# Patient Record
Sex: Female | Born: 1955 | Race: White | Hispanic: No | Smoking: Current every day smoker
Health system: Southern US, Community
[De-identification: ages and names within clinical notes are randomized; demographics above are authoritative.]

## PROBLEM LIST (undated history)

## (undated) DIAGNOSIS — F32A Depression, unspecified: Secondary | ICD-10-CM

## (undated) DIAGNOSIS — I2510 Atherosclerotic heart disease of native coronary artery without angina pectoris: Secondary | ICD-10-CM

## (undated) DIAGNOSIS — M1990 Unspecified osteoarthritis, unspecified site: Secondary | ICD-10-CM

## (undated) DIAGNOSIS — R55 Syncope and collapse: Secondary | ICD-10-CM

## (undated) DIAGNOSIS — Z85048 Personal history of other malignant neoplasm of rectum, rectosigmoid junction, and anus: Secondary | ICD-10-CM

## (undated) DIAGNOSIS — E041 Nontoxic single thyroid nodule: Secondary | ICD-10-CM

## (undated) DIAGNOSIS — I639 Cerebral infarction, unspecified: Secondary | ICD-10-CM

## (undated) DIAGNOSIS — R0789 Other chest pain: Secondary | ICD-10-CM

## (undated) DIAGNOSIS — Z95828 Presence of other vascular implants and grafts: Secondary | ICD-10-CM

## (undated) DIAGNOSIS — R519 Headache, unspecified: Secondary | ICD-10-CM

## (undated) DIAGNOSIS — Z933 Colostomy status: Secondary | ICD-10-CM

## (undated) DIAGNOSIS — I209 Angina pectoris, unspecified: Secondary | ICD-10-CM

## (undated) DIAGNOSIS — Z923 Personal history of irradiation: Secondary | ICD-10-CM

## (undated) DIAGNOSIS — I10 Essential (primary) hypertension: Secondary | ICD-10-CM

## (undated) DIAGNOSIS — Z6824 Body mass index (BMI) 24.0-24.9, adult: Secondary | ICD-10-CM

## (undated) DIAGNOSIS — Z9221 Personal history of antineoplastic chemotherapy: Secondary | ICD-10-CM

## (undated) DIAGNOSIS — K219 Gastro-esophageal reflux disease without esophagitis: Secondary | ICD-10-CM

## (undated) DIAGNOSIS — R51 Headache: Secondary | ICD-10-CM

## (undated) DIAGNOSIS — C20 Malignant neoplasm of rectum: Secondary | ICD-10-CM

## (undated) DIAGNOSIS — Z87440 Personal history of urinary (tract) infections: Secondary | ICD-10-CM

## (undated) DIAGNOSIS — F17200 Nicotine dependence, unspecified, uncomplicated: Secondary | ICD-10-CM

## (undated) HISTORY — DX: Essential (primary) hypertension: I10

## (undated) HISTORY — DX: Nontoxic single thyroid nodule: E04.1

## (undated) HISTORY — PX: PARTIAL HYSTERECTOMY: SHX80

## (undated) HISTORY — DX: Personal history of urinary (tract) infections: Z87.440

## (undated) HISTORY — DX: Nicotine dependence, unspecified, uncomplicated: F17.200

## (undated) HISTORY — PX: COLON SURGERY: SHX602

## (undated) HISTORY — PX: ABDOMINAL HYSTERECTOMY: SHX81

## (undated) HISTORY — DX: Other chest pain: R07.89

## (undated) HISTORY — DX: Personal history of other malignant neoplasm of rectum, rectosigmoid junction, and anus: Z85.048

## (undated) HISTORY — PX: CHOLECYSTECTOMY: SHX55

## (undated) HISTORY — DX: Colostomy status: Z93.3

## (undated) HISTORY — PX: ERCP: SHX60

## (undated) HISTORY — DX: Syncope and collapse: R55

## (undated) HISTORY — PX: BACK SURGERY: SHX140

---
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
## 1981-12-11 HISTORY — PX: CHOLECYSTECTOMY: SHX55

## 1997-12-11 HISTORY — PX: ABDOMINAL HYSTERECTOMY: SHX81

## 1998-12-11 HISTORY — PX: BACK SURGERY: SHX140

## 2001-05-23 ENCOUNTER — Encounter: Payer: Self-pay | Admitting: Neurosurgery

## 2001-05-23 ENCOUNTER — Ambulatory Visit (HOSPITAL_COMMUNITY): Admission: RE | Admit: 2001-05-23 | Discharge: 2001-05-23 | Payer: Self-pay | Admitting: Neurosurgery

## 2001-06-20 ENCOUNTER — Ambulatory Visit (HOSPITAL_COMMUNITY): Admission: RE | Admit: 2001-06-20 | Discharge: 2001-06-21 | Payer: Self-pay | Admitting: Neurosurgery

## 2001-06-20 ENCOUNTER — Encounter: Payer: Self-pay | Admitting: Neurosurgery

## 2005-01-16 ENCOUNTER — Emergency Department: Payer: Self-pay | Admitting: Emergency Medicine

## 2011-07-04 ENCOUNTER — Ambulatory Visit: Payer: Self-pay | Admitting: Family Medicine

## 2012-01-23 ENCOUNTER — Emergency Department: Payer: Self-pay | Admitting: Emergency Medicine

## 2012-01-23 LAB — CBC
HGB: 15.1 g/dL (ref 12.0–16.0)
MCHC: 33.7 g/dL (ref 32.0–36.0)
MCV: 89 fL (ref 80–100)
Platelet: 92 10*3/uL — ABNORMAL LOW (ref 150–440)
RBC: 5.03 10*6/uL (ref 3.80–5.20)
RDW: 14.2 % (ref 11.5–14.5)
WBC: 4.2 10*3/uL (ref 3.6–11.0)

## 2012-01-23 LAB — CSF CELL COUNT WITH DIFFERENTIAL
CSF Tube #: 3
Lymphocytes: 0 %
Other Cells: 0 %
WBC (CSF): 0 /mm3

## 2012-01-23 LAB — BASIC METABOLIC PANEL
Anion Gap: 9 (ref 7–16)
Co2: 29 mmol/L (ref 21–32)
Creatinine: 0.73 mg/dL (ref 0.60–1.30)
EGFR (African American): >60
EGFR (Non-African Amer.): >60
Glucose: 107 mg/dL — ABNORMAL HIGH (ref 65–99)
Potassium: 3.2 mmol/L — ABNORMAL LOW (ref 3.5–5.1)
Sodium: 135 mmol/L — ABNORMAL LOW (ref 136–145)

## 2012-01-23 LAB — TROPONIN I: Troponin-I: <0.02 ng/mL

## 2012-01-27 LAB — CSF CULTURE W GRAM STAIN

## 2012-01-29 LAB — CULTURE, BLOOD (SINGLE)

## 2012-12-13 DIAGNOSIS — R519 Headache, unspecified: Secondary | ICD-10-CM | POA: Insufficient documentation

## 2014-04-29 ENCOUNTER — Emergency Department: Payer: Self-pay | Admitting: Emergency Medicine

## 2014-04-29 ENCOUNTER — Telehealth: Payer: Self-pay

## 2014-04-29 LAB — BASIC METABOLIC PANEL
ANION GAP: 4 — AB (ref 7–16)
BUN: 13 mg/dL (ref 7–18)
CHLORIDE: 104 mmol/L (ref 98–107)
Calcium, Total: 9.6 mg/dL (ref 8.5–10.1)
Co2: 33 mmol/L — ABNORMAL HIGH (ref 21–32)
Creatinine: 0.64 mg/dL (ref 0.60–1.30)
EGFR (African American): >60
EGFR (Non-African Amer.): >60
Glucose: 100 mg/dL — ABNORMAL HIGH (ref 65–99)
OSMOLALITY: 281 (ref 275–301)
Potassium: 3.5 mmol/L (ref 3.5–5.1)
SODIUM: 141 mmol/L (ref 136–145)

## 2014-04-29 LAB — CBC
HCT: 44.4 % (ref 35.0–47.0)
HGB: 14.5 g/dL (ref 12.0–16.0)
MCH: 29.3 pg (ref 26.0–34.0)
MCHC: 32.7 g/dL (ref 32.0–36.0)
MCV: 90 fL (ref 80–100)
PLATELETS: 166 10*3/uL (ref 150–440)
RBC: 4.94 10*6/uL (ref 3.80–5.20)
RDW: 14.2 % (ref 11.5–14.5)
WBC: 8.5 10*3/uL (ref 3.6–11.0)

## 2014-04-29 LAB — TROPONIN I: Troponin-I: <0.02 ng/mL

## 2014-04-29 NOTE — Telephone Encounter (Signed)
Attempted to contact pt regarding visit to [facility] ED today for chest pain.  Left message for pt to call back to schedule new pt appt.

## 2014-05-06 ENCOUNTER — Encounter: Payer: Self-pay | Admitting: Cardiology

## 2014-05-06 ENCOUNTER — Ambulatory Visit (INDEPENDENT_AMBULATORY_CARE_PROVIDER_SITE_OTHER): Payer: BC Managed Care – PPO | Admitting: Cardiology

## 2014-05-06 ENCOUNTER — Encounter (INDEPENDENT_AMBULATORY_CARE_PROVIDER_SITE_OTHER): Payer: Self-pay

## 2014-05-06 VITALS — BP 154/103 | HR 70 | Ht 66.0 in | Wt 155.0 lb

## 2014-05-06 DIAGNOSIS — I10 Essential (primary) hypertension: Secondary | ICD-10-CM

## 2014-05-06 DIAGNOSIS — R000 Tachycardia, unspecified: Secondary | ICD-10-CM

## 2014-05-06 DIAGNOSIS — R0789 Other chest pain: Secondary | ICD-10-CM

## 2014-05-06 DIAGNOSIS — R079 Chest pain, unspecified: Secondary | ICD-10-CM

## 2014-05-06 DIAGNOSIS — F17200 Nicotine dependence, unspecified, uncomplicated: Secondary | ICD-10-CM

## 2014-05-06 NOTE — Patient Instructions (Signed)
Your physician has requested that you have a stress echocardiogram. For further information please visit [email or web address]. Please follow instruction sheet as given.  Your physician recommends that you schedule a follow-up appointment in:  4-6 weeks with Dr. Ellyn Hack

## 2014-05-08 ENCOUNTER — Encounter: Payer: Self-pay | Admitting: Cardiology

## 2014-05-08 DIAGNOSIS — Z87891 Personal history of nicotine dependence: Secondary | ICD-10-CM | POA: Insufficient documentation

## 2014-05-08 DIAGNOSIS — R079 Chest pain, unspecified: Secondary | ICD-10-CM | POA: Insufficient documentation

## 2014-05-08 DIAGNOSIS — R000 Tachycardia, unspecified: Secondary | ICD-10-CM | POA: Insufficient documentation

## 2014-05-08 DIAGNOSIS — I10 Essential (primary) hypertension: Secondary | ICD-10-CM | POA: Insufficient documentation

## 2014-05-08 NOTE — Assessment & Plan Note (Addendum)
She has been able to Quit in the past, but has always had a relapse. Counseling given - QuitNow information provided.

## 2014-05-08 NOTE — Assessment & Plan Note (Signed)
Probably exercise mediated. No syncope.

## 2014-05-08 NOTE — Assessment & Plan Note (Signed)
Based upon readings from home - BPs are borderline to elevated.   Will need to address, but after assessment for ischmia.

## 2014-05-08 NOTE — Assessment & Plan Note (Signed)
-   Both typical & atypical features for Angina.  Prolonged episodes & now with fatigue - ruled out for MI though.  Plan: TM Stress Echo - to allow measure baseline cardiac function & evaluate for ischemic vs. Structural abnormalities.

## 2014-05-08 NOTE — Progress Notes (Signed)
PATIENT: Rachel [name] MRN: [id number] DOB: 08/30/1956 PCP: Lynnell Jude, MD  Clinic Note: Chief Complaint  Patient presents with  . OTHER    F/u from [facility] c/o chest discomfort, rapid heart beat, sob and elevated BP. Meds reviewed verbally with pt.    HPI: Rachel [name] is a 58 y.o. female with a PMH below -- HTN & smoking who presents today for evaluation of chest pain.  Interval History: Rachel [name] is referred for cardiology evaluation following an ER visit to [facility] ER with CP on 04/29/2014. She describes having brief episodes of a ~moderate sensation of "squeezing" discomfort in her chest (Sub sternal radiating to L arm & jaw) that happens sporadically and resolves spontaneously.  The episodes are not associated with any particular activity - happening both @ rest & with exertion.  Until the 20th, the "spells" were relatively short-lived, but on that day, the discomfort did not go away after > 1 hr and she noted having a bit of SOB associated with it.  It hurt more to take a deep breath.  The duration (over 1 hr) & association with dyspnea is made her decide to go to the ER.  She was r/o for MI, but noted to be significantly hypertensive -- a new finding for her.   Since that episode, she has felt "totally drained" & fatigued.  She notes decreased exercise tolerance - but is not sure if this is a real symptom or simply being scared.  She has not had any of the chest discomfort symptoms in ~2-3 days now.  BPs have been elevated more since the episodes -- 120s-160s; has also noted HR will go up & down more.  CV ROS: No PND, orthopnea or edema. No palpitations, lightheadedness, dizziness, weakness or syncope/near syncope. No TIA/amaurosis fugax symptoms. No melena, hematochezia, hematuria, or epstaxis. No claudication.  Past Medical History  Diagnosis Date  . Syncope and collapse   . UTI (urinary tract infection)     Treated  . Hypertension   . Current every day smoker     smokeer for ~35  years - > has cut down to < 3/4 ppd    Prior Cardiac Evaluation and Past Surgical History: Past Surgical History  Procedure Laterality Date  . Cholecystectomy    . Partial hysterectomy    . Back surgery      No Known Allergies  No current outpatient prescriptions on file.   No current facility-administered medications for this visit.    History   Social History Narrative   Married.   Works for [employer].   Smokes ~3/4 ppd    family history includes Heart disease in her paternal grandmother; Hyperlipidemia in her father.  ROS: A comprehensive Review of Systems - Negative except GERD symptoms - but her current symptoms are not at all similar to her GERD.  she also notes a swollen "gland" on her R neck.    PHYSICAL EXAM BP 154/103  Pulse 70  Ht 5\' 6"  (1.676 m)  Wt 155 lb (70.308 kg)  BMI 25.03 kg/m2 General appearance: alert, cooperative, appears stated age, no distress and well nourished; healthy appearing; Normal Mood & afffect. HEENT: [place]/AT, EOMI, MMM, anicteric sclera Neck: no carotid bruit, no JVD, supple, symmetrical, trachea midline and R side mid neck - unsue if Node vs. thyroid nodule - 0.5-1 cm swollen & tender lump. Lungs: clear to auscultation bilaterally, normal percussion bilaterally and non-labored, good air movement Heart: regular rate and rhythm, S1, S2  normal, no murmur, click, rub or gallop, normal apical impulse and occasional ectopy Abdomen: soft, non-tender; bowel sounds normal; no masses,  no organomegaly Extremities: extremities normal, atraumatic, no cyanosis or edema - mild varicose veins without stasis dermatitis changes Pulses: 2+ and symmetric Neurologic: Alert and oriented X 3, normal strength and tone. Normal symmetric reflexes. Normal coordination and gait   Adult ECG Report  Rate: 70 ;  Rhythm: normal sinus rhythm; LAD (-36) - otherwise normal.  Recent Labs: Labs from [place] normal.  ASSESSMENT / PLAN: 58 y/o woman  with CRFs of HTN & smoking referred for chest "pain" that has both typical & atypical features.   Chest pain with moderate risk for cardiac etiology - Both typical & atypical features for Angina.  Prolonged episodes & now with fatigue - ruled out for MI though.  Plan: TM Stress Echo - to allow measure baseline cardiac function & evaluate for ischemic vs. Structural abnormalities.  Hypertension Based upon readings from home - BPs are borderline to elevated.   Will need to address, but after assessment for ischmia.  Current every day smoker She has been able to Quit in the past, but has always had a relapse. Counseling given - QuitNow information provided.  Rapid heart beat Probably exercise mediated. No syncope.    Orders Placed This Encounter  Procedures  . EKG 12-Lead    Order Specific Question:  Where should this test be performed    Answer:  [facility]  . Echocardiogram stress test without contrast    Standing Status: Future     Number of Occurrences:      Standing Expiration Date: 05/06/2015    Order Specific Question:  Type of Echo    Answer:  Complete    Order Specific Question:  Where should this test be performed    Answer:  [facility]    Order Specific Question:  Reason for exam-Echo    Answer:  Chest Pain  786.50   No orders of the defined types were placed in this encounter.    Followup: 1 months  Gerldine Suleiman W. Ellyn Hack, M.D., M.S. Interventional Cardiolgy [facility]

## 2014-05-21 ENCOUNTER — Other Ambulatory Visit (INDEPENDENT_AMBULATORY_CARE_PROVIDER_SITE_OTHER): Payer: BC Managed Care – PPO

## 2014-05-21 DIAGNOSIS — R079 Chest pain, unspecified: Secondary | ICD-10-CM

## 2014-05-29 NOTE — Progress Notes (Signed)
Quick Note:  Exercise Echo ST - god effort. The test was abnormal though & suggests possible anterior wall ischemia.  Probably need to see back soon & set up for cath. If amenable, can have her come see NP/PA in order to discuss result & plan for cath. (I know that my time there is fleeting) This distribution of possible ischemia is concerning for LAD disease & warrants Cath.  Thanks, Leonie Man, MD  ______

## 2014-06-02 ENCOUNTER — Emergency Department: Payer: Self-pay | Admitting: Emergency Medicine

## 2014-06-03 ENCOUNTER — Encounter: Payer: Self-pay | Admitting: Nurse Practitioner

## 2014-06-03 ENCOUNTER — Ambulatory Visit (INDEPENDENT_AMBULATORY_CARE_PROVIDER_SITE_OTHER): Payer: BC Managed Care – PPO | Admitting: Nurse Practitioner

## 2014-06-03 VITALS — BP 178/100 | HR 65 | Ht 66.0 in | Wt 159.0 lb

## 2014-06-03 DIAGNOSIS — F17200 Nicotine dependence, unspecified, uncomplicated: Secondary | ICD-10-CM

## 2014-06-03 DIAGNOSIS — I10 Essential (primary) hypertension: Secondary | ICD-10-CM

## 2014-06-03 DIAGNOSIS — R072 Precordial pain: Secondary | ICD-10-CM

## 2014-06-03 DIAGNOSIS — R079 Chest pain, unspecified: Secondary | ICD-10-CM

## 2014-06-03 DIAGNOSIS — R0789 Other chest pain: Secondary | ICD-10-CM

## 2014-06-03 DIAGNOSIS — Z720 Tobacco use: Secondary | ICD-10-CM

## 2014-06-03 MED ORDER — NITROGLYCERIN 0.4 MG SL SUBL: 0.4000 mg | SUBLINGUAL_TABLET | SUBLINGUAL | Status: AC | PRN

## 2014-06-03 MED ORDER — METOPROLOL TARTRATE 12.5 MG HALF TABLET: 12.5000 mg | ORAL_TABLET | Freq: Two times a day (BID) | ORAL | Status: DC

## 2014-06-03 MED ORDER — METOPROLOL TARTRATE 25 MG PO TABS: 12.5000 mg | ORAL_TABLET | Freq: Two times a day (BID) | ORAL | Status: DC

## 2014-06-03 MED ORDER — ISOSORBIDE MONONITRATE ER 30 MG PO TB24: 30.0000 mg | ORAL_TABLET | Freq: Every day | ORAL | Status: DC

## 2014-06-03 MED ORDER — METOPROLOL TARTRATE 12.5 MG HALF TABLET: 25.0000 mg | ORAL_TABLET | Freq: Two times a day (BID) | ORAL | Status: DC

## 2014-06-03 NOTE — Progress Notes (Signed)
Patient Name: Rachel [name] Date of Encounter: 06/03/2014  Primary Care Provider:  Lynnell Jude, MD Primary Cardiologist:  Roni Bread   Patient Profile  58 y/o female with h/o chest pain dating back to May, who recently had an abnl stress echo and presents for clinic f/u.  Problem List   Past Medical History  Diagnosis Date  . Syncope and collapse   . History of UTI   . Hypertension   . Current every day smoker     a. ~35 years - > has cut down to < 3/4 ppd  . Midsternal chest pain     a. 05/2014 Stress Echo: Ex time: 8:09, ECG w/o acute changes, EF nl with possible mid and apical anterior HK-->cath recommended.   Past Surgical History  Procedure Laterality Date  . Cholecystectomy    . Partial hysterectomy    . Back surgery      Allergies  No Known Allergies  HPI  59 y/o female with the above problem list.  She has a h/o of brief, intermittent rest and exertional midsternal chest pain and squeezing radiating to the jaw and left arm, typically resolving spontaneously.  On 5/20, she had a more prolonged episode associated with dyspnea and presented to the [facility] ED where ECG was non-acute and troponin was normal.  She was hypertensive in the ED.  She was discharged home and followed up with Dr. Ellyn Hack on 5/27.  Recommendation was made for stress echo, which was performed on 6/11, revealing good exercise tolerance and normal LV function though mid and apical anterior hypokinesis with exercise could not be ruled out.  As a result, she presents for clinic f/u today to arrange for diagnostic catheterization.  Since her visit with Dr. Ellyn Hack, she has continued to have intermittent rest and exertional chest discomfort most days of the week.  Ss typically last about 5 mins and resolve spontaneously.  She has been following her BP @ home has been elevated with systolics typically in the 150's to 170's.  Yesterday she fell and had swelling of her left hand.  She was seen in the ED and  found to have a hamate bone fracture.  She was placed on pain medication and is awaiting ortho f/u.  She denies chest pain, palpitations, dyspnea, pnd, orthopnea, n, v, dizziness, syncope, edema, weight gain, or early satiety.   Home Medications  Prior to Admission medications   Not on File   Family History  Family History  Problem Relation Age of Onset  . Hyperlipidemia Father   . Heart disease Paternal Grandmother    Social History  History   Social History  . Marital Status: Married    Spouse Name: N/A    Number of Children: N/A  . Years of Education: N/A   Occupational History  . Not on file.   Social History Main Topics  . Smoking status: Current Every Day Smoker -- 0.25 packs/day for 35 years    Types: Cigarettes  . Smokeless tobacco: Not on file  . Alcohol Use: Yes     Comment: occasional  . Drug Use: No  . Sexual Activity: Not on file   Other Topics Concern  . Not on file   Social History Narrative   Married.   Works for [employer].   Smokes ~3/4 ppd     Review of Systems General:  No chills, fever, night sweats or weight changes.  Cardiovascular:  +++ chest pain as outlined above.  No dyspnea on exertion, edema, orthopnea, palpitations, paroxysmal nocturnal dyspnea. Dermatological: No rash, lesions/masses Respiratory: No cough, dyspnea Urologic: No hematuria, dysuria Abdominal:   No nausea, vomiting, diarrhea, bright red blood per rectum, melena, or hematemesis Neurologic:  No visual changes, wkns, changes in mental status. MSK:  Left hand swelling and pain s/p fall and fx. All other systems reviewed and are otherwise negative except as noted above.  Physical Exam  Blood pressure 178/100, pulse 65, height 5\' 6"  (1.676 m), weight 159 lb (72.122 kg).  General: Pleasant, NAD Psych: Normal affect. Neuro: Alert and oriented X 3. Moves all extremities spontaneously. HEENT: Normal  Neck: Supple without bruits or JVD. Lungs:  Resp regular and  unlabored, CTA. Heart: RRR no s3, s4, or murmurs. Abdomen: Soft, non-tender, non-distended, BS + x 4.  Extremities: No clubbing, cyanosis or edema. DP/PT/Radials 2+ and equal bilaterally.  No femoral bruits noted.  Left hand/forearm in soft splint.  Accessory Clinical Findings  ECG - RSR, 65, left axis, no acute st/t changes.  Assessment & Plan  1.  Midsternal chest pain:  Pt presents with a several month h/o intermittent rest and exertional midsternal chest discomfort w/o associated Ss, lasting ~ 5 mins and resolving spontaneously.  She recently underwent stress echo, which showed mid and apical anterior hypokinesis with exercise. We discussed pursuing diagnostic catheterization today. The patient understands that risks include but are not limited to stroke (1 in 1000), death (1 in 30), kidney failure [usually temporary] (1 in 500), bleeding (1 in 200), allergic reaction [possibly serious] (1 in 200), and agrees to proceed. I will add asa 81mg  daily, lopressor 12.5mg  bid, imdur 30mg  daily, and ntg sl 0.4mg  [place] prn chest pain.  She will be scheduled for cath for next Tuesday with Dr. Rockey Situ.  2.  HTN:  BP elevated in clinic today.  She says that it has been running in the 150's to 170's @ home.  Given chest pain, I will add low-dose bb (HR in mid-60's) and imdur first.  Pending cath results, we will reconsider her regimen.  She will continue to take her blood pressures daily and track @ home.  3.  Tobacco Abuse:  Complete cessation advised.  She says that she has cut way back and has only smoked one cigarette today.  4.  L Hamate bone fx:  Pending further ortho eval.  5.  Dispo:  Will obtain cbc, bmet, coags today in preparation for cath on Tuesday.  She had a cxr in May @ [facility].  F/U ~ 2 wks post-cath.  Murray Hodgkins, NP 06/03/2014, 2:09 PM

## 2014-06-03 NOTE — Patient Instructions (Addendum)
Please start Lopressor 12.5mg  twice daily Please start Imdur 30mg  once daily Please use nitro as needed for chest pain   Your physician recommends that you schedule a follow-up appointment in: 3 weeks  [facility] Cardiac Cath Instructions   You are scheduled for a Cardiac Cath on:____Tuesday, June 30_________  Please arrive at __7:30___am on the day of your procedure  You will need to pre-register prior to the day of your procedure.  Enter through the [employer] at [facility].  Registration is the first desk on your right.  Please take the procedure order we have given you in order to be registered appropriately  Do not eat/drink anything after midnight  Someone will need to drive you home  It is recommended someone be with you for the first 24 hours after your procedure  Wear clothes that are easy to get on/off and wear slip on shoes if possible  Medications bring a current list of all medications with you  _X_ You may take all of your medications the morning of your procedure with enough water to swallow safely  Day of your procedure: Arrive at the [place] entrance.  Free valet service is available.  After entering the [place] please check-in at the registration desk (1st desk on your right) to receive your armband. After receiving your armband someone will escort you to the cardiac cath/special procedures waiting area.  The usual length of stay after your procedure is about 2 to 3 hours.  This can vary.  If you have any questions, please call our office at [phone], or you may call the cardiac cath lab at [facility] directly at [phone]

## 2014-06-04 LAB — CBC WITH DIFFERENTIAL
Basophils Absolute: 0 10*3/uL (ref 0.0–0.2)
Basos: 0 %
Eos: 1 %
Eosinophils Absolute: 0.1 10*3/uL (ref 0.0–0.4)
HCT: 39 % (ref 34.0–46.6)
Hemoglobin: 13.2 g/dL (ref 11.1–15.9)
Immature Grans (Abs): 0 10*3/uL (ref 0.0–0.1)
Immature Granulocytes: 0 %
Lymphocytes Absolute: 2.4 10*3/uL (ref 0.7–3.1)
Lymphs: 33 %
MCH: 29.9 pg (ref 26.6–33.0)
MCHC: 33.8 g/dL (ref 31.5–35.7)
MCV: 88 fL (ref 79–97)
Monocytes Absolute: 0.4 10*3/uL (ref 0.1–0.9)
Monocytes: 5 %
Neutrophils Absolute: 4.4 10*3/uL (ref 1.4–7.0)
Neutrophils Relative %: 61 %
Platelets: 179 10*3/uL (ref 150–379)
RBC: 4.42 x10E6/uL (ref 3.77–5.28)
RDW: 14.4 % (ref 12.3–15.4)
WBC: 7.2 10*3/uL (ref 3.4–10.8)

## 2014-06-04 LAB — PROTIME-INR
INR: 1 (ref 0.8–1.2)
PROTHROMBIN TIME: 10 s (ref 9.1–12.0)

## 2014-06-05 ENCOUNTER — Telehealth: Payer: Self-pay

## 2014-06-05 LAB — BASIC METABOLIC PANEL: Calcium: 9.2 mg/dL (ref 8.7–10.2)

## 2014-06-05 NOTE — Telephone Encounter (Signed)
Spoke w/ pt.  Advised her that I would need to resched her cath for a day when Dr. Rockey Situ and Dr. Fletcher Anon are both in the hospital.  She is agreeable. Cath resched for 06/19/14 @ 8:30. Pt to call w/ further questions or concerns.

## 2014-06-10 NOTE — Telephone Encounter (Signed)
This encounter was created in error - please disregard.

## 2014-06-11 ENCOUNTER — Ambulatory Visit: Payer: BC Managed Care – PPO | Admitting: Nurse Practitioner

## 2014-06-19 ENCOUNTER — Ambulatory Visit: Payer: Self-pay | Admitting: Cardiovascular Disease

## 2014-06-19 DIAGNOSIS — I2510 Atherosclerotic heart disease of native coronary artery without angina pectoris: Secondary | ICD-10-CM

## 2014-06-24 ENCOUNTER — Ambulatory Visit: Payer: BC Managed Care – PPO | Admitting: Cardiology

## 2014-06-29 ENCOUNTER — Ambulatory Visit: Payer: BC Managed Care – PPO | Admitting: Cardiovascular Disease

## 2014-07-08 ENCOUNTER — Ambulatory Visit: Payer: BC Managed Care – PPO | Admitting: Cardiovascular Disease

## 2014-07-09 ENCOUNTER — Encounter: Payer: Self-pay | Admitting: Cardiovascular Disease

## 2014-07-22 ENCOUNTER — Inpatient Hospital Stay: Payer: Self-pay | Admitting: Internal Medicine

## 2014-07-22 DIAGNOSIS — E876 Hypokalemia: Secondary | ICD-10-CM

## 2014-07-22 DIAGNOSIS — K8050 Calculus of bile duct without cholangitis or cholecystitis without obstruction: Secondary | ICD-10-CM

## 2014-07-22 DIAGNOSIS — R079 Chest pain, unspecified: Secondary | ICD-10-CM

## 2014-07-22 LAB — COMPREHENSIVE METABOLIC PANEL
ALBUMIN: 3.3 g/dL — AB (ref 3.4–5.0)
ALT: 197 U/L — AB
ANION GAP: 10 (ref 7–16)
AST: 488 U/L — AB (ref 15–37)
Alkaline Phosphatase: 217 U/L — ABNORMAL HIGH
BUN: 11 mg/dL (ref 7–18)
Bilirubin,Total: 1.6 mg/dL — ABNORMAL HIGH (ref 0.2–1.0)
Calcium, Total: 8.8 mg/dL (ref 8.5–10.1)
Chloride: 103 mmol/L (ref 98–107)
Co2: 30 mmol/L (ref 21–32)
Creatinine: 0.84 mg/dL (ref 0.60–1.30)
EGFR (African American): >60
Glucose: 162 mg/dL — ABNORMAL HIGH (ref 65–99)
Osmolality: 288 (ref 275–301)
Potassium: 2.7 mmol/L — ABNORMAL LOW (ref 3.5–5.1)
Sodium: 143 mmol/L (ref 136–145)
TOTAL PROTEIN: 7.5 g/dL (ref 6.4–8.2)

## 2014-07-22 LAB — CK TOTAL AND CKMB (NOT AT ARMC)
CK, TOTAL: 107 U/L
CK, TOTAL: 85 U/L
CK, Total: 131 U/L
CK-MB: 1.3 ng/mL (ref 0.5–3.6)
CK-MB: 0.6 ng/mL (ref 0.5–3.6)
CK-MB: 0.7 ng/mL (ref 0.5–3.6)

## 2014-07-22 LAB — CBC
HCT: 43.1 % (ref 35.0–47.0)
HGB: 13.9 g/dL (ref 12.0–16.0)
MCH: 29.2 pg (ref 26.0–34.0)
MCHC: 32.4 g/dL (ref 32.0–36.0)
MCV: 90 fL (ref 80–100)
Platelet: 232 10*3/uL (ref 150–440)
RBC: 4.77 10*6/uL (ref 3.80–5.20)
RDW: 14.2 % (ref 11.5–14.5)
WBC: 18.5 10*3/uL — AB (ref 3.6–11.0)

## 2014-07-22 LAB — PROTIME-INR
INR: 1
Prothrombin Time: 12.9 secs (ref 11.5–14.7)

## 2014-07-22 LAB — TROPONIN I
Troponin-I: <0.02 ng/mL
Troponin-I: 0.06 ng/mL — ABNORMAL HIGH
Troponin-I: 0.05 ng/mL

## 2014-07-22 LAB — LIPASE, BLOOD: Lipase: 174 U/L (ref 73–393)

## 2014-07-22 LAB — APTT: Activated PTT: 27.2 secs (ref 23.6–35.9)

## 2014-07-22 LAB — PRO B NATRIURETIC PEPTIDE: B-Type Natriuretic Peptide: 1509 pg/mL — ABNORMAL HIGH (ref 0–125)

## 2014-07-23 LAB — CBC WITH DIFFERENTIAL/PLATELET
BASOS ABS: 0 10*3/uL (ref 0.0–0.1)
Basophil %: 0.3 %
EOS ABS: 0 10*3/uL (ref 0.0–0.7)
Eosinophil %: 0.1 %
HCT: 35.9 % (ref 35.0–47.0)
HGB: 11.6 g/dL — ABNORMAL LOW (ref 12.0–16.0)
Lymphocyte #: 0.9 10*3/uL — ABNORMAL LOW (ref 1.0–3.6)
Lymphocyte %: 6.7 %
MCH: 29.4 pg (ref 26.0–34.0)
MCHC: 32.4 g/dL (ref 32.0–36.0)
MCV: 91 fL (ref 80–100)
Monocyte #: 0.8 x10 3/mm (ref 0.2–0.9)
Monocyte %: 5.5 %
NEUTROS ABS: 12.2 10*3/uL — AB (ref 1.4–6.5)
NEUTROS PCT: 87.4 %
Platelet: 175 10*3/uL (ref 150–440)
RBC: 3.97 10*6/uL (ref 3.80–5.20)
RDW: 14.3 % (ref 11.5–14.5)
WBC: 14 10*3/uL — ABNORMAL HIGH (ref 3.6–11.0)

## 2014-07-23 LAB — COMPREHENSIVE METABOLIC PANEL
ALK PHOS: 180 U/L — AB
ANION GAP: 6 — AB (ref 7–16)
Albumin: 2.5 g/dL — ABNORMAL LOW (ref 3.4–5.0)
BILIRUBIN TOTAL: 4.1 mg/dL — AB (ref 0.2–1.0)
BUN: 10 mg/dL (ref 7–18)
Calcium, Total: 7.9 mg/dL — ABNORMAL LOW (ref 8.5–10.1)
Chloride: 104 mmol/L (ref 98–107)
Co2: 32 mmol/L (ref 21–32)
Creatinine: 0.78 mg/dL (ref 0.60–1.30)
EGFR (African American): >60
Glucose: 89 mg/dL (ref 65–99)
Osmolality: 282 (ref 275–301)
Potassium: 2.7 mmol/L — ABNORMAL LOW (ref 3.5–5.1)
SGOT(AST): 231 U/L — ABNORMAL HIGH (ref 15–37)
SGPT (ALT): 204 U/L — ABNORMAL HIGH
SODIUM: 142 mmol/L (ref 136–145)
Total Protein: 6 g/dL — ABNORMAL LOW (ref 6.4–8.2)

## 2014-07-23 LAB — TSH: Thyroid Stimulating Horm: 0.22 u[IU]/mL — ABNORMAL LOW

## 2014-07-23 LAB — POTASSIUM: Potassium: 2.8 mmol/L — ABNORMAL LOW (ref 3.5–5.1)

## 2014-07-23 LAB — LIPID PANEL
CHOLESTEROL: 94 mg/dL (ref 0–200)
HDL Cholesterol: 44 mg/dL (ref 40–60)
Ldl Cholesterol, Calc: 39 mg/dL (ref 0–100)
Triglycerides: 56 mg/dL (ref 0–200)
VLDL Cholesterol, Calc: 11 mg/dL (ref 5–40)

## 2014-07-23 LAB — MAGNESIUM: Magnesium: 1.8 mg/dL

## 2014-07-24 LAB — BASIC METABOLIC PANEL
Anion Gap: 8 (ref 7–16)
BUN: 11 mg/dL (ref 7–18)
Calcium, Total: 7.9 mg/dL — ABNORMAL LOW (ref 8.5–10.1)
Chloride: 110 mmol/L — ABNORMAL HIGH (ref 98–107)
Co2: 26 mmol/L (ref 21–32)
Creatinine: 0.7 mg/dL (ref 0.60–1.30)
EGFR (African American): >60
Glucose: 79 mg/dL (ref 65–99)
OSMOLALITY: 285 (ref 275–301)
POTASSIUM: 4.2 mmol/L (ref 3.5–5.1)
SODIUM: 144 mmol/L (ref 136–145)

## 2014-07-24 LAB — HEPATIC FUNCTION PANEL A (ARMC)
ALK PHOS: 166 U/L — AB
Albumin: 2.4 g/dL — ABNORMAL LOW (ref 3.4–5.0)
BILIRUBIN DIRECT: 2 mg/dL — AB (ref 0.00–0.20)
Bilirubin,Total: 2.8 mg/dL — ABNORMAL HIGH (ref 0.2–1.0)
SGOT(AST): 102 U/L — ABNORMAL HIGH (ref 15–37)
SGPT (ALT): 125 U/L — ABNORMAL HIGH
Total Protein: 5.8 g/dL — ABNORMAL LOW (ref 6.4–8.2)

## 2014-07-24 LAB — T4, FREE: Free Thyroxine: 1.22 ng/dL (ref 0.76–1.46)

## 2014-07-25 LAB — COMPREHENSIVE METABOLIC PANEL
ALBUMIN: 2.6 g/dL — AB (ref 3.4–5.0)
AST: 96 U/L — AB (ref 15–37)
Alkaline Phosphatase: 189 U/L — ABNORMAL HIGH
Anion Gap: 7 (ref 7–16)
BILIRUBIN TOTAL: 2 mg/dL — AB (ref 0.2–1.0)
BUN: 8 mg/dL (ref 7–18)
CHLORIDE: 107 mmol/L (ref 98–107)
CO2: 30 mmol/L (ref 21–32)
CREATININE: 0.69 mg/dL (ref 0.60–1.30)
Calcium, Total: 8.4 mg/dL — ABNORMAL LOW (ref 8.5–10.1)
EGFR (African American): >60
EGFR (Non-African Amer.): >60
Glucose: 87 mg/dL (ref 65–99)
Osmolality: 285 (ref 275–301)
Potassium: 3.7 mmol/L (ref 3.5–5.1)
SGPT (ALT): 123 U/L — ABNORMAL HIGH
Sodium: 144 mmol/L (ref 136–145)
TOTAL PROTEIN: 6.5 g/dL (ref 6.4–8.2)

## 2014-07-25 LAB — CBC WITH DIFFERENTIAL/PLATELET
Basophil #: 0 10*3/uL (ref 0.0–0.1)
Basophil %: 0.5 %
EOS ABS: 0 10*3/uL (ref 0.0–0.7)
Eosinophil %: 0.5 %
HCT: 34.6 % — ABNORMAL LOW (ref 35.0–47.0)
HGB: 11.7 g/dL — AB (ref 12.0–16.0)
LYMPHS ABS: 1.2 10*3/uL (ref 1.0–3.6)
Lymphocyte %: 19 %
MCH: 30.5 pg (ref 26.0–34.0)
MCHC: 33.8 g/dL (ref 32.0–36.0)
MCV: 90 fL (ref 80–100)
MONO ABS: 0.4 x10 3/mm (ref 0.2–0.9)
MONOS PCT: 6.6 %
Neutrophil #: 4.8 10*3/uL (ref 1.4–6.5)
Neutrophil %: 73.4 %
Platelet: 132 10*3/uL — ABNORMAL LOW (ref 150–440)
RBC: 3.83 10*6/uL (ref 3.80–5.20)
RDW: 14.3 % (ref 11.5–14.5)
WBC: 6.6 10*3/uL (ref 3.6–11.0)

## 2014-08-06 DIAGNOSIS — I2510 Atherosclerotic heart disease of native coronary artery without angina pectoris: Secondary | ICD-10-CM | POA: Insufficient documentation

## 2014-08-06 DIAGNOSIS — K8020 Calculus of gallbladder without cholecystitis without obstruction: Secondary | ICD-10-CM | POA: Insufficient documentation

## 2014-09-15 ENCOUNTER — Ambulatory Visit: Payer: Self-pay | Admitting: Gastroenterology

## 2014-09-28 ENCOUNTER — Encounter: Payer: Self-pay | Admitting: Cardiovascular Disease

## 2014-10-16 ENCOUNTER — Encounter: Payer: Self-pay | Admitting: Cardiovascular Disease

## 2015-04-03 NOTE — Consult Note (Signed)
PATIENT NAME:  Rachel [name], Rachel [name] MR#:  [id number] DATE OF BIRTH:  [birth year]  DATE OF CONSULTATION:  07/22/2014  REFERRING PHYSICIAN:   CONSULTING PHYSICIAN:  Manya Silvas, MD  HISTORY OF PRESENT ILLNESS: The patient is a 59 year old white female who had the onset of abdominal pain, nausea, vomiting, chills, palpitations, came to the ER for evaluation and was found to have a common duct stone with dilated intrahepatic and extrahepatic ducts on CAT scan, was admitted to the hospital, started on IV antibiotics, IV fluids and potassium supplementation. I was asked to see her in consultation.   The patient has a history of coronary artery disease, followed by Dr. Rockey Situ. She had 50% LAD and 50% ramus intermedius with some blockage on the catheterization done a few months ago. This was felt best treated by medical therapy and she has been on Imdur, aspirin and metoprolol.   She has a 35-pack-year history of smoking, smokes about 3/4 pack a day now. Her catheterization was done on 06/19/2014, only about a month ago.   PAST MEDICAL HISTORY:  1.  History of hypertension.  2.  Coronary artery disease.  3.  GERD.  4.  History of syncope.   PAST SURGICAL HISTORY: Gallbladder surgery, back surgery, partial hysterectomy.   ALLERGIES: No known drug allergies.   MEDICATIONS: Omeprazole 20 mg a day, metoprolol 25 mg 1/2 tablet b.i.d., Imdur 30 mg a day, aspirin 81 mg a day.   HABITS: Drinks occasionally. smokes a 3/4 pack a day. No other drug use.   FAMILY HISTORY: Positive for gallbladder cancer and a grandmother with heart trouble.  REVIEW OF SYSTEMS: The patient denies any chest pain. She does have abdominal pain. No hematemesis. No hematochezia. No melena. No asthma, wheezing or emphysema.   PHYSICAL EXAMINATION:  GENERAL: White female in no acute distress.  HEENT: Sclerae nonicteric. Conjunctivae negative. Tongue negative. Head is atraumatic. CHEST: Clear.  HEART: No murmurs or gallops I  can hear.  ABDOMEN: Bowel sounds are present. She is definitely tender across the abdomen, especially in the epigastric and right upper quadrant areas.  SKIN: Warm and dry.  PSYCHIATRIC: Mood and affect are appropriate. VITAL SIGNS: Temperature 99.5, pulse 82, respirations 18, blood pressure 124/72.   LABORATORY DATA: BNP 1509, AST 488, potassium 2.7 on admission. White blood count 18.5, total bilirubin 1.6.   CAT scan shows dilated intrahepatic and extrahepatic ducts and a common duct stone.   ASSESSMENT: Common bile duct blockage with stone in a patient with previous gallbladder removal. Also has a history of coronary artery disease and has a mildly elevated BNP.   PLAN: Talk to Dr. Lucilla Lame tomorrow about ERCP and stone removal. Explained to the patient how this would be done. She is in agreement with proceeding with this.   ____________________________ Manya Silvas, MD rte:TT D: 07/22/2014 19:04:02 ET T: 07/22/2014 19:27:08 ET JOB#: [id number]  cc: Manya Silvas, MD, <Dictator> Manya Silvas MD ELECTRONICALLY SIGNED 08/09/2014 13:00

## 2015-04-03 NOTE — Consult Note (Signed)
Note from Dr. Allen Norris NP noted.  K up to 2.8, needs supplementation, Mg ok.  Hopefully can do ERCP tomorrow.   Electronic Signatures: Manya Silvas (MD)  (Signed on 13-Aug-15 16:51)  Authored  Last Updated: 13-Aug-15 16:51 by Manya Silvas (MD)

## 2015-04-03 NOTE — Consult Note (Signed)
General Aspect 59 y/o F with h/o nonobstructive CAD (cath 06/19/2014 with LAD 50% diffuse stenosis and ramus intermedius with 50% stenosis in the proximal third), syncope and collapse, HTN, and current everyday smoker (35 pack years, has cut down to 3/4 pack/day) who presents to [facility] ED today with complaint of substernal chest pain radiating into her abdomen and RUQ since this morning.  ________________________________   Present Illness 59 y/o F with the above problem list who presents to [facility] ED today with complaint of substernal chest pain radiating down into her abdomen and RUQ since this morning when she woke up. Patient recently undwerwent cardiac cath on 06/19/2014 secondary to chest pain dating back to May. Cath revealed nonobstructive coronary disease (50% diffuse stenosis LAD and ramius intermedius with 50% stenosis in the proximal third.) Case was reviewed by Drs Rockey Situ and Fletcher Anon who agreed upon medical management. She was continued on her outpatient medications, including imdur 30 mg and lopressor 12.5 mg bid. She reports having some chronic fatigue, generalized weakness, and SOB since this procedure but has tolerated this without much issue. She did develop an episode 2 weeks ago of substernal chest pain that radiated down to her abdomen but did not seek medical attention.  Today she states after waking and getting ready for work she began to develop substernal chest pain that radiated to her abdomen and RUQ/flank. Pain was associated with nausea, vomiting, palpitations, and chills. No diaphoresis. Pain lasted until she arrived in the ER and received nitro and morphine. She denies any recent fevers. She did have a UTI several weeks ago that was treated by outside clinic. She is s/p cholecystectomy.   In the ED she was found to have BNP: 1509, K+ 2.7, alk phos 217, AST 488, ALT 197, T bili 1.6, Alb 3.3 troponin <0.02, WBC count 18.5, CXR no acute cardiopulmonary process, EKG: NSR 65, TWI  inferolateral leads (possibly 2/2 hypokalemia given her recent nonobstructive cath and K+ of 2.7.) CT scan with choledocholithiasis. Stone lies within the distal CBD and there is intra and extraheptic bile duct dilation. Recommend ERCP.   Physical Exam:  GEN well developed, well nourished, no acute distress   HEENT hearing intact to voice, moist oral mucosa   NECK supple   RESP normal resp effort  clear BS   CARD Regular rate and rhythm  Normal, S1, S2  Murmur   Murmur Systolic  2/6 RUSB   ABD positive tenderness  soft  normal BS  diffuse TTP, appears to be greater along RUQ. negative CVA TTP bilaterally   LYMPH negative neck   EXTR negative edema   SKIN normal to palpation   NEURO cranial nerves intact   PSYCH alert, A+O to time, place, person, good insight   Review of Systems:  Subjective/Chief Complaint ABD pain   General: Fatigue  Weakness  positive for chills   Skin: No Complaints   ENT: No Complaints   Eyes: No Complaints   Neck: No Complaints   Respiratory: No Complaints   Cardiovascular: No Complaints   Gastrointestinal: Nausea  epigastric pain   Genitourinary: No Complaints   Vascular: No Complaints   Musculoskeletal: No Complaints   Neurologic: No Complaints   Hematologic: No Complaints   Endocrine: No Complaints   Psychiatric: No Complaints   Review of Systems: All other systems were reviewed and found to be negative   Medications/Allergies Reviewed Medications/Allergies reviewed   Family & Social History:  Family and Social History:  Family History Coronary  Artery Disease  father with HL   Social History positive  tobacco, negative ETOH, negative Illicit drugs   + Tobacco Current (within 1 year)  35 pack years, has cut down to 3/4 ppd   Place of Living Home     syncope:    UTI:    htn:    CAD:    denies:    Partial Hysterectomy:    Cholecystectomy:    back surgery:   Home Medications: Medication Instructions  Status  Imdur 30 mg oral tablet, extended release 1 tab(s) orally once a day (in the morning) Active  Aspir 81 81 mg oral tablet 1 tab(s) orally once a day Active  metoprolol 25 mg oral tablet 0.5 tab(s) orally 2 times a day Active  omeprazole 20 mg oral delayed release tablet 1 tab(s) orally once a day Active   Lab Results:  Hepatic:  12-Aug-15 10:28   Bilirubin, Total  1.6  Alkaline Phosphatase  217 (46-116 NOTE: New Reference Range 06/30/14)  SGPT (ALT)  197 (14-63 NOTE: New Reference Range 06/30/14)  SGOT (AST)  488  Total Protein, Serum 7.5  Albumin, Serum  3.3  Routine Chem:  12-Aug-15 10:28   Lipase 174 (Result(s) reported on 22 Jul 2014 at 12:49PM.)  Glucose, Serum  162  BUN 11  Creatinine (comp) 0.84  Sodium, Serum 143  Potassium, Serum  2.7  Chloride, Serum 103  CO2, Serum 30  Calcium (Total), Serum 8.8  Osmolality (calc) 288  eGFR (African American) >60  eGFR (Non-African American) >60 (eGFR values <70m/min/1.73 m2 may be an indication of chronic kidney disease (CKD). Calculated eGFR is useful in patients with stable renal function. The eGFR calculation will not be reliable in acutely ill patients when serum creatinine is changing rapidly. It is not useful in  patients on dialysis. The eGFR calculation may not be applicable to patients at the low and high extremes of body sizes, pregnant women, and vegetarians.)  Anion Gap 10  B-Type Natriuretic Peptide ([facility]  1509 (Result(s) reported on 22 Jul 2014 at 10:59AM.)  Cardiac:  12-Aug-15 10:28   Troponin I < 0.02 (0.00-0.05 0.05 ng/mL or less: NEGATIVE  Repeat testing in 3-6 hrs  if clinically indicated. >0.05 ng/mL: POTENTIAL  MYOCARDIAL INJURY. Repeat  testing in 3-6 hrs if  clinically indicated. NOTE: An increase or decrease  of 30% or more on serial  testing suggests a  clinically important change)  CK, Total 131 (26-192 NOTE: NEW REFERENCE RANGE  01/12/2014)  CPK-MB, Serum 1.3 (Result(s)  reported on 22 Jul 2014 at 10:59AM.)  Routine Coag:  12-Aug-15 10:28   Prothrombin 12.9  INR 1.0 (INR reference interval applies to patients on anticoagulant therapy. A single INR therapeutic range for coumarins is not optimal for all indications; however, the suggested range for most indications is 2.0 - 3.0. Exceptions to the INR Reference Range may include: Prosthetic heart valves, acute myocardial infarction, prevention of myocardial infarction, and combinations of aspirin and anticoagulant. The need for a higher or lower target INR must be assessed individually. Reference: The Pharmacology and Management of the Vitamin K  antagonists: the seventh ACCP Conference on Antithrombotic and Thrombolytic Therapy. CHENID.7824Sept:126 (3suppl): [id number] A HCT value >55% may artifactually increase the PT.  In one study,  the increase was an average of 25%. Reference:  "Effect on Routine and Special Coagulation Testing Values of Citrate Anticoagulant Adjustment in Patients with High HCT Values." American Journal of Clinical Pathology 2006;126:400-405.)  Activated PTT (APTT) 27.2 (  A HCT value >55% may artifactually increase the APTT. In one study, the increase was an average of 19%. Reference: "Effect on Routine and Special Coagulation Testing Values of Citrate Anticoagulant Adjustment in Patients with High HCT Values." American Journal of Clinical Pathology 2006;126:400-405.)  Routine Hem:  12-Aug-15 10:28   WBC (CBC)  18.5  RBC (CBC) 4.77  Hemoglobin (CBC) 13.9  Hematocrit (CBC) 43.1  Platelet Count (CBC) 232 (Result(s) reported on 22 Jul 2014 at 10:47AM.)  MCV 90  MCH 29.2  MCHC 32.4  RDW 14.2   EKG:  EKG Interp. by me   Interpretation EKG with NSR, 65, TWI inferolateral leads possibly 2/2 hypokalemia   Radiology Results: XRay:    12-Aug-15 10:42, Chest Portable Single View  Chest Portable Single View   REASON FOR EXAM:    Chest Pain  COMMENTS:       PROCEDURE: DXR -  DXR PORTABLE CHEST SINGLE VIEW  - Jul 22 2014 10:42AM     CLINICAL DATA:  Chest pain with nausea.    EXAM:  PORTABLE CHEST - 1 VIEW    COMPARISON:  04/29/2014 and 01/23/2012.    FINDINGS:  1036 hr. The heart size and mediastinal contours are stable. The  lungs are clear. There is stable asymmetric left apical pleural  thickening. Mild asymmetry of the chest wall appears positional. No  acute osseous findings are evident. Telemetry leads overlie the  chest.     IMPRESSION:  No acute cardiopulmonary process.      Electronically Signed    By: Bill  Veazey M.D.    On: 07/22/2014 11:11         Verified By: WILLIAM B. VEAZEY, M.D.,  CT:    12-Aug-15 14:00, CT Abdomen and Pelvis With Contrast  CT Abdomen and Pelvis With Contrast   REASON FOR EXAM:    (1) abdominal pain; (2) abdominal pain  COMMENTS:       PROCEDURE: CT  - CT ABDOMEN / PELVIS  W  - Jul 22 2014  2:00PM     CLINICAL DATA:  Abdominal pain.  Nausea and vomiting.    EXAM:  CT ABDOMEN AND PELVIS WITH CONTRAST    TECHNIQUE:  Multidetector CT imaging of the abdomen and pelvis was performed  using the standard protocol following bolus administration of  intravenous contrast.  CONTRAST:  100 mL of Isovue 370 intravenous contrast    COMPARISON:  None.    FINDINGS:  A 5 mm calcification lies in the distal common bile duct. There is  significant intra and extrahepatic bile duct dilation. Common bile  duct measures 1 cm in diameter and the pancreatic head. No other  evidence of a duct stone. Gallbladder is surgicallyabsent.    There is lung base subsegmental atelectasis and/or scarring. Heart  is normal in size. Liver and spleen are unremarkable. No pancreatic  masses or inflammation. 18 mm right adrenal mass or fullness,  nonspecific. May reflect an adenoma or hyperplasia. Normal left  adrenal gland.  Small left renal cyst. Kidneys otherwise unremarkable. Normal  ureters and bladder.    Uterus is surgically  absent.  No pelvic masses.    No adenopathy.  No abnormal fluid collections.    Unremarkable bowel.  Normal appendix.    There are degenerative changes of the visualized spine. No  osteoblastic or osteolytic lesions.     IMPRESSION:  1. Choledocholithiasis. A stone lies in the distal common bile duct  and there is intra and extrahepatic bile duct   dilation. Recommend  followup ERCP.  2. No other acute findings.      Electronically Signed    By: Lajean Manes M.D.    On: 07/22/2014 14:30         Verified By: Lasandra Beech, M.D.,    No Known Allergies:   Vital Signs/Nurse's Notes:  **Vital Signs.:   12-Aug-15 15:05  Vital Signs Type Admission  Temperature Temperature (F) 99.5  Celsius 37.5  Temperature Source oral  Pulse Pulse 82  Respirations Respirations 18  Systolic BP Systolic BP 381  Diastolic BP (mmHg) Diastolic BP (mmHg) 72  Mean BP 89  Pulse Ox % Pulse Ox % 98  Pulse Ox Activity Level  At rest    Impression 59 y/o F with h/o nonobstructive CAD (cath 06/19/2014 with LAD 50% diffuse stenosis and ramus intermedius with 50% stenosis in the proximal third), syncope and collapse, HTN, and current everyday smoker (35 pack years, has cut down to 3/4 pack/day) who presents to [facility] ED today with complaint of substernal chest pain radiating into her abdomen and RUQ since this morning found to have choledocholithiasis and hypokalemia.   1) Chest pain:  Appears to be GI in etiology.   recently underwent cardiac cath 06/19/2014 that demonstrated nonobstructive CAD and medical management was recommended at that time. -- CT scan reveals choledocholithiasis with a stone stuck in the distal CBD. There is intra and extraheptic bile duct dilation. Radiology recommends ERCP.  ---Given her reent cath demonstrating nonobstructive CAD and negative troponin so far would pursue GI. Her TWI is likely 2/2 hypokalemia. Will replete her K+ and check serial EKG.   2) Choledocholithiasis:   Stone stuck in the distal CBD with intra and extraheptic bile duct dilation. WBC count 18.5. AST 488, ALT 197, T bili 1.6, alk phos 217, K+ 2.7. Lipase nl. Radiology recommends ERCP. Patient is quite TTP along the RUQ. GI has been consulted by IM.    3) Hypokalemia:  K+ 2.7. 2/2 her increased nausea and vomiting.   TWI on EKG may be 2/2 to the hypokalemia.  Will replet K+,  IM currently has her receiving KCl 40 meq q 6 hours, stop after 3 doses. CMP pending.     4) HTN:  Controlled. Continue current medications.  5) smoker: recommended smoking cessation   Electronic Signatures: Rise Mu (PA-C)  (Signed 12-Aug-15 15:39)  Authored: General Aspect/Present Illness, History and Physical Exam, Review of System, Family & Social History, Past Medical History, Home Medications, Labs, EKG , Radiology, Allergies, Impression/Plan Ida Rogue (MD)  (Signed 12-Aug-15 18:41)  Authored: General Aspect/Present Illness, History and Physical Exam, Review of System, Family & Social History, EKG , Vital Signs/Nurse's Notes, Impression/Plan  Co-Signer: General Aspect/Present Illness, History and Physical Exam, Review of System, Family & Social History, Past Medical History, Home Medications, Labs, EKG , Radiology, Allergies, Impression/Plan   Last Updated: 12-Aug-15 18:41 by Ida Rogue (MD)

## 2015-04-03 NOTE — H&P (Signed)
PATIENT NAME:  Rachel [name], Rachel [name] MR#:  [id number] DATE OF BIRTH:  [birth year]  DATE OF ADMISSION:  07/22/2014  PRIMARY CARE PROVIDER: Valetta Close, MD.   EMERGENCY DEPARTMENT REFERRING PHYSICIAN: Desiree Lucy. Jasmine December, MD.   CHIEF COMPLAINT: Chest pain, abdominal pain.   HISTORY OF PRESENT ILLNESS: The patient is a 59 year old white female who reports that she started having chest pain and underwent a cardiac catheterization in the end of June, which showed moderate distal LAD disease as well as moderate proximal ramus disease, who is medically managed. She states that she has been having on and off symptoms of substernal chest pain going down to her abdomen, and has been having nausea and vomiting. Two weeks ago, she was very sick with these symptoms, but did not come to the ED. The patient states that she started having substernal chest pain in the lower part of her chest earlier today radiating down to her abdomen, then started having a lot of nausea and vomiting. The patient came to the ED with these symptoms. She had EKG checked which showed inferior T-wave inversions. However, she also was noted to have a WBC count that is elevated. The patient was noted to have leukocytosis as well as elevated LFTs. She does complain of generalized abdominal tenderness; going on and off. She also has had nausea and vomiting and had diarrhea during her previous attack. She otherwise also complains of chills. During her attack 2 weeks ago, she had fevers. She denies any urinary frequency, urgency, or hesitancy.   PAST MEDICAL HISTORY:  1. History of syncope.  2. Hypertension.  3. Coronary artery disease with distal moderate LAD disease as well as moderate proximal ramus disease.  4. GERD.   PAST SURGICAL HISTORY: Status post cholecystectomy, back surgery and partial hysterectomy.   ALLERGIES: None.   MEDICATIONS: Omeprazole 20 daily, metoprolol 25 one-half tab p.o. b.i.d., Imdur 30 daily, aspirin 81 mg 1 tab  p.o. daily.  SOCIAL HISTORY: History of smoking about 1 pack for 1-1/2 day. Drinks occasionally. No drug use.   FAMILY HISTORY: Grandmother with heart trouble. There is also a family history of gallbladder cancer.   REVIEW OF SYSTEMS: CONSTITUTIONAL: Complains of fever, fatigue, weakness, pain. No weight loss. No weight gain.  EYES: No blurred or double vision. No pain or redness. No inflammation.  ENT: No tinnitus, ear pain. No hearing loss. No allergies; seasonal or year-round allergies. No difficulty swallowing.  RESPIRATORY: Denies any cough, wheezing, hemoptysis. No dyspnea. No COPD. No TB.  CARDIOVASCULAR: Denies any chest pain, orthopnea, edema, or arrhythmia.  GASTROINTESTINAL: Complains of nausea and vomiting, abdominal pain, generalized. No hematemesis. No melena. Has a history of GERD. No jaundice. Denies any polyuria, nocturia, thyroid problems.  HEMATOLOGIC AND LYMPHATIC: Denies anemia, easy bruisability or bleeding.  SKIN: No acne. No rash.  MUSCULOSKELETAL: Complains of pain in the left hand and has been followed by ortho after a fall for that.  NEUROLOGIC: No numbness, CVA, TIA.  PSYCHIATRIC: No anxiety, insomnia, or ADD.   PHYSICAL EXAMINATION: VITAL SIGNS: Temperature 97, pulse 65, respirations 22, blood pressure 170/85, O2 100%.  GENERAL: The patient is a well-developed, well-nourished female in no acute distress.  HEENT: Head atraumatic, normocephalic. Pupils equally round, reactive to light and accommodation. There is no conjunctival pallor. No scleral icterus. Nasal exam shows no drainage or ulceration.  OROPHARYNX: Clear without any exudate.  NECK: Supple without any JVD.  ABDOMEN: Soft. Generalized tenderness without any guarding. No rebound.  EXTREMITIES: No  clubbing, cyanosis, or edema.  SKIN: No rash.  LYMPHATICS: No lymph nodes palpable.  VASCULAR: Good DP, PT pulses.  PSYCHIATRIC: Not anxious or depressed.  NEUROLOGIC: Awake, alert, oriented x 3. No focal  deficits.  LABORATORIES: Glucose 162. BNP is 1509. BUN 11, creatinine 0.84, sodium 143, potassium 2.7, chloride 103, CO2 is 30, calcium 8.8. LFTs: Total protein 7.5, albumin 3.3, bilirubin total 1.6, alkaline phosphatase 217, AST 488, ALT 197. CPK is 131, CK-MB 1.3. Troponin less than 0.02. WBC 18.5, hemoglobin 13.9, platelet count 232,000.   Chest x-ray shows no acute cardiopulmonary processes.   ASSESSMENT AND PLAN: The patient is a 59 year old white female with history of distal coronary artery disease who presents with chest pain, abdominal pain.  1. Abdominal pain, leukocytosis, elevated LFTs. We will have to check a CT scan of the abdomen and pelvis. Will get a gastroenterology consult to evaluate the patient's symptomology. I will check a hepatitis panel and a stat lipase level.  2. Chest pain, history of distal disease. We will have cardiology evaluation, serial cardiac enzymes. We will continue Imdur.  3. Severe hypokalemia, replace. We will follow potassium due to nausea and vomiting.  4. Severe hypokalemia due to nausea and vomiting. We will replace her potassium and follow potassium level. Monitor on telemetry.  5. Miscellaneous. The patient will be on heparin for deep vein thrombosis prophylaxis.   TIME SPENT: A total of 50 minutes on this patient.   ____________________________ Lafonda Mosses. Posey Pronto, MD shp:NT D: 07/22/2014 12:31:38 ET T: 07/22/2014 13:29:17 ET JOB#: [id number]  cc: Friend Dorfman H. Posey Pronto, MD, <Dictator> Alric Seton MD ELECTRONICALLY SIGNED 07/28/2014 14:28

## 2015-04-03 NOTE — Consult Note (Signed)
Pt with CBD stone, elevated WBC, elevated LFT's, T wave changes.  Cath few months ago showed 50% LAD blockage.  BNP elevated to1509, troponin neg on first one.  On PE very tender across abdomen, esp in RUQ.  Agree with empiric Unasyn.  Will contact Dr. Allen Norris in morning about need for ERCP, she had her gall bladder removed in 1990's.  Electronic Signatures: Manya Silvas (MD)  (Signed on 12-Aug-15 18:57)  Authored  Last Updated: 12-Aug-15 18:57 by Manya Silvas (MD)

## 2015-04-03 NOTE — Consult Note (Signed)
Pt had ERCP with some stones removed but a few remained so a stent was placed.  She feels much better and very much wants to go home as she reports a death in extended family.  She tolerated clear liq breakfast well, denies pain, nausea, chills 0r fever.  From a GI stand point she can go home today with clear liq diet today and full liquiid diet tomorrow and soft thereafter.  Follow up with Dr Allen Norris in a few weeks and repeat ERCP in 2-3 months.  Electronic Signatures: Manya Silvas (MD)  (Signed on 15-Aug-15 08:56)  Authored  Last Updated: 15-Aug-15 08:56 by Manya Silvas (MD)

## 2015-04-03 NOTE — Consult Note (Signed)
Chief Complaint:  Subjective/Chief Complaint We were contacted to evaluate patient for ERCP with stone extraction.  Dr Elliott's notes reviewed.  Pt continues to have epigastric pain 4/10 at worst,  [facility] ehad 4 loose stools but believes this was due to contrast yesterday.  Denies nausea or vomiting.  She states she is hungry and wants something to eat.   VITAL SIGNS/ANCILLARY NOTES: **Vital Signs.:   13-Aug-15 04:31  Vital Signs Type Routine  Temperature Temperature (F) 98.6  Celsius 37  Temperature Source oral  Pulse Pulse 60  Respirations Respirations 18  Systolic BP Systolic BP 536  Diastolic BP (mmHg) Diastolic BP (mmHg) 63  Mean BP 75  Pulse Ox % Pulse Ox % 95  Pulse Ox Activity Level  At rest  Oxygen Delivery 2L   Brief Assessment:  GEN well developed, well nourished, no acute distress, +family at bedside   Cardiac Regular   Respiratory normal resp effort   Gastrointestinal Normal   Gastrointestinal details normal Soft  Nontender  Nondistended  No masses palpable  Bowel sounds normal  No rebound tenderness  No gaurding  No rigidity   EXTR negative cyanosis/clubbing, negative edema   Additional Physical Exam Skin: pink, warm, dry, no jaundice HEENT: no scleral icterus or injections   Lab Results: Thyroid:  13-Aug-15 04:00   Thyroid Stimulating Hormone  0.22 (0.45-4.50 (International Unit)  ----------------------- Pregnant patients have  different reference  ranges for TSH:  - - - - - - - - - -  Pregnant, first trimetser:  0.36 - 2.50 uIU/mL)  Hepatic:  13-Aug-15 04:00   Bilirubin, Total  4.1  Alkaline Phosphatase  180 (46-116 NOTE: New Reference Range 06/30/14)  SGPT (ALT)  204 (14-63 NOTE: New Reference Range 06/30/14)  SGOT (AST)  231  Total Protein, Serum  6.0  Albumin, Serum  2.5  Routine Chem:  13-Aug-15 04:00   Glucose, Serum 89  BUN 10  Creatinine (comp) 0.78  Sodium, Serum 142  Potassium, Serum  2.7  Chloride, Serum 104  CO2, Serum 32   Calcium (Total), Serum  7.9  Osmolality (calc) 282  eGFR (African American) >60  eGFR (Non-African American) >60 (eGFR values <66m/min/1.73 m2 may be an indication of chronic kidney disease (CKD). Calculated eGFR is useful in patients with stable renal function. The eGFR calculation will not be reliable in acutely ill patients when serum creatinine is changing rapidly. It is not useful in  patients on dialysis. The eGFR calculation may not be applicable to patients at the low and high extremes of body sizes, pregnant women, and vegetarians.)  Anion Gap  6  Cholesterol, Serum 94  Triglycerides, Serum 56  HDL (INHOUSE) 44  VLDL Cholesterol Calculated 11  LDL Cholesterol Calculated 39 (Result(s) reported on 23 Jul 2014 at 06:49AM.)  Routine Hem:  13-Aug-15 04:00   WBC (CBC)  14.0  RBC (CBC) 3.97  Hemoglobin (CBC)  11.6  Hematocrit (CBC) 35.9  Platelet Count (CBC) 175  MCV 91  MCH 29.4  MCHC 32.4  RDW 14.3  Neutrophil % 87.4  Lymphocyte % 6.7  Monocyte % 5.5  Eosinophil % 0.1  Basophil % 0.3  Neutrophil #  12.2  Lymphocyte #  0.9  Monocyte # 0.8  Eosinophil # 0.0  Basophil # 0.0 (Result(s) reported on 23 Jul 2014 at 05:01AM.)   Radiology Results: XRay:    12-Aug-15 10:42, Chest Portable Single View  Chest Portable Single View   REASON FOR EXAM:    Chest Pain  COMMENTS:       PROCEDURE: DXR - DXR PORTABLE CHEST SINGLE VIEW  - Jul 22 2014 10:42AM     CLINICAL DATA:  Chest pain with nausea.    EXAM:  PORTABLE CHEST - 1 VIEW    COMPARISON:  04/29/2014 and 01/23/2012.    FINDINGS:  1036 hr. The heart size and mediastinal contours are stable. The  lungs are clear. There is stable asymmetric left apical pleural  thickening. Mild asymmetry of the chest wall appears positional. No  acute osseous findings are evident. Telemetry leads overlie the  chest.     IMPRESSION:  No acute cardiopulmonary process.      Electronically Signed    By: Camie Patience M.D.     On: 07/22/2014 11:11         Verified By: Vivia Ewing, M.D.,  CT:    12-Aug-15 14:00, CT Abdomen and Pelvis With Contrast  CT Abdomen and Pelvis With Contrast   REASON FOR EXAM:    (1) abdominal pain; (2) abdominal pain  COMMENTS:       PROCEDURE: CT  - CT ABDOMEN / PELVIS  W  - Jul 22 2014  2:00PM     CLINICAL DATA:  Abdominal pain.  Nausea and vomiting.    EXAM:  CT ABDOMEN AND PELVIS WITH CONTRAST    TECHNIQUE:  Multidetector CT imaging of the abdomen and pelvis was performed  using the standard protocol following bolus administration of  intravenous contrast.  CONTRAST:  100 mL of Isovue 370 intravenous contrast    COMPARISON:  None.    FINDINGS:  A 5 mm calcification lies in the distal common bile duct. There is  significant intra and extrahepatic bile duct dilation. Common bile  duct measures 1 cm in diameter and the pancreatic head. No other  evidence of a duct stone. Gallbladder is surgicallyabsent.    There is lung base subsegmental atelectasis and/or scarring. Heart  is normal in size. Liver and spleen are unremarkable. No pancreatic  masses or inflammation. 18 mm right adrenal mass or fullness,  nonspecific. May reflect an adenoma or hyperplasia. Normal left  adrenal gland.  Small left renal cyst. Kidneys otherwise unremarkable. Normal  ureters and bladder.    Uterus is surgically absent.  No pelvic masses.    No adenopathy.  No abnormal fluid collections.    Unremarkable bowel.  Normal appendix.    There are degenerative changes of the visualized spine. No  osteoblastic or osteolytic lesions.     IMPRESSION:  1. Choledocholithiasis. A stone lies in the distal common bile duct  and there is intra and extrahepatic bile duct dilation. Recommend  followup ERCP.  2. No other acute findings.      Electronically Signed    By: Lajean Manes M.D.    On: 07/22/2014 14:30         Verified By: Lasandra Beech, M.D.,   Assessment/Plan:   Assessment/Plan:  Assessment Choledocholithiasis:  Appreciate referral for ERCP with common bile duct stone extraction & sphincterotomy with Dr Allen Norris.  Dr Elliott's records reviewed.  Her hypokalemia will need to be corrected prior to procedure.  Discussed risks/benefits of procedure which include but are not limited to pancreatitis, bleeding, infection, perforation & drug reaction.  Patient agrees with this plan & consent will be obtained. Diarrhea: likely secondary to CT contrast, but pt advised to let staff know if persists she should have c diff PCR Hypokalemia: will need aggressive K repletion per  attending Abnormal TSH: per hospitalist   Plan 1) ERCP tomorrow with Dr Allen Norris if K normal 2) Aggressive K repletion per attending, recheck in AM 3) NPO after MN 4) continue Unasyn Please call if you have any questions or concerns   Electronic Signatures: Andria Meuse (NP)  (Signed 13-Aug-15 10:04)  Authored: Chief Complaint, VITAL SIGNS/ANCILLARY NOTES, Brief Assessment, Lab Results, Radiology Results, Assessment/Plan   Last Updated: 13-Aug-15 10:04 by Andria Meuse (NP)

## 2015-04-03 NOTE — Discharge Summary (Signed)
PATIENT NAME:  Rachel [name], Rachel [name] MR#:  [id number] DATE OF BIRTH:  [birth year]  PRIMARY CARE PHYSICIAN: Valetta Close, MD  CARDIOLOGIST: Minna Merritts, MD  GASTROENTEROLOGIST: Manya Silvas, MD  CONSULTATIONS DURING [place] COURSE: Minna Merritts, MD and Manya Silvas, MD.  PROCEDURES: Status post endoscopic retrograde cholangiopancreatography and partial removal of the biliary stones with biliary sphincterotomy and stent placement on August 14.  BRIEF HISTORY AND PHYSICAL AND HOSPITAL COURSE: The patient is a 59 year old Caucasian female who came in to the ED with a chief complaint of epigastric abdominal pain and chest pain. The patient just had cardiac catheterization done in the end of June which has revealed moderate distal LAD disease with moderate proximal ramus disease which was medically managed. Please review history and physical for details. The patient was admitted to the hospital with leukocytosis and elevated LFTs. The patient was also nauseated and vomiting at the time of admission. For epigastric abdominal pain, CAT scan of the abdomen and pelvis was ordered, which has revealed gallstones. GI was consulted. Hepatitis panel was ordered, which was negative. The patient was seen by gastroenterology and ERCP was ordered for choledocholithiasis. The patient had ERCP, partial stone removal, biliary sphincterotomy, and stent placement on August 14. Subsequently, the patient was placed on clear liquid diet. She is still complaining of epigastric pain, but that is better. Clinically she is improved. GI has recommended to continue clear liquid diet on August 15, full liquid diet on August 16, and start taking a soft diet from August 17. They have recommended the patient to call office on Monday August 17 regarding further followup appointment.   Regarding the chest pain, cardiology was consulted. Serial cardiac enzymes were ordered. Her home medications, including Imdur, were  continued. Troponins did not trend up and acute MI was ruled out. Today her leukocytosis is resolved. GI has recommended to discharge the patient home with clear liquid diet.  CONDITION: At the time of discharge, stable.   ACTIVITY: As tolerated.  DIET: Clear liquid diet on August 15, full liquid diet on August 16, and soft diet on August 17 onward.  FOLLOWUP: Followup appointment with Dr. Clemmie Krill in 1 week. Call gastroenterology to schedule a followup appointment with Dr. Allen Norris on Monday August 17.  SIGNIFICANT LABORATORIES AND IMAGING STUDIES: On August 15, total protein 6.5, albumin 2.6, bilirubin total 2.0, alkaline phosphatase 189, AST 96, ALT 123. WBC 6.6, hemoglobin 11.6, platelets are 132,000. BMP is normal.  MEDICATIONS AT THE TIME OF DISCHARGE: Imdur 30 mg extended release 1 tablet p.o. once daily in a.m., aspirin 81 mg once daily, metoprolol 25 mg 1/2 tablet p.o. 2 times a day, omeprazole 20 mg p.o. once daily, tramadol 50 mg 1 tablet every 4 hours as needed for pain.   ____________________________ Nicholes Mango, MD ag:ST D: 07/25/2014 13:59:35 ET T: 07/25/2014 21:42:50 ET JOB#: [id number]  cc: Nicholes Mango, MD, <Dictator> Nicholes Mango MD ELECTRONICALLY SIGNED 07/30/2014 16:07

## 2015-12-12 DIAGNOSIS — C20 Malignant neoplasm of rectum: Secondary | ICD-10-CM

## 2015-12-12 DIAGNOSIS — Z923 Personal history of irradiation: Secondary | ICD-10-CM

## 2015-12-12 DIAGNOSIS — Z9221 Personal history of antineoplastic chemotherapy: Secondary | ICD-10-CM

## 2015-12-12 HISTORY — DX: Malignant neoplasm of rectum: C20

## 2015-12-12 HISTORY — DX: Personal history of irradiation: Z92.3

## 2015-12-12 HISTORY — DX: Personal history of antineoplastic chemotherapy: Z92.21

## 2016-06-21 DIAGNOSIS — IMO0002 Reserved for concepts with insufficient information to code with codable children: Secondary | ICD-10-CM | POA: Insufficient documentation

## 2016-06-23 ENCOUNTER — Other Ambulatory Visit: Payer: Self-pay | Admitting: Family Medicine

## 2016-06-23 DIAGNOSIS — Z1231 Encounter for screening mammogram for malignant neoplasm of breast: Secondary | ICD-10-CM

## 2016-07-17 ENCOUNTER — Observation Stay (HOSPITAL_BASED_OUTPATIENT_CLINIC_OR_DEPARTMENT_OTHER): Payer: BLUE CROSS/BLUE SHIELD

## 2016-07-17 ENCOUNTER — Emergency Department (HOSPITAL_COMMUNITY): Payer: BLUE CROSS/BLUE SHIELD

## 2016-07-17 ENCOUNTER — Observation Stay (HOSPITAL_COMMUNITY): Payer: BLUE CROSS/BLUE SHIELD

## 2016-07-17 ENCOUNTER — Encounter (HOSPITAL_COMMUNITY): Payer: Self-pay | Admitting: Radiology

## 2016-07-17 ENCOUNTER — Observation Stay (HOSPITAL_COMMUNITY)
Admission: EM | Admit: 2016-07-17 | Discharge: 2016-07-17 | Disposition: A | Discharge status: Home / Self Care | Payer: BLUE CROSS/BLUE SHIELD | Attending: Internal Medicine | Admitting: Internal Medicine

## 2016-07-17 DIAGNOSIS — Z791 Long term (current) use of non-steroidal anti-inflammatories (NSAID): Secondary | ICD-10-CM | POA: Insufficient documentation

## 2016-07-17 DIAGNOSIS — F17210 Nicotine dependence, cigarettes, uncomplicated: Secondary | ICD-10-CM | POA: Insufficient documentation

## 2016-07-17 DIAGNOSIS — R42 Dizziness and giddiness: Principal | ICD-10-CM | POA: Insufficient documentation

## 2016-07-17 DIAGNOSIS — G459 Transient cerebral ischemic attack, unspecified: Secondary | ICD-10-CM

## 2016-07-17 DIAGNOSIS — F17200 Nicotine dependence, unspecified, uncomplicated: Secondary | ICD-10-CM

## 2016-07-17 DIAGNOSIS — I10 Essential (primary) hypertension: Secondary | ICD-10-CM | POA: Diagnosis not present

## 2016-07-17 DIAGNOSIS — R531 Weakness: Secondary | ICD-10-CM | POA: Diagnosis not present

## 2016-07-17 DIAGNOSIS — E876 Hypokalemia: Secondary | ICD-10-CM | POA: Insufficient documentation

## 2016-07-17 DIAGNOSIS — I6789 Other cerebrovascular disease: Secondary | ICD-10-CM

## 2016-07-17 DIAGNOSIS — Z79899 Other long term (current) drug therapy: Secondary | ICD-10-CM | POA: Diagnosis not present

## 2016-07-17 DIAGNOSIS — R29810 Facial weakness: Secondary | ICD-10-CM | POA: Insufficient documentation

## 2016-07-17 DIAGNOSIS — Z87891 Personal history of nicotine dependence: Secondary | ICD-10-CM | POA: Diagnosis present

## 2016-07-17 DIAGNOSIS — R4182 Altered mental status, unspecified: Secondary | ICD-10-CM | POA: Diagnosis present

## 2016-07-17 DIAGNOSIS — I6350 Cerebral infarction due to unspecified occlusion or stenosis of unspecified cerebral artery: Secondary | ICD-10-CM

## 2016-07-17 DIAGNOSIS — I639 Cerebral infarction, unspecified: Secondary | ICD-10-CM

## 2016-07-17 LAB — ECHOCARDIOGRAM COMPLETE
AVLVOTPG: 4 mmHg
AVPHT: 760 ms
CHL CUP DOP CALC LVOT VTI: 27.7 cm
E decel time: 292 msec
FS: 42 % (ref 28–44)
HEIGHTINCHES: 66 in
IVS/LV PW RATIO, ED: 1.5
LADIAMINDEX: 1.53 cm/m2
LASIZE: 27 mm
LAVOL: 45.8 mL
LAVOLA4C: 38.5 mL
LAVOLIN: 26 mL/m2
LEFT ATRIUM END SYS DIAM: 27 mm
LV PW d: 8 mm — AB (ref 0.6–1.1)
LVELAT: 9.68 cm/s
LVOT SV: 63 mL
LVOT area: 2.27 cm2
LVOT diameter: 17 mm
LVOT peak vel: 105 cm/s
Lateral S' vel: 13.3 cm/s
MV Dec: 292
MVPKEVEL: 1.1 m/s
RV TAPSE: 25.6 mm
RV sys press: 24 mmHg
Reg peak vel: 227 cm/s
TDI e' lateral: 9.68
TDI e' medial: 3.92
TRMAXVEL: 227 cm/s
WEIGHTICAEL: 2380.8 [oz_av]

## 2016-07-17 LAB — GLUCOSE, CAPILLARY
GLUCOSE-CAPILLARY: 131 mg/dL — AB (ref 65–99)
GLUCOSE-CAPILLARY: 96 mg/dL (ref 65–99)
GLUCOSE-CAPILLARY: 84 mg/dL (ref 65–99)
Glucose-Capillary: 109 mg/dL — ABNORMAL HIGH (ref 65–99)

## 2016-07-17 LAB — BASIC METABOLIC PANEL
Anion gap: 8 (ref 5–15)
BUN: 8 mg/dL (ref 6–20)
CALCIUM: 9.1 mg/dL (ref 8.9–10.3)
CHLORIDE: 105 mmol/L (ref 101–111)
CO2: 27 mmol/L (ref 22–32)
CREATININE: 0.65 mg/dL (ref 0.44–1.00)
GFR calc non Af Amer: >60 mL/min (ref >60)
Glucose, Bld: 118 mg/dL — ABNORMAL HIGH (ref 65–99)
Potassium: 3.4 mmol/L — ABNORMAL LOW (ref 3.5–5.1)
SODIUM: 140 mmol/L (ref 135–145)

## 2016-07-17 LAB — COMPREHENSIVE METABOLIC PANEL
ALK PHOS: 102 U/L (ref 38–126)
ALT: 11 U/L — AB (ref 14–54)
AST: 19 U/L (ref 15–41)
Albumin: 3.5 g/dL (ref 3.5–5.0)
Anion gap: 4 — ABNORMAL LOW (ref 5–15)
BILIRUBIN TOTAL: 0.4 mg/dL (ref 0.3–1.2)
BUN: 11 mg/dL (ref 6–20)
CALCIUM: 8.7 mg/dL — AB (ref 8.9–10.3)
CHLORIDE: 105 mmol/L (ref 101–111)
CO2: 26 mmol/L (ref 22–32)
CREATININE: 0.6 mg/dL (ref 0.44–1.00)
Glucose, Bld: 128 mg/dL — ABNORMAL HIGH (ref 65–99)
Potassium: 2.9 mmol/L — ABNORMAL LOW (ref 3.5–5.1)
Sodium: 135 mmol/L (ref 135–145)
Total Protein: 6.5 g/dL (ref 6.5–8.1)

## 2016-07-17 LAB — LIPID PANEL
CHOL/HDL RATIO: 2.7 ratio
CHOLESTEROL: 150 mg/dL (ref 0–200)
HDL: 55 mg/dL (ref >40)
LDL CALC: 85 mg/dL (ref 0–99)
Triglycerides: 48 mg/dL (ref <150)
VLDL: 10 mg/dL (ref 0–40)

## 2016-07-17 LAB — RAPID URINE DRUG SCREEN, HOSP PERFORMED
Amphetamines: NOT DETECTED
BARBITURATES: NOT DETECTED
BENZODIAZEPINES: NOT DETECTED
Cocaine: NOT DETECTED
Opiates: NOT DETECTED
Tetrahydrocannabinol: NOT DETECTED

## 2016-07-17 LAB — CBC
HEMATOCRIT: 39.8 % (ref 36.0–46.0)
HEMATOCRIT: 40.9 % (ref 36.0–46.0)
HEMOGLOBIN: 13.4 g/dL (ref 12.0–15.0)
Hemoglobin: 13 g/dL (ref 12.0–15.0)
MCH: 29.4 pg (ref 26.0–34.0)
MCH: 29.5 pg (ref 26.0–34.0)
MCHC: 32.7 g/dL (ref 30.0–36.0)
MCHC: 32.8 g/dL (ref 30.0–36.0)
MCV: 89.7 fL (ref 78.0–100.0)
MCV: 90.5 fL (ref 78.0–100.0)
Platelets: 169 10*3/uL (ref 150–400)
Platelets: 182 10*3/uL (ref 150–400)
RBC: 4.4 MIL/uL (ref 3.87–5.11)
RBC: 4.56 MIL/uL (ref 3.87–5.11)
RDW: 14.4 % (ref 11.5–15.5)
RDW: 14.4 % (ref 11.5–15.5)
WBC: 10.9 10*3/uL — AB (ref 4.0–10.5)
WBC: 8.4 10*3/uL (ref 4.0–10.5)

## 2016-07-17 LAB — DIFFERENTIAL
BASOS ABS: 0 10*3/uL (ref 0.0–0.1)
BASOS PCT: 0 %
Eosinophils Absolute: 0.1 10*3/uL (ref 0.0–0.7)
Eosinophils Relative: 1 %
LYMPHS ABS: 3.6 10*3/uL (ref 0.7–4.0)
LYMPHS PCT: 33 %
MONO ABS: 0.8 10*3/uL (ref 0.1–1.0)
MONOS PCT: 8 %
NEUTROS ABS: 6.3 10*3/uL (ref 1.7–7.7)
Neutrophils Relative %: 58 %

## 2016-07-17 LAB — URINE MICROSCOPIC-ADD ON

## 2016-07-17 LAB — URINALYSIS, ROUTINE W REFLEX MICROSCOPIC
BILIRUBIN URINE: NEGATIVE
Glucose, UA: NEGATIVE mg/dL
Ketones, ur: NEGATIVE mg/dL
Leukocytes, UA: NEGATIVE
NITRITE: NEGATIVE
PROTEIN: NEGATIVE mg/dL
Specific Gravity, Urine: 1.026 (ref 1.005–1.030)
pH: 7.5 (ref 5.0–8.0)

## 2016-07-17 LAB — PROTIME-INR
INR: 0.94
Prothrombin Time: 12.6 seconds (ref 11.4–15.2)

## 2016-07-17 LAB — I-STAT CHEM 8, ED
BUN: 13 mg/dL (ref 6–20)
CALCIUM ION: 1.04 mmol/L — AB (ref 1.12–1.23)
CHLORIDE: 102 mmol/L (ref 101–111)
CREATININE: 0.6 mg/dL (ref 0.44–1.00)
Glucose, Bld: 128 mg/dL — ABNORMAL HIGH (ref 65–99)
HCT: 41 % (ref 36.0–46.0)
Hemoglobin: 13.9 g/dL (ref 12.0–15.0)
Potassium: 3 mmol/L — ABNORMAL LOW (ref 3.5–5.1)
SODIUM: 140 mmol/L (ref 135–145)
TCO2: 26 mmol/L (ref 0–100)

## 2016-07-17 LAB — I-STAT TROPONIN, ED: TROPONIN I, POC: 0 ng/mL (ref 0.00–0.08)

## 2016-07-17 LAB — MAGNESIUM: MAGNESIUM: 1.9 mg/dL (ref 1.7–2.4)

## 2016-07-17 LAB — ETHANOL: Alcohol, Ethyl (B): <5 mg/dL (ref <5)

## 2016-07-17 LAB — APTT: APTT: 27 s (ref 24–36)

## 2016-07-17 MED ORDER — POTASSIUM CHLORIDE CRYS ER 20 MEQ PO TBCR: 40.0000 meq | EXTENDED_RELEASE_TABLET | Freq: Two times a day (BID) | ORAL | Status: DC

## 2016-07-17 MED ORDER — STROKE: EARLY STAGES OF RECOVERY BOOK
Freq: Once | Status: AC
  Administered 2016-07-17: 1
  Filled 2016-07-17: qty 1

## 2016-07-17 MED ORDER — ACETAMINOPHEN 325 MG PO TABS: 650.0000 mg | ORAL_TABLET | ORAL | Status: DC | PRN

## 2016-07-17 MED ORDER — ATORVASTATIN CALCIUM 10 MG PO TABS
10.0000 mg | ORAL_TABLET | Freq: Every day | ORAL | Status: DC
  Administered 2016-07-17: 10 mg via ORAL
  Filled 2016-07-17: qty 1

## 2016-07-17 MED ORDER — SENNOSIDES-DOCUSATE SODIUM 8.6-50 MG PO TABS: 1.0000 | ORAL_TABLET | Freq: Every evening | ORAL | Status: DC | PRN

## 2016-07-17 MED ORDER — ASPIRIN 325 MG PO TABS
325.0000 mg | ORAL_TABLET | Freq: Every day | ORAL | Status: DC
  Administered 2016-07-17: 325 mg via ORAL
  Filled 2016-07-17: qty 1

## 2016-07-17 MED ORDER — HYDROCORTISONE 1 % EX CREA
TOPICAL_CREAM | Freq: Four times a day (QID) | CUTANEOUS | Status: DC
  Filled 2016-07-17: qty 28

## 2016-07-17 MED ORDER — ONDANSETRON HCL 4 MG/2ML IJ SOLN
4.0000 mg | Freq: Once | INTRAMUSCULAR | Status: AC
  Administered 2016-07-17: 4 mg via INTRAVENOUS
  Filled 2016-07-17: qty 2

## 2016-07-17 MED ORDER — POTASSIUM CHLORIDE IN NACL 40-0.9 MEQ/L-% IV SOLN
INTRAVENOUS | Status: DC
  Administered 2016-07-17: 125 mL/h via INTRAVENOUS
  Filled 2016-07-17 (×2): qty 1000

## 2016-07-17 MED ORDER — CLOPIDOGREL BISULFATE 75 MG PO TABS: 75.0000 mg | ORAL_TABLET | Freq: Every day | ORAL | Status: DC

## 2016-07-17 MED ORDER — ENOXAPARIN SODIUM 40 MG/0.4ML ~~LOC~~ SOLN
40.0000 mg | Freq: Every day | SUBCUTANEOUS | Status: DC
  Administered 2016-07-17: 40 mg via SUBCUTANEOUS
  Filled 2016-07-17: qty 0.4

## 2016-07-17 MED ORDER — POTASSIUM CHLORIDE CRYS ER 20 MEQ PO TBCR
40.0000 meq | EXTENDED_RELEASE_TABLET | Freq: Once | ORAL | Status: AC
  Administered 2016-07-17: 40 meq via ORAL
  Filled 2016-07-17: qty 2

## 2016-07-17 MED ORDER — CLOPIDOGREL BISULFATE 75 MG PO TABS: 75.0000 mg | ORAL_TABLET | Freq: Every day | ORAL | 0 refills | Status: DC

## 2016-07-17 MED ORDER — ACETAMINOPHEN 650 MG RE SUPP: 650.0000 mg | RECTAL | Status: DC | PRN

## 2016-07-17 MED ORDER — ASPIRIN 300 MG RE SUPP: 300.0000 mg | Freq: Every day | RECTAL | Status: DC

## 2016-07-17 NOTE — Progress Notes (Signed)
STROKE TEAM PROGRESS NOTE   HISTORY OF PRESENT ILLNESS (per record) Rachel [name] is a 60 y.o. female who presents with vertigo, nausea that was present on awakening. She went to bed sometime around 8:30 to 8:45 and states that she was not vertiginous when she laid down. She was, however, feeling "bad" but she is having very much difficulty describing to me exactly what she means by that. On awakening, she was nauseated and throwing up was assisted to the toilet but due to the severity of her symptoms 911 was called. En route, EMS noticed that she appeared to have some problems with her right side and therefore a code stroke was called. When Dr. Leonel Ramsay initially asked her what time she went to bed, she stated that she did not note, when forced to guess she stated 8:30 PM, but no*could've been earlier and that she said "I don't know, yes." Her LKW is unclear, possibly 8:45pm 07/15/2016, though not definite. Patient was not administered IV t-PA secondary to unclear time of onset. She was admitted for further evaluation and treatment.   SUBJECTIVE (INTERVAL HISTORY) Her RN is at the bedside.  No family present. She is lying in the bed, awake. She recounted HPI with Dr Leonie Man. Overall she feels her condition is stable.    OBJECTIVE Temp:  [97.7 F (36.5 C)-98.7 F (37.1 C)] 98.7 F (37.1 C) (08/07 1300) Pulse Rate:  [52-100] 55 (08/07 1300) Cardiac Rhythm: Sinus bradycardia (08/07 0700) Resp:  [16-23] 18 (08/07 1100) BP: (125-189)/(77-98) 160/80 (08/07 1300) SpO2:  [95 %-100 %] 96 % (08/07 1300) Weight:  [67.5 kg (148 lb 12.8 oz)] 67.5 kg (148 lb 12.8 oz) (08/07 0300)  CBC:   Recent Labs Lab 07/17/16 0039 07/17/16 0044 07/17/16 0534  WBC 10.9*  --  8.4  NEUTROABS 6.3  --   --   HGB 13.4 13.9 13.0  HCT 40.9 41.0 39.8  MCV 89.7  --  90.5  PLT 182  --  [id number]    Basic Metabolic Panel:   Recent Labs Lab 07/17/16 0039 07/17/16 0044 07/17/16 0534  NA 135 140 140  K 2.9* 3.0* 3.4*   CL 105 102 105  CO2 26  --  27  GLUCOSE 128* 128* 118*  BUN 11 13 8   CREATININE 0.60 0.60 0.65  CALCIUM 8.7*  --  9.1  MG  --   --  1.9    Lipid Panel:     Component Value Date/Time   CHOL 150 07/17/2016 0534   CHOL 94 07/23/2014 0400   TRIG 48 07/17/2016 0534   TRIG 56 07/23/2014 0400   HDL 55 07/17/2016 0534   HDL 44 07/23/2014 0400   CHOLHDL 2.7 07/17/2016 0534   VLDL 10 07/17/2016 0534   VLDL 11 07/23/2014 0400   LDLCALC 85 07/17/2016 0534   LDLCALC 39 07/23/2014 0400   HgbA1c: No results found for: HGBA1C Urine Drug Screen:     Component Value Date/Time   LABOPIA NONE DETECTED 07/17/2016 0132   COCAINSCRNUR NONE DETECTED 07/17/2016 0132   LABBENZ NONE DETECTED 07/17/2016 0132   AMPHETMU NONE DETECTED 07/17/2016 0132   THCU NONE DETECTED 07/17/2016 0132   LABBARB NONE DETECTED 07/17/2016 0132      IMAGING  Ct Angio Head W Or Wo Contrast  Result Date: 07/17/2016 CLINICAL DATA:  Prior code stroke. Acute onset weakness, diaphoresis, headache and dizziness. History of hypertension and syncope. EXAM: CT ANGIOGRAPHY HEAD AND NECK TECHNIQUE: Multidetector CT imaging of the head  and neck was performed using the standard protocol during bolus administration of intravenous contrast. Multiplanar CT image reconstructions and MIPs were obtained to evaluate the vascular anatomy. Carotid stenosis measurements (when applicable) are obtained utilizing NASCET criteria, using the distal internal carotid diameter as the denominator. CONTRAST:  50 cc Isovue 370 COMPARISON:  CT HEAD July 17, 2016 at 0049 hours FINDINGS: CTA NECK AORTIC ARCH: Normal appearance of the thoracic arch, Common origin innominate and LEFT Common carotid artery. Mild calcific atherosclerosis of the aortic arch and LEFT subclavian artery origin. The origins of the innominate, left Common carotid artery and subclavian artery are widely patent. RIGHT CAROTID SYSTEM: Common carotid artery is widely patent, coursing in a  straight line fashion. Normal appearance of the carotid bifurcation without hemodynamically significant stenosis by NASCET criteria. Normal appearance of the included internal carotid artery. LEFT CAROTID SYSTEM: Common carotid artery is widely patent, coursing in a straight line fashion. Normal appearance of the carotid bifurcation without hemodynamically significant stenosis by NASCET criteria. Mild eccentric calcific atherosclerosis of LEFT internal carotid artery origin. Normal appearance of the included internal carotid artery. VERTEBRAL ARTERIES:Venous contamination limits assessment of proximal vertebral arteries. Left vertebral artery is dominant. Normal appearance of the vertebral arteries, which appear widely patent. Multilevel moderate degenerative disc severe RIGHT C4-5, severe LEFT C6-7 neural foraminal narrowing moderate to severe bilateral C7-T1 neural foraminal narrowing. Patient is edentulous. OTHER NECK: Soft tissues of the neck are non-acute though, not tailored for evaluation. Mild centrilobular emphysema and apical pleural scarring. 26 x 19 mm solid RIGHT thyroid nodule. CTA HEAD ANTERIOR CIRCULATION: Normal appearance of the cervical internal carotid arteries, petrous, cavernous and supra clinoid internal carotid arteries. Widely patent anterior communicating artery. Normal appearance of the anterior and middle cerebral arteries. Mild luminal regularity of the mid to distal anterior cerebral arteries and middle cerebral arteries. No large vessel occlusion, hemodynamically significant stenosis, dissection, contrast extravasation or aneurysm. POSTERIOR CIRCULATION: Normal appearance of the vertebral arteries, vertebrobasilar junction and basilar artery, as well as main branch vessels. Bilateral posterior cerebral arteries are patent. Mild stenosis LEFT P2 segment. No large vessel occlusion, hemodynamically significant stenosis, dissection, luminal irregularity, contrast extravasation or aneurysm.  VENOUS SINUSES: Major dural venous sinuses are patent though not tailored for evaluation on this angiographic examination. ANATOMIC VARIANTS: None. DELAYED PHASE: Not performed. IMPRESSION: CTA NECK: Atherosclerosis without hemodynamically significant stenosis or acute vascular process. 2.6 cm RIGHT thyroid nodule for which follow up thyroid sonogram is recommended on a nonemergent basis. CTA HEAD:  No emergent large vessel occlusion or severe stenosis. Mild luminal regularity/ stenosis of the intracranial vessels compatible with atherosclerosis. Electronically Signed   By: Elon Alas M.D.   On: 07/17/2016 02:02   Ct Angio Neck W Or Wo Contrast  Result Date: 07/17/2016 CLINICAL DATA:  Prior code stroke. Acute onset weakness, diaphoresis, headache and dizziness. History of hypertension and syncope. EXAM: CT ANGIOGRAPHY HEAD AND NECK TECHNIQUE: Multidetector CT imaging of the head and neck was performed using the standard protocol during bolus administration of intravenous contrast. Multiplanar CT image reconstructions and MIPs were obtained to evaluate the vascular anatomy. Carotid stenosis measurements (when applicable) are obtained utilizing NASCET criteria, using the distal internal carotid diameter as the denominator. CONTRAST:  50 cc Isovue 370 COMPARISON:  CT HEAD July 17, 2016 at 0049 hours FINDINGS: CTA NECK AORTIC ARCH: Normal appearance of the thoracic arch, Common origin innominate and LEFT Common carotid artery. Mild calcific atherosclerosis of the aortic arch and LEFT subclavian artery origin.  The origins of the innominate, left Common carotid artery and subclavian artery are widely patent. RIGHT CAROTID SYSTEM: Common carotid artery is widely patent, coursing in a straight line fashion. Normal appearance of the carotid bifurcation without hemodynamically significant stenosis by NASCET criteria. Normal appearance of the included internal carotid artery. LEFT CAROTID SYSTEM: Common carotid  artery is widely patent, coursing in a straight line fashion. Normal appearance of the carotid bifurcation without hemodynamically significant stenosis by NASCET criteria. Mild eccentric calcific atherosclerosis of LEFT internal carotid artery origin. Normal appearance of the included internal carotid artery. VERTEBRAL ARTERIES:Venous contamination limits assessment of proximal vertebral arteries. Left vertebral artery is dominant. Normal appearance of the vertebral arteries, which appear widely patent. Multilevel moderate degenerative disc severe RIGHT C4-5, severe LEFT C6-7 neural foraminal narrowing moderate to severe bilateral C7-T1 neural foraminal narrowing. Patient is edentulous. OTHER NECK: Soft tissues of the neck are non-acute though, not tailored for evaluation. Mild centrilobular emphysema and apical pleural scarring. 26 x 19 mm solid RIGHT thyroid nodule. CTA HEAD ANTERIOR CIRCULATION: Normal appearance of the cervical internal carotid arteries, petrous, cavernous and supra clinoid internal carotid arteries. Widely patent anterior communicating artery. Normal appearance of the anterior and middle cerebral arteries. Mild luminal regularity of the mid to distal anterior cerebral arteries and middle cerebral arteries. No large vessel occlusion, hemodynamically significant stenosis, dissection, contrast extravasation or aneurysm. POSTERIOR CIRCULATION: Normal appearance of the vertebral arteries, vertebrobasilar junction and basilar artery, as well as main branch vessels. Bilateral posterior cerebral arteries are patent. Mild stenosis LEFT P2 segment. No large vessel occlusion, hemodynamically significant stenosis, dissection, luminal irregularity, contrast extravasation or aneurysm. VENOUS SINUSES: Major dural venous sinuses are patent though not tailored for evaluation on this angiographic examination. ANATOMIC VARIANTS: None. DELAYED PHASE: Not performed. IMPRESSION: CTA NECK: Atherosclerosis without  hemodynamically significant stenosis or acute vascular process. 2.6 cm RIGHT thyroid nodule for which follow up thyroid sonogram is recommended on a nonemergent basis. CTA HEAD:  No emergent large vessel occlusion or severe stenosis. Mild luminal regularity/ stenosis of the intracranial vessels compatible with atherosclerosis. Electronically Signed   By: Elon Alas M.D.   On: 07/17/2016 02:02   Mr Brain Wo Contrast  Result Date: 07/17/2016 CLINICAL DATA:  Prior code stroke. Acute onset weakness, possible RIGHT-sided weakness, diaphoresis, headache and dizziness. History of hypertension and syncope. EXAM: MRI HEAD WITHOUT CONTRAST TECHNIQUE: Multiplanar, multiecho pulse sequences of the brain and surrounding structures were obtained without intravenous contrast. COMPARISON:  CT HEAD July 17, 2016 at 0049 hours FINDINGS: INTRACRANIAL CONTENTS: No reduced diffusion to suggest acute ischemia. No susceptibility artifact to suggest hemorrhage. The ventricles and sulci are normal for patient's age. Scattered subcentimeter supratentorial white matter FLAIR T2 hyperintensities most compatible with chronic small vessel ischemic disease, normal for patient's age. No suspicious parenchymal signal, masses or mass effect. No abnormal extra-axial fluid collections. No extra-axial masses though, contrast enhanced sequences would be more sensitive. Normal major intracranial vascular flow voids present at skull base. ORBITS: The included ocular globes and orbital contents are non-suspicious. SINUSES: Atretic RIGHT maxillary sinus compatible chronic sinusitis. Mastoid air cells are well aerated. SKULL/SOFT TISSUES: No abnormal sellar expansion. No suspicious calvarial bone marrow signal. Craniocervical junction maintained. Patient is edentulous. IMPRESSION: No acute intracranial process. Mild white matter changes most compatible with chronic small vessel ischemic disease. Electronically Signed   By: Elon Alas M.D.    On: 07/17/2016 05:02   Ct Head Code Stroke W/o Cm  Result Date: 07/17/2016 CLINICAL DATA:  Code stroke. Acute onset weakness prior to arrival, diaphoresis, headache, dizziness. History of hypertension, syncope. EXAM: CT HEAD WITHOUT CONTRAST TECHNIQUE: Contiguous axial images were obtained from the base of the skull through the vertex without intravenous contrast. COMPARISON:  CT HEAD January 23, 2012 FINDINGS: INTRACRANIAL CONTENTS: The ventricles and sulci are normal. No intraparenchymal hemorrhage, mass effect nor midline shift. No acute large vascular territory infarcts. No abnormal extra-axial fluid collections. Basal cisterns are patent. Trace calcific atherosclerosis of the carotid siphons. ORBITS: The included ocular globes and orbital contents are normal. SINUSES: The mastoid aircells and included paranasal sinuses are well-aerated. SKULL/SOFT TISSUES: No skull fracture. No significant soft tissue swelling. Moderate LEFT temporomandibular osteoarthrosis. ASPECTS [facility] Stroke Program Early CT Score, [email or web address]) - Ganglionic level infarction (caudate, lentiform nuclei, internal capsule, insula, M1-M3 cortex): 7 - Supraganglionic infarction (M4-M6 cortex): 3 Total score (0-10 with 10 being normal): 10 IMPRESSION: 1. No acute intracranial process ; negative CT HEAD for age. 2. ASPECTS score 10. Acute findings discussed with and reconfirmed by Dr.CHRISTOPHER POLLINA on 07/17/2016 at 12:50 am. Electronically Signed   By: Elon Alas M.D.   On: 07/17/2016 00:52       PHYSICAL EXAM Pleasant middle aged lady not in distress. . Afebrile. Head is nontraumatic. Neck is supple without bruit.    Cardiac exam no murmur or gallop. Lungs are clear to auscultation. Distal pulses are well felt. Neurological Exam ;  Awake  Alert oriented x 3. Normal speech and language.eye movements full without nystagmus.fundi were not visualized. Vision acuity and fields appear normal. Hearing is  normal. Palatal movements are normal. Face symmetric. Tongue midline. Normal strength, tone, reflexes and coordination. Normal sensation. Gait deferred.  ASSESSMENT/PLAN [name] is a 60 y.o. female with history of cigarette smoking and untreated hypertension presenting with vertigo, nausea and possible right-sided weakness. She did not receive IV t-PA due to unclear time of onset.   Posterior circulation  TIA likely small vessel disease  MRI  No acute stroke  CTA Head atherosclerosis without hemodynamically significant stenosis  CTA Neck atherosclerosis without hemodynamically significant stenosis. 2.6 cm right thyroid nodule 2D Echo  Left ventricle: The cavity size was normal. Wall thickness was   increased in a pattern of mild LVH. Systolic function was normal.   The estimated ejection fraction was in the range of 50% to 55%.   Wall motion was normal; there were no regional wall motion    abnormalities   LDL 85  HgbA1c pending  Lovenox 40 mg sq daily for VTE prophylaxis Diet Heart Room service appropriate? Yes; Fluid consistency: Thin  No antithrombotic prior to admission (had stopped due to GIB - bloody stools reported per her, stopped ASA and bleeding stopped - all about 1 months ago), now on aspirin 325 mg daily. Recommend change to plavix. If bleeding recurs, need GI evaluation  Patient counseled to be compliant with her antithrombotic medications  Ongoing aggressive stroke risk factor management  Therapy recommendations:  HH PT  Disposition:  Return home  Hypertension  Stable  Permissive hypertension (OK if < 220/120) but gradually normalize in 5-7 days  Long-term BP goal normotensive  Hyperlipidemia  Home meds:  No statin  LDL 85  Add low dose statin  Other Stroke Risk Factors  Cigarette smoker, advised to stop smoking  ETOH use, advised to drink no more than 1 drink(s) a day  Other Active Problems  Hypokalemia 3.0 replaced   2.6 cm  right thyroid nodule, needs outpatient  follow-up  Hospital day # 0  Radene Journey [facility] for Pager information 07/17/2016 5:50 PM  I have personally examined this patient, reviewed notes, independently viewed imaging studies, participated in medical decision making and plan of care. I have made any additions or clarifications directly to the above note. Agree with note above. She presented with posterior circulation TIA and has a negative neurovascular workup . Recommend Plavix for secondary stroke prevention and statin for elevated lipids. Greater than 50% of time during this 25 minute visit was spent on counseling and coordination of care about stroke and TIA risk and prevention. Discussed with Dr. Maxwell Marion, [place] Pager: [phone] 07/17/2016 5:57 PM    To contact Stroke Continuity provider, please refer to [email or web address] After hours, contact General Neurology

## 2016-07-17 NOTE — Progress Notes (Signed)
OT Cancellation Note  Patient Details Name: Rachel [name] MRN: [id number] DOB: 02-17-1956   Cancelled Treatment:    Reason Eval/Treat Not Completed: Patient at procedure or test/ unavailable (with MD. Will return)  [place], OTR/L  [phone] 07/17/2016 07/17/2016, 11:14 AM

## 2016-07-17 NOTE — ED Notes (Signed)
Pt failed RN swallow screen. BSE ordered.

## 2016-07-17 NOTE — Consult Note (Signed)
Neurology Consultation Reason for Consult: Vertigo, nausea Referring Physician: Pollina, C  CC: Vertigo  History is obtained from: Patient  HPI: Rachel [name] is a 60 y.o. female who presents with vertigo, nausea that was present on awakening. She went to bed sometime around 8:30 to 8:45 and states that she was not vertiginous when she laid down. She was, however, feeling "bad" but she is having very much difficulty describing to me exactly what she means by that. On awakening, she was nauseated and throwing up was assisted to the toilet but due to the severity of her symptoms 911 was called. En route, EMS noticed that she appeared to have some problems with her right side and therefore a code stroke was called  When I initially asked her what time she went to bed, she stated that she did not note, when I force her to guess she stated 8:30 PM, but no*could've been earlier and that she said "I don't know, yes."  LKW: Unclear, possibly 8:45pm not definite tpa given?: no, unclear time of onset   ROS: A 14 point ROS was performed and is negative except as noted in the HPI.   Past Medical History:  Diagnosis Date  . Current every day smoker    a. ~35 years - > has cut down to < 3/4 ppd  . History of UTI   . Hypertension   . Midsternal chest pain    a. 05/2014 Stress Echo: Ex time: 8:09, ECG w/o acute changes, EF nl with possible mid and apical anterior HK-->cath recommended.  . Syncope and collapse      Family History  Problem Relation Age of Onset  . Hyperlipidemia Father   . Heart disease Paternal Grandmother      Social History:  reports that she has been smoking Cigarettes.  She has a 8.75 pack-year smoking history. She does not have any smokeless tobacco history on file. She reports that she drinks alcohol. She reports that she does not use drugs.   Exam: Current vital signs: Blood pressure 173/94  Vital signs in last 24 hours:     Physical Exam  Constitutional:  Appears well-developed and well-nourished.  Psych: Affect appropriate to situation Eyes: No scleral injection HENT: No OP obstrucion Head: Normocephalic.  Cardiovascular: Normal rate and regular rhythm.  Respiratory: Effort normal and breath sounds normal to anterior ascultation GI: Soft.  No distension. There is no tenderness.  Skin: WDI  Neuro: Mental Status: Patient is drowsy but is able to answer questions and follow commands readily. She is oriented to person, place, month, year, and situation. Patient is able to give a clear and coherent history. No signs of aphasia or neglect Cranial Nerves: II: Visual Fields are full. Pupils are equal, round, and reactive to light.   III,IV, VI: EOMI without ptosis or diploplia.  V: Facial sensation is symmetric to temperature VII: Facial movement is symmetric VIII: hearing is intact to voice X: Uvula elevates symmetrically XI: Shoulder shrug is symmetric. XII: tongue is midline without atrophy or fasciculations.  Motor: She has mild weakness of the right upper extremity with drift, but to confrontation she does very well. She has full strength bilateral lower extremities and left arm. Sensory: Sensation is symmetric to light touch and temperature in the arms and legs. Cerebellar: She has difficulty with finger-nose-finger on the right, but this appears to be pretty consistent with weakness. She does not cooperate very well with finger-nose-finger in the lower extremities.   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 No tPA due to unclear time of onset, though likley around 8:45 pm. By the time this was known, it would not have been feasible to give tpa window in any case. It is possible that she could have a fairly large posterior circulation infarct and she will  need MRI/further workup.   Recommendations: 1. HgbA1c, fasting lipid panel 2. MRI  of the brain without contrast 3. Frequent neuro checks 4. Echocardiogram 5. Prophylactic therapy-Antiplatelet med: Aspirin - dose 325mg  PO or 300mg  PR 6. Risk factor modification 7. Telemetry monitoring 8. PT consult, OT consult, Speech consult 9. please page stroke NP  Or  PA  Or MD  from 8am -4 pm starting 8/7 as this patient will be followed by the stroke team at this point.   You can look them up on [email or web address]      Roland Rack, MD [facility] [phone]  If 7pm- 7am, please page neurology on call as listed in [place].

## 2016-07-17 NOTE — Progress Notes (Signed)
Occupational Therapy Evaluation Patient Details Name: Rachel [name] MRN: [id number] DOB: [birth year] Today's Date: 07/17/2016    History of Present Illness TAHIRI KOFOED a 60 y.o.femalewith a past medical history significant for smoking and untreated HTN. She was admitted with acute severe vertigo and possible R sided weakness. MRI negative for acute infarct.   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 Pt states she feels better this pm and feels that she is close to her baseline. Pt overall mod I to S with ADL tasks and scored a 23 on the DGI (not a risk for falls during functional tasks). Pt did not complain of dizziness with head turns this pm. Feel pt is safe to D/C home with initial S of her sister, who can provide 24/7 S if needed. Educated pt on warning signs/synptoms of stroke using FAST. Pt verbalized understanding. OT signing off.     Follow Up Recommendations    no OT follow up   Equipment Recommendations    none   Recommendations for Other Services       Precautions / Restrictions Precautions Precautions: Fall Precaution Comments: Dizziness and LOB with head turn to R side - per PT (did not observe with OT) Restrictions Weight Bearing Restrictions: No      Mobility Bed Mobility Overal bed mobility: mod I         Transfers Overall transfer level: mod I Equipment used: None            General transfer comment: no report of dizziness    Balance     Sitting balance-Leahy Scale: Good       Standing balance-Leahy Scale: good                   Standardized Balance Assessment Standardized Balance Assessment : Dynamic Gait Index   Dynamic Gait Index Level Surface: Normal Change in Gait Speed: Normal Gait with Horizontal Head Turns: Mild Impairment Gait with Vertical Head Turns: Normal Gait and Pivot Turn: Normal Step Over Obstacle: Normal Step Around Obstacles:  Normal Steps: Normal Total Score: 23      ADL Overall ADL's : Needs assistance/impaired Eating/Feeding: Independent   Grooming: Modified independent   Upper Body Bathing: Modified independent   Lower Body Bathing: Supervison/ safety;Sit to/from stand   Upper Body Dressing : Modified independent;Sitting   Lower Body Dressing: Supervision/safety   Toilet Transfer: Supervision/safety;Ambulation;Comfort height toilet   Toileting- Clothing Manipulation and Hygiene: Modified independent       Functional mobility during ADLs: Supervision/safety       Vision Vision Assessment?: Yes Eye Alignment: Within Functional Limits Ocular Range of Motion: Within Functional Limits Alignment/Gaze Preference: Within Defined Limits Tracking/Visual Pursuits: Able to track stimulus in all quads without difficulty Saccades: Within functional limits Convergence: Within functional limits Visual Fields: No apparent deficits   Perception     Praxis Praxis Praxis tested?: Within functional limits    Pertinent Vitals/Pain Pain Assessment: No/denies pain     Hand Dominance Left   Extremity/Trunk Assessment Upper Extremity Assessment Upper Extremity Assessment: Generalized weakness   Lower Extremity Assessment Lower Extremity Assessment: Defer to PT evaluation RLE Deficits / Details: 4-/5 for gross LE strength. Pt reports this as baseline due to a previous back injury.   Cervical / Trunk Assessment Cervical / Trunk Assessment: Normal   Communication Communication Communication: No difficulties   Cognition Arousal/Alertness: Awake/alert Behavior During Therapy: Flat affect Overall Cognitive Status:  Within Functional Limits for tasks assessed                     General Comments       Exercises       Shoulder Instructions      Home Living Family/patient expects to be discharged to:: Private residence Living Arrangements: Alone Available Help at Discharge: Family  (Sister lives 25 minutes away) Type of Home: House Home Access: Stairs to enter Technical brewer of Steps: 1 Entrance Stairs-Rails: None Home Layout: One level     Bathroom Shower/Tub: Teacher, early years/pre: Standard     Home Equipment: None          Prior Functioning/Environment Level of Independence: Independent        Comments: Previous history of R LE weakness due to back complications. Works as Geographical information systems officer at [employer] Diagnosis: Generalized weakness   OT Problem List: Decreased activity tolerance   OT Treatment/Interventions:      OT Goals(Current goals can be found in the care plan section) Acute Rehab OT Goals Patient Stated Goal: to go home OT Goal Formulation: All assessment and education complete, DC therapy  OT Frequency:     Barriers to D/C:            Co-evaluation              End of Session Equipment Utilized During Treatment: Gait belt Nurse Communication: Mobility status  Activity Tolerance: Patient tolerated treatment well Patient left: in bed;with call bell/phone within reach;with bed alarm set   Time: 1422-1444 OT Time Calculation (min): 22 min Charges:  OT General Charges $OT Visit: 1 Procedure OT Evaluation $OT Eval Low Complexity: 1 Procedure G-Codes: OT G-codes **NOT FOR INPATIENT CLASS** Functional Assessment Tool Used: clinical judgement Functional Limitation: Self care Self Care Current Status [id number]): At least 1 percent but less than 20 percent impaired, limited or restricted Self Care Goal Status [id number]): At least 1 percent but less than 20 percent impaired, limited or restricted Self Care Discharge Status [id number]): At least 1 percent but less than 20 percent impaired, limited or restricted  Jais Demir,HILLARY 07/17/2016, 2:59 PM   [facility], OTR/L  [phone] 07/17/2016

## 2016-07-17 NOTE — Evaluation (Signed)
Clinical/Bedside Swallow Evaluation Patient Details  Name: GEMMA LEGGIO MRN: [id number] Date of Birth: 1956/12/04  Today's Date: 07/17/2016 Time: SLP Start Time (ACUTE ONLY): [id number] SLP Stop Time (ACUTE ONLY): 0830 SLP Time Calculation (min) (ACUTE ONLY): 18 min  Past Medical History:  Past Medical History:  Diagnosis Date  . Current every day smoker    a. ~35 years - > has cut down to < 3/4 ppd  . History of UTI   . Hypertension   . Midsternal chest pain    a. 05/2014 Stress Echo: Ex time: 8:09, ECG w/o acute changes, EF nl with possible mid and apical anterior HK-->cath recommended.  . Syncope and collapse    Past Surgical History:  Past Surgical History:  Procedure Laterality Date  . BACK SURGERY    . CHOLECYSTECTOMY    . PARTIAL HYSTERECTOMY     HPI:  pt is a 60 yo female adm to [facility] with dizziness, ? right sided weakness. Pt has h/o smoking (current smoker) and has cut down to 3/4 pack per day.  MRI negative.  Pt works, lives alone and is LEFT HANDED.  Speech and swallow evaluation ordered.    Assessment / Plan / Recommendation Clinical Impression  Pt presents with functional oropharyngeal swallow - No indication of aspiration/penetration with all po observed.  Voice remained clear throughout all po.  Pt does admit to premorbid large dysphagia for which pt educated to compensation strategies.      Aspiration Risk  Mild aspiration risk    Diet Recommendation Regular;Thin liquid   Liquid Administration via: Cup;Straw Medication Administration: Whole meds with liquid Supervision: Patient able to self feed Compensations: Slow rate;Small sips/bites Postural Changes: Seated upright at 90 degrees;Remain upright for at least 30 minutes after po intake    Other  Recommendations Oral Care Recommendations: Oral care BID   Follow up Recommendations  None    Frequency and Duration            Prognosis        Swallow Study   General Date of Onset: 07/17/16 HPI: pt is a  60 yo female adm to [facility] with dizziness, ? right sided weakness. Pt has h/o smoking (current smoker) and has cut down to 3/4 pack per day.  MRI negative.  Pt works, lives alone and is LEFT HANDED.  Speech and swallow evaluation ordered.  Type of Study: Bedside Swallow Evaluation Diet Prior to this Study: NPO Temperature Spikes Noted: No Respiratory Status: Room air History of Recent Intubation: No Behavior/Cognition: Alert;Cooperative;Pleasant mood Oral Cavity Assessment: Within Functional Limits Oral Care Completed by SLP: No Oral Cavity - Dentition: Dentures, top;Dentures, bottom Vision: Functional for self-feeding Self-Feeding Abilities: Able to feed self Patient Positioning: Upright in bed Baseline Vocal Quality: Normal Volitional Cough: Strong Volitional Swallow: Able to elicit    Oral/Motor/Sensory Function Overall Oral Motor/Sensory Function: Mild impairment Facial Sensation: Reduced right (lower and middle facial tract)   Ice Chips Ice chips: Not tested   Thin Liquid Thin Liquid: Within functional limits Presentation: Straw;Self Fed;Cup    Nectar Thick Nectar Thick Liquid: Not tested   Honey Thick Honey Thick Liquid: Not tested   Puree Puree: Within functional limits Presentation: Self Fed;Spoon   Solid   GO   Solid: Within functional limits Presentation: Self Fed    Functional Assessment Tool Used: MOCA Functional Limitations: Memory Memory Current Status [id number]): At least 1 percent but less than 20 percent impaired, limited or restricted Memory Goal Status [id number]): At least  1 percent but less than 20 percent impaired, limited or restricted Memory Discharge Status [phone]): At least 1 percent but less than 20 percent impaired, limited or restricted   Claudie Fisherman, [facility] SLP [phone]

## 2016-07-17 NOTE — Progress Notes (Signed)
Pt for discharge home today. Discharge orders received. IV and telemetry dcd. Discharge instructions and prescriptions given with verbalized understanding. Family at bedside to assist with discharge. Staff brought patient to lobby via wheelchair at this time. Transported to home by family member.

## 2016-07-17 NOTE — ED Provider Notes (Signed)
[facility] DEPT Provider Note   CSN: [id number] Arrival date & time: 07/17/16  0033  By signing my name below, I, Irene Pap, attest that this documentation has been prepared under the direction and in the presence of Orpah Greek, MD. Electronically Signed: Irene Pap, ED Scribe. 07/17/16. 12:44 AM.  First Provider Contact:  None   History   Chief Complaint Chief Complaint  Patient presents with  . Code Stroke   The history is provided by the EMS personnel. No language interpreter was used.  HPI Comments (Level 5 Caveat due to altered mental status): Rachel [name] is a 60 y.o. Female with a hx of HTN, syncope and collapse brought in by EMS who presents to the Emergency Department complaining of sudden onset weakness onset PTA. Family called EMS at 11:31 PM and EMS arrived at 11:42 PM. EMS was initially called because family noted pt was diaphoretic, complaining of headache, room spinning dizziness, and having diarrhea. Upon arrival, pt's BP was 200/110 manually and 186/110 by monitor. EMS did a stroke screening on pt where she had right sided facial droop and left sided weakness. Code Stroke was called en route. They state that pt has not been very responsive to questioning. Pt smokes 1.5 ppd and had a diagnosed heart blockage. She has no hx of stroke.   Upon further questioning, pt states that she went to bed and woke up with the symptoms. She believes that she may have went to bed at 8:30 PM but last seen normal is uncertain.    Past Medical History:  Diagnosis Date  . Current every day smoker    a. ~35 years - > has cut down to < 3/4 ppd  . History of UTI   . Hypertension   . Midsternal chest pain    a. 05/2014 Stress Echo: Ex time: 8:09, ECG w/o acute changes, EF nl with possible mid and apical anterior HK-->cath recommended.  . Syncope and collapse     Patient Active Problem List   Diagnosis Date Noted  . Midsternal chest pain 06/03/2014  . Chest pain  with moderate risk for cardiac etiology 05/08/2014  . Rapid heart beat 05/08/2014  . Current every day smoker   . Hypertension     Past Surgical History:  Procedure Laterality Date  . BACK SURGERY    . CHOLECYSTECTOMY    . PARTIAL HYSTERECTOMY      OB History    No data available       Home Medications    Prior to Admission medications   Medication Sig Start Date End Date Taking? Authorizing Provider  ibuprofen (ADVIL,MOTRIN) 800 MG tablet Take 800 mg by mouth 4 (four) times daily.  06/02/14   Historical Provider, MD  isosorbide mononitrate (IMDUR) 30 MG 24 hr tablet Take 1 tablet (30 mg total) by mouth daily. 06/03/14   Rogelia Mire, NP  metoprolol tartrate (LOPRESSOR) 25 MG tablet Take 0.5 tablets (12.5 mg total) by mouth 2 (two) times daily. 06/03/14   Rogelia Mire, NP  nitroGLYCERIN (NITROSTAT) 0.4 MG SL tablet Place 1 tablet (0.4 mg total) under the tongue every 5 (five) minutes as needed for chest pain. 06/03/14   Rogelia Mire, NP  oxyCODONE-acetaminophen (PERCOCET) 7.5-325 MG per tablet Take 1 tablet by mouth as needed.  06/02/14   Historical Provider, MD    Family History Family History  Problem Relation Age of Onset  . Hyperlipidemia Father   . Heart disease Paternal Grandmother  Social History Social History  Substance Use Topics  . Smoking status: Current Every Day Smoker    Packs/day: 0.25    Years: 35.00    Types: Cigarettes  . Smokeless tobacco: Not on file  . Alcohol use Yes     Comment: occasional     Allergies   Review of patient's allergies indicates no known allergies.   Review of Systems Review of Systems  Unable to perform ROS: Mental status change   Physical Exam Updated Vital Signs There were no vitals taken for this visit.  Physical Exam  Constitutional: She appears well-developed and well-nourished. No distress.  HENT:  Head: Normocephalic and atraumatic.  Right Ear: Hearing normal.  Left Ear: Hearing  normal.  Nose: Nose normal.  Mouth/Throat: Oropharynx is clear and moist and mucous membranes are normal.  Eyes: Conjunctivae and EOM are normal. Pupils are equal, round, and reactive to light.  Neck: Normal range of motion. Neck supple.  Cardiovascular: Regular rhythm, S1 normal and S2 normal.  Exam reveals no gallop and no friction rub.   No murmur heard. Pulmonary/Chest: Effort normal and breath sounds normal. No respiratory distress. She exhibits no tenderness.  Abdominal: Soft. Normal appearance and bowel sounds are normal. There is no hepatosplenomegaly. There is no tenderness. There is no rebound, no guarding, no tenderness at McBurney's point and negative Murphy's sign. No hernia.  Musculoskeletal: Normal range of motion.  Neurological: She is alert. She has normal strength. No cranial nerve deficit or sensory deficit. Coordination normal. GCS eye subscore is 4. GCS verbal subscore is 5. GCS motor subscore is 6.  Skin: Skin is warm, dry and intact. No rash noted. No cyanosis.  Psychiatric: She has a normal mood and affect. Her speech is normal and behavior is normal. Thought content normal.  Nursing note and vitals reviewed.    ED Treatments / Results  DIAGNOSTIC STUDIES:   COORDINATION OF CARE: 12:44 AM- labs and CT scan  Labs (all labs ordered are listed, but only abnormal results are displayed) Labs Reviewed  CBC - Abnormal; Notable for the following:       Result Value   WBC 10.9 (*)    All other components within normal limits  COMPREHENSIVE METABOLIC PANEL - Abnormal; Notable for the following:    Potassium 2.9 (*)    Glucose, Bld 128 (*)    Calcium 8.7 (*)    ALT 11 (*)    Anion gap 4 (*)    All other components within normal limits  I-STAT CHEM 8, ED - Abnormal; Notable for the following:    Potassium 3.0 (*)    Glucose, Bld 128 (*)    Calcium, Ion 1.04 (*)    All other components within normal limits  ETHANOL  PROTIME-INR  APTT  DIFFERENTIAL  URINE  RAPID DRUG SCREEN, HOSP PERFORMED  URINALYSIS, ROUTINE W REFLEX MICROSCOPIC (NOT AT [facility])  I-STAT TROPOININ, ED    EKG  EKG Interpretation  Date/Time:  Monday July 17 2016 00:52:52 EDT Ventricular Rate:  58 PR Interval:    QRS Duration: 110 QT Interval:  543 QTC Calculation: 534 R Axis:   -3 Text Interpretation:  Sinus rhythm LVH with secondary repolarization abnormality Prolonged QT interval No significant change since last tracing Confirmed by Sandar Krinke  MD, Jahrel Borthwick [phone]) on 07/17/2016 1:37:06 AM       Radiology Ct Head Code Stroke W/o Cm  Result Date: 07/17/2016 CLINICAL DATA:  Code stroke. Acute onset weakness prior to arrival, diaphoresis, headache,  dizziness. History of hypertension, syncope. EXAM: CT HEAD WITHOUT CONTRAST TECHNIQUE: Contiguous axial images were obtained from the base of the skull through the vertex without intravenous contrast. COMPARISON:  CT HEAD January 23, 2012 FINDINGS: INTRACRANIAL CONTENTS: The ventricles and sulci are normal. No intraparenchymal hemorrhage, mass effect nor midline shift. No acute large vascular territory infarcts. No abnormal extra-axial fluid collections. Basal cisterns are patent. Trace calcific atherosclerosis of the carotid siphons. ORBITS: The included ocular globes and orbital contents are normal. SINUSES: The mastoid aircells and included paranasal sinuses are well-aerated. SKULL/SOFT TISSUES: No skull fracture. No significant soft tissue swelling. Moderate LEFT temporomandibular osteoarthrosis. ASPECTS [facility] Stroke Program Early CT Score, [email or web address]) - Ganglionic level infarction (caudate, lentiform nuclei, internal capsule, insula, M1-M3 cortex): 7 - Supraganglionic infarction (M4-M6 cortex): 3 Total score (0-10 with 10 being normal): 10 IMPRESSION: 1. No acute intracranial process ; negative CT HEAD for age. 2. ASPECTS score 10. Acute findings discussed with and reconfirmed by Dr.Deklin Bieler on  07/17/2016 at 12:50 am. Electronically Signed   By: Elon Alas M.D.   On: 07/17/2016 00:52    Procedures Procedures (including critical care time)  Medications Ordered in ED Medications  ondansetron (ZOFRAN) injection 4 mg (4 mg Intravenous Given 07/17/16 0121)     Initial Impression / Assessment and Plan / ED Course  I have reviewed the triage vital signs and the nursing notes.  Pertinent labs & imaging results that were available during my care of the patient were reviewed by me and considered in my medical decision making (see chart for details).  Clinical Course   Patient brought to emergency department by EMS as a code stroke. Last seen normal time is unclear. Patient awakened with symptoms, having gone to bed around 8:30 or 8:45 PM. Patient initially called EMS because of weakness and feeling like she was going to pass out after having diarrhea. She was hypertensive and a stroke screen revealed right-sided deficit. Dr. Leonel Ramsay recommended allowing BP up to 220/120.  Upon arrival to the emergency department, timing of onset of symptoms was of concern. C normal cannot be ascertained. She went to bed at 8:30 or 8:45 PM, but might have been symptomatic prior to that as well. She was therefore not a candidate for TPA.  Initial CT did not reveal any acute abnormality. Patient will be admitted for further stroke workup.  CRITICAL CARE Performed by: Orpah Greek   Total critical care time: 30 minutes  Critical care time was exclusive of separately billable procedures and treating other patients.  Critical care was necessary to treat or prevent imminent or life-threatening deterioration.  Critical care was time spent personally by me on the following activities: development of treatment plan with patient and/or surrogate as well as nursing, discussions with consultants, evaluation of patient's response to treatment, examination of patient, obtaining history from  patient or surrogate, ordering and performing treatments and interventions, ordering and review of laboratory studies, ordering and review of radiographic studies, pulse oximetry and re-evaluation of patient's condition.   Final Clinical Impressions(s) / ED Diagnoses   Final diagnoses:  Stroke (cerebrum) ([place])  Stroke (cerebrum) ([place])  I personally performed the services described in this documentation, which was scribed in my presence. The recorded information has been reviewed and is accurate.    New Prescriptions New Prescriptions   No medications on file     Orpah Greek, MD 07/17/16 [phone]

## 2016-07-17 NOTE — Evaluation (Signed)
Physical Therapy Evaluation Patient Details Name: Rachel [name] MRN: [id number] DOB: December 10, 1956 Today's Date: 07/17/2016   History of Present Illness  Rachel [name] a 60 y.o.femalewith a past medical history significant for smoking and untreated HTN. She was admitted with acute severe vertigo and possible R sided weakness. MRI negative for acute infarct.  Clinical Impression  Pt admitted with above. Pt with extremely flat affect and reports this feeling of the room spinning to have woken her up out of sleep and lasted hours however has since dissipated. Pt denied dizziness/room spining throughout session except during head to the R with head thrust test and turning head to the R and up to ceiling during ambulation. Pt with no nystagmus t/o session. See vestibular assessment in general comment section. Pt with noted vestibular hypofunction and given VORx1 exercises. Pt appears to have depressed mood and perked up when talking about her 2 dogs. Talked to charge RN regarding protocol of bring dogs into hospital and relayed info to patient. At this time recommending 24/7 supervision for safe d/c home however pending pt progression this can be re-evaluated.    Follow Up Recommendations Home health PT;Supervision/Assistance - 24 hour    Equipment Recommendations  None recommended by PT    Recommendations for Other Services       Precautions / Restrictions Precautions Precautions: Fall Precaution Comments: Dizziness and LOB with head turn to R side Restrictions Weight Bearing Restrictions: No      Mobility  Bed Mobility Overal bed mobility: Needs Assistance Bed Mobility: Rolling;Sidelying to Sit Rolling: Supervision Sidelying to sit: Supervision       General bed mobility comments: Increased time for bed mobility. No report of dizziness.  Transfers Overall transfer level: Needs assistance Equipment used: None Transfers: Sit to/from Stand Sit to Stand: Min guard          General transfer comment: no report of dizziness  Ambulation/Gait Ambulation/Gait assistance: Min guard;Min assist (min A for correction with LOB) Ambulation Distance (Feet): 100 Feet Assistive device: None Gait Pattern/deviations: Step-through pattern;Decreased stride length Gait velocity: decreased Gait velocity interpretation: Below normal speed for age/gender General Gait Details: Pt min guard for ambulation. LOB with head turn to R, and when looking up. Min A to recover from LOB.[name] pt with arms in guarded position, slow cautious gait  Stairs            Wheelchair Mobility    Modified Rankin (Stroke Patients Only) Modified Rankin (Stroke Patients Only) Pre-Morbid Rankin Score: No symptoms Modified Rankin: Slight disability     Balance Overall balance assessment: Needs assistance Sitting-balance support: No upper extremity supported;Feet supported Sitting balance-Leahy Scale: Good     Standing balance support: No upper extremity supported Standing balance-Leahy Scale: Fair                               Pertinent Vitals/Pain Pain Assessment: No/denies pain    Home Living Family/patient expects to be discharged to:: Private residence Living Arrangements: Alone Available Help at Discharge: Family (Sister lives 25 minutes away) Type of Home: House Home Access: Stairs to enter Entrance Stairs-Rails: None Technical brewer of Steps: 1 Home Layout: One level Home Equipment: None      Prior Function Level of Independence: Independent         Comments: Previous history of R LE weakness due to back complications     Hand Dominance   Dominant Hand: Left  Extremity/Trunk Assessment   Upper Extremity Assessment: Generalized weakness           Lower Extremity Assessment: RLE deficits/detail RLE Deficits / Details: 4-/5 for gross LE strength. Pt reports this as baseline due to a previous back injury.    Cervical / Trunk  Assessment: Normal  Communication   Communication: No difficulties  Cognition Arousal/Alertness: Awake/alert Behavior During Therapy: Flat affect Overall Cognitive Status: Within Functional Limits for tasks assessed                      General Comments General comments (skin integrity, edema, etc.): Vestibular assessment: Pt unable to track finger, to the R worse than the L.  negative nystagmus or dizziness with rolling however + dizziness with head turn to the R when walking or in sitting and turning head to R. Pt unable to stay focused on PT's nose during head thrust test.  Pt treated for R horizontal canal BPPV however during modified Gufoni's manuever no nystagmus or c/o of dizziness from patient. Pt given VORx1 exercises to complete 3x, 3x/day. pt with good return demonstration and verbal confirmation of understanding    Exercises        Assessment/Plan    PT Assessment Patient needs continued PT services  PT Diagnosis Difficulty walking;Abnormality of gait;Generalized weakness (Dizziness)   PT Problem List Decreased strength;Decreased activity tolerance;Decreased balance;Decreased range of motion;Decreased mobility;Decreased coordination;Decreased safety awareness (vestibular hypofunction)  PT Treatment Interventions Gait training;Stair training;Functional mobility training;Therapeutic activities;Therapeutic exercise;Balance training;Neuromuscular re-education;Patient/family education   PT Goals (Current goals can be found in the Care Plan section) Acute Rehab PT Goals Patient Stated Goal: to get back home and fix dizziness PT Goal Formulation: With patient Time For Goal Achievement: 07/24/16 Potential to Achieve Goals: Good Additional Goals Additional Goal #1: Pt to be indep with VORx1 exercises and able to tolerate  them for 1 minute.    Frequency Min 4X/week   Barriers to discharge Decreased caregiver support lives alone, sister lives 20-25 min away     Co-evaluation               End of Session Equipment Utilized During Treatment: Gait belt Activity Tolerance: Patient tolerated treatment well Patient left: in chair;with call bell/phone within reach;with chair alarm set Nurse Communication: Mobility status    Functional Assessment Tool Used: clinical judgement Functional Limitation: Mobility: Walking and moving around Mobility: Walking and Moving Around Current Status [phone]): At least 20 percent but less than 40 percent impaired, limited or restricted Mobility: Walking and Moving Around Goal Status [phone]): At least 1 percent but less than 20 percent impaired, limited or restricted    Time: 0846-0925 PT Time Calculation (min) (ACUTE ONLY): 39 min   Charges:   PT Evaluation $PT Eval High Complexity: 1 Procedure PT Treatments $Gait Training: 8-22 mins $Canalith Rep Proc: 8-22 mins   PT G Codes:   PT G-Codes **NOT FOR INPATIENT CLASS** Functional Assessment Tool Used: clinical judgement Functional Limitation: Mobility: Walking and moving around Mobility: Walking and Moving Around Current Status [id number]): At least 20 percent but less than 40 percent impaired, limited or restricted Mobility: Walking and Moving Around Goal Status [phone]): At least 1 percent but less than 20 percent impaired, limited or restricted    Kingsley Callander 07/17/2016, 10:44 AM  Kittie Plater, PT, DPT Pager #: [phone] Office #: [phone]

## 2016-07-17 NOTE — Progress Notes (Signed)
Patient admitted from E.D  Via stretcher Welcomed to room and made comfortable. Oriented to room and unit. Family at bedside.

## 2016-07-17 NOTE — ED Triage Notes (Signed)
Per EMS: Pt called out for  "feeling sick". Upon EMS arrival, pt with R sided facial droop and L sided weakness. Pt with minimal verbal response.

## 2016-07-17 NOTE — Discharge Summary (Signed)
Physician Discharge Summary  Rachel [name] [id number] DOB: 1956-07-12 DOA: 07/17/2016  PCP: Lynnell Jude, MD  Admit date: 07/17/2016 Discharge date: 07/17/2016  Admitted From: home Disposition:  home    Home Health:  None needs     Discharge Condition:  stable   CODE STATUS:  Full code   Diet recommendation:  Heart healthy diet Consultations:  neurology    Discharge Diagnoses:  Principal Problem:   Vertigo Active Problems:   Current every day smoker   Essential hypertension   Hypokalemia    Subjective: Vertigo has resolved. No new symptoms.   Brief Summary: Rachel [name] is a 60 y.o. female with a past medical history significant for smoking and untreated HTN who presents with acute severe vertigo and possible R sided weakness.  The patient was in her usual state of health until this evening when she went to bed (around 8:30P to the best of her knowledge) with vague malaise.  She then woke around 11:30PM with severe vertigo, had to hold the walls to get to the bathroom, and was found by her sister hollering and vomiting and sweating profusely, standing at the commode holding the wall asking to go to the hospital.  EMS believe she had BP 200/110 and was thought to have unilateral weakness and facial droop and so [facility] was called en route.   Hospital Course:  Vertigo - MRI negative for CVA, symptoms resolved in < 12 hrs - suspected TIA- start daily plavix as recommended by stroke team  HTN - cont home medications  Nicotine abuse - advised to stop smoking to prevent further strokes  Hypokalemia - replaced  Discharge Instructions  Discharge Instructions    Diet - low sodium heart healthy    Complete by:  As directed   Increase activity slowly    Complete by:  As directed       Medication List    TAKE these medications   clopidogrel 75 MG tablet Commonly known as:  PLAVIX Take 1 tablet (75 mg total) by mouth daily. Start taking on:   07/18/2016   isosorbide mononitrate 30 MG 24 hr tablet Commonly known as:  IMDUR Take 1 tablet (30 mg total) by mouth daily.   metoprolol tartrate 25 MG tablet Commonly known as:  LOPRESSOR Take 0.5 tablets (12.5 mg total) by mouth 2 (two) times daily.   nitroGLYCERIN 0.4 MG SL tablet Commonly known as:  NITROSTAT Place 1 tablet (0.4 mg total) under the tongue every 5 (five) minutes as needed for chest pain.   shark liver oil-cocoa butter 0.25-3-85.5 % suppository Commonly known as:  PREPARATION H Place 1 suppository rectally daily as needed for hemorrhoids.   traMADol 50 MG tablet Commonly known as:  ULTRAM Take 50 mg by mouth every 6 (six) hours as needed for moderate pain.       No Known Allergies   Procedures/Studies: 2 D ECHO 8/17 Study Conclusions  - Left ventricle: The cavity size was normal. Wall thickness was   increased in a pattern of mild LVH. Systolic function was normal.   The estimated ejection fraction was in the range of 50% to 55%.   Wall motion was normal; there were no regional wall motion   abnormalities. Doppler parameters are consistent with abnormal   left ventricular relaxation (grade 1 diastolic dysfunction).   Doppler parameters are consistent with high ventricular filling   pressure. - Aortic valve: There was trivial regurgitation.  Impressions:  - Low normal LV  systolic function; grade 1 diastolic dysfunction   with elevated LV filling pressure; trace AI and MR; mild TR.  Ct Angio Head W Or Wo Contrast  Result Date: 07/17/2016 CLINICAL DATA:  Prior code stroke. Acute onset weakness, diaphoresis, headache and dizziness. History of hypertension and syncope. EXAM: CT ANGIOGRAPHY HEAD AND NECK TECHNIQUE: Multidetector CT imaging of the head and neck was performed using the standard protocol during bolus administration of intravenous contrast. Multiplanar CT image reconstructions and MIPs were obtained to evaluate the vascular anatomy. Carotid  stenosis measurements (when applicable) are obtained utilizing NASCET criteria, using the distal internal carotid diameter as the denominator. CONTRAST:  50 cc Isovue 370 COMPARISON:  CT HEAD July 17, 2016 at 0049 hours FINDINGS: CTA NECK AORTIC ARCH: Normal appearance of the thoracic arch, Common origin innominate and LEFT Common carotid artery. Mild calcific atherosclerosis of the aortic arch and LEFT subclavian artery origin. The origins of the innominate, left Common carotid artery and subclavian artery are widely patent. RIGHT CAROTID SYSTEM: Common carotid artery is widely patent, coursing in a straight line fashion. Normal appearance of the carotid bifurcation without hemodynamically significant stenosis by NASCET criteria. Normal appearance of the included internal carotid artery. LEFT CAROTID SYSTEM: Common carotid artery is widely patent, coursing in a straight line fashion. Normal appearance of the carotid bifurcation without hemodynamically significant stenosis by NASCET criteria. Mild eccentric calcific atherosclerosis of LEFT internal carotid artery origin. Normal appearance of the included internal carotid artery. VERTEBRAL ARTERIES:Venous contamination limits assessment of proximal vertebral arteries. Left vertebral artery is dominant. Normal appearance of the vertebral arteries, which appear widely patent. Multilevel moderate degenerative disc severe RIGHT C4-5, severe LEFT C6-7 neural foraminal narrowing moderate to severe bilateral C7-T1 neural foraminal narrowing. Patient is edentulous. OTHER NECK: Soft tissues of the neck are non-acute though, not tailored for evaluation. Mild centrilobular emphysema and apical pleural scarring. 26 x 19 mm solid RIGHT thyroid nodule. CTA HEAD ANTERIOR CIRCULATION: Normal appearance of the cervical internal carotid arteries, petrous, cavernous and supra clinoid internal carotid arteries. Widely patent anterior communicating artery. Normal appearance of the  anterior and middle cerebral arteries. Mild luminal regularity of the mid to distal anterior cerebral arteries and middle cerebral arteries. No large vessel occlusion, hemodynamically significant stenosis, dissection, contrast extravasation or aneurysm. POSTERIOR CIRCULATION: Normal appearance of the vertebral arteries, vertebrobasilar junction and basilar artery, as well as main branch vessels. Bilateral posterior cerebral arteries are patent. Mild stenosis LEFT P2 segment. No large vessel occlusion, hemodynamically significant stenosis, dissection, luminal irregularity, contrast extravasation or aneurysm. VENOUS SINUSES: Major dural venous sinuses are patent though not tailored for evaluation on this angiographic examination. ANATOMIC VARIANTS: None. DELAYED PHASE: Not performed. IMPRESSION: CTA NECK: Atherosclerosis without hemodynamically significant stenosis or acute vascular process. 2.6 cm RIGHT thyroid nodule for which follow up thyroid sonogram is recommended on a nonemergent basis. CTA HEAD:  No emergent large vessel occlusion or severe stenosis. Mild luminal regularity/ stenosis of the intracranial vessels compatible with atherosclerosis. Electronically Signed   By: Elon Alas M.D.   On: 07/17/2016 02:02   Ct Angio Neck W Or Wo Contrast  Result Date: 07/17/2016 CLINICAL DATA:  Prior code stroke. Acute onset weakness, diaphoresis, headache and dizziness. History of hypertension and syncope. EXAM: CT ANGIOGRAPHY HEAD AND NECK TECHNIQUE: Multidetector CT imaging of the head and neck was performed using the standard protocol during bolus administration of intravenous contrast. Multiplanar CT image reconstructions and MIPs were obtained to evaluate the vascular anatomy. Carotid stenosis measurements (when  applicable) are obtained utilizing NASCET criteria, using the distal internal carotid diameter as the denominator. CONTRAST:  50 cc Isovue 370 COMPARISON:  CT HEAD July 17, 2016 at 0049 hours  FINDINGS: CTA NECK AORTIC ARCH: Normal appearance of the thoracic arch, Common origin innominate and LEFT Common carotid artery. Mild calcific atherosclerosis of the aortic arch and LEFT subclavian artery origin. The origins of the innominate, left Common carotid artery and subclavian artery are widely patent. RIGHT CAROTID SYSTEM: Common carotid artery is widely patent, coursing in a straight line fashion. Normal appearance of the carotid bifurcation without hemodynamically significant stenosis by NASCET criteria. Normal appearance of the included internal carotid artery. LEFT CAROTID SYSTEM: Common carotid artery is widely patent, coursing in a straight line fashion. Normal appearance of the carotid bifurcation without hemodynamically significant stenosis by NASCET criteria. Mild eccentric calcific atherosclerosis of LEFT internal carotid artery origin. Normal appearance of the included internal carotid artery. VERTEBRAL ARTERIES:Venous contamination limits assessment of proximal vertebral arteries. Left vertebral artery is dominant. Normal appearance of the vertebral arteries, which appear widely patent. Multilevel moderate degenerative disc severe RIGHT C4-5, severe LEFT C6-7 neural foraminal narrowing moderate to severe bilateral C7-T1 neural foraminal narrowing. Patient is edentulous. OTHER NECK: Soft tissues of the neck are non-acute though, not tailored for evaluation. Mild centrilobular emphysema and apical pleural scarring. 26 x 19 mm solid RIGHT thyroid nodule. CTA HEAD ANTERIOR CIRCULATION: Normal appearance of the cervical internal carotid arteries, petrous, cavernous and supra clinoid internal carotid arteries. Widely patent anterior communicating artery. Normal appearance of the anterior and middle cerebral arteries. Mild luminal regularity of the mid to distal anterior cerebral arteries and middle cerebral arteries. No large vessel occlusion, hemodynamically significant stenosis, dissection, contrast  extravasation or aneurysm. POSTERIOR CIRCULATION: Normal appearance of the vertebral arteries, vertebrobasilar junction and basilar artery, as well as main branch vessels. Bilateral posterior cerebral arteries are patent. Mild stenosis LEFT P2 segment. No large vessel occlusion, hemodynamically significant stenosis, dissection, luminal irregularity, contrast extravasation or aneurysm. VENOUS SINUSES: Major dural venous sinuses are patent though not tailored for evaluation on this angiographic examination. ANATOMIC VARIANTS: None. DELAYED PHASE: Not performed. IMPRESSION: CTA NECK: Atherosclerosis without hemodynamically significant stenosis or acute vascular process. 2.6 cm RIGHT thyroid nodule for which follow up thyroid sonogram is recommended on a nonemergent basis. CTA HEAD:  No emergent large vessel occlusion or severe stenosis. Mild luminal regularity/ stenosis of the intracranial vessels compatible with atherosclerosis. Electronically Signed   By: Elon Alas M.D.   On: 07/17/2016 02:02   Mr Brain Wo Contrast  Result Date: 07/17/2016 CLINICAL DATA:  Prior code stroke. Acute onset weakness, possible RIGHT-sided weakness, diaphoresis, headache and dizziness. History of hypertension and syncope. EXAM: MRI HEAD WITHOUT CONTRAST TECHNIQUE: Multiplanar, multiecho pulse sequences of the brain and surrounding structures were obtained without intravenous contrast. COMPARISON:  CT HEAD July 17, 2016 at 0049 hours FINDINGS: INTRACRANIAL CONTENTS: No reduced diffusion to suggest acute ischemia. No susceptibility artifact to suggest hemorrhage. The ventricles and sulci are normal for patient's age. Scattered subcentimeter supratentorial white matter FLAIR T2 hyperintensities most compatible with chronic small vessel ischemic disease, normal for patient's age. No suspicious parenchymal signal, masses or mass effect. No abnormal extra-axial fluid collections. No extra-axial masses though, contrast enhanced  sequences would be more sensitive. Normal major intracranial vascular flow voids present at skull base. ORBITS: The included ocular globes and orbital contents are non-suspicious. SINUSES: Atretic RIGHT maxillary sinus compatible chronic sinusitis. Mastoid air cells are well aerated. SKULL/SOFT TISSUES:  No abnormal sellar expansion. No suspicious calvarial bone marrow signal. Craniocervical junction maintained. Patient is edentulous. IMPRESSION: No acute intracranial process. Mild white matter changes most compatible with chronic small vessel ischemic disease. Electronically Signed   By: Elon Alas M.D.   On: 07/17/2016 05:02   Ct Head Code Stroke W/o Cm  Result Date: 07/17/2016 CLINICAL DATA:  Code stroke. Acute onset weakness prior to arrival, diaphoresis, headache, dizziness. History of hypertension, syncope. EXAM: CT HEAD WITHOUT CONTRAST TECHNIQUE: Contiguous axial images were obtained from the base of the skull through the vertex without intravenous contrast. COMPARISON:  CT HEAD January 23, 2012 FINDINGS: INTRACRANIAL CONTENTS: The ventricles and sulci are normal. No intraparenchymal hemorrhage, mass effect nor midline shift. No acute large vascular territory infarcts. No abnormal extra-axial fluid collections. Basal cisterns are patent. Trace calcific atherosclerosis of the carotid siphons. ORBITS: The included ocular globes and orbital contents are normal. SINUSES: The mastoid aircells and included paranasal sinuses are well-aerated. SKULL/SOFT TISSUES: No skull fracture. No significant soft tissue swelling. Moderate LEFT temporomandibular osteoarthrosis. ASPECTS [facility] Stroke Program Early CT Score, [email or web address]) - Ganglionic level infarction (caudate, lentiform nuclei, internal capsule, insula, M1-M3 cortex): 7 - Supraganglionic infarction (M4-M6 cortex): 3 Total score (0-10 with 10 being normal): 10 IMPRESSION: 1. No acute intracranial process ; negative CT HEAD for age. 2.  ASPECTS score 10. Acute findings discussed with and reconfirmed by Dr.CHRISTOPHER POLLINA on 07/17/2016 at 12:50 am. Electronically Signed   By: Elon Alas M.D.   On: 07/17/2016 00:52        Discharge Exam: Vitals:   07/17/16 1300 07/17/16 1500  BP: (!) 160/80 (!) 169/79  Pulse: (!) 55 (!) 50  Resp: 18 17  Temp: 98.7 F (37.1 C) 97 F (36.1 C)   Vitals:   07/17/16 0943 07/17/16 1100 07/17/16 1300 07/17/16 1500  BP: (!) 172/77 (!) 159/92 (!) 160/80 (!) 169/79  Pulse: (!) 54 (!) 52 (!) 55 (!) 50  Resp: 19 18 18 17   Temp: 98.6 F (37 C) 98.4 F (36.9 C) 98.7 F (37.1 C) 97 F (36.1 C)  TempSrc: Oral Oral Oral Oral  SpO2: 99% 96% 96% 97%  Weight:      Height:        General: Pt is alert, awake, not in acute distress Cardiovascular: RRR, S1/S2 +, no rubs, no gallops Respiratory: CTA bilaterally, no wheezing, no rhonchi Abdominal: Soft, NT, ND, bowel sounds + Extremities: no edema, no cyanosis    The results of significant diagnostics from this hospitalization (including imaging, microbiology, ancillary and laboratory) are listed below for reference.     Microbiology: No results found for this or any previous visit (from the past 240 hour(s)).   Labs: BNP (last 3 results) No results for input(s): BNP in the last 8760 hours. Basic Metabolic Panel:  Recent Labs Lab 07/17/16 0039 07/17/16 0044 07/17/16 0534  NA 135 140 140  K 2.9* 3.0* 3.4*  CL 105 102 105  CO2 26  --  27  GLUCOSE 128* 128* 118*  BUN 11 13 8   CREATININE 0.60 0.60 0.65  CALCIUM 8.7*  --  9.1  MG  --   --  1.9   Liver Function Tests:  Recent Labs Lab 07/17/16 0039  AST 19  ALT 11*  ALKPHOS 102  BILITOT 0.4  PROT 6.5  ALBUMIN 3.5   No results for input(s): LIPASE, AMYLASE in the last 168 hours. No results for input(s): AMMONIA in the last 168 hours. CBC:  Recent Labs Lab 07/17/16 0039 07/17/16 0044 07/17/16 0534  WBC 10.9*  --  8.4  NEUTROABS 6.3  --   --   HGB 13.4  13.9 13.0  HCT 40.9 41.0 39.8  MCV 89.7  --  90.5  PLT 182  --  169   Cardiac Enzymes: No results for input(s): CKTOTAL, CKMB, CKMBINDEX, TROPONINI in the last 168 hours. BNP: Invalid input(s): POCBNP CBG:  Recent Labs Lab 07/17/16 0332 07/17/16 0727 07/17/16 1130 07/17/16 1604  GLUCAP 131* 109* 96 84   D-Dimer No results for input(s): DDIMER in the last 72 hours. Hgb A1c No results for input(s): HGBA1C in the last 72 hours. Lipid Profile  Recent Labs  07/17/16 0534  CHOL 150  HDL 55  LDLCALC 85  TRIG 48  CHOLHDL 2.7   Thyroid function studies No results for input(s): TSH, T4TOTAL, T3FREE, THYROIDAB in the last 72 hours.  Invalid input(s): FREET3 Anemia work up No results for input(s): VITAMINB12, FOLATE, FERRITIN, TIBC, IRON, RETICCTPCT in the last 72 hours. Urinalysis    Component Value Date/Time   COLORURINE YELLOW 07/17/2016 0132   APPEARANCEUR TURBID (A) 07/17/2016 0132   LABSPEC 1.026 07/17/2016 0132   PHURINE 7.5 07/17/2016 0132   GLUCOSEU NEGATIVE 07/17/2016 0132   HGBUR SMALL (A) 07/17/2016 0132   BILIRUBINUR NEGATIVE 07/17/2016 0132   KETONESUR NEGATIVE 07/17/2016 0132   PROTEINUR NEGATIVE 07/17/2016 0132   NITRITE NEGATIVE 07/17/2016 0132   LEUKOCYTESUR NEGATIVE 07/17/2016 0132   Sepsis Labs Invalid input(s): PROCALCITONIN,  WBC,  LACTICIDVEN Microbiology No results found for this or any previous visit (from the past 240 hour(s)).   Time coordinating discharge: Over 30 minutes  SIGNED:   [name], MD  [facility] 07/17/2016, 5:36 PM Pager   If 7PM-7AM, please contact night-coverage [email or web address] Password [email or web address]

## 2016-07-17 NOTE — Evaluation (Signed)
Speech Language Pathology Evaluation Patient Details Name: Rachel [name] MRN: [id number] DOB: 1956/05/16 Today's Date: 07/17/2016 Time: [id number] SLP Time Calculation (min) (ACUTE ONLY): 31 min  Problem List:  Patient Active Problem List   Diagnosis Date Noted  . Vertigo 07/17/2016  . Hypokalemia 07/17/2016  . Midsternal chest pain 06/03/2014  . Chest pain with moderate risk for cardiac etiology 05/08/2014  . Rapid heart beat 05/08/2014  . Current every day smoker   . Essential hypertension    Past Medical History:  Past Medical History:  Diagnosis Date  . Current every day smoker    a. ~35 years - > has cut down to < 3/4 ppd  . History of UTI   . Hypertension   . Midsternal chest pain    a. 05/2014 Stress Echo: Ex time: 8:09, ECG w/o acute changes, EF nl with possible mid and apical anterior HK-->cath recommended.  . Syncope and collapse    Past Surgical History:  Past Surgical History:  Procedure Laterality Date  . BACK SURGERY    . CHOLECYSTECTOMY    . PARTIAL HYSTERECTOMY     HPI:  pt is a 60 yo female adm to [facility] with dizziness, ? right sided weakness. Pt has h/o smoking (current smoker) and has cut down to 3/4 pack per day.  MRI negative.  Pt works, lives alone and is LEFT HANDED.  Speech and swallow evaluation ordered.    Assessment / Plan / Recommendation Clinical Impression  Pt presents with mild cognitive deficits per [facility] scoring of 24/30 (10th grade education).  Speech is clear without dysarthria.  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 On MOCA, Trail task, verbal fluency, abstract thought and delayed recall were areas of challenges.  Pt also noted to have delayed responses to this SLP with flat affect.  Pt did recall 3/5 words independently, 2/5 with category cue.  SlP provided her with memory compensation strategies to maximize function/participation in ADLs.  No SLP follow up indicated as all education completed.       SLP Assessment  Patient does not need any further Speech Lanaguage Pathology Services    Follow Up Recommendations  None    Frequency and Duration           SLP Evaluation Prior Functioning  Cognitive/Linguistic Baseline: Baseline deficits Baseline deficit details: pt states she has memory problems but did not expand on information  Lives With: Alone Education: 10th grade Vocation: Full time employment   Cognition  Overall Cognitive Status: Within Functional Limits for tasks assessed Arousal/Alertness: Awake/alert Orientation Level: Oriented X4 Attention: Sustained Sustained Attention: Impaired Sustained Attention Impairment: Verbal complex Memory: Impaired Memory Impairment: Retrieval deficit (3 of 5 recalled independently, 2/5 with category cue) Awareness: Appears intact Problem Solving: Appears intact Safety/Judgment: Appears intact Comments: abstract thought difficulties, delays in responses    Comprehension  Auditory Comprehension Overall Auditory Comprehension: Appears within functional limits for tasks assessed Yes/No Questions: Not tested Commands: Within Functional Limits Conversation: Complex Interfering Components: Processing speed EffectiveTechniques: Extra processing time;Repetition Visual Recognition/Discrimination Discrimination: Within Function Limits Reading Comprehension Reading Status: Within funtional limits    Expression Expression Primary Mode of Expression: Verbal Verbal Expression Overall Verbal Expression: Appears within functional limits for tasks assessed Initiation: No impairment Repetition: No impairment Naming: Impairment Confrontation: Impaired (2/3 animals named correctly on MOCA) Divergent: Other (comment) (named 4 F words in 60 seconds) Pragmatics: No impairment Written Expression Dominant Hand: Left Written Expression: Within Functional Limits  Oral / Motor  Oral Motor/Sensory Function Overall Oral Motor/Sensory  Function: Mild impairment Facial Sensation: Reduced right (lower and middle facial tract) Motor Speech Overall Motor Speech: Appears within functional limits for tasks assessed Respiration: Within functional limits Phonation: Normal Resonance: Within functional limits Articulation: Within functional limitis Intelligibility: Intelligible Motor Planning: Witnin functional limits Motor Speech Errors: Not applicable   GO          Functional Assessment Tool Used: MOCA Functional Limitations: Memory Memory Current Status [id number]): At least 1 percent but less than 20 percent impaired, limited or restricted Memory Goal Status [id number]): At least 1 percent but less than 20 percent impaired, limited or restricted Memory Discharge Status [id number]): At least 1 percent but less than 20 percent impaired, limited or restricted         Luanna Salk, [facility] SLP [phone]

## 2016-07-17 NOTE — H&P (Signed)
History and Physical  Patient Name: Rachel [name]     [id number]    DOB: [birth year]    DOA: 07/17/2016 PCP: Lynnell Jude, MD   Patient coming from: Home     Chief Complaint: Vertigo  HPI: Rachel [name] is a 60 y.o. female with a past medical history significant for smoking and untreated HTN who presents with acute severe vertigo and possible R sided weakness.  The patient was in her usual state of health until this evening when she went to bed (around 8:30P to the best of her knowledge) with vague malaise.  She then woke around 11:30PM with severe vertigo, had to hold the walls to get to the bathroom, and was found by her sister hollering and vomiting and sweating profusely, standing at the commode holding the wall asking to go to the hospital.  EMS believe she had BP 200/110 and was thought to have unilateral weakness and facial droop and so [facility] was called en route.  ED course: -Heart rate and respirations normal, slightly hypertensive, saturating well on room air -Na 140, K 3.0, Cr 0.6 (baseline), WBC 10.9K, Hgb 13.4 -CT head was unremarkable and CTA of the head and neck showed only mild intracranial atherosclerosis -She was evaluated by Neurology who felt she had an equivocal right sided weakness and recommended MRI brain and observation overnight     Review of Systems:  Pt complains of nausea, vomiting, vertigo, sweating, ataxia, global weakness, constipation and rectal bleeding recently. Pt denies any confusion, syncope, focal weakness, numbness, LOC, slurred speech.  All other systems negative except as just noted or noted in the history of present illness.  Past Medical History:  Diagnosis Date  . Current every day smoker    a. ~35 years - > has cut down to < 3/4 ppd  . History of UTI   . Hypertension   . Midsternal chest pain    a. 05/2014 Stress Echo: Ex time: 8:09, ECG w/o acute changes, EF nl with possible mid and apical anterior HK-->cath recommended.    . Syncope and collapse     Past Surgical History:  Procedure Laterality Date  . BACK SURGERY    . CHOLECYSTECTOMY    . PARTIAL HYSTERECTOMY      Social History: Patient lives with her sister.  Patient walks unassisted.  She works in a [employer], product/process development.  She smokes daily.    No Known Allergies  Family history: family history includes Breast cancer in her mother; Heart disease in her paternal grandmother; Hyperlipidemia in her father; Stroke in her maternal grandfather.  Prior to Admission medications   Medication Sig Start Date End Date Taking? Authorizing Provider  ibuprofen (ADVIL,MOTRIN) 800 MG tablet Take 800 mg by mouth 4 (four) times daily.  06/02/14   Historical Provider, MD  isosorbide mononitrate (IMDUR) 30 MG 24 hr tablet Take 1 tablet (30 mg total) by mouth daily. 06/03/14   Rogelia Mire, NP  metoprolol tartrate (LOPRESSOR) 25 MG tablet Take 0.5 tablets (12.5 mg total) by mouth 2 (two) times daily. 06/03/14   Rogelia Mire, NP  nitroGLYCERIN (NITROSTAT) 0.4 MG SL tablet Place 1 tablet (0.4 mg total) under the tongue every 5 (five) minutes as needed for chest pain. 06/03/14   Rogelia Mire, NP  oxyCODONE-acetaminophen (PERCOCET) 7.5-325 MG per tablet Take 1 tablet by mouth as needed.  06/02/14   Historical Provider, MD     Physical Exam: BP 189/98   Pulse 61  Resp 16   SpO2 100%  General appearance: Well-developed, adult female, eyes closed, appears sluggish and tired but is responsive and oriented. Eyes: Anicteric, conjunctiva pink, lids and lashes normal.     ENT: No nasal deformity, discharge, or epistaxis.  OP moist without lesions.   Skin: Warm and dry.  No jaundice.  No suspicious rashes or lesions. Cardiac: RRR, nl S1-S2, no murmurs appreciated.  Capillary refill is brisk.  JVP normal.  No LE edema.  Radial and DP pulses 2+ and symmetric.  No carotid bruits. Respiratory: Normal respiratory rate and rhythm.  CTAB without rales or  wheezes. GI: Abdomen soft without rigidity.  No TTP. No ascites, distension.   MSK: No deformities or effusions. Neuro: Pupils are equal and reactive. Extraocular movements are intact, without any rapid nystagmus, her eyes seem to wander a little. Cranial nerve 5 is within normal limits. Cranial nerve 7 is symmetrical. Cranial nerve 8 is within normal limits. Cranial nerves 9 and 10 reveal equal palate elevation. Cranial nerve 11 reveals sternocleidomastoid strong. Cranial nerve 12 is midline. She is globally 4/5 strength to resistance but will not lift her arms for FTN testing or Romberg, and is too weak to sit up.  Speech is fluent. Naming is grossly intact. Recall, recent and remote, as well as general fund of knowledge seem within normal limits. Psych: Unable to assess.     Labs on Admission:  I have personally reviewed following labs and imaging studies: CBC:  Recent Labs Lab 07/17/16 0039 07/17/16 0044  WBC 10.9*  --   NEUTROABS 6.3  --   HGB 13.4 13.9  HCT 40.9 41.0  MCV 89.7  --   PLT 182  --    Basic Metabolic Panel:  Recent Labs Lab 07/17/16 0039 07/17/16 0044  NA 135 140  K 2.9* 3.0*  CL 105 102  CO2 26  --   GLUCOSE 128* 128*  BUN 11 13  CREATININE 0.60 0.60  CALCIUM 8.7*  --    GFR: CrCl cannot be calculated (Unknown ideal weight.). Liver Function Tests:  Recent Labs Lab 07/17/16 0039  AST 19  ALT 11*  ALKPHOS 102  BILITOT 0.4  PROT 6.5  ALBUMIN 3.5   Coagulation Profile:  Recent Labs Lab 07/17/16 0039  INR 0.94     Radiological Exams on Admission: The following reports were personally reviewed. Ct Angio Head W Or Wo Contrast  Result Date: 07/17/2016 CLINICAL DATA:  Prior code stroke. Acute onset weakness, diaphoresis, headache and dizziness. History of hypertension and syncope. EXAM: CT ANGIOGRAPHY HEAD AND NECK TECHNIQUE: Multidetector CT imaging of the head and neck was performed using the standard protocol during bolus  administration of intravenous contrast. Multiplanar CT image reconstructions and MIPs were obtained to evaluate the vascular anatomy. Carotid stenosis measurements (when applicable) are obtained utilizing NASCET criteria, using the distal internal carotid diameter as the denominator. CONTRAST:  50 cc Isovue 370 COMPARISON:  CT HEAD July 17, 2016 at 0049 hours FINDINGS: CTA NECK AORTIC ARCH: Normal appearance of the thoracic arch, Common origin innominate and LEFT Common carotid artery. Mild calcific atherosclerosis of the aortic arch and LEFT subclavian artery origin. The origins of the innominate, left Common carotid artery and subclavian artery are widely patent. RIGHT CAROTID SYSTEM: Common carotid artery is widely patent, coursing in a straight line fashion. Normal appearance of the carotid bifurcation without hemodynamically significant stenosis by NASCET criteria. Normal appearance of the included internal carotid artery. LEFT CAROTID SYSTEM: Common carotid artery  is widely patent, coursing in a straight line fashion. Normal appearance of the carotid bifurcation without hemodynamically significant stenosis by NASCET criteria. Mild eccentric calcific atherosclerosis of LEFT internal carotid artery origin. Normal appearance of the included internal carotid artery. VERTEBRAL ARTERIES:Venous contamination limits assessment of proximal vertebral arteries. Left vertebral artery is dominant. Normal appearance of the vertebral arteries, which appear widely patent. Multilevel moderate degenerative disc severe RIGHT C4-5, severe LEFT C6-7 neural foraminal narrowing moderate to severe bilateral C7-T1 neural foraminal narrowing. Patient is edentulous. OTHER NECK: Soft tissues of the neck are non-acute though, not tailored for evaluation. Mild centrilobular emphysema and apical pleural scarring. 26 x 19 mm solid RIGHT thyroid nodule. CTA HEAD ANTERIOR CIRCULATION: Normal appearance of the cervical internal carotid  arteries, petrous, cavernous and supra clinoid internal carotid arteries. Widely patent anterior communicating artery. Normal appearance of the anterior and middle cerebral arteries. Mild luminal regularity of the mid to distal anterior cerebral arteries and middle cerebral arteries. No large vessel occlusion, hemodynamically significant stenosis, dissection, contrast extravasation or aneurysm. POSTERIOR CIRCULATION: Normal appearance of the vertebral arteries, vertebrobasilar junction and basilar artery, as well as main branch vessels. Bilateral posterior cerebral arteries are patent. Mild stenosis LEFT P2 segment. No large vessel occlusion, hemodynamically significant stenosis, dissection, luminal irregularity, contrast extravasation or aneurysm. VENOUS SINUSES: Major dural venous sinuses are patent though not tailored for evaluation on this angiographic examination. ANATOMIC VARIANTS: None. DELAYED PHASE: Not performed. IMPRESSION: CTA NECK: Atherosclerosis without hemodynamically significant stenosis or acute vascular process. 2.6 cm RIGHT thyroid nodule for which follow up thyroid sonogram is recommended on a nonemergent basis. CTA HEAD:  No emergent large vessel occlusion or severe stenosis. Mild luminal regularity/ stenosis of the intracranial vessels compatible with atherosclerosis. Electronically Signed   By: Elon Alas M.D.   On: 07/17/2016 02:02   Ct Angio Neck W Or Wo Contrast  Result Date: 07/17/2016 CLINICAL DATA:  Prior code stroke. Acute onset weakness, diaphoresis, headache and dizziness. History of hypertension and syncope. EXAM: CT ANGIOGRAPHY HEAD AND NECK TECHNIQUE: Multidetector CT imaging of the head and neck was performed using the standard protocol during bolus administration of intravenous contrast. Multiplanar CT image reconstructions and MIPs were obtained to evaluate the vascular anatomy. Carotid stenosis measurements (when applicable) are obtained utilizing NASCET criteria,  using the distal internal carotid diameter as the denominator. CONTRAST:  50 cc Isovue 370 COMPARISON:  CT HEAD July 17, 2016 at 0049 hours FINDINGS: CTA NECK AORTIC ARCH: Normal appearance of the thoracic arch, Common origin innominate and LEFT Common carotid artery. Mild calcific atherosclerosis of the aortic arch and LEFT subclavian artery origin. The origins of the innominate, left Common carotid artery and subclavian artery are widely patent. RIGHT CAROTID SYSTEM: Common carotid artery is widely patent, coursing in a straight line fashion. Normal appearance of the carotid bifurcation without hemodynamically significant stenosis by NASCET criteria. Normal appearance of the included internal carotid artery. LEFT CAROTID SYSTEM: Common carotid artery is widely patent, coursing in a straight line fashion. Normal appearance of the carotid bifurcation without hemodynamically significant stenosis by NASCET criteria. Mild eccentric calcific atherosclerosis of LEFT internal carotid artery origin. Normal appearance of the included internal carotid artery. VERTEBRAL ARTERIES:Venous contamination limits assessment of proximal vertebral arteries. Left vertebral artery is dominant. Normal appearance of the vertebral arteries, which appear widely patent. Multilevel moderate degenerative disc severe RIGHT C4-5, severe LEFT C6-7 neural foraminal narrowing moderate to severe bilateral C7-T1 neural foraminal narrowing. Patient is edentulous. OTHER NECK: Soft tissues of  the neck are non-acute though, not tailored for evaluation. Mild centrilobular emphysema and apical pleural scarring. 26 x 19 mm solid RIGHT thyroid nodule. CTA HEAD ANTERIOR CIRCULATION: Normal appearance of the cervical internal carotid arteries, petrous, cavernous and supra clinoid internal carotid arteries. Widely patent anterior communicating artery. Normal appearance of the anterior and middle cerebral arteries. Mild luminal regularity of the mid to distal  anterior cerebral arteries and middle cerebral arteries. No large vessel occlusion, hemodynamically significant stenosis, dissection, contrast extravasation or aneurysm. POSTERIOR CIRCULATION: Normal appearance of the vertebral arteries, vertebrobasilar junction and basilar artery, as well as main branch vessels. Bilateral posterior cerebral arteries are patent. Mild stenosis LEFT P2 segment. No large vessel occlusion, hemodynamically significant stenosis, dissection, luminal irregularity, contrast extravasation or aneurysm. VENOUS SINUSES: Major dural venous sinuses are patent though not tailored for evaluation on this angiographic examination. ANATOMIC VARIANTS: None. DELAYED PHASE: Not performed. IMPRESSION: CTA NECK: Atherosclerosis without hemodynamically significant stenosis or acute vascular process. 2.6 cm RIGHT thyroid nodule for which follow up thyroid sonogram is recommended on a nonemergent basis. CTA HEAD:  No emergent large vessel occlusion or severe stenosis. Mild luminal regularity/ stenosis of the intracranial vessels compatible with atherosclerosis. Electronically Signed   By: Elon Alas M.D.   On: 07/17/2016 02:02   Ct Head Code Stroke W/o Cm  Result Date: 07/17/2016 CLINICAL DATA:  Code stroke. Acute onset weakness prior to arrival, diaphoresis, headache, dizziness. History of hypertension, syncope. EXAM: CT HEAD WITHOUT CONTRAST TECHNIQUE: Contiguous axial images were obtained from the base of the skull through the vertex without intravenous contrast. COMPARISON:  CT HEAD January 23, 2012 FINDINGS: INTRACRANIAL CONTENTS: The ventricles and sulci are normal. No intraparenchymal hemorrhage, mass effect nor midline shift. No acute large vascular territory infarcts. No abnormal extra-axial fluid collections. Basal cisterns are patent. Trace calcific atherosclerosis of the carotid siphons. ORBITS: The included ocular globes and orbital contents are normal. SINUSES: The mastoid aircells and  included paranasal sinuses are well-aerated. SKULL/SOFT TISSUES: No skull fracture. No significant soft tissue swelling. Moderate LEFT temporomandibular osteoarthrosis. ASPECTS [facility] Stroke Program Early CT Score, [email or web address]) - Ganglionic level infarction (caudate, lentiform nuclei, internal capsule, insula, M1-M3 cortex): 7 - Supraganglionic infarction (M4-M6 cortex): 3 Total score (0-10 with 10 being normal): 10 IMPRESSION: 1. No acute intracranial process ; negative CT HEAD for age. 2. ASPECTS score 10. Acute findings discussed with and reconfirmed by Dr.Eryc Bodey POLLINA on 07/17/2016 at 12:50 am. Electronically Signed   By: Elon Alas M.D.   On: 07/17/2016 00:52     EKG: Independently reviewed. Rate 58, sinus rhythm.  U waves, QTc normal.  No ST segment changes.    Assessment/Plan 1. Acute Vertigo and transient unilateral weakness:  This is new.  MRI pending.  Posterior stroke suspected by Neurology at this time vs TIA versus peripheral vertigo.  CTA with evidence of mild atherosclerotic disease in intracranial vessels, no high grade carotid stenosis. -Admit to telemetry -Neuro checks, NIHSS per protocol -Daily aspirin 325 mg -Permissive hypertension for now -Lipids, hemoglobin A1c -MRI brain ordered -Echocardiogram ordered -PT/OT/SLP consultation -Consult to Neurology, appreciate recommendations    2. Hypokalemia:  This is new.   -IVF with K -Repeat BMP -Check Mag  3. HTN:  -Permissive hypertension for now up to 220/110 mmHg, hold Imdur and metoprolol      DVT prophylaxis: Lovenox  Code Status: FULL  Family Communication: Daughters at bedside  Disposition Plan: Anticipate Stroke work up as above and consult to ancillary services.  Possible  discharge as early as tomorrow pending MRI. Consults called: Neurology, Dr. Leonel Ramsay has seen patient. Admission status: Telemetry, OBS status  Core measures: -VTE prophylaxis ordered at time of  admission -Aspirin ordered at admission -Atrial fibrillation: not previously present -tPA not given because the patient was outside the tPA window -Dysphagia screen ordered in ER -Lipids ordered -PT eval ordered -Smoking cessation recommended    Medical decision making: Patient seen at 2:29 AM on 07/17/2016.  The patient was discussed with Dr. Betsey Holiday. What exists of the patient's chart was reviewed in depth.  Clinical condition: stable.       Edwin Dada [facility] Pager [phone]

## 2016-07-17 NOTE — ED Notes (Signed)
Pt in bed w/ family at the bedside

## 2016-07-17 NOTE — Progress Notes (Signed)
  Echocardiogram 2D Echocardiogram has been performed.  Rachel [name] 07/17/2016, 2:16 PM

## 2016-07-18 LAB — HEMOGLOBIN A1C
HEMOGLOBIN A1C: 5.5 % (ref 4.8–5.6)
MEAN PLASMA GLUCOSE: 111 mg/dL

## 2016-07-18 NOTE — Progress Notes (Signed)
07/18/2016 at 1130 am Pt discharged late yesterday with orders for [facility] services. Pt does not have insurance information in the system. CM called and spoke with Ms Bry and she stated she has [employer]. CM asked about [facility] services and she stated she did not need any HH since the MD informed her she could go back to work tomorrow. No further needs per CM.

## 2016-08-01 ENCOUNTER — Other Ambulatory Visit: Payer: Self-pay | Admitting: Gastroenterology

## 2016-08-01 DIAGNOSIS — K6289 Other specified diseases of anus and rectum: Secondary | ICD-10-CM

## 2016-08-01 DIAGNOSIS — I639 Cerebral infarction, unspecified: Secondary | ICD-10-CM | POA: Insufficient documentation

## 2016-08-01 DIAGNOSIS — K625 Hemorrhage of anus and rectum: Secondary | ICD-10-CM

## 2016-08-09 ENCOUNTER — Ambulatory Visit
Admission: RE | Admit: 2016-08-09 | Discharge: 2016-08-09 | Disposition: A | Discharge status: Home / Self Care | Payer: BLUE CROSS/BLUE SHIELD | Source: Ambulatory Visit | Attending: Gastroenterology | Admitting: Gastroenterology

## 2016-08-09 DIAGNOSIS — R918 Other nonspecific abnormal finding of lung field: Secondary | ICD-10-CM | POA: Insufficient documentation

## 2016-08-09 DIAGNOSIS — K629 Disease of anus and rectum, unspecified: Secondary | ICD-10-CM | POA: Diagnosis not present

## 2016-08-09 DIAGNOSIS — R195 Other fecal abnormalities: Secondary | ICD-10-CM | POA: Diagnosis not present

## 2016-08-09 DIAGNOSIS — K6289 Other specified diseases of anus and rectum: Secondary | ICD-10-CM

## 2016-08-09 DIAGNOSIS — K625 Hemorrhage of anus and rectum: Secondary | ICD-10-CM | POA: Insufficient documentation

## 2016-08-09 DIAGNOSIS — C20 Malignant neoplasm of rectum: Secondary | ICD-10-CM | POA: Insufficient documentation

## 2016-08-09 MED ORDER — IOPAMIDOL (ISOVUE-300) INJECTION 61%
100.0000 mL | Freq: Once | INTRAVENOUS | Status: AC | PRN
  Administered 2016-08-09: 100 mL via INTRAVENOUS

## 2016-08-11 ENCOUNTER — Encounter: Payer: Self-pay | Admitting: *Deleted

## 2016-08-15 ENCOUNTER — Encounter
Admission: RE | Disposition: A | Discharge status: Home / Self Care | Payer: Self-pay | Source: Ambulatory Visit | Attending: Internal Medicine

## 2016-08-15 ENCOUNTER — Ambulatory Visit: Payer: BLUE CROSS/BLUE SHIELD | Admitting: Anesthesiology

## 2016-08-15 ENCOUNTER — Encounter: Payer: Self-pay | Admitting: *Deleted

## 2016-08-15 ENCOUNTER — Observation Stay
Admission: RE | Admit: 2016-08-15 | Discharge: 2016-08-16 | Disposition: A | Discharge status: Home / Self Care | Payer: BLUE CROSS/BLUE SHIELD | Source: Ambulatory Visit | Attending: Internal Medicine | Admitting: Internal Medicine

## 2016-08-15 DIAGNOSIS — I10 Essential (primary) hypertension: Secondary | ICD-10-CM | POA: Insufficient documentation

## 2016-08-15 DIAGNOSIS — C20 Malignant neoplasm of rectum: Secondary | ICD-10-CM | POA: Diagnosis not present

## 2016-08-15 DIAGNOSIS — F17210 Nicotine dependence, cigarettes, uncomplicated: Secondary | ICD-10-CM | POA: Diagnosis not present

## 2016-08-15 DIAGNOSIS — Z823 Family history of stroke: Secondary | ICD-10-CM | POA: Insufficient documentation

## 2016-08-15 DIAGNOSIS — I6522 Occlusion and stenosis of left carotid artery: Secondary | ICD-10-CM | POA: Insufficient documentation

## 2016-08-15 DIAGNOSIS — R198 Other specified symptoms and signs involving the digestive system and abdomen: Secondary | ICD-10-CM | POA: Diagnosis not present

## 2016-08-15 DIAGNOSIS — Z87891 Personal history of nicotine dependence: Secondary | ICD-10-CM | POA: Diagnosis present

## 2016-08-15 DIAGNOSIS — I700 Atherosclerosis of aorta: Secondary | ICD-10-CM | POA: Diagnosis not present

## 2016-08-15 DIAGNOSIS — Z79899 Other long term (current) drug therapy: Secondary | ICD-10-CM | POA: Insufficient documentation

## 2016-08-15 DIAGNOSIS — M50323 Other cervical disc degeneration at C6-C7 level: Secondary | ICD-10-CM | POA: Diagnosis not present

## 2016-08-15 DIAGNOSIS — J479 Bronchiectasis, uncomplicated: Secondary | ICD-10-CM | POA: Diagnosis not present

## 2016-08-15 DIAGNOSIS — Z803 Family history of malignant neoplasm of breast: Secondary | ICD-10-CM | POA: Diagnosis not present

## 2016-08-15 DIAGNOSIS — Z8249 Family history of ischemic heart disease and other diseases of the circulatory system: Secondary | ICD-10-CM | POA: Insufficient documentation

## 2016-08-15 DIAGNOSIS — Z888 Allergy status to other drugs, medicaments and biological substances status: Secondary | ICD-10-CM | POA: Insufficient documentation

## 2016-08-15 DIAGNOSIS — F17200 Nicotine dependence, unspecified, uncomplicated: Secondary | ICD-10-CM | POA: Diagnosis not present

## 2016-08-15 DIAGNOSIS — Z8673 Personal history of transient ischemic attack (TIA), and cerebral infarction without residual deficits: Secondary | ICD-10-CM | POA: Insufficient documentation

## 2016-08-15 DIAGNOSIS — J432 Centrilobular emphysema: Secondary | ICD-10-CM | POA: Insufficient documentation

## 2016-08-15 DIAGNOSIS — Z8349 Family history of other endocrine, nutritional and metabolic diseases: Secondary | ICD-10-CM | POA: Diagnosis not present

## 2016-08-15 DIAGNOSIS — J984 Other disorders of lung: Secondary | ICD-10-CM | POA: Insufficient documentation

## 2016-08-15 DIAGNOSIS — K5900 Constipation, unspecified: Secondary | ICD-10-CM | POA: Insufficient documentation

## 2016-08-15 DIAGNOSIS — E041 Nontoxic single thyroid nodule: Secondary | ICD-10-CM | POA: Insufficient documentation

## 2016-08-15 DIAGNOSIS — I2510 Atherosclerotic heart disease of native coronary artery without angina pectoris: Secondary | ICD-10-CM | POA: Diagnosis not present

## 2016-08-15 DIAGNOSIS — Z9049 Acquired absence of other specified parts of digestive tract: Secondary | ICD-10-CM | POA: Diagnosis not present

## 2016-08-15 DIAGNOSIS — Z90710 Acquired absence of both cervix and uterus: Secondary | ICD-10-CM | POA: Diagnosis not present

## 2016-08-15 DIAGNOSIS — R079 Chest pain, unspecified: Secondary | ICD-10-CM | POA: Diagnosis present

## 2016-08-15 DIAGNOSIS — R194 Change in bowel habit: Secondary | ICD-10-CM | POA: Diagnosis present

## 2016-08-15 DIAGNOSIS — K6289 Other specified diseases of anus and rectum: Secondary | ICD-10-CM | POA: Diagnosis present

## 2016-08-15 DIAGNOSIS — Z87440 Personal history of urinary (tract) infections: Secondary | ICD-10-CM | POA: Diagnosis not present

## 2016-08-15 DIAGNOSIS — Z7902 Long term (current) use of antithrombotics/antiplatelets: Secondary | ICD-10-CM | POA: Diagnosis not present

## 2016-08-15 DIAGNOSIS — K625 Hemorrhage of anus and rectum: Secondary | ICD-10-CM | POA: Diagnosis present

## 2016-08-15 HISTORY — DX: Cerebral infarction, unspecified: I63.9

## 2016-08-15 HISTORY — DX: Headache, unspecified: R51.9

## 2016-08-15 HISTORY — PX: COLONOSCOPY WITH PROPOFOL: SHX5780

## 2016-08-15 HISTORY — DX: Body mass index (BMI) 24.0-24.9, adult: Z68.24

## 2016-08-15 HISTORY — DX: Atherosclerotic heart disease of native coronary artery without angina pectoris: I25.10

## 2016-08-15 HISTORY — DX: Headache: R51

## 2016-08-15 SURGERY — COLONOSCOPY WITH PROPOFOL
Anesthesia: General

## 2016-08-15 MED ORDER — PANTOPRAZOLE SODIUM 40 MG PO TBEC
40.0000 mg | DELAYED_RELEASE_TABLET | Freq: Every day | ORAL | Status: DC
  Administered 2016-08-15 – 2016-08-16 (×2): 40 mg via ORAL
  Filled 2016-08-15 (×2): qty 1

## 2016-08-15 MED ORDER — PROPOFOL 500 MG/50ML IV EMUL
INTRAVENOUS | Status: DC | PRN
  Administered 2016-08-15: 100 ug/kg/min via INTRAVENOUS

## 2016-08-15 MED ORDER — METOPROLOL TARTRATE 25 MG PO TABS
12.5000 mg | ORAL_TABLET | Freq: Two times a day (BID) | ORAL | Status: DC
  Administered 2016-08-15: 12.5 mg via ORAL
  Filled 2016-08-15: qty 1

## 2016-08-15 MED ORDER — MIDAZOLAM HCL 2 MG/2ML IJ SOLN
INTRAMUSCULAR | Status: DC | PRN
  Administered 2016-08-15: 1 mg via INTRAVENOUS

## 2016-08-15 MED ORDER — FENTANYL CITRATE (PF) 100 MCG/2ML IJ SOLN
INTRAMUSCULAR | Status: DC | PRN
  Administered 2016-08-15: 50 ug via INTRAVENOUS

## 2016-08-15 MED ORDER — TRAMADOL HCL 50 MG PO TABS
50.0000 mg | ORAL_TABLET | Freq: Four times a day (QID) | ORAL | Status: DC | PRN
  Administered 2016-08-15 – 2016-08-16 (×3): 50 mg via ORAL
  Filled 2016-08-15 (×3): qty 1

## 2016-08-15 MED ORDER — AMLODIPINE BESYLATE 10 MG PO TABS
10.0000 mg | ORAL_TABLET | Freq: Every evening | ORAL | Status: DC
  Administered 2016-08-15: 23:00:00 10 mg via ORAL
  Filled 2016-08-15: qty 1

## 2016-08-15 MED ORDER — NITROGLYCERIN 0.4 MG SL SUBL: 0.4000 mg | SUBLINGUAL_TABLET | SUBLINGUAL | Status: DC | PRN

## 2016-08-15 MED ORDER — ISOSORBIDE MONONITRATE ER 30 MG PO TB24
30.0000 mg | ORAL_TABLET | Freq: Every day | ORAL | Status: DC
  Administered 2016-08-15 – 2016-08-16 (×2): 30 mg via ORAL
  Filled 2016-08-15 (×2): qty 1

## 2016-08-15 MED ORDER — LISINOPRIL 10 MG PO TABS
10.0000 mg | ORAL_TABLET | Freq: Every day | ORAL | Status: DC
  Administered 2016-08-15 – 2016-08-16 (×2): 10 mg via ORAL
  Filled 2016-08-15 (×2): qty 1

## 2016-08-15 MED ORDER — OXYCODONE HCL 5 MG PO TABS
5.0000 mg | ORAL_TABLET | ORAL | Status: DC | PRN
  Administered 2016-08-16: 13:00:00 5 mg via ORAL
  Filled 2016-08-15: qty 1

## 2016-08-15 MED ORDER — HYDRALAZINE HCL 20 MG/ML IJ SOLN: 5.0000 mg | Freq: Four times a day (QID) | INTRAMUSCULAR | Status: DC | PRN

## 2016-08-15 MED ORDER — NICOTINE 14 MG/24HR TD PT24
14.0000 mg | MEDICATED_PATCH | Freq: Every day | TRANSDERMAL | Status: DC
  Administered 2016-08-15 – 2016-08-16 (×2): 14 mg via TRANSDERMAL
  Filled 2016-08-15 (×2): qty 1

## 2016-08-15 MED ORDER — SODIUM CHLORIDE 0.9 % IV SOLN: INTRAVENOUS | Status: DC

## 2016-08-15 MED ORDER — SODIUM CHLORIDE 0.9 % IV SOLN
INTRAVENOUS | Status: DC
  Administered 2016-08-15: 10:00:00 via INTRAVENOUS

## 2016-08-15 NOTE — Anesthesia Procedure Notes (Signed)
Performed by: COOK-MARTIN, Satchel Heidinger Pre-anesthesia Checklist: Patient identified, Emergency Drugs available, Suction available, Patient being monitored and Timeout performed Patient Re-evaluated:Patient Re-evaluated prior to inductionOxygen Delivery Method: Nasal cannula Preoxygenation: Pre-oxygenation with 100% oxygen Intubation Type: IV induction Placement Confirmation: positive ETCO2 and CO2 detector       

## 2016-08-15 NOTE — Progress Notes (Signed)
Patient ID: Rachel [name], female   DOB: 26-Oct-1956, 60 y.o.   MRN: [id number] Pt seen. 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 CT suggests advanced local disease-no mets. CEA ordered. Have asked oncology to consult. She lkely will need chemo/radiation to begin with surgery after. Full note to follow

## 2016-08-15 NOTE — Transfer of Care (Signed)
Immediate Anesthesia Transfer of Care Note  Patient: Rachel [name]  Procedure(s) Performed: Procedure(s): COLONOSCOPY WITH PROPOFOL (N/A)  Patient Location: PACU  Anesthesia Type:General  Level of Consciousness: awake and sedated  Airway & Oxygen Therapy: Patient Spontanous Breathing and Patient connected to nasal cannula oxygen  Post-op Assessment: Report given to RN and Post -op Vital signs reviewed and stable  Post vital signs: Reviewed and stable  Last Vitals:  Vitals:   08/15/16 1013  BP: (!) 177/94  Pulse: 65  Resp: 18  Temp: (!) 35.9 C    Last Pain:  Vitals:   08/15/16 1013  TempSrc: Tympanic         Complications: No apparent anesthesia complications

## 2016-08-15 NOTE — H&P (Signed)
[facility] NAME: Rachel [name]    MR#:  [id number]  DATE OF BIRTH:  [birth year]  DATE OF ADMISSION:  08/15/2016  PRIMARY CARE PHYSICIAN: BLISS, Lynnell Jude, MD   REQUESTING/REFERRING PHYSICIAN: skulskie  CHIEF COMPLAINT:  Outpatient colonoscopy for constipation  HISTORY OF PRESENT ILLNESS:  Rachel [name]  is a 60 y.o. female came in to the endoscopy unit today for outpatient colonoscopy as she was experiencing constipation and rectal bleeding for the past few days. Digital rectal examination has revealed a mass and CT scan concured the same. Patient had colonoscopy and Dr. Anastasio Champion has requested hospitalist team to admit the patient for obstructing rectal mass. Patient was seen and examined in the recovery unit following colonoscopy. Resting comfortably. Denies any abdominal pain or nausea. Denies any chest pain or shortness of breath. 2 daughters at bedside.    PAST MEDICAL HISTORY:   Past Medical History:  Diagnosis Date  . Body mass index (BMI) of 24.0-24.9 in adult   . Cardiovascular disease   . Current every day smoker    a. ~35 years - > has cut down to < 3/4 ppd  . Headache   . History of UTI   . Hypertension   . Midsternal chest pain    a. 05/2014 Stress Echo: Ex time: 8:09, ECG w/o acute changes, EF nl with possible mid and apical anterior HK-->cath recommended.  . Stroke ([place])   . Syncope and collapse     PAST SURGICAL HISTOIRY:   Past Surgical History:  Procedure Laterality Date  . ABDOMINAL HYSTERECTOMY    . BACK SURGERY    . CHOLECYSTECTOMY    . ERCP    . PARTIAL HYSTERECTOMY      SOCIAL HISTORY:   Social History  Substance Use Topics  . Smoking status: Current Every Day Smoker    Packs/day: 0.25    Years: 35.00    Types: Cigarettes  . Smokeless tobacco: Never Used  . Alcohol use No     Comment: occasional    FAMILY HISTORY:   Family History  Problem Relation Age of Onset  . Breast cancer  Mother   . Hyperlipidemia Father   . Heart disease Paternal Grandmother   . Stroke Maternal Grandfather     DRUG ALLERGIES:   Allergies  Allergen Reactions  . Prednisone Other (See Comments)    BLISTERS IN MOUTH AND ON TONGUE    REVIEW OF SYSTEMS:  CONSTITUTIONAL: No fever, fatigue or weakness.  EYES: No blurred or double vision.  EARS, NOSE, AND THROAT: No tinnitus or ear pain.  RESPIRATORY: No cough, shortness of breath, wheezing or hemoptysis.  CARDIOVASCULAR: No chest pain, orthopnea, edema.  GASTROINTESTINAL: No nausea, vomiting, diarrhea or abdominal pain. Reporting constipation and some rectal bleed GENITOURINARY: No dysuria, hematuria.  ENDOCRINE: No polyuria, nocturia,  HEMATOLOGY: No anemia, easy bruising or bleeding SKIN: No rash or lesion. MUSCULOSKELETAL: No joint pain or arthritis.   NEUROLOGIC: No tingling, numbness, weakness.  PSYCHIATRY: No anxiety or depression.   MEDICATIONS AT HOME:   Prior to Admission medications   Medication Sig Start Date End Date Taking? Authorizing Provider  isosorbide mononitrate (IMDUR) 30 MG 24 hr tablet Take 1 tablet (30 mg total) by mouth daily. 06/03/14  Yes Rogelia Mire, NP  lisinopril (PRINIVIL,ZESTRIL) 10 MG tablet Take 10 mg by mouth daily.   Yes Historical Provider, MD  metoprolol tartrate (LOPRESSOR) 25 MG tablet Take 0.5 tablets (12.5 mg  total) by mouth 2 (two) times daily. 06/03/14  Yes Rogelia Mire, NP  shark liver oil-cocoa butter (PREPARATION H) 0.25-3-85.5 % suppository Place 1 suppository rectally daily as needed for hemorrhoids.   Yes Historical Provider, MD  traMADol (ULTRAM) 50 MG tablet Take 50 mg by mouth every 6 (six) hours as needed for moderate pain.   Yes Historical Provider, MD  clopidogrel (PLAVIX) 75 MG tablet Take 1 tablet (75 mg total) by mouth daily. 07/18/16   [name], MD  nitroGLYCERIN (NITROSTAT) 0.4 MG SL tablet Place 1 tablet (0.4 mg total) under the tongue every 5 (five) minutes as  needed for chest pain. 06/03/14   Rogelia Mire, NP  omeprazole (PRILOSEC) 20 MG capsule Take 20 mg by mouth daily.    Historical Provider, MD      VITAL SIGNS:  Blood pressure (!) 167/90, pulse (!) 50, temperature 97.8 F (36.6 C), temperature source Tympanic, resp. rate 18, height 5\' 6"  (1.676 m), weight 64.9 kg (143 lb), SpO2 100 %.  PHYSICAL EXAMINATION:  GENERAL:  60 y.o.-year-old patient lying in the bed with no acute distress.  EYES: Pupils equal, round, reactive to light and accommodation. No scleral icterus. Extraocular muscles intact.  HEENT: Head atraumatic, normocephalic. Oropharynx and nasopharynx clear.  NECK:  Supple, no jugular venous distention. No thyroid enlargement, no tenderness.  LUNGS: Normal breath sounds bilaterally, no wheezing, rales,rhonchi or crepitation. No use of accessory muscles of respiration.  CARDIOVASCULAR: S1, S2 normal. No murmurs, rubs, or gallops.  ABDOMEN: Soft, nontender, nondistended. Bowel sounds present. No organomegaly or mass.  EXTREMITIES: No pedal edema, cyanosis, or clubbing.  NEUROLOGIC: Cranial nerves II through XII are intact. Muscle strength 5/5 in all extremities. Sensation intact. Gait not checked.  PSYCHIATRIC: The patient is alert and oriented x 3.  SKIN: No obvious rash, lesion, or ulcer.   LABORATORY PANEL:   CBC No results for input(s): WBC, HGB, HCT, PLT in the last 168 hours. ------------------------------------------------------------------------------------------------------------------  Chemistries  No results for input(s): NA, K, CL, CO2, GLUCOSE, BUN, CREATININE, CALCIUM, MG, AST, ALT, ALKPHOS, BILITOT in the last 168 hours.  Invalid input(s): GFRCGP ------------------------------------------------------------------------------------------------------------------  Cardiac Enzymes No results for input(s): TROPONINI in the last 168  hours. ------------------------------------------------------------------------------------------------------------------  RADIOLOGY:  No results found.  EKG:   Orders placed or performed during the hospital encounter of 07/17/16  . EKG 12-Lead  . EKG 12-Lead  . EKG    IMPRESSION AND PLAN:   60 yr old female Came in to the endoscopy unit today for outpatient colonoscopy as she was experiencing constipation and rectal bleeding for the past few days. Digital rectal examination has revealed a mass and CT scan concured the same. Patient had colonoscopy and Dr. Anastasio Champion has requested hospitalist team to admit the patient for obstructing rectal mass.  #Obstructing rectal mass Status post colonoscopy today by Dr. Jaquita Rector, admitted to [place] unit for possible surgery  surgery consult is placed to dr.Shankar Holding Plavix for now for possible surgery Check a.m. labs including CBC  #History of cardiac vascular disease With unknown  degree of heart block Currently patient is asymptomatic   consult cardiology Dr. Fletcher Anon for preop clearance   patient stopped taking aspirin but has been taking Plavix which was held for colonoscopy Will continue her home medication Imdur, beta blocker, ACE inhibitor   #Essential hypertension Continue home medications Zestril, Lopressor and Imdur  #Recent history of stroke Currently Plavix is on hold as patient had colonoscopy today and anticipating surgery of the  rectal mass soon   #tobacco abuse disorder Counseled patient to  quit smoking for 3-5 minutes Will provide nicotine patch   GI prophylaxis with Protonix    All the records are reviewed and case discussed with ED provider. Management plans discussed with the patient, family and they are in agreement.  CODE STATUS: fc  TOTAL TIME TAKING CARE OF THIS PATIENT: 3minutes.   Note: This dictation was prepared with Dragon dictation along with smaller phrase technology. Any transcriptional errors  that result from this process are unintentional.  Nicholes Mango M.D on 08/15/2016 at 1:26 PM  Between 7am to 6pm - Pager - [phone]  After 6pm go to [email or web address] - password [email or web address] [facility]  Office  [phone]  CC: Primary care physician; Lynnell Jude, MD

## 2016-08-15 NOTE — Consult Note (Signed)
Cardiology Consultation Note  Patient ID: Rachel [name], MRN: [id number], DOB/AGE: Jan 26, 1956 60 y.o. Admit date: 08/15/2016   Date of Consult: 08/15/2016 Primary Physician: Lynnell Jude, MD Primary Cardiologist: Dr. Rockey Situ, MD Requesting Physician: Dr. Mar Daring, MD  Chief Complaint: Rectal bleeding Reason for Consult: Pre-operative evaluation   HPI: 60 y.o. female with h/o nonobstructive CAD by cardiac cath on 06/19/2014, syncope, collapse, HTN, and tobacco abuse who presented to [facility] for scheduled colonoscopy on 9/5 secondary to rectal bleeding. Colonoscopy showed partially obstructing 7 cm mass. Cardiology is consulted for pre-operative evaluation.   Patient previously underwent cardiac cath on 06/19/14 secondary to chest pain that showed diffuse 50% stenosis of the LAD and 50% stenosis of the proximal ramus. Medical management was advised at that time. She was last seen by cardiology as an inpatient in 07/2014 for chest and abdominal pain with no intervention being advised at that time. Medical therapy was continued. She was recently admitted to [facility] in early August 2017 for vertigo. Echo at that time showed EF 50-55%, no RWMA, GR1DD, trivial AI, trace MR, mild TR. MRI at that time showed no acute process. CTA head showed a no emergent large vessel occlusion. She was placed on Plavix at that time for vertgio/TIA.   She was seen by GI on 8/22 for rectal bleeding. CTA abdomen on 8/30 showed locally advanced rectal carcinoma. Her Plavix was held as of 6 days ago. She underwent planned colonoscopy on 9/5 that showed a 7 cm partially obstructing mass along the rectum. Surgery has been asked to evaluate the patient. Cardiology is now consulted for pre-operative evalaution.   She reports intermittent chest pain the occurs approximately 2 times weekly for the past several months. Pain is nonexertional and will typically last 5 minutes before self resolving. No associated symptoms. She last had the pain on  08/11/16. She is currently without pain.   Past Medical History:  Diagnosis Date  . Body mass index (BMI) of 24.0-24.9 in adult   . Cardiovascular disease   . Current every day smoker    a. ~35 years - > has cut down to < 3/4 ppd  . Headache   . History of UTI   . Hypertension   . Midsternal chest pain    a. 05/2014 Stress Echo: Ex time: 8:09, ECG w/o acute changes, EF nl with possible mid and apical anterior HK-->cath recommended.  . Stroke ([place])   . Syncope and collapse       Most Recent Cardiac Studies: As above   Surgical History:  Past Surgical History:  Procedure Laterality Date  . ABDOMINAL HYSTERECTOMY    . BACK SURGERY    . CHOLECYSTECTOMY    . ERCP    . PARTIAL HYSTERECTOMY       Home Meds: Prior to Admission medications   Medication Sig Start Date End Date Taking? Authorizing Provider  isosorbide mononitrate (IMDUR) 30 MG 24 hr tablet Take 1 tablet (30 mg total) by mouth daily. 06/03/14  Yes Rogelia Mire, NP  lisinopril (PRINIVIL,ZESTRIL) 10 MG tablet Take 10 mg by mouth daily.   Yes Historical Provider, MD  metoprolol tartrate (LOPRESSOR) 25 MG tablet Take 0.5 tablets (12.5 mg total) by mouth 2 (two) times daily. 06/03/14  Yes Rogelia Mire, NP  shark liver oil-cocoa butter (PREPARATION H) 0.25-3-85.5 % suppository Place 1 suppository rectally daily as needed for hemorrhoids.   Yes Historical Provider, MD  traMADol (ULTRAM) 50 MG tablet Take 50 mg by mouth  every 6 (six) hours as needed for moderate pain.   Yes Historical Provider, MD  clopidogrel (PLAVIX) 75 MG tablet Take 1 tablet (75 mg total) by mouth daily. 07/18/16   [name], MD  nitroGLYCERIN (NITROSTAT) 0.4 MG SL tablet Place 1 tablet (0.4 mg total) under the tongue every 5 (five) minutes as needed for chest pain. 06/03/14   Rogelia Mire, NP  omeprazole (PRILOSEC) 20 MG capsule Take 20 mg by mouth daily.    Historical Provider, MD    Inpatient Medications:  . isosorbide mononitrate  30  mg Oral Daily  . lisinopril  10 mg Oral Daily  . metoprolol tartrate  12.5 mg Oral BID  . nicotine  14 mg Transdermal Daily  . pantoprazole  40 mg Oral Daily      Allergies:  Allergies  Allergen Reactions  . Prednisone Other (See Comments)    BLISTERS IN MOUTH AND ON TONGUE    Social History   Social History  . Marital status: Married    Spouse name: N/A  . Number of children: N/A  . Years of education: N/A   Occupational History  . Not on file.   Social History Main Topics  . Smoking status: Current Every Day Smoker    Packs/day: 0.25    Years: 35.00    Types: Cigarettes  . Smokeless tobacco: Never Used  . Alcohol use No     Comment: occasional  . Drug use: No  . Sexual activity: Not on file   Other Topics Concern  . Not on file   Social History Narrative   Married.   Works for [employer].   Smokes ~3/4 ppd     Family History  Problem Relation Age of Onset  . Breast cancer Mother   . Hyperlipidemia Father   . Heart disease Paternal Grandmother   . Stroke Maternal Grandfather      Review of Systems: Review of Systems  Constitutional: Positive for malaise/fatigue and weight loss. Negative for chills, diaphoresis and fever.  HENT: Negative for congestion.   Eyes: Negative for discharge and redness.  Respiratory: Negative for cough, hemoptysis, sputum production, shortness of breath and wheezing.   Cardiovascular: Positive for chest pain. Negative for palpitations, orthopnea, claudication, leg swelling and PND.  Gastrointestinal: Positive for blood in stool. Negative for abdominal pain, heartburn, melena, nausea and vomiting.  Genitourinary: Negative for hematuria.  Musculoskeletal: Negative for falls and myalgias.  Skin: Negative for rash.  Neurological: Positive for weakness. Negative for dizziness, tingling, tremors, sensory change, speech change, focal weakness and loss of consciousness.  Endo/Heme/Allergies: Does not bruise/bleed easily.    Psychiatric/Behavioral: Positive for substance abuse. The patient is not nervous/anxious.   All other systems reviewed and are negative.   Labs: No results for input(s): CKTOTAL, CKMB, TROPONINI in the last 72 hours. Lab Results  Component Value Date   WBC 8.4 07/17/2016   HGB 13.0 07/17/2016   HCT 39.8 07/17/2016   MCV 90.5 07/17/2016   PLT 169 07/17/2016   No results for input(s): NA, K, CL, CO2, BUN, CREATININE, CALCIUM, PROT, BILITOT, ALKPHOS, ALT, AST, GLUCOSE in the last 168 hours.  Invalid input(s): LABALBU Lab Results  Component Value Date   CHOL 150 07/17/2016   HDL 55 07/17/2016   LDLCALC 85 07/17/2016   TRIG 48 07/17/2016   No results found for: DDIMER  Radiology/Studies:  Ct Angio Head W Or Wo Contrast  Result Date: 07/17/2016 CLINICAL DATA:  Prior code stroke. Acute  onset weakness, diaphoresis, headache and dizziness. History of hypertension and syncope. EXAM: CT ANGIOGRAPHY HEAD AND NECK TECHNIQUE: Multidetector CT imaging of the head and neck was performed using the standard protocol during bolus administration of intravenous contrast. Multiplanar CT image reconstructions and MIPs were obtained to evaluate the vascular anatomy. Carotid stenosis measurements (when applicable) are obtained utilizing NASCET criteria, using the distal internal carotid diameter as the denominator. CONTRAST:  50 cc Isovue 370 COMPARISON:  CT HEAD July 17, 2016 at 0049 hours FINDINGS: CTA NECK AORTIC ARCH: Normal appearance of the thoracic arch, Common origin innominate and LEFT Common carotid artery. Mild calcific atherosclerosis of the aortic arch and LEFT subclavian artery origin. The origins of the innominate, left Common carotid artery and subclavian artery are widely patent. RIGHT CAROTID SYSTEM: Common carotid artery is widely patent, coursing in a straight line fashion. Normal appearance of the carotid bifurcation without hemodynamically significant stenosis by NASCET criteria. Normal  appearance of the included internal carotid artery. LEFT CAROTID SYSTEM: Common carotid artery is widely patent, coursing in a straight line fashion. Normal appearance of the carotid bifurcation without hemodynamically significant stenosis by NASCET criteria. Mild eccentric calcific atherosclerosis of LEFT internal carotid artery origin. Normal appearance of the included internal carotid artery. VERTEBRAL ARTERIES:Venous contamination limits assessment of proximal vertebral arteries. Left vertebral artery is dominant. Normal appearance of the vertebral arteries, which appear widely patent. Multilevel moderate degenerative disc severe RIGHT C4-5, severe LEFT C6-7 neural foraminal narrowing moderate to severe bilateral C7-T1 neural foraminal narrowing. Patient is edentulous. OTHER NECK: Soft tissues of the neck are non-acute though, not tailored for evaluation. Mild centrilobular emphysema and apical pleural scarring. 26 x 19 mm solid RIGHT thyroid nodule. CTA HEAD ANTERIOR CIRCULATION: Normal appearance of the cervical internal carotid arteries, petrous, cavernous and supra clinoid internal carotid arteries. Widely patent anterior communicating artery. Normal appearance of the anterior and middle cerebral arteries. Mild luminal regularity of the mid to distal anterior cerebral arteries and middle cerebral arteries. No large vessel occlusion, hemodynamically significant stenosis, dissection, contrast extravasation or aneurysm. POSTERIOR CIRCULATION: Normal appearance of the vertebral arteries, vertebrobasilar junction and basilar artery, as well as main branch vessels. Bilateral posterior cerebral arteries are patent. Mild stenosis LEFT P2 segment. No large vessel occlusion, hemodynamically significant stenosis, dissection, luminal irregularity, contrast extravasation or aneurysm. VENOUS SINUSES: Major dural venous sinuses are patent though not tailored for evaluation on this angiographic examination. ANATOMIC  VARIANTS: None. DELAYED PHASE: Not performed. IMPRESSION: CTA NECK: Atherosclerosis without hemodynamically significant stenosis or acute vascular process. 2.6 cm RIGHT thyroid nodule for which follow up thyroid sonogram is recommended on a nonemergent basis. CTA HEAD:  No emergent large vessel occlusion or severe stenosis. Mild luminal regularity/ stenosis of the intracranial vessels compatible with atherosclerosis. Electronically Signed   By: Elon Alas M.D.   On: 07/17/2016 02:02   Ct Angio Neck W Or Wo Contrast  Result Date: 07/17/2016 CLINICAL DATA:  Prior code stroke. Acute onset weakness, diaphoresis, headache and dizziness. History of hypertension and syncope. EXAM: CT ANGIOGRAPHY HEAD AND NECK TECHNIQUE: Multidetector CT imaging of the head and neck was performed using the standard protocol during bolus administration of intravenous contrast. Multiplanar CT image reconstructions and MIPs were obtained to evaluate the vascular anatomy. Carotid stenosis measurements (when applicable) are obtained utilizing NASCET criteria, using the distal internal carotid diameter as the denominator. CONTRAST:  50 cc Isovue 370 COMPARISON:  CT HEAD July 17, 2016 at 0049 hours FINDINGS: CTA NECK AORTIC ARCH: Normal appearance  of the thoracic arch, Common origin innominate and LEFT Common carotid artery. Mild calcific atherosclerosis of the aortic arch and LEFT subclavian artery origin. The origins of the innominate, left Common carotid artery and subclavian artery are widely patent. RIGHT CAROTID SYSTEM: Common carotid artery is widely patent, coursing in a straight line fashion. Normal appearance of the carotid bifurcation without hemodynamically significant stenosis by NASCET criteria. Normal appearance of the included internal carotid artery. LEFT CAROTID SYSTEM: Common carotid artery is widely patent, coursing in a straight line fashion. Normal appearance of the carotid bifurcation without hemodynamically  significant stenosis by NASCET criteria. Mild eccentric calcific atherosclerosis of LEFT internal carotid artery origin. Normal appearance of the included internal carotid artery. VERTEBRAL ARTERIES:Venous contamination limits assessment of proximal vertebral arteries. Left vertebral artery is dominant. Normal appearance of the vertebral arteries, which appear widely patent. Multilevel moderate degenerative disc severe RIGHT C4-5, severe LEFT C6-7 neural foraminal narrowing moderate to severe bilateral C7-T1 neural foraminal narrowing. Patient is edentulous. OTHER NECK: Soft tissues of the neck are non-acute though, not tailored for evaluation. Mild centrilobular emphysema and apical pleural scarring. 26 x 19 mm solid RIGHT thyroid nodule. CTA HEAD ANTERIOR CIRCULATION: Normal appearance of the cervical internal carotid arteries, petrous, cavernous and supra clinoid internal carotid arteries. Widely patent anterior communicating artery. Normal appearance of the anterior and middle cerebral arteries. Mild luminal regularity of the mid to distal anterior cerebral arteries and middle cerebral arteries. No large vessel occlusion, hemodynamically significant stenosis, dissection, contrast extravasation or aneurysm. POSTERIOR CIRCULATION: Normal appearance of the vertebral arteries, vertebrobasilar junction and basilar artery, as well as main branch vessels. Bilateral posterior cerebral arteries are patent. Mild stenosis LEFT P2 segment. No large vessel occlusion, hemodynamically significant stenosis, dissection, luminal irregularity, contrast extravasation or aneurysm. VENOUS SINUSES: Major dural venous sinuses are patent though not tailored for evaluation on this angiographic examination. ANATOMIC VARIANTS: None. DELAYED PHASE: Not performed. IMPRESSION: CTA NECK: Atherosclerosis without hemodynamically significant stenosis or acute vascular process. 2.6 cm RIGHT thyroid nodule for which follow up thyroid sonogram is  recommended on a nonemergent basis. CTA HEAD:  No emergent large vessel occlusion or severe stenosis. Mild luminal regularity/ stenosis of the intracranial vessels compatible with atherosclerosis. Electronically Signed   By: Elon Alas M.D.   On: 07/17/2016 02:02   Mr Brain Wo Contrast  Result Date: 07/17/2016 CLINICAL DATA:  Prior code stroke. Acute onset weakness, possible RIGHT-sided weakness, diaphoresis, headache and dizziness. History of hypertension and syncope. EXAM: MRI HEAD WITHOUT CONTRAST TECHNIQUE: Multiplanar, multiecho pulse sequences of the brain and surrounding structures were obtained without intravenous contrast. COMPARISON:  CT HEAD July 17, 2016 at 0049 hours FINDINGS: INTRACRANIAL CONTENTS: No reduced diffusion to suggest acute ischemia. No susceptibility artifact to suggest hemorrhage. The ventricles and sulci are normal for patient's age. Scattered subcentimeter supratentorial white matter FLAIR T2 hyperintensities most compatible with chronic small vessel ischemic disease, normal for patient's age. No suspicious parenchymal signal, masses or mass effect. No abnormal extra-axial fluid collections. No extra-axial masses though, contrast enhanced sequences would be more sensitive. Normal major intracranial vascular flow voids present at skull base. ORBITS: The included ocular globes and orbital contents are non-suspicious. SINUSES: Atretic RIGHT maxillary sinus compatible chronic sinusitis. Mastoid air cells are well aerated. SKULL/SOFT TISSUES: No abnormal sellar expansion. No suspicious calvarial bone marrow signal. Craniocervical junction maintained. Patient is edentulous. IMPRESSION: No acute intracranial process. Mild white matter changes most compatible with chronic small vessel ischemic disease. Electronically Signed   By: Sandie Ano  Bloomer M.D.   On: 07/17/2016 05:02   Ct Abdomen Pelvis W Contrast  Result Date: 08/09/2016 CLINICAL DATA:  Worsening lower abdominal pain.  Bright red blood per rectum. " "Rectal mass." Prior hysterectomy and cholecystectomy. EXAM: CT ABDOMEN AND PELVIS WITH CONTRAST TECHNIQUE: Multidetector CT imaging of the abdomen and pelvis was performed using the standard protocol following bolus administration of intravenous contrast. CONTRAST:  166mL ISOVUE-300 IOPAMIDOL (ISOVUE-300) INJECTION 61% COMPARISON:  09/15/2014 and 07/22/2014 . FINDINGS: Lower chest: right middle lobe bronchiectasis and mucous plugging are mild and similar. More subtle findings also identified within the lingula. Borderline cardiomegaly, without pericardial or pleural effusion Hepatobiliary: No focal liver lesion. Pneumobilia. Cholecystectomy, without biliary ductal dilatation. Pancreas: Normal, without mass or ductal dilatation. Spleen:  Normal in size, without focal abnormality. Adrenals/Urinary Tract: Bilateral adrenal thickening, without well-defined dominant lesion. This similar. An interpolar left renal hypo attenuating lesion is favored to represent a cyst or minimally complex cyst. This is unchanged in size back to 8/12/ 2015, 11 mm. Normal right kidney, without hydronephrosis. Normal urinary bladder. Stomach/Bowel: Normal stomach, without wall thickening. Rectal mass, as evidenced by irregular asymmetric wall thickening including on images 64-67/series 2. Probable direct tumor extension into the mesorectum (versus a contiguous necrotic node). This measures 2.3 x 2.0 cm at the 8 o'clock position on image 63/series 2. No high-grade obstruction is seen. There is large colonic stool burden throughout the more proximal colon. Normal terminal ileum and appendix. Normal small bowel. Vascular/Lymphatic: Aortic and branch vessel atherosclerosis. No retroperitoneal or retrocrural adenopathy. No pelvic sidewall adenopathy. A node in the sigmoid mesocolon measures 6 mm on image 56/series 2, upper normal. Right-sided mesorectal nodes measure up to 8 mm on image 63/series 2, suspicious  Reproductive: Hysterectomy.  No adnexal mass. Other: No significant free fluid. No evidence of omental or peritoneal disease. 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 IMPRESSION: 1. Locally advanced rectal carcinoma. Probable direct extension into the mesorectum with suspicious perirectal and sigmoid meso colon nodes. 2. No evidence of distant metastasis. 3. No high-grade obstruction. There is proximal large volume colonic stool, suggesting a component of constipation. 4. Right middle lobe and lingular findings which are likely related to chronic atypical infection and are similar to 2015. Electronically Signed   By: Abigail Miyamoto M.D.   On: 08/09/2016 15:54   Ct Head Code Stroke W/o Cm  Result Date: 07/17/2016 CLINICAL DATA:  Code stroke. Acute onset weakness prior to arrival, diaphoresis, headache, dizziness. History of hypertension, syncope. EXAM: CT HEAD WITHOUT CONTRAST TECHNIQUE: Contiguous axial images were obtained from the base of the skull through the vertex without intravenous contrast. COMPARISON:  CT HEAD January 23, 2012 FINDINGS: INTRACRANIAL CONTENTS: The ventricles and sulci are normal. No intraparenchymal hemorrhage, mass effect nor midline shift. No acute large vascular territory infarcts. No abnormal extra-axial fluid collections. Basal cisterns are patent. Trace calcific atherosclerosis of the carotid siphons. ORBITS: The included ocular globes and orbital contents are normal. SINUSES: The mastoid aircells and included paranasal sinuses are well-aerated. SKULL/SOFT TISSUES: No skull fracture. No significant soft tissue swelling. Moderate LEFT temporomandibular osteoarthrosis. ASPECTS [facility] Stroke Program Early CT Score, [email or web address]) - Ganglionic level infarction (caudate, lentiform nuclei, internal capsule, insula, M1-M3 cortex): 7 - Supraganglionic infarction (M4-M6 cortex): 3 Total score (0-10 with 10 being normal): 10 IMPRESSION: 1. No acute  intracranial process ; negative CT HEAD for age. 2. ASPECTS score 10. Acute findings discussed with and reconfirmed by Dr.CHRISTOPHER POLLINA on 07/17/2016 at 12:50 am. Electronically Signed  By: Elon Alas M.D.   On: 07/17/2016 00:52    EKG: no EKG ordered at time of admission or upon cardiology seeing patient  Telemetry: not on telemetry   Weights: Filed Weights   08/15/16 1013 08/15/16 1348  Weight: 143 lb (64.9 kg) 142 lb 12.8 oz (64.8 kg)     Physical Exam: Blood pressure (!) 158/72, pulse (!) 49, temperature 97.9 F (36.6 C), temperature source Oral, resp. rate 20, height 5\' 6"  (1.676 m), weight 142 lb 12.8 oz (64.8 kg), SpO2 99 %. Body mass index is 23.05 kg/m. General: Well developed, well nourished, in no acute distress. Head: Normocephalic, atraumatic, sclera non-icteric, no xanthomas, nares are without discharge.  Neck: Negative for carotid bruits. JVD not elevated. Lungs: Clear bilaterally to auscultation without wheezes, rales, or rhonchi. Breathing is unlabored. Heart: RRR with S1 S2. No murmurs, rubs, or gallops appreciated. Abdomen: Soft, non-tender, non-distended with normoactive bowel sounds. No hepatomegaly. No rebound/guarding. No obvious abdominal masses. Msk:  Strength and tone appear normal for age. Extremities: No clubbing or cyanosis. No edema. Distal pedal pulses are 2+ and equal bilaterally. Neuro: Alert and oriented X 3. No facial asymmetry. No focal deficit. Moves all extremities spontaneously. Psych:  Responds to questions appropriately with a normal affect.    Assessment and Plan:  Principal Problem:   Rectal mass Active Problems:   Current every day smoker   Chest pain with moderate risk for cardiac etiology   Coronary artery disease, non-occlusive    1. Pre-operative evaluation:  -Given her intermittent chest pain would like to schedule Lexiscan Myoview on the morning of 9/6 as she has continued to smoke tobacco and her last ischemic  evaluation was 06/2014 if this does not interfere with her surgical treatment -She has been off Plaivx x 6 days (on this for vertigo/TIA), defer management of Plavix to IM/Neuro  -Recent echo 07/2016 showing normal EF  -Will order EKG  2. Nonobstructive CAD:  -As above -Continue medical therapy   3. Rectal mass:  -Per GI/surgery  4. Tobacco abuse: -Cessation advised     Signed, Christell Faith, PA-C [facility] Pager: [phone] 08/15/2016, 2:26 PM

## 2016-08-15 NOTE — Anesthesia Postprocedure Evaluation (Signed)
Anesthesia Post Note  Patient: Rachel [name]  Procedure(s) Performed: Procedure(s) (LRB): COLONOSCOPY WITH PROPOFOL (N/A)  Patient location during evaluation: Endoscopy Anesthesia Type: General Level of consciousness: awake and alert and oriented Pain management: pain level controlled Vital Signs Assessment: post-procedure vital signs reviewed and stable Respiratory status: spontaneous breathing, nonlabored ventilation and respiratory function stable Cardiovascular status: blood pressure returned to baseline and stable Postop Assessment: no signs of nausea or vomiting Anesthetic complications: no    Last Vitals:  Vitals:   08/15/16 1222 08/15/16 1242  BP: (!) 171/100 (!) 167/90  Pulse: (!) 57 (!) 50  Resp: 18 18  Temp:      Last Pain:  Vitals:   08/15/16 1205  TempSrc: Tympanic                 Amana Bouska

## 2016-08-15 NOTE — H&P (Signed)
Outpatient short stay form Pre-procedure 08/15/2016 11:32 AM Rachel Sails MD  Primary Physician: Dr. Clemmie Krill  Reason for visit:  Rectal bleeding, abnormal CT scan of the rectum, rectal mass on digital examination.  History of present illness:  Patient is a 60 year old female presenting today as above. She was sent from her primary doctor in regards constipation or rectal bleeding. There is a mass noted on digital rectal examination. CT scan concurs with that. She is presenting today for colonoscopy. She had some nausea and emesis with her prep however states she was going clear this morning. She does take Plavix however held that 6 days ago. She takes no aspirin products    Current Facility-Administered Medications:  .  0.9 %  sodium chloride infusion, , Intravenous, Continuous, Rachel Sails, MD, Last Rate: 20 mL/hr at 08/15/16 1029 .  0.9 %  sodium chloride infusion, , Intravenous, Continuous, Rachel Sails, MD  Facility-Administered Medications Ordered in Other Encounters:  .  fentaNYL (SUBLIMAZE) injection, , , Anesthesia Intra-op, Cindy Cook-Emogene [place]alla, 50 mcg at 08/15/16 1132 .  midazolam (VERSED) injection, , , Anesthesia Intra-op, Cindy Cook-Rayvon Brandvold, 1 mg at 08/15/16 1132  Prescriptions Prior to Admission  Medication Sig Dispense Refill Last Dose  . isosorbide mononitrate (IMDUR) 30 MG 24 hr tablet Take 1 tablet (30 mg total) by mouth daily. 90 tablet 3 08/14/2016 at Unknown time  . lisinopril (PRINIVIL,ZESTRIL) 10 MG tablet Take 10 mg by mouth daily.   08/14/2016 at Unknown time  . metoprolol tartrate (LOPRESSOR) 25 MG tablet Take 0.5 tablets (12.5 mg total) by mouth 2 (two) times daily. 90 tablet 3 08/14/2016 at Unknown time  . shark liver oil-cocoa butter (PREPARATION H) 0.25-3-85.5 % suppository Place 1 suppository rectally daily as needed for hemorrhoids.   Past Week at Unknown time  . traMADol (ULTRAM) 50 MG tablet Take 50 mg by mouth every 6 (six) hours as needed for moderate  pain.   08/14/2016 at Unknown time  . clopidogrel (PLAVIX) 75 MG tablet Take 1 tablet (75 mg total) by mouth daily. 30 tablet 0 08/09/2016  . nitroGLYCERIN (NITROSTAT) 0.4 MG SL tablet Place 1 tablet (0.4 mg total) under the tongue every 5 (five) minutes as needed for chest pain. 25 tablet 6 2014  . omeprazole (PRILOSEC) 20 MG capsule Take 20 mg by mouth daily.   Not Taking at Unknown time     Allergies  Allergen Reactions  . Prednisone Other (See Comments)    BLISTERS IN MOUTH AND ON TONGUE     Past Medical History:  Diagnosis Date  . Body mass index (BMI) of 24.0-24.9 in adult   . Cardiovascular disease   . Current every day smoker    a. ~35 years - > has cut down to < 3/4 ppd  . Headache   . History of UTI   . Hypertension   . Midsternal chest pain    a. 05/2014 Stress Echo: Ex time: 8:09, ECG w/o acute changes, EF nl with possible mid and apical anterior HK-->cath recommended.  . Stroke ([place])   . Syncope and collapse     Review of systems:      Physical Exam    Heart and lungs: Irregular rate and rhythm without rub or gallop, lungs are bilaterally clear.    HEENT: Normocephalic atraumatic eyes are anicteric    Other:     Pertinant exam for procedure: Soft nontender nondistended bowel sounds positive normoactive.    Planned proceedures: Colonoscopy and indicated procedures.  I have discussed the risks benefits and complications of procedures to include not limited to bleeding, infection, perforation and the risk of sedation and the patient wishes to proceed.    Rachel Sails, MD Gastroenterology 08/15/2016  11:32 AM

## 2016-08-15 NOTE — Consult Note (Signed)
Reason for Consult rectal mass Referring Physician: [name] is an 59 y.o. female.  HPI: This is an 60 year old female who underwent colonoscopy this morning she was found to have a large rectal mass consistent with a carcinoma. Scope could not be passed past this lesion. Multiple biopsies were obtained. This patient has had several months to few months history of foot change in bowel habits and some rectal bleeding off and on which has become more prominent of recent. She also complained of some vague lower abdominal pain and low back pain. She has noticed that she is a little more tired in the last few weeks and may have lost about 203 pounds and that period of time. She denies any nausea or vomiting or abdominal distention. Otherwise she is continuing to work and feels okay. She has not had a previous colonoscopy. Approximately a month or so ago the patient had an episode of significant dizziness and vertigo-like symptoms. She was evaluated by a stroke team and concluded that this was an episode of TIA and the patient has been placed on Plavix. Patient had a CT scan of the abdomen and pelvis done last week which showed a large rectal mass with the evidence of focal extension in the perirectal area. No metastatic disease within the abdomen was noted.  Past Medical History:  Diagnosis Date  . Body mass index (BMI) of 24.0-24.9 in adult   . Cardiovascular disease   . Current every day smoker    a. ~35 years - > has cut down to < 3/4 ppd  . Headache   . History of UTI   . Hypertension   . Midsternal chest pain    a. 05/2014 Stress Echo: Ex time: 8:09, ECG w/o acute changes, EF nl with possible mid and apical anterior HK-->cath recommended.  . Stroke ([place])   . Syncope and collapse     Past Surgical History:  Procedure Laterality Date  . ABDOMINAL HYSTERECTOMY    . BACK SURGERY    . CHOLECYSTECTOMY    . ERCP    . PARTIAL HYSTERECTOMY      Family History  Problem Relation  Age of Onset  . Breast cancer Mother   . Hyperlipidemia Father   . Heart disease Paternal Grandmother   . Stroke Maternal Grandfather     Social History:  reports that she has been smoking Cigarettes.  She has a 8.75 pack-year smoking history. She has never used smokeless tobacco. She reports that she does not drink alcohol or use drugs.  Allergies:  Allergies  Allergen Reactions  . Prednisone Other (See Comments)    BLISTERS IN MOUTH AND ON TONGUE    Medications: I have reviewed the patient's current medications.  No results found for this or any previous visit (from the past 48 hour(s)).  No results found.  Review of Systems  Constitutional: Positive for malaise/fatigue and weight loss.  Respiratory: Negative.   Cardiovascular: Negative.   Gastrointestinal: Positive for abdominal pain and blood in stool. Negative for heartburn, nausea and vomiting.  Genitourinary: Negative.   Neurological: Positive for dizziness.   Blood pressure (!) 158/72, pulse (!) 49, temperature 97.9 F (36.6 C), temperature source Oral, resp. rate 20, height 5\' 6"  (1.676 m), weight 142 lb 12.8 oz (64.8 kg), SpO2 99 %. Physical Exam  Constitutional: She is oriented to person, place, and time. She appears well-developed and well-nourished.  HENT:  Head: Normocephalic.  Eyes: Conjunctivae are normal. No scleral icterus.  Neck: Neck supple. No thyromegaly present.  Cardiovascular: Normal rate, regular rhythm and normal heart sounds.   Respiratory: Effort normal and breath sounds normal.  GI: Soft. Bowel sounds are normal. She exhibits no distension. There is no hepatomegaly. There is no tenderness.  Lymphadenopathy:    She has no cervical adenopathy.    She has no axillary adenopathy.       Right: No inguinal adenopathy present.       Left: No inguinal adenopathy present.  Neurological: She is alert and oriented to person, place, and time.  Skin: Skin is warm and dry.  Digital rectal exam shows the  patient has a hard circumferential mass approximately 5-6 cm from the anal verge  Assessment/Plan: The patient had normal liver function was approximately a month ago. She has not had a CEA done. CT scan was reviewed. 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 Given the local extension of the large size of the tumor she will be best served by chemoradiation followed by more than likely abdominoperineal resection. At present she does not have any antegrade obstruction. CT scan and abdominal exam does not reveal any significant obstruction that would warrant a colostomy at this time. Situation was discussed in full with the patient and with Dr. Margaretmary Eddy and also requested consult from Dr. Rogue Bussing from oncology service. Hope to have a confirmation of diagnosis from pathology tomorrow. CEA has been ordered. Will follow. Thank you for allowing me to evaluate and help in the care of this patient.  Rachel [name] 08/15/2016, 4:31 PM

## 2016-08-15 NOTE — Progress Notes (Signed)
Patient arrived from Endo. Madlyn Frankel, RN

## 2016-08-15 NOTE — Anesthesia Preprocedure Evaluation (Signed)
Anesthesia Evaluation  Patient identified by MRN, date of birth, ID band Patient awake    Reviewed: Allergy & Precautions, NPO status , Patient's Chart, lab work & pertinent test results, reviewed documented beta blocker date and time   History of Anesthesia Complications Negative for: history of anesthetic complications  Airway Mallampati: II  TM Distance: >3 FB Neck ROM: Full    Dental  (+) Upper Dentures, Lower Dentures   Pulmonary neg sleep apnea, neg COPD, Current Smoker,    breath sounds clear to auscultation- rhonchi (-) wheezing      Cardiovascular hypertension, Pt. on medications and Pt. on home beta blockers (-) CAD and (-) Past MI  Rhythm:Regular Rate:Normal - Systolic murmurs and - Diastolic murmurs    Neuro/Psych  Headaches, CVA, No Residual Symptoms negative psych ROS   GI/Hepatic negative GI ROS, Neg liver ROS,   Endo/Other  negative endocrine ROSneg diabetes  Renal/GU negative Renal ROS     Musculoskeletal   Abdominal (+) - obese,   Peds  Hematology negative hematology ROS (+)   Anesthesia Other Findings Past Medical History: No date: Body mass index (BMI) of 24.0-24.9 in adult No date: Cardiovascular disease No date: Current every day smoker     Comment: a. ~35 years - > has cut down to < 3/4 ppd No date: Headache No date: History of UTI No date: Hypertension No date: Midsternal chest pain     Comment: a. 05/2014 Stress Echo: Ex time: 8:09, ECG w/o               acute changes, EF nl with possible mid and               apical anterior HK-->cath recommended. No date: Stroke [facility]) No date: Syncope and collapse   Reproductive/Obstetrics                             Anesthesia Physical Anesthesia Plan  ASA: III  Anesthesia Plan: General   Post-op Pain Management:    Induction: Intravenous  Airway Management Planned: Natural Airway  Additional Equipment:    Intra-op Plan:   Post-operative Plan:   Informed Consent: I have reviewed the patients History and Physical, chart, labs and discussed the procedure including the risks, benefits and alternatives for the proposed anesthesia with the patient or authorized representative who has indicated his/her understanding and acceptance.   Dental advisory given  Plan Discussed with: CRNA and Anesthesiologist  Anesthesia Plan Comments:         Anesthesia Quick Evaluation

## 2016-08-15 NOTE — Op Note (Signed)
[facility] Gastroenterology Patient Name: Rachel [name] Procedure Date: 08/15/2016 11:30 AM MRN: [id number] Account #: [id number] Date of Birth: 04/20/1956 Admit Type: Outpatient Age: 60 Room: [facility] ENDO ROOM 3 Gender: Female Note Status: Finalized Procedure:            Colonoscopy Indications:          Rectal bleeding, Abnormal CT of the GI tract, Abnormal                        rectal exam Providers:            Lollie Sails, MD Referring MD:         Lynnell Jude (Referring MD) Medicines:            Monitored Anesthesia Care Complications:        No immediate complications. Procedure:            Pre-Anesthesia Assessment:                       - ASA Grade Assessment: II - A patient with mild                        systemic disease.                       After obtaining informed consent, the colonoscope was                        passed under direct vision. Throughout the procedure,                        the patient's blood pressure, pulse, and oxygen                        saturations were monitored continuously. The                        Colonoscope was introduced through the anus with the                        intention of advancing to the cecum. The scope was                        advanced to the rectum before the procedure was                        aborted. Medications were given. The colonoscopy was                        unusually difficult due to near total obstructionin the                        upper rectum. The patient tolerated the procedure well.                        The quality of the bowel preparation was good. Findings:      the scope was advanced about 6-7 cm from the anal verge where an obvoule       mass is seen. It was not possible to advance beyond another 2 cm due to  the luminal narrrowing caused by the mass.      A fungating and infiltrative partially obstructing large mass was found       at 7 cm proximal to the anus.  The mass was circumferential. The mass       measured Unable to ascertain length of lesion due to near total       obstruction. cm in length. Oozing was present. Biopsies were taken with       a cold forceps for histology. Impression:           - Likely malignant partially obstructing tumor at 7 cm                        proximal to the anus. Biopsied. Recommendation:       - Full liquid diet. Consider admission for further                        evaluation and management of this near obstructing                        lesion. Procedure Code(s):    --- Professional ---                       [phone], 55, Colonoscopy, flexible; with biopsy, single                        or multiple Diagnosis Code(s):    --- Professional ---                       D49.0, Neoplasm of unspecified behavior of digestive                        system                       K62.5, Hemorrhage of anus and rectum                       K62.89, Other specified diseases of anus and rectum                       R93.3, Abnormal findings on diagnostic imaging of other                        parts of digestive tract CPT copyright 2016 American Medical Association. All rights reserved. The codes documented in this report are preliminary and upon coder review may  be revised to meet current compliance requirements. Lollie Sails, MD 08/15/2016 12:09:19 PM This report has been signed electronically. Number of Addenda: 0 Note Initiated On: 08/15/2016 11:30 AM Total Procedure Duration: 0 hours 13 minutes 13 seconds       [facility]

## 2016-08-16 ENCOUNTER — Encounter: Payer: Self-pay | Admitting: Gastroenterology

## 2016-08-16 ENCOUNTER — Telehealth: Payer: Self-pay

## 2016-08-16 ENCOUNTER — Other Ambulatory Visit: Payer: Self-pay | Admitting: General Surgery

## 2016-08-16 ENCOUNTER — Telehealth: Payer: Self-pay | Admitting: Internal Medicine

## 2016-08-16 ENCOUNTER — Observation Stay (HOSPITAL_BASED_OUTPATIENT_CLINIC_OR_DEPARTMENT_OTHER): Payer: BLUE CROSS/BLUE SHIELD

## 2016-08-16 ENCOUNTER — Other Ambulatory Visit: Payer: Self-pay | Admitting: Internal Medicine

## 2016-08-16 DIAGNOSIS — R079 Chest pain, unspecified: Secondary | ICD-10-CM

## 2016-08-16 DIAGNOSIS — C20 Malignant neoplasm of rectum: Secondary | ICD-10-CM

## 2016-08-16 DIAGNOSIS — I10 Essential (primary) hypertension: Secondary | ICD-10-CM

## 2016-08-16 DIAGNOSIS — C775 Secondary and unspecified malignant neoplasm of intrapelvic lymph nodes: Principal | ICD-10-CM

## 2016-08-16 DIAGNOSIS — F17210 Nicotine dependence, cigarettes, uncomplicated: Secondary | ICD-10-CM

## 2016-08-16 DIAGNOSIS — Z8673 Personal history of transient ischemic attack (TIA), and cerebral infarction without residual deficits: Secondary | ICD-10-CM | POA: Diagnosis not present

## 2016-08-16 LAB — CBC
HCT: 38.9 % (ref 35.0–47.0)
HEMOGLOBIN: 13.6 g/dL (ref 12.0–16.0)
MCH: 30.6 pg (ref 26.0–34.0)
MCHC: 35 g/dL (ref 32.0–36.0)
MCV: 87.3 fL (ref 80.0–100.0)
PLATELETS: 132 10*3/uL — AB (ref 150–440)
RBC: 4.45 MIL/uL (ref 3.80–5.20)
RDW: 14.5 % (ref 11.5–14.5)
WBC: 6.3 10*3/uL (ref 3.6–11.0)

## 2016-08-16 LAB — NM MYOCAR MULTI W/SPECT W/WALL MOTION / EF
CHL CUP RESTING HR STRESS: 57 {beats}/min
CSEPHR: 68 %
CSEPPHR: 109 {beats}/min
LVDIAVOL: 65 mL (ref 46–106)
LVSYSVOL: 23 mL
SDS: 20
SRS: 0
SSS: 38
TID: 0.83

## 2016-08-16 LAB — BASIC METABOLIC PANEL
ANION GAP: 5 (ref 5–15)
BUN: 11 mg/dL (ref 6–20)
CHLORIDE: 108 mmol/L (ref 101–111)
CO2: 29 mmol/L (ref 22–32)
CREATININE: 0.52 mg/dL (ref 0.44–1.00)
Calcium: 8.7 mg/dL — ABNORMAL LOW (ref 8.9–10.3)
GFR calc non Af Amer: >60 mL/min (ref >60)
Glucose, Bld: 78 mg/dL (ref 65–99)
Potassium: 2.9 mmol/L — ABNORMAL LOW (ref 3.5–5.1)
SODIUM: 142 mmol/L (ref 135–145)

## 2016-08-16 LAB — POTASSIUM: POTASSIUM: 4 mmol/L (ref 3.5–5.1)

## 2016-08-16 LAB — MAGNESIUM: Magnesium: 2 mg/dL (ref 1.7–2.4)

## 2016-08-16 LAB — SURGICAL PATHOLOGY

## 2016-08-16 MED ORDER — AMLODIPINE BESYLATE 10 MG PO TABS: 10.0000 mg | ORAL_TABLET | Freq: Every evening | ORAL | 0 refills | Status: DC

## 2016-08-16 MED ORDER — TECHNETIUM TC 99M TETROFOSMIN IV KIT
13.0000 | PACK | Freq: Once | INTRAVENOUS | Status: AC | PRN
  Administered 2016-08-16: 12.98 via INTRAVENOUS

## 2016-08-16 MED ORDER — POTASSIUM CHLORIDE 20 MEQ PO PACK
60.0000 meq | PACK | Freq: Once | ORAL | Status: AC
  Administered 2016-08-16: 60 meq via ORAL
  Filled 2016-08-16: qty 3

## 2016-08-16 MED ORDER — TECHNETIUM TC 99M TETROFOSMIN IV KIT
30.0000 | PACK | Freq: Once | INTRAVENOUS | Status: AC | PRN
  Administered 2016-08-16: 11:00:00 29.594 via INTRAVENOUS

## 2016-08-16 MED ORDER — OXYCODONE HCL 5 MG PO TABS: 5.0000 mg | ORAL_TABLET | ORAL | 0 refills | Status: DC | PRN

## 2016-08-16 MED ORDER — REGADENOSON 0.4 MG/5ML IV SOLN
0.4000 mg | Freq: Once | INTRAVENOUS | Status: AC
  Administered 2016-08-16: 0.4 mg via INTRAVENOUS

## 2016-08-16 NOTE — Discharge Instructions (Signed)
YOUR PLAVIX IS on hold due to up coming port placement on Friday. D/w Dr Jamal Collin when is it appropriate to start

## 2016-08-16 NOTE — Progress Notes (Signed)
Discharge paperwork reviewed with patient who verbalized understanding. Oxycodone prescription attached to paperwork. Patient family to transport home.

## 2016-08-16 NOTE — Progress Notes (Signed)
CONSULT NOTE - INITIAL  Pharmacy Consult for electrolytes    Allergies  Allergen Reactions  . Prednisone Other (See Comments)    BLISTERS IN MOUTH AND ON TONGUE     Recent Labs  08/16/16 0444 08/16/16 1322  NA 142  --   K 2.9* 4.0  CL 108  --   CO2 29  --   GLUCOSE 78  --   BUN 11  --   CREATININE 0.52  --   CALCIUM 8.7*  --   MG  --  2.0   Estimated Creatinine Clearance: 69.1 mL/min (by C-G formula based on SCr of 0.8 mg/dL).   No results for input(s): GLUCAP in the last 72 hours.   Assessment: 60 yo patient with constipation, rectal bleeding, and hypokalemia.   9/6 0444: K+ 2.9 - Replaced with KCL 34mEq  9/6 1322: K+ 4.0      Mg: 2.0   Plan:  Electrolytes now WNL. No replacement needed at this time.   Nancy Fetter, PharmD Clinical Pharmacist 08/16/2016 1:56 PM

## 2016-08-16 NOTE — Telephone Encounter (Signed)
Spoke with patient about surgery for a port placement. She is amendable to this as she has spoken with Dr Jamal Collin about this. The patient is scheduled for surgery at [facility] on 08/18/16. She will pre admit by phone. She is aware to call on 08/17/16 to get her arrival time. The patient is aware of date and instructions.

## 2016-08-16 NOTE — Progress Notes (Signed)
Stress test result No ischemia, normal wall motion Overall a normal study EF 70% No further cardiac workup needed, Acceptable risk for chemio, XRT and surgery  Signed, Esmond Plants, MD, Ph.D [facility]

## 2016-08-16 NOTE — Discharge Summary (Signed)
[facility] [place] NAME: Rachel [name]    MR#:  [id number]  DATE OF BIRTH:  [birth year]  DATE OF ADMISSION:  08/15/2016 ADMITTING PHYSICIAN: Lollie Sails, MD  DATE OF DISCHARGE: 08/16/16  PRIMARY CARE PHYSICIAN: BLISS, Lynnell Jude, MD    ADMISSION DIAGNOSIS:  RECTAL MASS  DISCHARGE DIAGNOSIS:  Invasive Adenocarcinoma (new)  SECONDARY DIAGNOSIS:   Past Medical History:  Diagnosis Date  . Body mass index (BMI) of 24.0-24.9 in adult   . Cardiovascular disease   . Current every day smoker    a. ~35 years - > has cut down to < 3/4 ppd  . Headache   . History of UTI   . Hypertension   . Midsternal chest pain    a. 05/2014 Stress Echo: Ex time: 8:09, ECG w/o acute changes, EF nl with possible mid and apical anterior HK-->cath recommended.  . Stroke ([place])   . Syncope and collapse     HOSPITAL COURSE:   60 yr old female Came in to the endoscopy unit today for outpatient colonoscopy as she was experiencing constipation and rectal bleeding for the past few days. Digital rectal examination has revealed a mass and CT scan concured the same. Patient had colonoscopy and Dr. Gustavo Lah has requested hospitalist team to admit the patient for obstructing rectal mass.  #Obstructing rectal mass -Status post colonoscopy today by Dr. Gustavo Lah -surgery consult is placed to dr.Shankar for port placement which will be done as outpt on Friday -no colon surgery at present -Holding Plavix for now for port placement  #History of cardiac vascular disease ck -Currently patient is asymptomatic   s/p myoview. Results pending - patient stopped taking aspirin but has been taking Plavix which was held for colonoscopy and now cont to hold for port placement -Will continue her home medication Imdur, beta blocker, ACE inhibitor  #Essential hypertension Continue home medications Zestril, amlodipine (new)and Imdur d/ced BB due to bradycardia by  cardiology  #Recent history of stroke Currently Plavix is on hold as patient had colonoscopy  And now port placement on Friday   #tobacco abuse disorder Counseled patient to  quit smoking for 3-5 minutes   D/w pt and dters in the room D/w Dr B and Dr Jamal Collin Pt will d/c home  CONSULTS OBTAINED:  Treatment Team:  Nicholes Mango, MD [place], MD Wellington Hampshire, MD  DRUG ALLERGIES:   Allergies  Allergen Reactions  . Prednisone Other (See Comments)    BLISTERS IN MOUTH AND ON TONGUE    DISCHARGE MEDICATIONS:   Current Discharge Medication List    START taking these medications   Details  amLODipine (NORVASC) 10 MG tablet Take 1 tablet (10 mg total) by mouth every evening. Qty: 30 tablet, Refills: 0    oxyCODONE (OXY IR/ROXICODONE) 5 MG immediate release tablet Take 1 tablet (5 mg total) by mouth every 4 (four) hours as needed for moderate pain. Qty: 30 tablet, Refills: 0      CONTINUE these medications which have NOT CHANGED   Details  isosorbide mononitrate (IMDUR) 30 MG 24 hr tablet Take 1 tablet (30 mg total) by mouth daily. Qty: 90 tablet, Refills: 3    lisinopril (PRINIVIL,ZESTRIL) 10 MG tablet Take 10 mg by mouth daily.    shark liver oil-cocoa butter (PREPARATION H) 0.25-3-85.5 % suppository Place 1 suppository rectally daily as needed for hemorrhoids.    traMADol (ULTRAM) 50 MG tablet Take 50 mg by mouth every  6 (six) hours as needed for moderate pain.    nitroGLYCERIN (NITROSTAT) 0.4 MG SL tablet Place 1 tablet (0.4 mg total) under the tongue every 5 (five) minutes as needed for chest pain. Qty: 25 tablet, Refills: 6    omeprazole (PRILOSEC) 20 MG capsule Take 20 mg by mouth daily.      STOP taking these medications     metoprolol tartrate (LOPRESSOR) 25 MG tablet      clopidogrel (PLAVIX) 75 MG tablet         If you experience worsening of your admission symptoms, develop shortness of breath, life threatening emergency, suicidal or  homicidal thoughts you must seek medical attention immediately by calling 911 or calling your MD immediately  if symptoms less severe.  You Must read complete instructions/literature along with all the possible adverse reactions/side effects for all the Medicines you take and that have been prescribed to you. Take any new Medicines after you have completely understood and accept all the possible adverse reactions/side effects.   Please note  You were cared for by a hospitalist during your hospital stay. If you have any questions about your discharge medications or the care you received while you were in the hospital after you are discharged, you can call the unit and asked to speak with the hospitalist on call if the hospitalist that took care of you is not available. Once you are discharged, your primary care physician will handle any further medical issues. Please note that NO REFILLS for any discharge medications will be authorized once you are discharged, as it is imperative that you return to your primary care physician (or establish a relationship with a primary care physician if you do not have one) for your aftercare needs so that they can reassess your need for medications and monitor your lab values. Today   SUBJECTIVE   Doing ok. Wants to go home  VITAL SIGNS:  Blood pressure (!) 146/91, pulse 94, temperature 98 F (36.7 C), temperature source Oral, resp. rate 18, height 5\' 6"  (1.676 m), weight 64.8 kg (142 lb 12.8 oz), SpO2 97 %.  I/O:   Intake/Output Summary (Last 24 hours) at 08/16/16 1419 Last data filed at 08/15/16 1814  Gross per 24 hour  Intake                0 ml  Output                2 ml  Net               -2 ml    PHYSICAL EXAMINATION:  GENERAL:  60 y.o.-year-old patient lying in the bed with no acute distress.  EYES: Pupils equal, round, reactive to light and accommodation. No scleral icterus. Extraocular muscles intact.  HEENT: Head atraumatic, normocephalic.  Oropharynx and nasopharynx clear.  NECK:  Supple, no jugular venous distention. No thyroid enlargement, no tenderness.  LUNGS: Normal breath sounds bilaterally, no wheezing, rales,rhonchi or crepitation. No use of accessory muscles of respiration.  CARDIOVASCULAR: S1, S2 normal. No murmurs, rubs, or gallops.  ABDOMEN: Soft, non-tender, non-distended. Bowel sounds present. No organomegaly or mass.  EXTREMITIES: No pedal edema, cyanosis, or clubbing.  NEUROLOGIC: Cranial nerves II through XII are intact. Muscle strength 5/5 in all extremities. Sensation intact. Gait not checked.  PSYCHIATRIC: The patient is alert and oriented x 3.  SKIN: No obvious rash, lesion, or ulcer.   DATA REVIEW:   CBC   Recent Labs Lab 08/16/16 0444  WBC 6.3  HGB 13.6  HCT 38.9  PLT 132*    Chemistries   Recent Labs Lab 08/16/16 0444 08/16/16 1322  NA 142  --   K 2.9* 4.0  CL 108  --   CO2 29  --   GLUCOSE 78  --   BUN 11  --   CREATININE 0.52  --   CALCIUM 8.7*  --   MG  --  2.0    Microbiology Results   No results found for this or any previous visit (from the past 240 hour(s)).  RADIOLOGY:  No results found.   Management plans discussed with the patient, family and they are in agreement.  CODE STATUS:     Code Status Orders        Start     Ordered   08/15/16 1346  Full code  Continuous     08/15/16 1345    Code Status History    Date Active Date Inactive Code Status Order ID Comments User Context   07/17/2016  3:28 AM 07/17/2016  9:25 PM Full Code [id number]  Edwin Dada, MD Inpatient      TOTAL TIME TAKING CARE OF THIS PATIENT: 40 minutes.    Bennie Chirico M.D on 08/16/2016 at 2:19 PM  Between 7am to 6pm - Pager - [phone] After 6pm go to [email or web address] - password [email or web address] [facility]  Office  [phone]  CC: Primary care physician; Lynnell Jude, MD

## 2016-08-16 NOTE — Progress Notes (Signed)
[facility] CONSULT NOTE  Patient Care Team: Lynnell Jude, MD as PCP - General (Family Medicine)  CHIEF COMPLAINTS/PURPOSE OF CONSULTATION:  Rectal cancer  HISTORY OF PRESENTING ILLNESS:  Rachel [name] 60 y.o.  female long-standing history of smoking and recent diagnosis of TIA- noted to have rectal fullness constipation alternating with diarrhea for the last 2 months. She lost about 10 pounds the past few months. More recently she noted to have intermittent blood in stools.  She denies any abdominal pain denies any bone pain. Denies any unusual shortness of breath or cough. No chest pain. Denies any new onset of tingling and numbness.  Patient recently was diagnosed with TIA at [facility] started on Plavix.  ROS: A complete 10 point review of system is done which is negative except mentioned above in history of present illness  MEDICAL HISTORY:  Past Medical History:  Diagnosis Date  . Body mass index (BMI) of 24.0-24.9 in adult   . Cardiovascular disease   . Current every day smoker    a. ~35 years - > has cut down to < 3/4 ppd  . Headache   . History of UTI   . Hypertension   . Midsternal chest pain    a. 05/2014 Stress Echo: Ex time: 8:09, ECG w/o acute changes, EF nl with possible mid and apical anterior HK-->cath recommended.  . Stroke ([place])   . Syncope and collapse     SURGICAL HISTORY: Past Surgical History:  Procedure Laterality Date  . ABDOMINAL HYSTERECTOMY    . BACK SURGERY    . CHOLECYSTECTOMY    . ERCP    . PARTIAL HYSTERECTOMY      SOCIAL HISTORY:Positive for smoking. No alcohol abuse. Patient lives in [place]. She works in the [employer]  . Marital status: Married    Spouse name: N/A  . Number of children: N/A  . Years of education: N/A   Occupational History  . Not on file.   Social History Main Topics  . Smoking status: Current Every Day Smoker    Packs/day: 0.25    Years: 35.00    Types:  Cigarettes  . Smokeless tobacco: Never Used  . Alcohol use No     Comment: occasional  . Drug use: No  . Sexual activity: Not on file   Other Topics Concern  . Not on file   Social History Narrative   Married.   Works for [employer].   Smokes ~3/4 ppd    FAMILY HISTORY: Family History  Problem Relation Age of Onset  . Breast cancer Mother   . Hyperlipidemia Father   . Heart disease Paternal Grandmother   . Stroke Maternal Grandfather     ALLERGIES:  is allergic to prednisone.  MEDICATIONS:  Current Facility-Administered Medications  Medication Dose Route Frequency Provider Last Rate Last Dose  . amLODipine (NORVASC) tablet 10 mg  10 mg Oral QPM Minna Merritts, MD   10 mg at 08/15/16 2230  . hydrALAZINE (APRESOLINE) injection 5 mg  5 mg Intravenous Q6H PRN Nicholes Mango, MD      . isosorbide mononitrate (IMDUR) 24 hr tablet 30 mg  30 mg Oral Daily Nicholes Mango, MD   30 mg at 08/15/16 1540  . lisinopril (PRINIVIL,ZESTRIL) tablet 10 mg  10 mg Oral Daily Nicholes Mango, MD   10 mg at 08/15/16 1540  . nicotine (NICODERM CQ - dosed in mg/24 hours) patch 14 mg  14 mg Transdermal Daily Nicholes Mango, MD   14 mg at 08/15/16 1541  . nitroGLYCERIN (NITROSTAT) SL tablet 0.4 mg  0.4 mg Sublingual Q5 min PRN Nicholes Mango, MD      . oxyCODONE (Oxy IR/ROXICODONE) immediate release tablet 5 mg  5 mg Oral Q4H PRN Nicholes Mango, MD      . pantoprazole (PROTONIX) EC tablet 40 mg  40 mg Oral Daily Nicholes Mango, MD   40 mg at 08/15/16 1540  . traMADol (ULTRAM) tablet 50 mg  50 mg Oral Q6H PRN Nicholes Mango, MD   50 mg at 08/15/16 2230      .  PHYSICAL EXAMINATION:  Vitals:   08/15/16 2042 08/16/16 0415  BP: 137/75 128/71  Pulse: (!) 48 (!) 55  Resp: (!) 22 (!) 21  Temp: 98.2 F (36.8 C) 98.1 F (36.7 C)   Filed Weights   08/15/16 1013 08/15/16 1348  Weight: 143 lb (64.9 kg) 142 lb 12.8 oz (64.8 kg)    GENERAL: Well-nourished well-developed; Alert, no distress and comfortable. With  family EYES: no pallor or icterus OROPHARYNX: no thrush or ulceration. NECK: supple, no masses felt LYMPH:  no palpable lymphadenopathy in the cervical, axillary or inguinal regions LUNGS: decreased breath sounds to auscultation at bases and  No wheeze or crackles HEART/CVS: regular rate & rhythm and no murmurs; No lower extremity edema ABDOMEN: abdomen soft, non-tender and normal bowel sounds; rectal exam- deffered.  Musculoskeletal:no cyanosis of digits and no clubbing  PSYCH: alert & oriented x 3 with fluent speech NEURO: no focal motor/sensory deficits SKIN:  no rashes or significant lesions  LABORATORY DATA:  I have reviewed the data as listed Lab Results  Component Value Date   WBC 6.3 08/16/2016   HGB 13.6 08/16/2016   HCT 38.9 08/16/2016   MCV 87.3 08/16/2016   PLT 132 (L) 08/16/2016    Recent Labs  07/17/16 0039 07/17/16 0044 07/17/16 0534 08/16/16 0444  NA 135 140 140 142  K 2.9* 3.0* 3.4* 2.9*  CL 105 102 105 108  CO2 26  --  27 29  GLUCOSE 128* 128* 118* 78  BUN 11 13 8 11   CREATININE 0.60 0.60 0.65 0.52  CALCIUM 8.7*  --  9.1 8.7*  GFRNONAA >60  --  >60 >60  GFRAA >60  --  >60 >60  PROT 6.5  --   --   --   ALBUMIN 3.5  --   --   --   AST 19  --   --   --   ALT 11*  --   --   --   ALKPHOS 102  --   --   --   BILITOT 0.4  --   --   --     RADIOGRAPHIC STUDIES: I have personally reviewed the radiological images as listed and agreed with the findings in the report. Ct Abdomen Pelvis W Contrast  Result Date: 08/09/2016 CLINICAL DATA:  Worsening lower abdominal pain. Bright red blood per rectum. " "Rectal mass." Prior hysterectomy and cholecystectomy. EXAM: CT ABDOMEN AND PELVIS WITH CONTRAST TECHNIQUE: Multidetector CT imaging of the abdomen and pelvis was performed using the standard protocol following bolus administration of intravenous contrast. CONTRAST:  132mL ISOVUE-300 IOPAMIDOL (ISOVUE-300) INJECTION 61% COMPARISON:  09/15/2014 and 07/22/2014 .  FINDINGS: Lower chest: right middle lobe bronchiectasis and mucous plugging are mild and similar. More subtle findings also identified within the lingula. Borderline cardiomegaly, without pericardial or pleural effusion Hepatobiliary: No focal  liver lesion. Pneumobilia. Cholecystectomy, without biliary ductal dilatation. Pancreas: Normal, without mass or ductal dilatation. Spleen:  Normal in size, without focal abnormality. Adrenals/Urinary Tract: Bilateral adrenal thickening, without well-defined dominant lesion. This similar. An interpolar left renal hypo attenuating lesion is favored to represent a cyst or minimally complex cyst. This is unchanged in size back to 8/12/ 2015, 11 mm. Normal right kidney, without hydronephrosis. Normal urinary bladder. Stomach/Bowel: Normal stomach, without wall thickening. Rectal mass, as evidenced by irregular asymmetric wall thickening including on images 64-67/series 2. Probable direct tumor extension into the mesorectum (versus a contiguous necrotic node). This measures 2.3 x 2.0 cm at the 8 o'clock position on image 63/series 2. No high-grade obstruction is seen. There is large colonic stool burden throughout the more proximal colon. Normal terminal ileum and appendix. Normal small bowel. Vascular/Lymphatic: Aortic and branch vessel atherosclerosis. No retroperitoneal or retrocrural adenopathy. No pelvic sidewall adenopathy. A node in the sigmoid mesocolon measures 6 mm on image 56/series 2, upper normal. Right-sided mesorectal nodes measure up to 8 mm on image 63/series 2, suspicious Reproductive: Hysterectomy.  No adnexal mass. Other: No significant free fluid. No evidence of omental or peritoneal disease. 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 IMPRESSION: 1. Locally advanced rectal carcinoma. Probable direct extension into the mesorectum with suspicious perirectal and sigmoid meso colon nodes. 2. No evidence of distant metastasis. 3. No  high-grade obstruction. There is proximal large volume colonic stool, suggesting a component of constipation. 4. Right middle lobe and lingular findings which are likely related to chronic atypical infection and are similar to 2015. Electronically Signed   By: Abigail Miyamoto M.D.   On: 08/09/2016 15:54    ASSESSMENT & PLAN:   60 yo with newly diagnosed rectal cancer  # Rectal cancer- likely stage III based upon lymphadenopathy based on the CT scan. Recommend a PET scan for further evaluation. In general I would recommend- neoadjuvant chemoradiation therapy 5-FU pump along with radiation. Patient has radiation oncology consultation on 9/11.   # I discussed the potential side effects of chemoradiation in detail. Recommend chemotherapy education.  # Patient is awaiting a port on September 8. Discussed with Dr. Jamal Collin.  # The above plan of care was discussed with the patient and family in detail. All questions were answered.  # Patient will follow-up with me on September 13 today with above workup; will plan starting chemo the week of 18th.   All questions were answered. The patient knows to call the clinic with any problems, questions or concerns.  Thank you Dr. Jamal Collin for allowing me to participate in the care of your pleasant patient. Please do not hesitate to contact me with questions or concerns in the interim.      Cammie Sickle, MD 08/16/2016 7:39 AM

## 2016-08-16 NOTE — Progress Notes (Addendum)
Dr. Anselm Jungling paged to be notified potassium 2.9 this morning. Patient is also NPO for tests today but only has PO pain medication for c/o rectal and abdominal pain.   Awaiting response.   07:50 am Re-paged   08:15 am - Dr. Fritzi Mandes is now attending, aware of potassium 2.9. See new orders.

## 2016-08-16 NOTE — Telephone Encounter (Deleted)
FYI- Spoke to ID Dr. Ola Spurr- ciprofloxacin 500 mg twice a day for 5 days. Spoke to the patient diarrhea better no fevers at this time recommend finishing off the antibiotic course.sent to  Pharmacy.

## 2016-08-16 NOTE — Consult Note (Addendum)
Patient not in room on rounds. Preliminary result of biopsies on colonoscopy  from yesterday indicates rectal mass, invasive adenocarcinoma, moderately differentiated. Further recommendations per oncology and surgery. No new recommendations from GI. We'll sign off. I will not be available until Monday.

## 2016-08-17 LAB — CEA: CEA: 14.4 ng/mL — ABNORMAL HIGH (ref 0.0–4.7)

## 2016-08-17 NOTE — Telephone Encounter (Signed)
Telephone note entered in error per Dr. Jacinto Reap.

## 2016-08-17 NOTE — Progress Notes (Signed)
msg sent to cancer center scheduling to arrange for this pet scan appointment

## 2016-08-18 ENCOUNTER — Other Ambulatory Visit: Payer: Self-pay

## 2016-08-18 ENCOUNTER — Ambulatory Visit: Payer: BLUE CROSS/BLUE SHIELD | Admitting: Anesthesiology

## 2016-08-18 ENCOUNTER — Ambulatory Visit: Payer: BLUE CROSS/BLUE SHIELD

## 2016-08-18 ENCOUNTER — Encounter
Admission: RE | Disposition: A | Discharge status: Home / Self Care | Payer: Self-pay | Source: Ambulatory Visit | Attending: General Surgery

## 2016-08-18 ENCOUNTER — Encounter: Payer: Self-pay | Admitting: *Deleted

## 2016-08-18 ENCOUNTER — Ambulatory Visit
Admission: RE | Admit: 2016-08-18 | Discharge: 2016-08-18 | Disposition: A | Discharge status: Home / Self Care | Payer: BLUE CROSS/BLUE SHIELD | Source: Ambulatory Visit | Attending: General Surgery | Admitting: General Surgery

## 2016-08-18 DIAGNOSIS — Z8249 Family history of ischemic heart disease and other diseases of the circulatory system: Secondary | ICD-10-CM | POA: Insufficient documentation

## 2016-08-18 DIAGNOSIS — I2510 Atherosclerotic heart disease of native coronary artery without angina pectoris: Secondary | ICD-10-CM | POA: Insufficient documentation

## 2016-08-18 DIAGNOSIS — Z803 Family history of malignant neoplasm of breast: Secondary | ICD-10-CM | POA: Diagnosis not present

## 2016-08-18 DIAGNOSIS — F17210 Nicotine dependence, cigarettes, uncomplicated: Secondary | ICD-10-CM | POA: Diagnosis not present

## 2016-08-18 DIAGNOSIS — Z823 Family history of stroke: Secondary | ICD-10-CM | POA: Insufficient documentation

## 2016-08-18 DIAGNOSIS — Z888 Allergy status to other drugs, medicaments and biological substances status: Secondary | ICD-10-CM | POA: Diagnosis not present

## 2016-08-18 DIAGNOSIS — C20 Malignant neoplasm of rectum: Secondary | ICD-10-CM

## 2016-08-18 DIAGNOSIS — Z95828 Presence of other vascular implants and grafts: Secondary | ICD-10-CM

## 2016-08-18 DIAGNOSIS — K6289 Other specified diseases of anus and rectum: Secondary | ICD-10-CM

## 2016-08-18 DIAGNOSIS — I10 Essential (primary) hypertension: Secondary | ICD-10-CM | POA: Diagnosis not present

## 2016-08-18 DIAGNOSIS — Z8673 Personal history of transient ischemic attack (TIA), and cerebral infarction without residual deficits: Secondary | ICD-10-CM | POA: Diagnosis not present

## 2016-08-18 DIAGNOSIS — Z90710 Acquired absence of both cervix and uterus: Secondary | ICD-10-CM | POA: Insufficient documentation

## 2016-08-18 HISTORY — PX: PORTACATH PLACEMENT: SHX2246

## 2016-08-18 SURGERY — INSERTION, TUNNELED CENTRAL VENOUS DEVICE, WITH PORT
Anesthesia: General

## 2016-08-18 MED ORDER — OXYCODONE HCL 5 MG/5ML PO SOLN: 5.0000 mg | Freq: Once | ORAL | Status: AC | PRN

## 2016-08-18 MED ORDER — FENTANYL CITRATE (PF) 100 MCG/2ML IJ SOLN
INTRAMUSCULAR | Status: DC | PRN
  Administered 2016-08-18 (×2): 50 ug via INTRAVENOUS

## 2016-08-18 MED ORDER — LACTATED RINGERS IV SOLN
INTRAVENOUS | Status: DC
  Administered 2016-08-18: 11:00:00 via INTRAVENOUS

## 2016-08-18 MED ORDER — DEXAMETHASONE SODIUM PHOSPHATE 10 MG/ML IJ SOLN
INTRAMUSCULAR | Status: DC | PRN
  Administered 2016-08-18: 4 mg via INTRAVENOUS

## 2016-08-18 MED ORDER — ONDANSETRON HCL 4 MG/2ML IJ SOLN
INTRAMUSCULAR | Status: DC | PRN
  Administered 2016-08-18: 4 mg via INTRAVENOUS

## 2016-08-18 MED ORDER — PROPOFOL 10 MG/ML IV BOLUS
INTRAVENOUS | Status: DC | PRN
  Administered 2016-08-18: 40 mg via INTRAVENOUS

## 2016-08-18 MED ORDER — LIDOCAINE HCL 1 % IJ SOLN
INTRAMUSCULAR | Status: DC | PRN
  Administered 2016-08-18: 17 mL

## 2016-08-18 MED ORDER — FENTANYL CITRATE (PF) 100 MCG/2ML IJ SOLN: 25.0000 ug | INTRAMUSCULAR | Status: DC | PRN

## 2016-08-18 MED ORDER — BUPIVACAINE HCL (PF) 0.5 % IJ SOLN
INTRAMUSCULAR | Status: AC
  Filled 2016-08-18: qty 30

## 2016-08-18 MED ORDER — ACETAMINOPHEN 10 MG/ML IV SOLN
INTRAVENOUS | Status: DC | PRN
  Administered 2016-08-18: 1000 mg via INTRAVENOUS

## 2016-08-18 MED ORDER — OXYCODONE HCL 5 MG PO TABS
ORAL_TABLET | ORAL | Status: AC
  Filled 2016-08-18: qty 1

## 2016-08-18 MED ORDER — CEFAZOLIN SODIUM-DEXTROSE 2-4 GM/100ML-% IV SOLN
INTRAVENOUS | Status: AC
  Administered 2016-08-18: 2 g via INTRAVENOUS
  Filled 2016-08-18: qty 100

## 2016-08-18 MED ORDER — PROPOFOL 500 MG/50ML IV EMUL
INTRAVENOUS | Status: DC | PRN
  Administered 2016-08-18: 120 ug/kg/min via INTRAVENOUS

## 2016-08-18 MED ORDER — HEPARIN SODIUM (PORCINE) 5000 UNIT/ML IJ SOLN
INTRAMUSCULAR | Status: AC
  Filled 2016-08-18: qty 1

## 2016-08-18 MED ORDER — SODIUM CHLORIDE 0.9 % IV SOLN
INTRAVENOUS | Status: DC | PRN
  Administered 2016-08-18: 11 mL via INTRAMUSCULAR

## 2016-08-18 MED ORDER — LIDOCAINE HCL (PF) 1 % IJ SOLN
INTRAMUSCULAR | Status: AC
  Filled 2016-08-18: qty 30

## 2016-08-18 MED ORDER — CHLORHEXIDINE GLUCONATE CLOTH 2 % EX PADS
6.0000 | MEDICATED_PAD | Freq: Once | CUTANEOUS | Status: DC
Start: 2016-08-18 — End: 2016-08-18

## 2016-08-18 MED ORDER — OXYCODONE HCL 5 MG PO TABS
5.0000 mg | ORAL_TABLET | Freq: Once | ORAL | Status: AC | PRN
  Administered 2016-08-18: 5 mg via ORAL

## 2016-08-18 MED ORDER — ACETAMINOPHEN 10 MG/ML IV SOLN
INTRAVENOUS | Status: AC
  Filled 2016-08-18: qty 100

## 2016-08-18 MED ORDER — CEFAZOLIN SODIUM-DEXTROSE 2-4 GM/100ML-% IV SOLN
2.0000 g | INTRAVENOUS | Status: AC
  Administered 2016-08-18: 2 g via INTRAVENOUS

## 2016-08-18 MED ORDER — LIDOCAINE HCL (CARDIAC) 20 MG/ML IV SOLN
INTRAVENOUS | Status: DC | PRN
  Administered 2016-08-18: 16 mg via INTRAVENOUS

## 2016-08-18 SURGICAL SUPPLY — 31 items
BAG DECANTER FOR FLEXI CONT (MISCELLANEOUS) ×3 IMPLANT
BLADE SURG 15 STRL SS SAFETY (BLADE) ×3 IMPLANT
CANISTER SUCT 1200ML W/VALVE (MISCELLANEOUS) ×3 IMPLANT
CHLORAPREP W/TINT 26ML (MISCELLANEOUS) ×3 IMPLANT
COVER LIGHT HANDLE STERIS (MISCELLANEOUS) ×6 IMPLANT
COVER PROBE FLX POLY STRL (MISCELLANEOUS) ×2 IMPLANT
DECANTER SPIKE VIAL GLASS SM (MISCELLANEOUS) ×6 IMPLANT
DRAPE C-ARM XRAY 36X54 (DRAPES) ×3 IMPLANT
ELECT REM PT RETURN 9FT ADLT (ELECTROSURGICAL) ×3
ELECTRODE REM PT RTRN 9FT ADLT (ELECTROSURGICAL) ×1 IMPLANT
GLOVE BIO SURGEON STRL SZ7 (GLOVE) ×7 IMPLANT
GOWN STRL REUS W/ TWL LRG LVL3 (GOWN DISPOSABLE) ×2 IMPLANT
GOWN STRL REUS W/TWL LRG LVL3 (GOWN DISPOSABLE) ×6
IV NS 500ML (IV SOLUTION) ×3
IV NS 500ML BAXH (IV SOLUTION) ×1 IMPLANT
KIT PORT POWER 8FR ISP CVUE (Catheter) ×3 IMPLANT
KIT RM TURNOVER STRD PROC AR (KITS) ×3 IMPLANT
LABEL OR SOLS (LABEL) ×3 IMPLANT
LIQUID BAND (GAUZE/BANDAGES/DRESSINGS) ×3 IMPLANT
NDL FILTER BLUNT 18X1 1/2 (NEEDLE) ×1 IMPLANT
NDL HYPO 25GX1X1/2 BEV (NEEDLE) ×1 IMPLANT
NEEDLE FILTER BLUNT 18X 1/2SAF (NEEDLE) ×2
NEEDLE FILTER BLUNT 18X1 1/2 (NEEDLE) ×1 IMPLANT
NEEDLE HYPO 25GX1X1/2 BEV (NEEDLE) ×3 IMPLANT
NS IRRIG 500ML POUR BTL (IV SOLUTION) ×3 IMPLANT
PACK PORT-A-CATH (MISCELLANEOUS) ×3 IMPLANT
SUT PROLENE 2 0 SH DA (SUTURE) ×3 IMPLANT
SUT VIC AB 3-0 SH 27 (SUTURE) ×3
SUT VIC AB 3-0 SH 27X BRD (SUTURE) ×1 IMPLANT
SUT VIC AB 4-0 FS2 27 (SUTURE) ×3 IMPLANT
SYR 3ML LL SCALE MARK (SYRINGE) ×3 IMPLANT

## 2016-08-18 NOTE — Interval H&P Note (Signed)
History and Physical Interval Note:  08/18/2016 11:00 AM  Rachel [name]  has presented today for surgery, with the diagnosis of CANCER RECTUM  The various methods of treatment have been discussed with the patient and family. After consideration of risks, benefits and other options for treatment, the patient has consented to  Procedure(s): INSERTION PORT-A-CATH (N/A) as a surgical intervention .  The patient's history has been reviewed, patient examined, no change in status, stable for surgery.  I have reviewed the patient's chart and labs.  Questions were answered to the patient's satisfaction.     Edem Tiegs G

## 2016-08-18 NOTE — Progress Notes (Signed)
Pt had crackers and drink in PACU

## 2016-08-18 NOTE — Anesthesia Preprocedure Evaluation (Addendum)
Anesthesia Evaluation  Patient identified by MRN, date of birth, ID band Patient awake    Reviewed: Allergy & Precautions, H&P , NPO status , Patient's Chart, lab work & pertinent test results, reviewed documented beta blocker date and time   History of Anesthesia Complications Negative for: history of anesthetic complications  Airway Mallampati: III  TM Distance: <3 FB Neck ROM: limited    Dental  (+) Upper Dentures, Lower Dentures, Poor Dentition, Missing   Pulmonary neg sleep apnea, neg COPD, Current Smoker,    breath sounds clear to auscultation- rhonchi (-) wheezing      Cardiovascular Exercise Tolerance: Good hypertension, Pt. on medications and Pt. on home beta blockers (-) angina+ CAD, + Past MI and + Peripheral Vascular Disease  (-) DOE  Rhythm:Regular Rate:Normal - Systolic murmurs and - Diastolic murmurs    Neuro/Psych  Headaches, TIAnegative psych ROS   GI/Hepatic negative GI ROS, Neg liver ROS,   Endo/Other  negative endocrine ROSneg diabetes  Renal/GU negative Renal ROS     Musculoskeletal   Abdominal (+) - obese,   Peds  Hematology negative hematology ROS (+)   Anesthesia Other Findings Past Medical History: No date: Body mass index (BMI) of 24.0-24.9 in adult No date: Cardiovascular disease No date: Current every day smoker     Comment: a. ~35 years - > has cut down to < 3/4 ppd No date: Headache No date: History of UTI No date: Hypertension No date: Midsternal chest pain     Comment: a. 05/2014 Stress Echo: Ex time: 8:09, ECG w/o               acute changes, EF nl with possible mid and               apical anterior HK-->cath recommended. No date: Stroke [facility]) No date: Syncope and collapse   Reproductive/Obstetrics                            Anesthesia Physical  Anesthesia Plan  ASA: III  Anesthesia Plan: General   Post-op Pain Management:    Induction:  Intravenous  Airway Management Planned: Natural Airway  Additional Equipment:   Intra-op Plan:   Post-operative Plan:   Informed Consent: I have reviewed the patients History and Physical, chart, labs and discussed the procedure including the risks, benefits and alternatives for the proposed anesthesia with the patient or authorized representative who has indicated his/her understanding and acceptance.   Dental advisory given  Plan Discussed with: CRNA and Anesthesiologist  Anesthesia Plan Comments: (Patient informed that they are higher risk for complications from anesthesia during this procedure due to their medical history 2/2 to her stroke history.  Patient has medical clearance for this procedure to be off anticoags.  Patient voiced understanding. )       Anesthesia Quick Evaluation

## 2016-08-18 NOTE — Op Note (Signed)
Preop diagnosis: Carcinoma of the rectum  Post op diagnosis: Same  Operation: Insertion of venous access port with the ultrasound and fluoroscopic guidance  Surgeon: Mckinley Jewel  Assistant:     Anesthesia: Monitored care with local anesthetic containing 0.5% Marcaine and 1% Xylocaine total 17 mL  Complications: None  EBL: Less than 5 mL  Drains: None  Description: This patient was placed supine and then in Trendelenburg position with adequate sedation and monitoring. The right upper chest and neck area were prepped and draped sterile field and timeout performed. Ultrasound probe was brought up to the field in the subclavian vein was identified beneath the lateral end of the clavicle. After local anesthetic was instilled a small 1 cm incision was made and a needle was successfully positioned in the subclavian vein with free withdrawal of blood. Following this the Seldinger technique was used to place a catheter going towards the distal SVC. Skin mark was approximately 20 cm. Local anesthetic was instilled over the second costal cartilage and interspace area and incision was made at the site. Subcutaneous days pocket was created with cautery and the catheter was tunneled through this site and cut to approximate length.  The catheter was fixed to the prefilled port.The port was then placed in the pocket and anchored to the underlying fascia with 3 stitches of 2-0 Prolene port was then flushed through with heparinized saline. Fluoroscopy was repeated insure that the catheter course was without any kinks and the end was noted distal SVC. Subcutaneous tissue closed with 3-0 Vicryl and the skin with subcuticular 4-0 Vicryl covered with Dermabond. Patient subsequently returned recovery room stable condition.

## 2016-08-18 NOTE — Anesthesia Postprocedure Evaluation (Signed)
Anesthesia Post Note  Patient: Rachel [name]  Procedure(s) Performed: Procedure(s) (LRB): INSERTION PORT-A-CATH (N/A)  Anesthesia Post Evaluation  Last Vitals:  Vitals:   08/18/16 1009 08/18/16 1235  BP: (!) 142/90 126/73  Pulse: 67 (!) 55  Resp: 18 12  Temp: (!) 36 C 36.4 C    Last Pain:  Vitals:   08/18/16 1235  TempSrc:   PainSc: (P) 0-No pain                 Darlyne Russian

## 2016-08-18 NOTE — Discharge Instructions (Signed)
AMBULATORY SURGERY  °DISCHARGE INSTRUCTIONS ° ° °1) The drugs that you were given will stay in your system until tomorrow so for the next 24 hours you should not: ° °A) Drive an automobile °B) Make any legal decisions °C) Drink any alcoholic beverage ° ° °2) You may resume regular meals tomorrow.  Today it is better to start with liquids and gradually work up to solid foods. ° °You may eat anything you prefer, but it is better to start with liquids, then soup and crackers, and gradually work up to solid foods. ° ° °3) Please notify your doctor immediately if you have any unusual bleeding, trouble breathing, redness and pain at the surgery site, drainage, fever, or pain not relieved by medication. ° ° ° °4) Additional Instructions: ° ° ° ° ° ° ° °Please contact your physician with any problems or Same Day Surgery at [phone], Monday through Friday 6 am to 4 pm, or [place] at [place] Main number at [phone].AMBULATORY SURGERY  °DISCHARGE INSTRUCTIONS ° ° °5) The drugs that you were given will stay in your system until tomorrow so for the next 24 hours you should not: ° °D) Drive an automobile °E) Make any legal decisions °F) Drink any alcoholic beverage ° ° °6) You may resume regular meals tomorrow.  Today it is better to start with liquids and gradually work up to solid foods. ° °You may eat anything you prefer, but it is better to start with liquids, then soup and crackers, and gradually work up to solid foods. ° ° °7) Please notify your doctor immediately if you have any unusual bleeding, trouble breathing, redness and pain at the surgery site, drainage, fever, or pain not relieved by medication. ° ° ° °8) Additional Instructions: ° ° ° ° ° ° ° °Please contact your physician with any problems or Same Day Surgery at [phone], Monday through Friday 6 am to 4 pm, or [place] at [place] Main number at [phone]. °

## 2016-08-18 NOTE — H&P (View-Only) (Signed)
Reason for Consult rectal mass Referring Physician: [name] is an 60 y.o. female.  HPI: This is an 60 year old female who underwent colonoscopy this morning she was found to have a large rectal mass consistent with a carcinoma. Scope could not be passed past this lesion. Multiple biopsies were obtained. This patient has had several months to few months history of foot change in bowel habits and some rectal bleeding off and on which has become more prominent of recent. She also complained of some vague lower abdominal pain and low back pain. She has noticed that she is a little more tired in the last few weeks and may have lost about 203 pounds and that period of time. She denies any nausea or vomiting or abdominal distention. Otherwise she is continuing to work and feels okay. She has not had a previous colonoscopy. Approximately a month or so ago the patient had an episode of significant dizziness and vertigo-like symptoms. She was evaluated by a stroke team and concluded that this was an episode of TIA and the patient has been placed on Plavix. Patient had a CT scan of the abdomen and pelvis done last week which showed a large rectal mass with the evidence of focal extension in the perirectal area. No metastatic disease within the abdomen was noted.  Past Medical History:  Diagnosis Date  . Body mass index (BMI) of 24.0-24.9 in adult   . Cardiovascular disease   . Current every day smoker    a. ~35 years - > has cut down to < 3/4 ppd  . Headache   . History of UTI   . Hypertension   . Midsternal chest pain    a. 05/2014 Stress Echo: Ex time: 8:09, ECG w/o acute changes, EF nl with possible mid and apical anterior HK-->cath recommended.  . Stroke ([place])   . Syncope and collapse     Past Surgical History:  Procedure Laterality Date  . ABDOMINAL HYSTERECTOMY    . BACK SURGERY    . CHOLECYSTECTOMY    . ERCP    . PARTIAL HYSTERECTOMY      Family History  Problem Relation  Age of Onset  . Breast cancer Mother   . Hyperlipidemia Father   . Heart disease Paternal Grandmother   . Stroke Maternal Grandfather     Social History:  reports that she has been smoking Cigarettes.  She has a 8.75 pack-year smoking history. She has never used smokeless tobacco. She reports that she does not drink alcohol or use drugs.  Allergies:  Allergies  Allergen Reactions  . Prednisone Other (See Comments)    BLISTERS IN MOUTH AND ON TONGUE    Medications: I have reviewed the patient's current medications.  No results found for this or any previous visit (from the past 48 hour(s)).  No results found.  Review of Systems  Constitutional: Positive for malaise/fatigue and weight loss.  Respiratory: Negative.   Cardiovascular: Negative.   Gastrointestinal: Positive for abdominal pain and blood in stool. Negative for heartburn, nausea and vomiting.  Genitourinary: Negative.   Neurological: Positive for dizziness.   Blood pressure (!) 158/72, pulse (!) 49, temperature 97.9 F (36.6 C), temperature source Oral, resp. rate 20, height 5\' 6"  (1.676 m), weight 142 lb 12.8 oz (64.8 kg), SpO2 99 %. Physical Exam  Constitutional: She is oriented to person, place, and time. She appears well-developed and well-nourished.  HENT:  Head: Normocephalic.  Eyes: Conjunctivae are normal. No scleral icterus.  Neck: Neck supple. No thyromegaly present.  Cardiovascular: Normal rate, regular rhythm and normal heart sounds.   Respiratory: Effort normal and breath sounds normal.  GI: Soft. Bowel sounds are normal. She exhibits no distension. There is no hepatomegaly. There is no tenderness.  Lymphadenopathy:    She has no cervical adenopathy.    She has no axillary adenopathy.       Right: No inguinal adenopathy present.       Left: No inguinal adenopathy present.  Neurological: She is alert and oriented to person, place, and time.  Skin: Skin is warm and dry.  Digital rectal exam shows the  patient has a hard circumferential mass approximately 5-6 cm from the anal verge  Assessment/Plan: The patient had normal liver function was approximately a month ago. She has not had a CEA done. CT scan was reviewed. 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 Given the local extension of the large size of the tumor she will be best served by chemoradiation followed by more than likely abdominoperineal resection. At present she does not have any antegrade obstruction. CT scan and abdominal exam does not reveal any significant obstruction that would warrant a colostomy at this time. Situation was discussed in full with the patient and with Dr. Margaretmary Eddy and also requested consult from Dr. Rogue Bussing from oncology service. Hope to have a confirmation of diagnosis from pathology tomorrow. CEA has been ordered. Will follow. Thank you for allowing me to evaluate and help in the care of this patient.  Larrisa Cravey G 08/15/2016, 4:31 PM

## 2016-08-18 NOTE — Transfer of Care (Signed)
Immediate Anesthesia Transfer of Care Note  Patient: Rachel [name]  Procedure(s) Performed: Procedure(s): INSERTION PORT-A-CATH (N/A)  Patient Location: PACU  Anesthesia Type:General  Level of Consciousness: awake, alert  and oriented  Airway & Oxygen Therapy: Patient Spontanous Breathing and Patient connected to nasal cannula oxygen  Post-op Assessment: Report given to RN and Post -op Vital signs reviewed and stable  Post vital signs: Reviewed and stable  Last Vitals:  Vitals:   08/18/16 1009  BP: (!) 142/90  Pulse: 67  Resp: 18  Temp: (!) 36 C    Last Pain:  Vitals:   08/18/16 1009  TempSrc: Tympanic  PainSc: 6       Patients Stated Pain Goal: 2 ([id number])  Complications: No apparent anesthesia complications

## 2016-08-21 ENCOUNTER — Ambulatory Visit
Admission: RE | Admit: 2016-08-21 | Discharge: 2016-08-21 | Disposition: A | Discharge status: Home / Self Care | Payer: BLUE CROSS/BLUE SHIELD | Source: Ambulatory Visit | Attending: Radiation Oncology | Admitting: Radiation Oncology

## 2016-08-21 ENCOUNTER — Encounter: Payer: Self-pay | Admitting: General Surgery

## 2016-08-21 VITALS — BP 149/98 | HR 80 | Temp 97.3°F | Resp 20 | Wt 142.0 lb

## 2016-08-21 DIAGNOSIS — Z510 Encounter for antineoplastic radiation therapy: Secondary | ICD-10-CM | POA: Insufficient documentation

## 2016-08-21 DIAGNOSIS — C20 Malignant neoplasm of rectum: Secondary | ICD-10-CM | POA: Insufficient documentation

## 2016-08-21 NOTE — Patient Instructions (Signed)
Fluorouracil, 5-FU injection What is this medicine? FLUOROURACIL, 5-FU (flure oh YOOR a sil) is a chemotherapy drug. It slows the growth of cancer cells. This medicine is used to treat many types of cancer like breast cancer, colon or rectal cancer, pancreatic cancer, and stomach cancer. This medicine may be used for other purposes; ask your health care provider or pharmacist if you have questions. What should I tell my health care provider before I take this medicine? They need to know if you have any of these conditions: -blood disorders -dihydropyrimidine dehydrogenase (DPD) deficiency -infection (especially a virus infection such as chickenpox, cold sores, or herpes) -kidney disease -liver disease -malnourished, poor nutrition -recent or ongoing radiation therapy -an unusual or allergic reaction to fluorouracil, other chemotherapy, other medicines, foods, dyes, or preservatives -pregnant or trying to get pregnant -breast-feeding How should I use this medicine? This drug is given as an infusion or injection into a vein. It is administered in a hospital or clinic by a specially trained health care professional. Talk to your pediatrician regarding the use of this medicine in children. Special care may be needed. Overdosage: If you think you have taken too much of this medicine contact a poison control center or emergency room at once. NOTE: This medicine is only for you. Do not share this medicine with others. What if I miss a dose? It is important not to miss your dose. Call your doctor or health care professional if you are unable to keep an appointment. What may interact with this medicine? -allopurinol -cimetidine -dapsone -digoxin -hydroxyurea -leucovorin -levamisole -medicines for seizures like ethotoin, fosphenytoin, phenytoin -medicines to increase blood counts like filgrastim, pegfilgrastim, sargramostim -medicines that treat or prevent blood clots like warfarin,  enoxaparin, and dalteparin -methotrexate -metronidazole -pyrimethamine -some other chemotherapy drugs like busulfan, cisplatin, estramustine, vinblastine -trimethoprim -trimetrexate -vaccines Talk to your doctor or health care professional before taking any of these medicines: -acetaminophen -aspirin -ibuprofen -ketoprofen -naproxen This list may not describe all possible interactions. Give your health care provider a list of all the medicines, herbs, non-prescription drugs, or dietary supplements you use. Also tell them if you smoke, drink alcohol, or use illegal drugs. Some items may interact with your medicine. What should I watch for while using this medicine? Visit your doctor for checks on your progress. This drug may make you feel generally unwell. This is not uncommon, as chemotherapy can affect healthy cells as well as cancer cells. Report any side effects. Continue your course of treatment even though you feel ill unless your doctor tells you to stop. In some cases, you may be given additional medicines to help with side effects. Follow all directions for their use. Call your doctor or health care professional for advice if you get a fever, chills or sore throat, or other symptoms of a cold or flu. Do not treat yourself. This drug decreases your body's ability to fight infections. Try to avoid being around people who are sick. This medicine may increase your risk to bruise or bleed. Call your doctor or health care professional if you notice any unusual bleeding. Be careful brushing and flossing your teeth or using a toothpick because you may get an infection or bleed more easily. If you have any dental work done, tell your dentist you are receiving this medicine. Avoid taking products that contain aspirin, acetaminophen, ibuprofen, naproxen, or ketoprofen unless instructed by your doctor. These medicines may hide a fever. Do not become pregnant while taking this medicine. Women should    inform their doctor if they wish to become pregnant or think they might be pregnant. There is a potential for serious side effects to an unborn child. Talk to your health care professional or pharmacist for more information. Do not breast-feed an infant while taking this medicine. Men should inform their doctor if they wish to father a child. This medicine may lower sperm counts. Do not treat diarrhea with over the counter products. Contact your doctor if you have diarrhea that lasts more than 2 days or if it is severe and watery. This medicine can make you more sensitive to the sun. Keep out of the sun. If you cannot avoid being in the sun, wear protective clothing and use sunscreen. Do not use sun lamps or tanning beds/booths. What side effects may I notice from receiving this medicine? Side effects that you should report to your doctor or health care professional as soon as possible: -allergic reactions like skin rash, itching or hives, swelling of the face, lips, or tongue -low blood counts - this medicine may decrease the number of white blood cells, red blood cells and platelets. You may be at increased risk for infections and bleeding. -signs of infection - fever or chills, cough, sore throat, pain or difficulty passing urine -signs of decreased platelets or bleeding - bruising, pinpoint red spots on the skin, black, tarry stools, blood in the urine -signs of decreased red blood cells - unusually weak or tired, fainting spells, lightheadedness -breathing problems -changes in vision -chest pain -mouth sores -nausea and vomiting -pain, swelling, redness at site where injected -pain, tingling, numbness in the hands or feet -redness, swelling, or sores on hands or feet -stomach pain -unusual bleeding Side effects that usually do not require medical attention (report to your doctor or health care professional if they continue or are bothersome): -changes in finger or toe  nails -diarrhea -dry or itchy skin -hair loss -headache -loss of appetite -sensitivity of eyes to the light -stomach upset -unusually teary eyes This list may not describe all possible side effects. Call your doctor for medical advice about side effects. You may report side effects to FDA at 1-800-FDA-1088. Where should I keep my medicine? This drug is given in a hospital or clinic and will not be stored at home. NOTE: This sheet is a summary. It may not cover all possible information. If you have questions about this medicine, talk to your doctor, pharmacist, or health care provider.    2016, [employer]vier/[employer]. (2008-04-01 13:53:16)  

## 2016-08-21 NOTE — Consult Note (Signed)
Except an outstanding is perfect of Radiation Oncology NEW PATIENT EVALUATION  Name: Rachel [name]  MRN: [id number]  Date:   08/21/2016     DOB: [birth year]   This 60 y.o. female patient presents to the clinic for initial evaluation of locally advanced rectal carcinoma for preoperative chemoradiation.  REFERRING PHYSICIAN: Lynnell Jude, MD  CHIEF COMPLAINT:  Chief Complaint  Patient presents with  . Cancer    Pt is here for initial consultation of rectal cancer.      DIAGNOSIS: The encounter diagnosis was Rectal cancer ([place]).   PREVIOUS INVESTIGATIONS:  CT scans reviewed Pathology report reviewed Clinical notes reviewed  HPI: Patient is a 60 year old female with approximate 10 pound weight loss over the past several months and was noticed to have a significant history for chronic diarrhea and pain in the rectal area. This is progressed now to pain while she is sitting. She underwent lower endoscopy showing a partially obstructing fungating mass at 7 cm from the anus with significant luminal narrowing with biopsy positive for invasive moderately well-differentiated adenocarcinoma. CT scan confirmed locally advanced rectal cancer with probable direct extension into the mesorectum with suspicious perirectal and sigmoid mesocolon lymph nodes. No evidence of distant disease was noted. She is scheduled for PET CT scan tomorrow. She's been seen by medical oncology and plans have been made to go ahead with concurrent chemoradiation in a preoperative mode. She is oriented had a port placed. She is seen today for radiation oncology opinion. She still complains of chronic rectal pain mostly on sitting. Her appetite is poor.  PLANNED TREATMENT REGIMEN: Preoperative chemoradiation  PAST MEDICAL HISTORY:  has a past medical history of Body mass index (BMI) of 24.0-24.9 in adult; Cardiovascular disease; Current every day smoker; Headache; History of UTI; Hypertension; Midsternal chest pain; Stroke  [facility] [place]); and Syncope and collapse.    PAST SURGICAL HISTORY:  Past Surgical History:  Procedure Laterality Date  . ABDOMINAL HYSTERECTOMY    . BACK SURGERY    . CHOLECYSTECTOMY    . COLONOSCOPY WITH PROPOFOL N/A 08/15/2016   Procedure: COLONOSCOPY WITH PROPOFOL;  Surgeon: Lollie Sails, MD;  Location: [facility] ENDOSCOPY;  Service: Endoscopy;  Laterality: N/A;  . ERCP    . PARTIAL HYSTERECTOMY    . PORTACATH PLACEMENT N/A 08/18/2016   Procedure: INSERTION PORT-A-CATH;  Surgeon: Christene Lye, MD;  Location: [facility] ORS;  Service: General;  Laterality: N/A;    FAMILY HISTORY: family history includes Breast cancer in her mother; Heart disease in her paternal grandmother; Hyperlipidemia in her father; Stroke in her maternal grandfather.  SOCIAL HISTORY:  reports that she has been smoking Cigarettes.  She has a 8.75 pack-year smoking history. She has never used smokeless tobacco. She reports that she does not drink alcohol or use drugs.  ALLERGIES: Prednisone  MEDICATIONS:  Current Outpatient Prescriptions  Medication Sig Dispense Refill  . amLODipine (NORVASC) 10 MG tablet Take 1 tablet (10 mg total) by mouth every evening. 30 tablet 0  . clopidogrel (PLAVIX) 75 MG tablet Take 75 mg by mouth daily.    . isosorbide mononitrate (IMDUR) 30 MG 24 hr tablet Take 1 tablet (30 mg total) by mouth daily. 90 tablet 3  . lisinopril (PRINIVIL,ZESTRIL) 10 MG tablet Take 10 mg by mouth daily.    . metoprolol tartrate (LOPRESSOR) 25 MG tablet Take by mouth.    . nitroGLYCERIN (NITROSTAT) 0.4 MG SL tablet Place 1 tablet (0.4 mg total) under the tongue every 5 (five) minutes as  needed for chest pain. 25 tablet 6  . omeprazole (PRILOSEC) 20 MG capsule Take 20 mg by mouth daily.    [name] oxyCODONE (OXY IR/ROXICODONE) 5 MG immediate release tablet Take 1 tablet (5 mg total) by mouth every 4 (four) hours as needed for moderate pain. 30 tablet 0  . shark liver oil-cocoa butter (PREPARATION H) 0.25-3-85.5 %  suppository Place 1 suppository rectally daily as needed for hemorrhoids.    . traMADol (ULTRAM) 50 MG tablet Take 50 mg by mouth every 6 (six) hours as needed for moderate pain.     No current facility-administered medications for this encounter.     ECOG PERFORMANCE STATUS:  1 - Symptomatic but completely ambulatory  REVIEW OF SYSTEMS: Except for the rectal pain weight loss and diarrhea Patient denies any weight loss, fatigue, weakness, fever, chills or night sweats. Patient denies any loss of vision, blurred vision. Patient denies any ringing  of the ears or hearing loss. No irregular heartbeat. Patient denies heart murmur or history of fainting. Patient denies any chest pain or pain radiating to her upper extremities. Patient denies any shortness of breath, difficulty breathing at night, cough or hemoptysis. Patient denies any swelling in the lower legs. Patient denies any nausea vomiting, vomiting of blood, or coffee ground material in the vomitus. Patient denies any stomach pain. Patient states has had normal bowel movements no significant constipation or diarrhea. Patient denies any dysuria, hematuria or significant nocturia. Patient denies any problems walking, swelling in the joints or loss of balance. Patient denies any skin changes, loss of hair or loss of weight. Patient denies any excessive worrying or anxiety or significant depression. Patient denies any problems with insomnia. Patient denies excessive thirst, polyuria, polydipsia. Patient denies any swollen glands, patient denies easy bruising or easy bleeding. Patient denies any recent infections, allergies or URI. Patient "s visual fields have not changed significantly in recent time.    PHYSICAL EXAM: BP (!) 149/98   Pulse 80   Temp 97.3 F (36.3 C)   Resp 20   Wt 141 lb 15.6 oz (64.4 kg)   BMI 22.92 kg/m  Thin quite nervous female in NAD. Well-developed well-nourished patient in NAD. HEENT reveals PERLA, EOMI, discs not  visualized.  Oral cavity is clear. No oral mucosal lesions are identified. Neck is clear without evidence of cervical or supraclavicular adenopathy. Lungs are clear to A&P. Cardiac examination is essentially unremarkable with regular rate and rhythm without murmur rub or thrill. Abdomen is benign with no organomegaly or masses noted. Motor sensory and DTR levels are equal and symmetric in the upper and lower extremities. Cranial nerves II through XII are grossly intact. Proprioception is intact. No peripheral adenopathy or edema is identified. No motor or sensory levels are noted. Crude visual fields are within normal range.  LABORATORY DATA: Pathology reports reviewed    RADIOLOGY RESULTS: CT scans reviewed PET CT scan to be reviewed upon completion   IMPRESSION: Locally advanced rectal cancer with positive nodes by CT criteria in 60 year old female  PLAN: I personally and will review her PET CT scan when it becomes available. Otherwise I agree were dealing with a case of locally advanced rectal cancer with lymph node involvement and at least a T3 disease. I would recommend preoperative chemoradiation. Would treat to 4500 cGy to her rectal lesion as well as her pelvic lymph nodes boosting her rectum another 540 cGy in a preoperative mode. Risks and benefits of treatment including diarrhea possible increasing lower urinary tract  symptoms, alteration of blood counts, skin reaction, fatigue, all were discussed in detail with the patient and her family. I have personally set up and ordered CT simulation.There will be extra effort by both professional staff as well as technical staff to coordinate and manage concurrent chemoradiation and ensuing side effects during her treatments. I have personally discussed the case with medical oncology and will coordinate the scheduling of her chemotherapy and coordination with radiation treatments.  I would like to take this opportunity to thank you for allowing me to  participate in the care of your patient.Armstead Peaks., MD

## 2016-08-21 NOTE — Progress Notes (Signed)
  Oncology Nurse Navigator Documentation  Met with Rachel [name] along with her family during radiation consult with Dr Baruch Gouty. Introduced nurse navigator services and provided contact information for future questions or concerns. Pt requesting information regarding financial assistance with medical and regualr bills. Provided [place] "Summary of financial assistance and discount programs. Also made referral to PSN. Questions regarding FMLA answered. Provided education/prep for PET scan scheduled for 9/12.  Navigator Location: [facility] (08/21/16 1400) Navigator Encounter Type: Initial RadOnc (08/21/16 1400)     Confirmed Diagnosis Date: 08/16/16 (08/21/16 1400)     Patient Visit Type: RadOnc (08/21/16 1400) Treatment Phase: Pre-Tx/Tx Discussion (08/21/16 1400) Barriers/Navigation Needs: Financial (08/21/16 1400)   Interventions: Referrals;Coordination of Care;Education Method (08/21/16 1400)     Education Method: Verbal (08/21/16 1400)      Acuity: Level 2 (08/21/16 1400)   Acuity Level 2: Initial guidance, education and coordination as needed;Educational needs;Ongoing guidance and education throughout treatment as needed (08/21/16 1400)     Time Spent with Patient: 60 (08/21/16 1400)

## 2016-08-22 ENCOUNTER — Encounter
Admission: RE | Admit: 2016-08-22 | Discharge: 2016-08-22 | Disposition: A | Discharge status: Home / Self Care | Payer: BLUE CROSS/BLUE SHIELD | Source: Ambulatory Visit | Attending: Internal Medicine | Admitting: Internal Medicine

## 2016-08-22 ENCOUNTER — Other Ambulatory Visit: Payer: Self-pay | Admitting: Internal Medicine

## 2016-08-22 ENCOUNTER — Inpatient Hospital Stay: Payer: BLUE CROSS/BLUE SHIELD | Attending: Internal Medicine

## 2016-08-22 ENCOUNTER — Telehealth: Payer: Self-pay

## 2016-08-22 DIAGNOSIS — C20 Malignant neoplasm of rectum: Secondary | ICD-10-CM | POA: Insufficient documentation

## 2016-08-22 DIAGNOSIS — C775 Secondary and unspecified malignant neoplasm of intrapelvic lymph nodes: Secondary | ICD-10-CM | POA: Diagnosis present

## 2016-08-22 DIAGNOSIS — F17210 Nicotine dependence, cigarettes, uncomplicated: Secondary | ICD-10-CM | POA: Insufficient documentation

## 2016-08-22 DIAGNOSIS — E041 Nontoxic single thyroid nodule: Secondary | ICD-10-CM | POA: Insufficient documentation

## 2016-08-22 DIAGNOSIS — J069 Acute upper respiratory infection, unspecified: Secondary | ICD-10-CM | POA: Insufficient documentation

## 2016-08-22 DIAGNOSIS — Z5111 Encounter for antineoplastic chemotherapy: Secondary | ICD-10-CM | POA: Insufficient documentation

## 2016-08-22 DIAGNOSIS — G893 Neoplasm related pain (acute) (chronic): Secondary | ICD-10-CM | POA: Insufficient documentation

## 2016-08-22 DIAGNOSIS — Z79899 Other long term (current) drug therapy: Secondary | ICD-10-CM | POA: Insufficient documentation

## 2016-08-22 DIAGNOSIS — I10 Essential (primary) hypertension: Secondary | ICD-10-CM | POA: Insufficient documentation

## 2016-08-22 LAB — GLUCOSE, CAPILLARY: GLUCOSE-CAPILLARY: 78 mg/dL (ref 65–99)

## 2016-08-22 MED ORDER — FLUDEOXYGLUCOSE F - 18 (FDG) INJECTION
12.0000 | Freq: Once | INTRAVENOUS | Status: AC | PRN
  Administered 2016-08-22: 12.928 via INTRAVENOUS

## 2016-08-22 NOTE — Telephone Encounter (Signed)
  Oncology Nurse Navigator Documentation  Navigator Location: [facility] (08/22/16 1535) Navigator Encounter Type: Telephone (08/22/16 1535) Telephone: Incoming Call;Appt Confirmation/Clarification (08/22/16 1535)                                        Time Spent with Patient: 15 (08/22/16 1535)   Received call from Rachel [name] related to appts not being changed. Per Dr Baruch Gouty her chemo will not start 9-13 but will be reschedule to the following week. Instructed her to only come to appt with Dr Baruch Gouty 9-13 at 0900.

## 2016-08-23 ENCOUNTER — Inpatient Hospital Stay: Payer: BLUE CROSS/BLUE SHIELD

## 2016-08-23 ENCOUNTER — Ambulatory Visit
Admission: RE | Admit: 2016-08-23 | Discharge: 2016-08-23 | Disposition: A | Discharge status: Home / Self Care | Payer: BLUE CROSS/BLUE SHIELD | Source: Ambulatory Visit | Attending: Radiation Oncology | Admitting: Radiation Oncology

## 2016-08-23 ENCOUNTER — Other Ambulatory Visit: Payer: Self-pay | Admitting: *Deleted

## 2016-08-23 ENCOUNTER — Telehealth: Payer: Self-pay | Admitting: Internal Medicine

## 2016-08-23 ENCOUNTER — Other Ambulatory Visit: Payer: Self-pay | Admitting: Internal Medicine

## 2016-08-23 ENCOUNTER — Inpatient Hospital Stay: Payer: BLUE CROSS/BLUE SHIELD | Admitting: Internal Medicine

## 2016-08-23 ENCOUNTER — Ambulatory Visit: Payer: BLUE CROSS/BLUE SHIELD | Admitting: Hematology and Oncology

## 2016-08-23 DIAGNOSIS — Z95828 Presence of other vascular implants and grafts: Secondary | ICD-10-CM

## 2016-08-23 DIAGNOSIS — C20 Malignant neoplasm of rectum: Secondary | ICD-10-CM | POA: Diagnosis not present

## 2016-08-23 DIAGNOSIS — Z510 Encounter for antineoplastic radiation therapy: Secondary | ICD-10-CM | POA: Diagnosis present

## 2016-08-23 DIAGNOSIS — J029 Acute pharyngitis, unspecified: Secondary | ICD-10-CM

## 2016-08-23 DIAGNOSIS — C775 Secondary and unspecified malignant neoplasm of intrapelvic lymph nodes: Secondary | ICD-10-CM

## 2016-08-23 MED ORDER — AZITHROMYCIN 250 MG PO TABS: ORAL_TABLET | ORAL | 0 refills | Status: DC

## 2016-08-23 MED ORDER — PROCHLORPERAZINE MALEATE 10 MG PO TABS: 10.0000 mg | ORAL_TABLET | Freq: Four times a day (QID) | ORAL | 1 refills | Status: DC | PRN

## 2016-08-23 MED ORDER — LIDOCAINE-PRILOCAINE 2.5-2.5 % EX CREA: 1.0000 "application " | TOPICAL_CREAM | CUTANEOUS | 4 refills | Status: DC | PRN

## 2016-08-23 MED ORDER — ONDANSETRON HCL 8 MG PO TABS: 8.0000 mg | ORAL_TABLET | Freq: Two times a day (BID) | ORAL | 1 refills | Status: DC | PRN

## 2016-08-23 MED ORDER — AZITHROMYCIN 250 MG PO TABS: 500.0000 mg | ORAL_TABLET | Freq: Every day | ORAL | Status: AC

## 2016-08-23 MED ORDER — AZITHROMYCIN 250 MG PO TABS: 250.0000 mg | ORAL_TABLET | Freq: Every day | ORAL | Status: DC

## 2016-08-23 NOTE — Telephone Encounter (Signed)
x

## 2016-08-23 NOTE — Progress Notes (Signed)
Spoke with patient regarding results of the PET scan starting chemotherapy next week; concurrent with radiation. Again called a prescription for Z-Pak for sore throat. FYI-

## 2016-08-24 DIAGNOSIS — Z510 Encounter for antineoplastic radiation therapy: Secondary | ICD-10-CM | POA: Diagnosis not present

## 2016-08-30 ENCOUNTER — Ambulatory Visit
Admission: RE | Admit: 2016-08-30 | Discharge: 2016-08-30 | Disposition: A | Discharge status: Home / Self Care | Payer: BLUE CROSS/BLUE SHIELD | Source: Ambulatory Visit | Attending: Radiation Oncology | Admitting: Radiation Oncology

## 2016-08-30 DIAGNOSIS — Z510 Encounter for antineoplastic radiation therapy: Secondary | ICD-10-CM | POA: Diagnosis not present

## 2016-08-31 ENCOUNTER — Ambulatory Visit
Admission: RE | Admit: 2016-08-31 | Discharge: 2016-08-31 | Disposition: A | Discharge status: Home / Self Care | Payer: BLUE CROSS/BLUE SHIELD | Source: Ambulatory Visit | Attending: Radiation Oncology | Admitting: Radiation Oncology

## 2016-08-31 ENCOUNTER — Inpatient Hospital Stay: Payer: BLUE CROSS/BLUE SHIELD

## 2016-08-31 ENCOUNTER — Encounter (INDEPENDENT_AMBULATORY_CARE_PROVIDER_SITE_OTHER): Payer: Self-pay

## 2016-08-31 ENCOUNTER — Inpatient Hospital Stay (HOSPITAL_BASED_OUTPATIENT_CLINIC_OR_DEPARTMENT_OTHER): Payer: BLUE CROSS/BLUE SHIELD | Admitting: Internal Medicine

## 2016-08-31 VITALS — BP 116/80 | HR 94 | Temp 97.8°F | Resp 20 | Ht 66.0 in | Wt 139.6 lb

## 2016-08-31 DIAGNOSIS — Z5111 Encounter for antineoplastic chemotherapy: Secondary | ICD-10-CM | POA: Diagnosis not present

## 2016-08-31 DIAGNOSIS — C20 Malignant neoplasm of rectum: Secondary | ICD-10-CM | POA: Diagnosis present

## 2016-08-31 DIAGNOSIS — F17210 Nicotine dependence, cigarettes, uncomplicated: Secondary | ICD-10-CM | POA: Diagnosis not present

## 2016-08-31 DIAGNOSIS — G893 Neoplasm related pain (acute) (chronic): Secondary | ICD-10-CM | POA: Diagnosis not present

## 2016-08-31 DIAGNOSIS — C775 Secondary and unspecified malignant neoplasm of intrapelvic lymph nodes: Principal | ICD-10-CM

## 2016-08-31 DIAGNOSIS — Z79899 Other long term (current) drug therapy: Secondary | ICD-10-CM

## 2016-08-31 DIAGNOSIS — E041 Nontoxic single thyroid nodule: Secondary | ICD-10-CM

## 2016-08-31 DIAGNOSIS — K6289 Other specified diseases of anus and rectum: Secondary | ICD-10-CM

## 2016-08-31 DIAGNOSIS — Z510 Encounter for antineoplastic radiation therapy: Secondary | ICD-10-CM | POA: Diagnosis not present

## 2016-08-31 DIAGNOSIS — I10 Essential (primary) hypertension: Secondary | ICD-10-CM | POA: Diagnosis not present

## 2016-08-31 DIAGNOSIS — J069 Acute upper respiratory infection, unspecified: Secondary | ICD-10-CM | POA: Diagnosis not present

## 2016-08-31 LAB — CBC WITH DIFFERENTIAL/PLATELET
BASOS ABS: 0.1 10*3/uL (ref 0–0.1)
BASOS PCT: 1 %
EOS PCT: 1 %
Eosinophils Absolute: 0 10*3/uL (ref 0–0.7)
HCT: 40 % (ref 35.0–47.0)
Hemoglobin: 13.8 g/dL (ref 12.0–16.0)
Lymphocytes Relative: 20 %
Lymphs Abs: 1.6 10*3/uL (ref 1.0–3.6)
MCH: 29.7 pg (ref 26.0–34.0)
MCHC: 34.5 g/dL (ref 32.0–36.0)
MCV: 86.1 fL (ref 80.0–100.0)
MONO ABS: 0.4 10*3/uL (ref 0.2–0.9)
MONOS PCT: 5 %
Neutro Abs: 5.9 10*3/uL (ref 1.4–6.5)
Neutrophils Relative %: 73 %
PLATELETS: 172 10*3/uL (ref 150–440)
RBC: 4.65 MIL/uL (ref 3.80–5.20)
RDW: 14.5 % (ref 11.5–14.5)
WBC: 8.1 10*3/uL (ref 3.6–11.0)

## 2016-08-31 LAB — COMPREHENSIVE METABOLIC PANEL
ALBUMIN: 3.9 g/dL (ref 3.5–5.0)
ALT: 14 U/L (ref 14–54)
ANION GAP: 8 (ref 5–15)
AST: 22 U/L (ref 15–41)
Alkaline Phosphatase: 104 U/L (ref 38–126)
BUN: 10 mg/dL (ref 6–20)
CHLORIDE: 101 mmol/L (ref 101–111)
CO2: 27 mmol/L (ref 22–32)
Calcium: 8.8 mg/dL — ABNORMAL LOW (ref 8.9–10.3)
Creatinine, Ser: 0.76 mg/dL (ref 0.44–1.00)
GFR calc Af Amer: >60 mL/min (ref >60)
Glucose, Bld: 148 mg/dL — ABNORMAL HIGH (ref 65–99)
POTASSIUM: 3.3 mmol/L — AB (ref 3.5–5.1)
Sodium: 136 mmol/L (ref 135–145)
TOTAL PROTEIN: 7.3 g/dL (ref 6.5–8.1)
Total Bilirubin: 0.4 mg/dL (ref 0.3–1.2)

## 2016-08-31 MED ORDER — SODIUM CHLORIDE 0.9 % IV SOLN
Freq: Once | INTRAVENOUS | Status: DC
  Filled 2016-08-31: qty 1000

## 2016-08-31 MED ORDER — OXYCODONE-ACETAMINOPHEN 7.5-325 MG PO TABS: 1.0000 | ORAL_TABLET | ORAL | 0 refills | Status: DC | PRN

## 2016-08-31 MED ORDER — SODIUM CHLORIDE 0.9 % IV SOLN
225.0000 mg/m2/d | INTRAVENOUS | Status: DC
  Administered 2016-08-31: 2750 mg via INTRAVENOUS
  Filled 2016-08-31: qty 55

## 2016-08-31 NOTE — Progress Notes (Signed)
[place] OFFICE PROGRESS NOTE  Patient Care Team: Lynnell Jude, MD as PCP - General (Family Medicine)  No matching staging information was found for the patient.   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  female pleasant patient above history of Newly diagnosed rectal cancer is here to proceed with chemoradiation.  Patient continues to complain of rectal discomfort. Denies any constipation. Intermittent blood in stools. No nausea no vomiting. Appetite is fair.  REVIEW OF SYSTEMS:  A complete 10 point review of system is done which is negative except mentioned above/history of present illness.   PAST MEDICAL HISTORY :  Past Medical History:  Diagnosis Date  . Body mass index (BMI) of 24.0-24.9 in adult   . Cardiovascular disease   . Current every day smoker    a. ~35 years - > has cut down to < 3/4 ppd  . Headache   . History of UTI   . Hypertension   . Midsternal chest pain    a. 05/2014 Stress Echo: Ex time: 8:09, ECG w/o acute changes, EF nl with possible mid and apical anterior HK-->cath recommended.  . [place]    07/[place]  . Syncope and collapse     PAST SURGICAL HISTORY :   Past Surgical History:  Procedure Laterality Date  . ABDOMINAL HYSTERECTOMY    . BACK SURGERY    . CHOLECYSTECTOMY    . COLONOSCOPY WITH PROPOFOL N/A 08/15/2016   Procedure: COLONOSCOPY WITH PROPOFOL;  Surgeon: MLollie Sails MD;  [facility];  Service: Endoscopy;  Laterality: N/A;  . ERCP    . PARTIAL HYSTERECTOMY    . PORTACATH  PLACEMENT N/A 08/18/2016   Procedure: INSERTION PORT-A-CATH;  Surgeon: SChristene Lye MD;  Location: [facility] ORS;  Service: General;  Laterality: N/A;    FAMILY HISTORY :  Mom- gall bladder ca; breast cancer- 774s mat grand ma- stomach cancer- 612s sblings- 2 no cancers; 2 daughters- one daughter? Cervical  Family History  Problem Relation Age of Onset  . Breast cancer Mother   . Hyperlipidemia Father   . Heart disease Paternal Grandmother   . Stroke Maternal Grandfather     SOCIAL HISTORY:   Social History  Substance Use Topics  . Smoking status: Current Every Day Smoker    Packs/day: 0.25    Years: 35.00    Types: Cigarettes  . Smokeless tobacco: Never Used  . Alcohol use No     Comment: occasional    ALLERGIES:  is allergic to prednisone.  MEDICATIONS:  Current Outpatient Prescriptions  Medication Sig Dispense Refill  . amLODipine (NORVASC) 10 MG tablet Take 1 tablet (10 mg total) by mouth every evening. 30 tablet 0  . clopidogrel (PLAVIX) 75 MG tablet Take 75 mg by mouth daily.    . isosorbide mononitrate (IMDUR) 30 MG 24 hr tablet Take 1 tablet (30 mg total) by mouth daily. 90 tablet 3  . lidocaine-prilocaine (EMLA) cream Apply 1 application topically as needed. 30 g 4  .  lisinopril (PRINIVIL,ZESTRIL) 10 MG tablet Take 10 mg by mouth daily.    . metoprolol tartrate (LOPRESSOR) 25 MG tablet Take by mouth.    . ondansetron (ZOFRAN) 8 MG tablet Take 1 tablet (8 mg total) by mouth 2 (two) times daily as needed (Nausea or vomiting). 30 tablet 1  . prochlorperazine (COMPAZINE) 10 MG tablet Take 1 tablet (10 mg total) by mouth every 6 (six) hours as needed (Nausea or vomiting). 30 tablet 1  . nitroGLYCERIN (NITROSTAT) 0.4 MG SL tablet Place 1 tablet (0.4 mg total) under the tongue every 5 (five) minutes as needed for chest pain. (Patient not taking: Reported on 08/31/2016) 25 tablet 6  . oxyCODONE-acetaminophen (PERCOCET) 7.5-325 MG tablet Take 1 tablet by mouth every 4 (four)  hours as needed for severe pain. 90 tablet 0  . traMADol (ULTRAM) 50 MG tablet Take 50 mg by mouth every 6 (six) hours as needed for moderate pain.     No current facility-administered medications for this visit.     PHYSICAL EXAMINATION: ECOG PERFORMANCE STATUS: 1 - Symptomatic but completely ambulatory  BP 116/80 (BP Location: Right Arm, Patient Position: Sitting)   Pulse 94   Temp 97.8 F (36.6 C) (Tympanic)   Resp 20   Ht '5\' 6"'$  (1.676 m)   Wt 139 lb 9.6 oz (63.3 kg)   BMI 22.53 kg/m   Filed Weights   08/31/16 1000  Weight: 139 lb 9.6 oz (63.3 kg)    GENERAL: Well-nourished well-developed; Alert, no distress and comfortable.   With family.  EYES: no pallor or icterus OROPHARYNX: no thrush or ulceration; good dentition  NECK: supple, no masses felt LYMPH:  no palpable lymphadenopathy in the cervical, axillary or inguinal regions LUNGS: clear to auscultation and  No wheeze or crackles HEART/CVS: regular rate & rhythm and no murmurs; No lower extremity edema ABDOMEN:abdomen soft, non-tender and normal bowel sounds Musculoskeletal:no cyanosis of digits and no clubbing  PSYCH: alert & oriented x 3 with fluent speech NEURO: no focal motor/sensory deficits SKIN:  no rashes or significant lesions  LABORATORY DATA:  I have reviewed the data as listed    Component Value Date/Time   NA 136 08/31/2016 0942   NA 144 07/25/2014 0444   K 3.3 (L) 08/31/2016 0942   K 3.7 07/25/2014 0444   CL 101 08/31/2016 0942   CL 107 07/25/2014 0444   CO2 27 08/31/2016 0942   CO2 30 07/25/2014 0444   GLUCOSE 148 (H) 08/31/2016 0942   GLUCOSE 87 07/25/2014 0444   BUN 10 08/31/2016 0942   BUN 8 07/25/2014 0444   CREATININE 0.76 08/31/2016 0942   CREATININE 0.69 07/25/2014 0444   CALCIUM 8.8 (L) 08/31/2016 0942   CALCIUM 8.4 (L) 07/25/2014 0444   PROT 7.3 08/31/2016 0942   PROT 6.5 07/25/2014 0444   ALBUMIN 3.9 08/31/2016 0942   ALBUMIN 2.6 (L) 07/25/2014 0444   AST 22 08/31/2016 0942    AST 96 (H) 07/25/2014 0444   ALT 14 08/31/2016 0942   ALT 123 (H) 07/25/2014 0444   ALKPHOS 104 08/31/2016 0942   ALKPHOS 189 (H) 07/25/2014 0444   BILITOT 0.4 08/31/2016 0942   BILITOT 2.0 (H) 07/25/2014 0444   GFRNONAA >60 08/31/2016 0942   GFRNONAA >60 07/25/2014 0444   GFRAA >60 08/31/2016 0942   GFRAA >60 07/25/2014 0444    No results found for: SPEP, UPEP  Lab Results  Component Value Date   WBC 8.1 08/31/2016   NEUTROABS 5.9 08/31/2016  HGB 13.8 08/31/2016   HCT 40.0 08/31/2016   MCV 86.1 08/31/2016   PLT 172 08/31/2016      Chemistry      Component Value Date/Time   NA 136 08/31/2016 0942   NA 144 07/25/2014 0444   K 3.3 (L) 08/31/2016 0942   K 3.7 07/25/2014 0444   CL 101 08/31/2016 0942   CL 107 07/25/2014 0444   CO2 27 08/31/2016 0942   CO2 30 07/25/2014 0444   BUN 10 08/31/2016 0942   BUN 8 07/25/2014 0444   CREATININE 0.76 08/31/2016 0942   CREATININE 0.69 07/25/2014 0444      Component Value Date/Time   CALCIUM 8.8 (L) 08/31/2016 0942   CALCIUM 8.4 (L) 07/25/2014 0444   ALKPHOS 104 08/31/2016 0942   ALKPHOS 189 (H) 07/25/2014 0444   AST 22 08/31/2016 0942   AST 96 (H) 07/25/2014 0444   ALT 14 08/31/2016 0942   ALT 123 (H) 07/25/2014 0444   BILITOT 0.4 08/31/2016 0942   BILITOT 2.0 (H) 07/25/2014 0444       RADIOGRAPHIC STUDIES: I have personally reviewed the radiological images as listed and agreed with the findings in the report. No results found.   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  # thyroid nodule- positive on PET scan;  will need workup in future  # Rectal pain- secondary to underlying malignancy; new pain prescription for oxycodone given.  # smoking- recommended quitting smoking.  # Multiple family members-malignancy-  await MMR on surgical resection specimen.   # Lab/pump exchange in 1 week/follow-up with me in 2 weeks/labs 5-FU pump.    Orders Placed This Encounter  Procedures  . CBC with Differential    Standing Status:   Future    Standing Expiration Date:   08/31/2017  . Basic metabolic panel    Standing Status:   Future    Standing Expiration Date:   08/31/2017  . CBC with Differential    Standing Status:   Future    Standing Expiration Date:   08/31/2017  . Comprehensive metabolic panel    Standing Status:   Future    Standing Expiration Date:   08/31/2017   All questions were answered. The patient knows to call the clinic with any problems, questions or concerns.      Cammie Sickle, MD 09/01/2016 7:14 AM

## 2016-08-31 NOTE — Assessment & Plan Note (Addendum)
#  Rectal  Cancer- stage III- proceed neoadjuvant chemoradiation with 5-FU continuous infusion with radiation  # URI- recommend nasal decongestant antihistamine.  # thyroid nodule- positive on PET scan;  will need workup in future  # Rectal pain- secondary to underlying malignancy; new pain prescription for oxycodone given.  # smoking- recommended quitting smoking.  # Multiple family members-malignancy- await MMR on surgical resection specimen.   # Lab/pump exchange in 1 week/follow-up with me in 2 weeks/labs 5-FU pump.

## 2016-09-01 ENCOUNTER — Ambulatory Visit
Admission: RE | Admit: 2016-09-01 | Discharge: 2016-09-01 | Disposition: A | Discharge status: Home / Self Care | Payer: BLUE CROSS/BLUE SHIELD | Source: Ambulatory Visit | Attending: Radiation Oncology | Admitting: Radiation Oncology

## 2016-09-01 DIAGNOSIS — Z510 Encounter for antineoplastic radiation therapy: Secondary | ICD-10-CM | POA: Diagnosis not present

## 2016-09-04 ENCOUNTER — Encounter: Payer: Self-pay | Admitting: General Surgery

## 2016-09-04 ENCOUNTER — Ambulatory Visit (INDEPENDENT_AMBULATORY_CARE_PROVIDER_SITE_OTHER): Payer: BLUE CROSS/BLUE SHIELD | Admitting: General Surgery

## 2016-09-04 ENCOUNTER — Ambulatory Visit
Admission: RE | Admit: 2016-09-04 | Discharge: 2016-09-04 | Disposition: A | Discharge status: Home / Self Care | Payer: BLUE CROSS/BLUE SHIELD | Source: Ambulatory Visit | Attending: Radiation Oncology | Admitting: Radiation Oncology

## 2016-09-04 VITALS — BP 112/68 | Resp 12 | Ht 66.0 in | Wt 148.0 lb

## 2016-09-04 DIAGNOSIS — Z510 Encounter for antineoplastic radiation therapy: Secondary | ICD-10-CM | POA: Diagnosis not present

## 2016-09-04 DIAGNOSIS — C20 Malignant neoplasm of rectum: Secondary | ICD-10-CM

## 2016-09-04 NOTE — Patient Instructions (Signed)
Follow up in 8 weeks with anoscope exam in office.

## 2016-09-04 NOTE — Progress Notes (Signed)
Patient ID: IDALY LYSON, female   DOB: [birth year], 60 y.o.   MRN: [id number]  Chief Complaint  Patient presents with  . Other    port placement     HPI SHARENE PINKHAM is a 60 y.o. female here today for a follow up port-a-cath placement done on 08/18/16. She had her first treatment done on 08/31/16. She is doing well. Moving bowels. Denies any nausea, vomiting, distention.    I have reviewed the history of present illness with the patient.  HPI  Past Medical History:  Diagnosis Date  . Body mass index (BMI) of 24.0-24.9 in adult   . Cardiovascular disease   . Current every day smoker    a. ~35 years - > has cut down to < 3/4 ppd  . Headache   . History of UTI   . Hypertension   . Midsternal chest pain    a. 05/2014 Stress Echo: Ex time: 8:09, ECG w/o acute changes, EF nl with possible mid and apical anterior HK-->cath recommended.  . Stroke ([place])    07/2016  . Syncope and collapse     Past Surgical History:  Procedure Laterality Date  . ABDOMINAL HYSTERECTOMY    . BACK SURGERY    . CHOLECYSTECTOMY    . COLONOSCOPY WITH PROPOFOL N/A 08/15/2016   Procedure: COLONOSCOPY WITH PROPOFOL;  Surgeon: Lollie Sails, MD;  [facility];  Service: Endoscopy;  Laterality: N/A;  . ERCP    . PARTIAL HYSTERECTOMY    . PORTACATH PLACEMENT N/A 08/18/2016   Procedure: INSERTION PORT-A-CATH;  Surgeon: Christene Lye, MD;  Location: [facility] ORS;  Service: General;  Laterality: N/A;    Family History  Problem Relation Age of Onset  . Breast cancer Mother   . Hyperlipidemia Father   . Heart disease Paternal Grandmother   . Stroke Maternal Grandfather     Social History Social History  Substance Use Topics  . Smoking status: Former Smoker    Packs/day: 0.25    Years: 35.00    Types: Cigarettes    Quit date: 08/11/2016  . Smokeless tobacco: Never Used  . Alcohol use Not on file     Comment: occasional    Allergies  Allergen Reactions  . Prednisone Other (See  Comments)    BLISTERS IN MOUTH AND ON TONGUE    Current Outpatient Prescriptions  Medication Sig Dispense Refill  . amLODipine (NORVASC) 10 MG tablet Take 1 tablet (10 mg total) by mouth every evening. 30 tablet 0  . clopidogrel (PLAVIX) 75 MG tablet Take 75 mg by mouth daily.    . isosorbide mononitrate (IMDUR) 30 MG 24 hr tablet Take 1 tablet (30 mg total) by mouth daily. 90 tablet 3  . lidocaine-prilocaine (EMLA) cream Apply 1 application topically as needed. 30 g 4  . lisinopril (PRINIVIL,ZESTRIL) 10 MG tablet Take 10 mg by mouth daily.    . metoprolol tartrate (LOPRESSOR) 25 MG tablet Take by mouth.    . nitroGLYCERIN (NITROSTAT) 0.4 MG SL tablet Place 1 tablet (0.4 mg total) under the tongue every 5 (five) minutes as needed for chest pain. 25 tablet 6  . ondansetron (ZOFRAN) 8 MG tablet Take 1 tablet (8 mg total) by mouth 2 (two) times daily as needed (Nausea or vomiting). 30 tablet 1  . oxyCODONE-acetaminophen (PERCOCET) 7.5-325 MG tablet Take 1 tablet by mouth every 4 (four) hours as needed for severe pain. 90 tablet 0  . prochlorperazine (COMPAZINE) 10 MG tablet Take 1 tablet (  10 mg total) by mouth every 6 (six) hours as needed (Nausea or vomiting). 30 tablet 1  . traMADol (ULTRAM) 50 MG tablet Take 50 mg by mouth every 6 (six) hours as needed for moderate pain.     No current facility-administered medications for this visit.     Review of Systems Review of Systems  Constitutional: Negative.   Respiratory: Negative.   Cardiovascular: Negative.   Gastrointestinal: Negative.     Blood pressure 112/68, resp. rate 12, height 5\' 6"  (1.676 m), weight 148 lb (67.1 kg).  Physical Exam Physical Exam  Constitutional: She is oriented to person, place, and time. She appears well-developed and well-nourished.  Pulmonary/Chest:    Neurological: She is alert and oriented to person, place, and time.  Skin: Skin is warm and dry.    Data Reviewed Prior notes  Assessment      Carcinoma of rectum with retrograde obstruction. Patient still able to move bowels and is without nausea, vomiting, or distention.    Plan    Follow up in 8 weeks with anoscope exam in office. Advised to come back sooner if she becomes symptomatic, has difficulty moving bowels or becomes distended.      Olegario Emberson G 09/04/2016, 3:17 PM

## 2016-09-05 ENCOUNTER — Ambulatory Visit
Admission: RE | Admit: 2016-09-05 | Discharge: 2016-09-05 | Disposition: A | Discharge status: Home / Self Care | Payer: BLUE CROSS/BLUE SHIELD | Source: Ambulatory Visit | Attending: Radiation Oncology | Admitting: Radiation Oncology

## 2016-09-05 DIAGNOSIS — Z510 Encounter for antineoplastic radiation therapy: Secondary | ICD-10-CM | POA: Diagnosis not present

## 2016-09-05 NOTE — Progress Notes (Unsigned)
Patient had an appointment at 2:15pm today to meet with PSN.  Patient did not show up for her appointment.

## 2016-09-06 ENCOUNTER — Ambulatory Visit
Admission: RE | Admit: 2016-09-06 | Discharge: 2016-09-06 | Disposition: A | Discharge status: Home / Self Care | Payer: BLUE CROSS/BLUE SHIELD | Source: Ambulatory Visit | Attending: Radiation Oncology | Admitting: Radiation Oncology

## 2016-09-06 DIAGNOSIS — Z510 Encounter for antineoplastic radiation therapy: Secondary | ICD-10-CM | POA: Diagnosis not present

## 2016-09-07 ENCOUNTER — Inpatient Hospital Stay: Payer: BLUE CROSS/BLUE SHIELD

## 2016-09-07 ENCOUNTER — Other Ambulatory Visit: Payer: BLUE CROSS/BLUE SHIELD

## 2016-09-07 ENCOUNTER — Ambulatory Visit
Admission: RE | Admit: 2016-09-07 | Discharge: 2016-09-07 | Disposition: A | Discharge status: Home / Self Care | Payer: BLUE CROSS/BLUE SHIELD | Source: Ambulatory Visit | Attending: Radiation Oncology | Admitting: Radiation Oncology

## 2016-09-07 VITALS — BP 96/61 | HR 62 | Temp 97.0°F | Resp 18

## 2016-09-07 DIAGNOSIS — Z5111 Encounter for antineoplastic chemotherapy: Secondary | ICD-10-CM | POA: Diagnosis not present

## 2016-09-07 DIAGNOSIS — C775 Secondary and unspecified malignant neoplasm of intrapelvic lymph nodes: Principal | ICD-10-CM

## 2016-09-07 DIAGNOSIS — Z510 Encounter for antineoplastic radiation therapy: Secondary | ICD-10-CM | POA: Diagnosis not present

## 2016-09-07 DIAGNOSIS — C20 Malignant neoplasm of rectum: Secondary | ICD-10-CM

## 2016-09-07 LAB — CBC WITH DIFFERENTIAL/PLATELET
BASOS ABS: 0 10*3/uL (ref 0–0.1)
BASOS PCT: 1 %
EOS ABS: 0 10*3/uL (ref 0–0.7)
EOS PCT: 1 %
HCT: 38.2 % (ref 35.0–47.0)
Hemoglobin: 13 g/dL (ref 12.0–16.0)
Lymphocytes Relative: 18 %
Lymphs Abs: 1.2 10*3/uL (ref 1.0–3.6)
MCH: 29.5 pg (ref 26.0–34.0)
MCHC: 34 g/dL (ref 32.0–36.0)
MCV: 86.9 fL (ref 80.0–100.0)
MONO ABS: 0.5 10*3/uL (ref 0.2–0.9)
MONOS PCT: 8 %
Neutro Abs: 4.9 10*3/uL (ref 1.4–6.5)
Neutrophils Relative %: 72 %
PLATELETS: 192 10*3/uL (ref 150–440)
RBC: 4.4 MIL/uL (ref 3.80–5.20)
RDW: 14.6 % — AB (ref 11.5–14.5)
WBC: 6.6 10*3/uL (ref 3.6–11.0)

## 2016-09-07 LAB — COMPREHENSIVE METABOLIC PANEL
ALBUMIN: 3.7 g/dL (ref 3.5–5.0)
ALT: 18 U/L (ref 14–54)
ANION GAP: 7 (ref 5–15)
AST: 29 U/L (ref 15–41)
Alkaline Phosphatase: 91 U/L (ref 38–126)
BILIRUBIN TOTAL: 0.3 mg/dL (ref 0.3–1.2)
BUN: 17 mg/dL (ref 6–20)
CHLORIDE: 105 mmol/L (ref 101–111)
CO2: 27 mmol/L (ref 22–32)
Calcium: 9 mg/dL (ref 8.9–10.3)
Creatinine, Ser: 0.6 mg/dL (ref 0.44–1.00)
GFR calc Af Amer: >60 mL/min (ref >60)
GFR calc non Af Amer: >60 mL/min (ref >60)
GLUCOSE: 92 mg/dL (ref 65–99)
POTASSIUM: 3.6 mmol/L (ref 3.5–5.1)
SODIUM: 139 mmol/L (ref 135–145)
TOTAL PROTEIN: 7.2 g/dL (ref 6.5–8.1)

## 2016-09-07 MED ORDER — HEPARIN SOD (PORK) LOCK FLUSH 100 UNIT/ML IV SOLN
INTRAVENOUS | Status: AC
  Filled 2016-09-07: qty 5

## 2016-09-07 MED ORDER — SODIUM CHLORIDE 0.9% FLUSH
10.0000 mL | INTRAVENOUS | Status: DC | PRN
  Administered 2016-09-07: 10 mL
  Filled 2016-09-07: qty 10

## 2016-09-07 MED ORDER — SODIUM CHLORIDE 0.9 % IV SOLN
225.0000 mg/m2/d | INTRAVENOUS | Status: DC
  Administered 2016-09-07: 2750 mg via INTRAVENOUS
  Filled 2016-09-07: qty 50

## 2016-09-07 MED ORDER — HEPARIN SOD (PORK) LOCK FLUSH 100 UNIT/ML IV SOLN
500.0000 [IU] | Freq: Once | INTRAVENOUS | Status: AC | PRN
  Administered 2016-09-07: 500 [IU]

## 2016-09-08 ENCOUNTER — Ambulatory Visit
Admission: RE | Admit: 2016-09-08 | Discharge: 2016-09-08 | Disposition: A | Discharge status: Home / Self Care | Payer: BLUE CROSS/BLUE SHIELD | Source: Ambulatory Visit | Attending: Radiation Oncology | Admitting: Radiation Oncology

## 2016-09-08 DIAGNOSIS — Z510 Encounter for antineoplastic radiation therapy: Secondary | ICD-10-CM | POA: Diagnosis not present

## 2016-09-11 ENCOUNTER — Ambulatory Visit
Admission: RE | Admit: 2016-09-11 | Discharge: 2016-09-11 | Disposition: A | Discharge status: Home / Self Care | Payer: BLUE CROSS/BLUE SHIELD | Source: Ambulatory Visit | Attending: Radiation Oncology | Admitting: Radiation Oncology

## 2016-09-11 DIAGNOSIS — Z510 Encounter for antineoplastic radiation therapy: Secondary | ICD-10-CM | POA: Diagnosis not present

## 2016-09-12 ENCOUNTER — Ambulatory Visit
Admission: RE | Admit: 2016-09-12 | Discharge: 2016-09-12 | Disposition: A | Discharge status: Home / Self Care | Payer: BLUE CROSS/BLUE SHIELD | Source: Ambulatory Visit | Attending: Radiation Oncology | Admitting: Radiation Oncology

## 2016-09-12 DIAGNOSIS — Z510 Encounter for antineoplastic radiation therapy: Secondary | ICD-10-CM | POA: Diagnosis not present

## 2016-09-13 ENCOUNTER — Ambulatory Visit
Admission: RE | Admit: 2016-09-13 | Discharge: 2016-09-13 | Disposition: A | Discharge status: Home / Self Care | Payer: BLUE CROSS/BLUE SHIELD | Source: Ambulatory Visit | Attending: Radiation Oncology | Admitting: Radiation Oncology

## 2016-09-13 DIAGNOSIS — Z510 Encounter for antineoplastic radiation therapy: Secondary | ICD-10-CM | POA: Diagnosis not present

## 2016-09-13 NOTE — Progress Notes (Unsigned)
PSN met with patient to inform her about the financial hardship program with [facility] and to discuss assistance with the charitable funds.

## 2016-09-14 ENCOUNTER — Inpatient Hospital Stay: Payer: BLUE CROSS/BLUE SHIELD | Attending: Internal Medicine | Admitting: Internal Medicine

## 2016-09-14 ENCOUNTER — Ambulatory Visit
Admission: RE | Admit: 2016-09-14 | Discharge: 2016-09-14 | Disposition: A | Discharge status: Home / Self Care | Payer: BLUE CROSS/BLUE SHIELD | Source: Ambulatory Visit | Attending: Radiation Oncology | Admitting: Radiation Oncology

## 2016-09-14 ENCOUNTER — Inpatient Hospital Stay: Payer: BLUE CROSS/BLUE SHIELD

## 2016-09-14 VITALS — BP 115/73 | HR 74 | Temp 97.6°F | Resp 20 | Ht 66.0 in | Wt 148.0 lb

## 2016-09-14 DIAGNOSIS — R197 Diarrhea, unspecified: Secondary | ICD-10-CM | POA: Insufficient documentation

## 2016-09-14 DIAGNOSIS — C775 Secondary and unspecified malignant neoplasm of intrapelvic lymph nodes: Principal | ICD-10-CM

## 2016-09-14 DIAGNOSIS — I2510 Atherosclerotic heart disease of native coronary artery without angina pectoris: Secondary | ICD-10-CM | POA: Diagnosis not present

## 2016-09-14 DIAGNOSIS — G893 Neoplasm related pain (acute) (chronic): Secondary | ICD-10-CM | POA: Diagnosis not present

## 2016-09-14 DIAGNOSIS — Z5111 Encounter for antineoplastic chemotherapy: Secondary | ICD-10-CM | POA: Insufficient documentation

## 2016-09-14 DIAGNOSIS — E041 Nontoxic single thyroid nodule: Secondary | ICD-10-CM | POA: Diagnosis not present

## 2016-09-14 DIAGNOSIS — I10 Essential (primary) hypertension: Secondary | ICD-10-CM | POA: Insufficient documentation

## 2016-09-14 DIAGNOSIS — F17210 Nicotine dependence, cigarettes, uncomplicated: Secondary | ICD-10-CM | POA: Diagnosis not present

## 2016-09-14 DIAGNOSIS — Z79899 Other long term (current) drug therapy: Secondary | ICD-10-CM | POA: Diagnosis not present

## 2016-09-14 DIAGNOSIS — C20 Malignant neoplasm of rectum: Secondary | ICD-10-CM | POA: Diagnosis not present

## 2016-09-14 DIAGNOSIS — Z8673 Personal history of transient ischemic attack (TIA), and cerebral infarction without residual deficits: Secondary | ICD-10-CM | POA: Diagnosis not present

## 2016-09-14 DIAGNOSIS — E876 Hypokalemia: Secondary | ICD-10-CM | POA: Insufficient documentation

## 2016-09-14 DIAGNOSIS — Z510 Encounter for antineoplastic radiation therapy: Secondary | ICD-10-CM | POA: Diagnosis not present

## 2016-09-14 LAB — CBC WITH DIFFERENTIAL/PLATELET
BASOS PCT: 0 %
Basophils Absolute: 0 10*3/uL (ref 0–0.1)
EOS ABS: 0.3 10*3/uL (ref 0–0.7)
EOS PCT: 5 %
HCT: 36.9 % (ref 35.0–47.0)
Hemoglobin: 13 g/dL (ref 12.0–16.0)
LYMPHS ABS: 0.7 10*3/uL — AB (ref 1.0–3.6)
Lymphocytes Relative: 11 %
MCH: 30.8 pg (ref 26.0–34.0)
MCHC: 35.2 g/dL (ref 32.0–36.0)
MCV: 87.5 fL (ref 80.0–100.0)
Monocytes Absolute: 0.5 10*3/uL (ref 0.2–0.9)
Monocytes Relative: 8 %
Neutro Abs: 4.9 10*3/uL (ref 1.4–6.5)
Neutrophils Relative %: 76 %
PLATELETS: 137 10*3/uL — AB (ref 150–440)
RBC: 4.22 MIL/uL (ref 3.80–5.20)
RDW: 14.3 % (ref 11.5–14.5)
WBC: 6.5 10*3/uL (ref 3.6–11.0)

## 2016-09-14 LAB — COMPREHENSIVE METABOLIC PANEL
ALBUMIN: 3.4 g/dL — AB (ref 3.5–5.0)
ALT: 17 U/L (ref 14–54)
ANION GAP: 6 (ref 5–15)
AST: 24 U/L (ref 15–41)
Alkaline Phosphatase: 83 U/L (ref 38–126)
BUN: 12 mg/dL (ref 6–20)
CHLORIDE: 104 mmol/L (ref 101–111)
CO2: 30 mmol/L (ref 22–32)
Calcium: 8.8 mg/dL — ABNORMAL LOW (ref 8.9–10.3)
Creatinine, Ser: 0.8 mg/dL (ref 0.44–1.00)
GFR calc non Af Amer: >60 mL/min (ref >60)
Glucose, Bld: 95 mg/dL (ref 65–99)
Potassium: 3.9 mmol/L (ref 3.5–5.1)
SODIUM: 140 mmol/L (ref 135–145)
Total Bilirubin: <0.1 mg/dL — ABNORMAL LOW (ref 0.3–1.2)
Total Protein: 7.1 g/dL (ref 6.5–8.1)

## 2016-09-14 MED ORDER — HEPARIN SOD (PORK) LOCK FLUSH 100 UNIT/ML IV SOLN
500.0000 [IU] | Freq: Once | INTRAVENOUS | Status: AC
  Administered 2016-09-14: 500 [IU] via INTRAVENOUS

## 2016-09-14 MED ORDER — HEPARIN SOD (PORK) LOCK FLUSH 100 UNIT/ML IV SOLN
INTRAVENOUS | Status: AC
  Filled 2016-09-14: qty 5

## 2016-09-14 MED ORDER — SODIUM CHLORIDE 0.9% FLUSH
10.0000 mL | Freq: Once | INTRAVENOUS | Status: AC
  Administered 2016-09-14: 10 mL via INTRAVENOUS
  Filled 2016-09-14: qty 10

## 2016-09-14 MED ORDER — SODIUM CHLORIDE 0.9% FLUSH
10.0000 mL | INTRAVENOUS | Status: DC | PRN
  Administered 2016-09-14: 10 mL
  Filled 2016-09-14: qty 10

## 2016-09-14 MED ORDER — AMLODIPINE BESYLATE 10 MG PO TABS: 10.0000 mg | ORAL_TABLET | Freq: Every evening | ORAL | 0 refills | Status: DC

## 2016-09-14 MED ORDER — SODIUM CHLORIDE 0.9 % IV SOLN
225.0000 mg/m2/d | INTRAVENOUS | Status: DC
  Administered 2016-09-14: 2750 mg via INTRAVENOUS
  Filled 2016-09-14: qty 55

## 2016-09-14 NOTE — Assessment & Plan Note (Addendum)
#   Rectal  Cancer- stage III- proceed neoadjuvant chemoradiation with 5-FU continuous infusion with radiation; s/p 2 cycles- so far. Tolerating well.   # proceed with # 3 cycle today with RT  # thyroid nodule- positive on PET scan;  will need workup in future  # Rectal pain- secondary to underlying malignancy; on  Oxycodone.  # Lab/pump exchange in 1 week/follow-up with me in 2 weeks/labs 5-FU pump.

## 2016-09-14 NOTE — Progress Notes (Signed)
[place] OFFICE PROGRESS NOTE  Patient Care Team: Lynnell Jude, MD as PCP - General (Family Medicine)  No matching staging information was found for the patient.   Oncology History   # SEP 2017- RECTAL CA mod diff adeno; STAGE III [no EUS; positive ~60m LN; Dr.sankar] CEA-7; Sep 21st START 5FU-RT  # Right thyroid mass/PET positive-   # smoker/ TIA [aug 2017- Plavix]   # Will order MMR/Mol testing on surgical resection  # Multiple family members-malignancy- await MMR on surgical resection specimen.      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  female pleasant patient with rectal cancer is Currently on chemoradiation.  Patient continues to complain of rectal discomfort. Denies any constipation. Intermittent blood in stools. No nausea no vomiting. Appetite is fair. Denies any sores in the mouth. Otherwise no chest pain or shortness of breath or cough.   REVIEW OF SYSTEMS:  A complete 10 point review of system is done which is negative except mentioned above/history of present illness.   PAST MEDICAL HISTORY :  Past Medical History:  Diagnosis Date  . Body mass index (BMI) of 24.0-24.9 in adult   . Cardiovascular disease   . Current every day smoker    a. ~35 years - > has cut down to < 3/4 ppd  . Headache   . History of UTI   . Hypertension   . Midsternal chest pain    a. 05/2014 Stress Echo: Ex time: 8:09, ECG w/o acute changes, EF nl with possible mid and apical anterior HK-->cath recommended.  . [place]    07[place]  . Syncope and collapse     PAST SURGICAL HISTORY :   Past Surgical History:  Procedure Laterality Date  . ABDOMINAL HYSTERECTOMY    . BACK SURGERY    . CHOLECYSTECTOMY    . COLONOSCOPY WITH PROPOFOL N/A 08/15/2016   Procedure: COLONOSCOPY WITH PROPOFOL;  Surgeon: MLollie Sails MD;  [facility];  Service: Endoscopy;  Laterality: N/A;  . ERCP    . PARTIAL HYSTERECTOMY    . PORTACATH PLACEMENT N/A 08/18/2016   Procedure: INSERTION PORT-A-CATH;  Surgeon: SChristene Lye MD;  Location: [facility] ORS;  Service: General;  Laterality: N/A;    FAMILY HISTORY :  Mom- gall bladder ca; breast cancer- 723s mat grand ma- stomach cancer- 638s sblings- 2 no cancers; 2 daughters- one daughter? Cervical  Family History  Problem Relation Age of Onset  . Breast cancer Mother   . Hyperlipidemia Father   . Heart disease Paternal Grandmother   . Stroke Maternal Grandfather     SOCIAL HISTORY:   Social History  Substance Use Topics  . Smoking status: Former Smoker    Packs/day: 0.25    Years: 35.00    Types: Cigarettes    Quit date: 08/11/2016  . Smokeless tobacco: Never Used  . Alcohol use Not on file     Comment: occasional    ALLERGIES:  is allergic to prednisone.  MEDICATIONS:  Current Outpatient Prescriptions  Medication Sig Dispense Refill  . amLODipine (NORVASC) 10 MG tablet Take 1 tablet (10 mg total) by mouth every evening. 30 tablet 0  . clopidogrel (PLAVIX) 75 MG tablet Take 75 mg by mouth daily.    . isosorbide mononitrate (IMDUR) 30 MG  24 hr tablet Take 1 tablet (30 mg total) by mouth daily. 90 tablet 3  . lidocaine-prilocaine (EMLA) cream Apply 1 application topically as needed. 30 g 4  . lisinopril (PRINIVIL,ZESTRIL) 10 MG tablet Take 10 mg by mouth daily.    . metoprolol tartrate (LOPRESSOR) 25 MG tablet Take by mouth.    . oxyCODONE-acetaminophen (PERCOCET) 7.5-325 MG tablet Take 1 tablet by mouth every 4 (four) hours as needed for severe pain. 90 tablet 0  . traMADol (ULTRAM) 50 MG tablet Take 50 mg by mouth every 6 (six) hours as needed for moderate pain.    . nitroGLYCERIN (NITROSTAT) 0.4 MG SL tablet Place 1 tablet (0.4 mg total) under the tongue every 5 (five) minutes as needed for chest pain. (Patient not taking: Reported on  09/14/2016) 25 tablet 6  . ondansetron (ZOFRAN) 8 MG tablet Take 1 tablet (8 mg total) by mouth 2 (two) times daily as needed (Nausea or vomiting). (Patient not taking: Reported on 09/14/2016) 30 tablet 1  . prochlorperazine (COMPAZINE) 10 MG tablet Take 1 tablet (10 mg total) by mouth every 6 (six) hours as needed (Nausea or vomiting). (Patient not taking: Reported on 09/14/2016) 30 tablet 1   No current facility-administered medications for this visit.     PHYSICAL EXAMINATION: ECOG PERFORMANCE STATUS: 1 - Symptomatic but completely ambulatory  BP 115/73 (BP Location: Right Arm, Patient Position: Sitting)   Pulse 74   Temp 97.6 F (36.4 C) (Tympanic)   Resp 20   Ht '5\' 6"'$  (1.676 m)   Wt 148 lb (67.1 kg)   BMI 23.89 kg/m   Filed Weights   09/14/16 1125  Weight: 148 lb (67.1 kg)    GENERAL: Well-nourished well-developed; Alert, no distress and comfortable.   With family.  EYES: no pallor or icterus OROPHARYNX: no thrush or ulceration; good dentition  NECK: supple, no masses felt LYMPH:  no palpable lymphadenopathy in the cervical, axillary or inguinal regions LUNGS: clear to auscultation and  No wheeze or crackles HEART/CVS: regular rate & rhythm and no murmurs; No lower extremity edema ABDOMEN:abdomen soft, non-tender and normal bowel sounds Musculoskeletal:no cyanosis of digits and no clubbing  PSYCH: alert & oriented x 3 with fluent speech NEURO: no focal motor/sensory deficits SKIN:  no rashes or significant lesions  LABORATORY DATA:  I have reviewed the data as listed    Component Value Date/Time   NA 140 09/14/2016 1106   NA 144 07/25/2014 0444   K 3.9 09/14/2016 1106   K 3.7 07/25/2014 0444   CL 104 09/14/2016 1106   CL 107 07/25/2014 0444   CO2 30 09/14/2016 1106   CO2 30 07/25/2014 0444   GLUCOSE 95 09/14/2016 1106   GLUCOSE 87 07/25/2014 0444   BUN 12 09/14/2016 1106   BUN 8 07/25/2014 0444   CREATININE 0.80 09/14/2016 1106   CREATININE 0.69 07/25/2014  0444   CALCIUM 8.8 (L) 09/14/2016 1106   CALCIUM 8.4 (L) 07/25/2014 0444   PROT 7.1 09/14/2016 1106   PROT 6.5 07/25/2014 0444   ALBUMIN 3.4 (L) 09/14/2016 1106   ALBUMIN 2.6 (L) 07/25/2014 0444   AST 24 09/14/2016 1106   AST 96 (H) 07/25/2014 0444   ALT 17 09/14/2016 1106   ALT 123 (H) 07/25/2014 0444   ALKPHOS 83 09/14/2016 1106   ALKPHOS 189 (H) 07/25/2014 0444   BILITOT <0.1 (L) 09/14/2016 1106   BILITOT 2.0 (H) 07/25/2014 0444   GFRNONAA >60 09/14/2016 1106   GFRNONAA >60 07/25/2014 0444  GFRAA >60 09/14/2016 1106   GFRAA >60 07/25/2014 0444    No results found for: SPEP, UPEP  Lab Results  Component Value Date   WBC 6.5 09/14/2016   NEUTROABS 4.9 09/14/2016   HGB 13.0 09/14/2016   HCT 36.9 09/14/2016   MCV 87.5 09/14/2016   PLT 137 (L) 09/14/2016      Chemistry      Component Value Date/Time   NA 140 09/14/2016 1106   NA 144 07/25/2014 0444   K 3.9 09/14/2016 1106   K 3.7 07/25/2014 0444   CL 104 09/14/2016 1106   CL 107 07/25/2014 0444   CO2 30 09/14/2016 1106   CO2 30 07/25/2014 0444   BUN 12 09/14/2016 1106   BUN 8 07/25/2014 0444   CREATININE 0.80 09/14/2016 1106   CREATININE 0.69 07/25/2014 0444      Component Value Date/Time   CALCIUM 8.8 (L) 09/14/2016 1106   CALCIUM 8.4 (L) 07/25/2014 0444   ALKPHOS 83 09/14/2016 1106   ALKPHOS 189 (H) 07/25/2014 0444   AST 24 09/14/2016 1106   AST 96 (H) 07/25/2014 0444   ALT 17 09/14/2016 1106   ALT 123 (H) 07/25/2014 0444   BILITOT <0.1 (L) 09/14/2016 1106   BILITOT 2.0 (H) 07/25/2014 0444       RADIOGRAPHIC STUDIES: I have personally reviewed the radiological images as listed and agreed with the findings in the report. No results found.   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 Tolerating well.   # proceed with # 3 cycle  today with RT  # thyroid nodule- positive on PET scan;  will need workup in future  # Rectal pain- secondary to underlying malignancy; on  Oxycodone.  # Lab/pump exchange in 1 week/follow-up with me in 2 weeks/labs 5-FU pump.   No orders of the defined types were placed in this encounter.  All questions were answered. The patient knows to call the clinic with any problems, questions or concerns.      Cammie Sickle, MD 09/15/2016 7:38 AM

## 2016-09-15 ENCOUNTER — Other Ambulatory Visit: Payer: Self-pay | Admitting: *Deleted

## 2016-09-15 ENCOUNTER — Ambulatory Visit
Admission: RE | Admit: 2016-09-15 | Discharge: 2016-09-15 | Disposition: A | Discharge status: Home / Self Care | Payer: BLUE CROSS/BLUE SHIELD | Source: Ambulatory Visit | Attending: Radiation Oncology | Admitting: Radiation Oncology

## 2016-09-15 DIAGNOSIS — Z510 Encounter for antineoplastic radiation therapy: Secondary | ICD-10-CM | POA: Diagnosis not present

## 2016-09-18 ENCOUNTER — Ambulatory Visit
Admission: RE | Admit: 2016-09-18 | Discharge: 2016-09-18 | Disposition: A | Discharge status: Home / Self Care | Payer: BLUE CROSS/BLUE SHIELD | Source: Ambulatory Visit | Attending: Radiation Oncology | Admitting: Radiation Oncology

## 2016-09-18 DIAGNOSIS — Z510 Encounter for antineoplastic radiation therapy: Secondary | ICD-10-CM | POA: Diagnosis not present

## 2016-09-19 ENCOUNTER — Ambulatory Visit
Admission: RE | Admit: 2016-09-19 | Discharge: 2016-09-19 | Disposition: A | Discharge status: Home / Self Care | Payer: BLUE CROSS/BLUE SHIELD | Source: Ambulatory Visit | Attending: Radiation Oncology | Admitting: Radiation Oncology

## 2016-09-19 DIAGNOSIS — Z510 Encounter for antineoplastic radiation therapy: Secondary | ICD-10-CM | POA: Diagnosis not present

## 2016-09-20 ENCOUNTER — Other Ambulatory Visit: Payer: Self-pay | Admitting: *Deleted

## 2016-09-20 ENCOUNTER — Ambulatory Visit
Admission: RE | Admit: 2016-09-20 | Discharge: 2016-09-20 | Disposition: A | Discharge status: Home / Self Care | Payer: BLUE CROSS/BLUE SHIELD | Source: Ambulatory Visit | Attending: Radiation Oncology | Admitting: Radiation Oncology

## 2016-09-20 DIAGNOSIS — C775 Secondary and unspecified malignant neoplasm of intrapelvic lymph nodes: Principal | ICD-10-CM

## 2016-09-20 DIAGNOSIS — C20 Malignant neoplasm of rectum: Secondary | ICD-10-CM

## 2016-09-20 DIAGNOSIS — Z510 Encounter for antineoplastic radiation therapy: Secondary | ICD-10-CM | POA: Diagnosis not present

## 2016-09-20 MED ORDER — OXYCODONE HCL 5 MG PO TABS: 5.0000 mg | ORAL_TABLET | ORAL | 0 refills | Status: DC | PRN

## 2016-09-20 NOTE — Progress Notes (Signed)
Patient came to radiation therapy today. Requesting RF on oxycodone. Patient preference- She does not want to take percocet for pain anymore. Rn spoke with Dr. Rogue Bussing. New rx written for oxycodone.

## 2016-09-21 ENCOUNTER — Inpatient Hospital Stay: Payer: BLUE CROSS/BLUE SHIELD | Admitting: *Deleted

## 2016-09-21 ENCOUNTER — Ambulatory Visit: Payer: BLUE CROSS/BLUE SHIELD

## 2016-09-21 ENCOUNTER — Inpatient Hospital Stay: Payer: BLUE CROSS/BLUE SHIELD

## 2016-09-21 ENCOUNTER — Ambulatory Visit
Admission: RE | Admit: 2016-09-21 | Discharge: 2016-09-21 | Disposition: A | Discharge status: Home / Self Care | Payer: BLUE CROSS/BLUE SHIELD | Source: Ambulatory Visit | Attending: Radiation Oncology | Admitting: Radiation Oncology

## 2016-09-21 ENCOUNTER — Encounter (INDEPENDENT_AMBULATORY_CARE_PROVIDER_SITE_OTHER): Payer: Self-pay

## 2016-09-21 DIAGNOSIS — C775 Secondary and unspecified malignant neoplasm of intrapelvic lymph nodes: Principal | ICD-10-CM

## 2016-09-21 DIAGNOSIS — C20 Malignant neoplasm of rectum: Secondary | ICD-10-CM

## 2016-09-21 DIAGNOSIS — Z510 Encounter for antineoplastic radiation therapy: Secondary | ICD-10-CM | POA: Diagnosis not present

## 2016-09-21 LAB — CBC WITH DIFFERENTIAL/PLATELET
Basophils Absolute: 0 10*3/uL (ref 0–0.1)
Basophils Relative: 1 %
Eosinophils Absolute: 0.3 10*3/uL (ref 0–0.7)
Eosinophils Relative: 5 %
HEMATOCRIT: 37.6 % (ref 35.0–47.0)
HEMOGLOBIN: 12.9 g/dL (ref 12.0–16.0)
LYMPHS ABS: 0.6 10*3/uL — AB (ref 1.0–3.6)
LYMPHS PCT: 9 %
MCH: 30.2 pg (ref 26.0–34.0)
MCHC: 34.4 g/dL (ref 32.0–36.0)
MCV: 87.7 fL (ref 80.0–100.0)
MONOS PCT: 9 %
Monocytes Absolute: 0.5 10*3/uL (ref 0.2–0.9)
NEUTROS ABS: 4.9 10*3/uL (ref 1.4–6.5)
NEUTROS PCT: 76 %
Platelets: 147 10*3/uL — ABNORMAL LOW (ref 150–440)
RBC: 4.29 MIL/uL (ref 3.80–5.20)
RDW: 14.9 % — ABNORMAL HIGH (ref 11.5–14.5)
WBC: 6.4 10*3/uL (ref 3.6–11.0)

## 2016-09-21 LAB — COMPREHENSIVE METABOLIC PANEL
ALBUMIN: 3.7 g/dL (ref 3.5–5.0)
ALK PHOS: 93 U/L (ref 38–126)
ALT: 12 U/L — AB (ref 14–54)
AST: 18 U/L (ref 15–41)
Anion gap: 9 (ref 5–15)
BILIRUBIN TOTAL: 0.4 mg/dL (ref 0.3–1.2)
BUN: 15 mg/dL (ref 6–20)
CALCIUM: 9 mg/dL (ref 8.9–10.3)
CO2: 28 mmol/L (ref 22–32)
CREATININE: 0.59 mg/dL (ref 0.44–1.00)
Chloride: 101 mmol/L (ref 101–111)
GFR calc non Af Amer: >60 mL/min (ref >60)
GLUCOSE: 102 mg/dL — AB (ref 65–99)
Potassium: 3.2 mmol/L — ABNORMAL LOW (ref 3.5–5.1)
Sodium: 138 mmol/L (ref 135–145)
TOTAL PROTEIN: 7.2 g/dL (ref 6.5–8.1)

## 2016-09-21 MED ORDER — SODIUM CHLORIDE 0.9% FLUSH
10.0000 mL | INTRAVENOUS | Status: DC | PRN
  Administered 2016-09-21: 10 mL
  Filled 2016-09-21: qty 10

## 2016-09-21 MED ORDER — SODIUM CHLORIDE 0.9 % IV SOLN
225.0000 mg/m2/d | INTRAVENOUS | Status: DC
  Administered 2016-09-21: 2750 mg via INTRAVENOUS
  Filled 2016-09-21: qty 50

## 2016-09-22 ENCOUNTER — Ambulatory Visit
Admission: RE | Admit: 2016-09-22 | Discharge: 2016-09-22 | Disposition: A | Discharge status: Home / Self Care | Payer: BLUE CROSS/BLUE SHIELD | Source: Ambulatory Visit | Attending: Radiation Oncology | Admitting: Radiation Oncology

## 2016-09-22 DIAGNOSIS — Z510 Encounter for antineoplastic radiation therapy: Secondary | ICD-10-CM | POA: Diagnosis not present

## 2016-09-25 ENCOUNTER — Ambulatory Visit
Admission: RE | Admit: 2016-09-25 | Discharge: 2016-09-25 | Disposition: A | Discharge status: Home / Self Care | Payer: BLUE CROSS/BLUE SHIELD | Source: Ambulatory Visit | Attending: Radiation Oncology | Admitting: Radiation Oncology

## 2016-09-25 DIAGNOSIS — Z510 Encounter for antineoplastic radiation therapy: Secondary | ICD-10-CM | POA: Diagnosis not present

## 2016-09-26 ENCOUNTER — Ambulatory Visit
Admission: RE | Admit: 2016-09-26 | Discharge: 2016-09-26 | Disposition: A | Discharge status: Home / Self Care | Payer: BLUE CROSS/BLUE SHIELD | Source: Ambulatory Visit | Attending: Radiation Oncology | Admitting: Radiation Oncology

## 2016-09-26 DIAGNOSIS — Z510 Encounter for antineoplastic radiation therapy: Secondary | ICD-10-CM | POA: Diagnosis not present

## 2016-09-27 ENCOUNTER — Ambulatory Visit
Admission: RE | Admit: 2016-09-27 | Discharge: 2016-09-27 | Disposition: A | Discharge status: Home / Self Care | Payer: BLUE CROSS/BLUE SHIELD | Source: Ambulatory Visit | Attending: Radiation Oncology | Admitting: Radiation Oncology

## 2016-09-27 DIAGNOSIS — Z510 Encounter for antineoplastic radiation therapy: Secondary | ICD-10-CM | POA: Diagnosis not present

## 2016-09-28 ENCOUNTER — Inpatient Hospital Stay (HOSPITAL_BASED_OUTPATIENT_CLINIC_OR_DEPARTMENT_OTHER): Payer: BLUE CROSS/BLUE SHIELD | Admitting: Internal Medicine

## 2016-09-28 ENCOUNTER — Inpatient Hospital Stay: Payer: BLUE CROSS/BLUE SHIELD

## 2016-09-28 ENCOUNTER — Ambulatory Visit
Admission: RE | Admit: 2016-09-28 | Discharge: 2016-09-28 | Disposition: A | Discharge status: Home / Self Care | Payer: BLUE CROSS/BLUE SHIELD | Source: Ambulatory Visit | Attending: Radiation Oncology | Admitting: Radiation Oncology

## 2016-09-28 VITALS — BP 105/67 | HR 63 | Temp 96.9°F | Resp 18 | Ht 66.0 in | Wt 142.6 lb

## 2016-09-28 DIAGNOSIS — C20 Malignant neoplasm of rectum: Secondary | ICD-10-CM

## 2016-09-28 DIAGNOSIS — E041 Nontoxic single thyroid nodule: Secondary | ICD-10-CM

## 2016-09-28 DIAGNOSIS — R197 Diarrhea, unspecified: Secondary | ICD-10-CM

## 2016-09-28 DIAGNOSIS — C775 Secondary and unspecified malignant neoplasm of intrapelvic lymph nodes: Secondary | ICD-10-CM | POA: Diagnosis not present

## 2016-09-28 DIAGNOSIS — Z510 Encounter for antineoplastic radiation therapy: Secondary | ICD-10-CM | POA: Diagnosis not present

## 2016-09-28 DIAGNOSIS — F17210 Nicotine dependence, cigarettes, uncomplicated: Secondary | ICD-10-CM

## 2016-09-28 DIAGNOSIS — Z79899 Other long term (current) drug therapy: Secondary | ICD-10-CM

## 2016-09-28 DIAGNOSIS — E876 Hypokalemia: Secondary | ICD-10-CM

## 2016-09-28 DIAGNOSIS — G893 Neoplasm related pain (acute) (chronic): Secondary | ICD-10-CM

## 2016-09-28 LAB — COMPREHENSIVE METABOLIC PANEL
ALK PHOS: 92 U/L (ref 38–126)
ALT: 15 U/L (ref 14–54)
AST: 18 U/L (ref 15–41)
Albumin: 3.6 g/dL (ref 3.5–5.0)
Anion gap: 10 (ref 5–15)
BUN: 13 mg/dL (ref 6–20)
CALCIUM: 9 mg/dL (ref 8.9–10.3)
CO2: 30 mmol/L (ref 22–32)
CREATININE: 0.61 mg/dL (ref 0.44–1.00)
Chloride: 97 mmol/L — ABNORMAL LOW (ref 101–111)
Glucose, Bld: 94 mg/dL (ref 65–99)
Potassium: 3 mmol/L — ABNORMAL LOW (ref 3.5–5.1)
SODIUM: 137 mmol/L (ref 135–145)
Total Bilirubin: 0.5 mg/dL (ref 0.3–1.2)
Total Protein: 7 g/dL (ref 6.5–8.1)

## 2016-09-28 LAB — CBC WITH DIFFERENTIAL/PLATELET
Basophils Absolute: 0 10*3/uL (ref 0–0.1)
Basophils Relative: 1 %
Eosinophils Absolute: 0.5 10*3/uL (ref 0–0.7)
Eosinophils Relative: 7 %
HEMATOCRIT: 35.9 % (ref 35.0–47.0)
HEMOGLOBIN: 12.3 g/dL (ref 12.0–16.0)
LYMPHS ABS: 0.6 10*3/uL — AB (ref 1.0–3.6)
LYMPHS PCT: 9 %
MCH: 30.1 pg (ref 26.0–34.0)
MCHC: 34.4 g/dL (ref 32.0–36.0)
MCV: 87.8 fL (ref 80.0–100.0)
Monocytes Absolute: 0.7 10*3/uL (ref 0.2–0.9)
Monocytes Relative: 11 %
NEUTROS PCT: 72 %
Neutro Abs: 4.6 10*3/uL (ref 1.4–6.5)
Platelets: 164 10*3/uL (ref 150–440)
RBC: 4.09 MIL/uL (ref 3.80–5.20)
RDW: 15.4 % — ABNORMAL HIGH (ref 11.5–14.5)
WBC: 6.5 10*3/uL (ref 3.6–11.0)

## 2016-09-28 MED ORDER — HEPARIN SOD (PORK) LOCK FLUSH 100 UNIT/ML IV SOLN: 500.0000 [IU] | Freq: Once | INTRAVENOUS | Status: DC

## 2016-09-28 MED ORDER — POTASSIUM CHLORIDE CRYS ER 20 MEQ PO TBCR: EXTENDED_RELEASE_TABLET | ORAL | 3 refills | Status: DC

## 2016-09-28 MED ORDER — FLUOROURACIL CHEMO INJECTION 5 GM/100ML
225.0000 mg/m2/d | INTRAVENOUS | Status: DC
  Administered 2016-09-28: 2750 mg via INTRAVENOUS
  Filled 2016-09-28: qty 10

## 2016-09-28 MED ORDER — SODIUM CHLORIDE 0.9 % IJ SOLN
10.0000 mL | Freq: Once | INTRAMUSCULAR | Status: AC
  Administered 2016-09-28: 10 mL via INTRAVENOUS
  Filled 2016-09-28: qty 10

## 2016-09-28 MED ORDER — DIPHENOXYLATE-ATROPINE 2.5-0.025 MG PO TABS: 1.0000 | ORAL_TABLET | Freq: Four times a day (QID) | ORAL | 0 refills | Status: DC | PRN

## 2016-09-28 NOTE — Progress Notes (Signed)
Pt reports lower abdominal pain  that comes and goes, nausea and decrease of appetite and sensitive to smell.

## 2016-09-28 NOTE — Assessment & Plan Note (Addendum)
#   Rectal  Cancer- stage III- proceed neoadjuvant chemoradiation with 5-FU continuous infusion with radiation; s/p 4 cycles- so far. Tolerating well.   # proceed with # 5 cycle today with RT [last 30th Oct]. Awaiting evaluation with Dr. Jamal Collin in 2-3 weeks from now  # diarrhea-G-2; recommend imodium; lomotil prn.   # hypokalemia- recommend K-Dur 20.  # thyroid nodule- positive on PET scan;  will need workup in future  # Rectal pain- secondary to underlying malignancy; on  Oxycodone.  # Lab/pump exchange in 1 week/follow-up with me in 4 weeks/labs-

## 2016-09-28 NOTE — Progress Notes (Signed)
[place] OFFICE PROGRESS NOTE  Patient Care Team: Lynnell Jude, MD as PCP - General (Family Medicine)  No matching staging information was found for the patient.   Oncology History   # SEP 2017- RECTAL CA mod diff adeno; STAGE III [no EUS; positive ~54m LN; Dr.sankar] CEA-7; Sep 21st START 5FU-RT  # Right thyroid mass/PET positive-   # smoker/ TIA [aug 2017- Plavix]   # Will order MMR/Mol testing on surgical resection  # Multiple family members-malignancy- await MMR on surgical resection specimen.      [place]   08/16/2016 Initial Diagnosis    Rectal cancer metastasized [facility]rapelvic [facility] node ([facility]       INTERVAL HISTORY:  Rachel KATAYAMA89y.o.  female pleasant patient with rectal cancer is Currently on chemoradiation. Today is week #4.  Continues to have intermittent rectal discomfort. Currently Roxicodone. No gross blood in stools.. Diarrhea 3-4 loose stools a day. Intermittent nausea-vomiting.  No nausea no vomiting. Appetite is fair. Denies any sores in the mouth. Otherwise no chest pain or shortness of breath or cough.   REVIEW OF SYSTEMS:  A complete 10 point review of system is done which is negative except mentioned above/history of present illness.   PAST MEDICAL HISTORY :  Past Medical History:  Diagnosis Date  . Body mass index (BMI) of 24.0-24.9 in adult   . Cardiovascular disease   . Current every day smoker    a. ~35 years - > has cut down to < 3/4 ppd  . Headache   . History of UTI   . Hypertension   . Midsternal chest pain    a. 05/2014 Stress Echo: Ex time: 8:09, ECG w/o acute changes, EF nl with possible mid and apical anterior HK-->cath recommended.  . [place]    07/[place]  . Syncope and collapse     PAST SURGICAL HISTORY :   Past Surgical History:  Procedure Laterality Date  . ABDOMINAL HYSTERECTOMY    . BACK SURGERY    . CHOLECYSTECTOMY    . COLONOSCOPY WITH  PROPOFOL N/A 08/15/2016   Procedure: COLONOSCOPY WITH PROPOFOL;  Surgeon: MLollie Sails MD;  [facility];  Service: Endoscopy;  Laterality: N/A;  . ERCP    . PARTIAL HYSTERECTOMY    . PORTACATH PLACEMENT N/A 08/18/2016   Procedure: INSERTION PORT-A-CATH;  Surgeon: SChristene Lye MD;  Location: [facility] ORS;  Service: General;  Laterality: N/A;    FAMILY HISTORY :  Mom- gall bladder ca; breast cancer- 751s mat grand ma- stomach cancer- 661s sblings- 2 no cancers; 2 daughters- one daughter? Cervical  Family History  Problem Relation Age of Onset  . Breast cancer Mother   . Hyperlipidemia Father   . Heart disease Paternal Grandmother   . Stroke Maternal Grandfather     SOCIAL HISTORY:   Social History  Substance Use Topics  . Smoking status: Former Smoker    Packs/day: 0.25    Years: 35.00    Types: Cigarettes    Quit date: 08/11/2016  . Smokeless tobacco: Never Used  . Alcohol use Not on file     Comment: occasional    ALLERGIES:  is allergic to prednisone.  MEDICATIONS:  Current Outpatient Prescriptions  Medication Sig Dispense Refill  . amLODipine (NORVASC) 10 MG tablet Take 1 tablet (10 mg total) by mouth every evening. 30 tablet 0  . clopidogrel (PLAVIX) 75 MG tablet Take 75 mg  by mouth daily.    . isosorbide mononitrate (IMDUR) 30 MG 24 hr tablet Take 1 tablet (30 mg total) by mouth daily. 90 tablet 3  . lidocaine-prilocaine (EMLA) cream Apply 1 application topically as needed. 30 g 4  . lisinopril (PRINIVIL,ZESTRIL) 10 MG tablet Take 10 mg by mouth daily.    . metoprolol tartrate (LOPRESSOR) 25 MG tablet Take by mouth.    . nitroGLYCERIN (NITROSTAT) 0.4 MG SL tablet Place 1 tablet (0.4 mg total) under the tongue every 5 (five) minutes as needed for chest pain. 25 tablet 6  . ondansetron (ZOFRAN) 8 MG tablet Take 1 tablet (8 mg total) by mouth 2 (two) times daily as needed (Nausea or vomiting). 30 tablet 1  . oxyCODONE (OXY IR/ROXICODONE) 5 MG immediate  release tablet Take 1 tablet (5 mg total) by mouth every 4 (four) hours as needed for moderate pain. 30 tablet 0  . prochlorperazine (COMPAZINE) 10 MG tablet Take 1 tablet (10 mg total) by mouth every 6 (six) hours as needed (Nausea or vomiting). 30 tablet 1  . traMADol (ULTRAM) 50 MG tablet Take 50 mg by mouth every 6 (six) hours as needed for moderate pain.    . diphenoxylate-atropine (LOMOTIL) 2.5-0.025 MG tablet Take 1 tablet by mouth 4 (four) times daily as needed for diarrhea or loose stools. Take it along with immodium 30 tablet 0  . potassium chloride SA (K-DUR,KLOR-CON) 20 MEQ tablet 1 pill twice a day 30 tablet 3   No current facility-administered medications for this visit.    Facility-Administered Medications Ordered in Other Visits  Medication Dose Route Frequency Provider Last Rate Last Dose  . fluorouracil (ADRUCIL) 2,750 mg in sodium chloride 0.9 % 95 mL chemo infusion  225 mg/m2/day (Treatment Plan Recorded) Intravenous 7 days Cammie Sickle, MD   2,750 mg at 09/28/16 1201  . heparin lock flush 100 unit/mL  500 Units Intravenous Once Cammie Sickle, MD        PHYSICAL EXAMINATION: ECOG PERFORMANCE STATUS: 1 - Symptomatic but completely ambulatory  BP 105/67 (BP Location: Left Arm, Patient Position: Sitting)   Pulse 63   Temp (!) 96.9 F (36.1 C) (Tympanic)   Resp 18   Ht '5\' 6"'$  (1.676 m)   Wt 142 lb 9.6 oz (64.7 kg)   BMI 23.02 kg/m   Filed Weights   09/28/16 1107  Weight: 142 lb 9.6 oz (64.7 kg)    GENERAL: Well-nourished well-developed; Alert, no distress and comfortable.   With family.  EYES: no pallor or icterus OROPHARYNX: no thrush or ulceration; good dentition  NECK: supple, no masses felt LYMPH:  no palpable lymphadenopathy in the cervical, axillary or inguinal regions LUNGS: clear to auscultation and  No wheeze or crackles HEART/CVS: regular rate & rhythm and no murmurs; No lower extremity edema ABDOMEN:abdomen soft, non-tender and normal  bowel sounds Musculoskeletal:no cyanosis of digits and no clubbing  PSYCH: alert & oriented x 3 with fluent speech NEURO: no focal motor/sensory deficits SKIN:  no rashes or significant lesions  LABORATORY DATA:  I have reviewed the data as listed    Component Value Date/Time   NA 137 09/28/2016 1041   NA 144 07/25/2014 0444   K 3.0 (L) 09/28/2016 1041   K 3.7 07/25/2014 0444   CL 97 (L) 09/28/2016 1041   CL 107 07/25/2014 0444   CO2 30 09/28/2016 1041   CO2 30 07/25/2014 0444   GLUCOSE 94 09/28/2016 1041   GLUCOSE 87 07/25/2014 0444  BUN 13 09/28/2016 1041   BUN 8 07/25/2014 0444   CREATININE 0.61 09/28/2016 1041   CREATININE 0.69 07/25/2014 0444   CALCIUM 9.0 09/28/2016 1041   CALCIUM 8.4 (L) 07/25/2014 0444   PROT 7.0 09/28/2016 1041   PROT 6.5 07/25/2014 0444   ALBUMIN 3.6 09/28/2016 1041   ALBUMIN 2.6 (L) 07/25/2014 0444   AST 18 09/28/2016 1041   AST 96 (H) 07/25/2014 0444   ALT 15 09/28/2016 1041   ALT 123 (H) 07/25/2014 0444   ALKPHOS 92 09/28/2016 1041   ALKPHOS 189 (H) 07/25/2014 0444   BILITOT 0.5 09/28/2016 1041   BILITOT 2.0 (H) 07/25/2014 0444   GFRNONAA >60 09/28/2016 1041   GFRNONAA >60 07/25/2014 0444   GFRAA >60 09/28/2016 1041   GFRAA >60 07/25/2014 0444    No results found for: SPEP, UPEP  Lab Results  Component Value Date   WBC 6.5 09/28/2016   NEUTROABS 4.6 09/28/2016   HGB 12.3 09/28/2016   HCT 35.9 09/28/2016   MCV 87.8 09/28/2016   PLT 164 09/28/2016      Chemistry      Component Value Date/Time   NA 137 09/28/2016 1041   NA 144 07/25/2014 0444   K 3.0 (L) 09/28/2016 1041   K 3.7 07/25/2014 0444   CL 97 (L) 09/28/2016 1041   CL 107 07/25/2014 0444   CO2 30 09/28/2016 1041   CO2 30 07/25/2014 0444   BUN 13 09/28/2016 1041   BUN 8 07/25/2014 0444   CREATININE 0.61 09/28/2016 1041   CREATININE 0.69 07/25/2014 0444      Component Value Date/Time   CALCIUM 9.0 09/28/2016 1041   CALCIUM 8.4 (L) 07/25/2014 0444   ALKPHOS 92  09/28/2016 1041   ALKPHOS 189 (H) 07/25/2014 0444   AST 18 09/28/2016 1041   AST 96 (H) 07/25/2014 0444   ALT 15 09/28/2016 1041   ALT 123 (H) 07/25/2014 0444   BILITOT 0.5 09/28/2016 1041   BILITOT 2.0 (H) 07/25/2014 0444       RADIOGRAPHIC STUDIES: I have personally reviewed the radiological images as listed and agreed with the findings in the report. No results found.   ASSESSMENT & PLAN:  Rectal cancer metastasized to intrapelvic lymph node ([place]) # Rectal  Cancer- stage III- proceed neoadjuvant chemoradiation with 5-FU continuous infusion with radiation; s/p 4 cycles- so far. Tolerating well.   # proceed with # 5 cycle today with RT [last 30th Oct]. Awaiting evaluation with Dr. Jamal Collin in 2 weeks from now  # diarrhea-G-2; recommend imodium; lomotil prn.   # hypokalemia- recommend K-Dur 20.  # thyroid nodule- positive on PET scan;  will need workup in future  # Rectal pain- secondary to underlying malignancy; on  Oxycodone.  # Lab/pump exchange in 1 week/follow-up with me in 4 weeks/labs-    Orders Placed This Encounter  Procedures  . CBC with Differential    Standing Status:   Future    Standing Expiration Date:   09/28/2017  . Basic metabolic panel    Standing Status:   Future    Standing Expiration Date:   09/28/2017   All questions were answered. The patient knows to call the clinic with any problems, questions or concerns.      Cammie Sickle, MD 09/28/2016 1:39 PM

## 2016-09-29 ENCOUNTER — Ambulatory Visit
Admission: RE | Admit: 2016-09-29 | Discharge: 2016-09-29 | Disposition: A | Discharge status: Home / Self Care | Payer: BLUE CROSS/BLUE SHIELD | Source: Ambulatory Visit | Attending: Radiation Oncology | Admitting: Radiation Oncology

## 2016-09-29 DIAGNOSIS — Z510 Encounter for antineoplastic radiation therapy: Secondary | ICD-10-CM | POA: Diagnosis not present

## 2016-10-02 ENCOUNTER — Ambulatory Visit
Admission: RE | Admit: 2016-10-02 | Discharge: 2016-10-02 | Disposition: A | Discharge status: Home / Self Care | Payer: BLUE CROSS/BLUE SHIELD | Source: Ambulatory Visit | Attending: Radiation Oncology | Admitting: Radiation Oncology

## 2016-10-02 DIAGNOSIS — Z510 Encounter for antineoplastic radiation therapy: Secondary | ICD-10-CM | POA: Diagnosis not present

## 2016-10-03 ENCOUNTER — Ambulatory Visit
Admission: RE | Admit: 2016-10-03 | Discharge: 2016-10-03 | Disposition: A | Discharge status: Home / Self Care | Payer: BLUE CROSS/BLUE SHIELD | Source: Ambulatory Visit | Attending: Radiation Oncology | Admitting: Radiation Oncology

## 2016-10-03 DIAGNOSIS — Z510 Encounter for antineoplastic radiation therapy: Secondary | ICD-10-CM | POA: Diagnosis not present

## 2016-10-04 ENCOUNTER — Other Ambulatory Visit: Payer: Self-pay

## 2016-10-04 ENCOUNTER — Ambulatory Visit
Admission: RE | Admit: 2016-10-04 | Discharge: 2016-10-04 | Disposition: A | Discharge status: Home / Self Care | Payer: BLUE CROSS/BLUE SHIELD | Source: Ambulatory Visit | Attending: Radiation Oncology | Admitting: Radiation Oncology

## 2016-10-04 DIAGNOSIS — C20 Malignant neoplasm of rectum: Secondary | ICD-10-CM

## 2016-10-04 DIAGNOSIS — C775 Secondary and unspecified malignant neoplasm of intrapelvic lymph nodes: Principal | ICD-10-CM

## 2016-10-04 DIAGNOSIS — Z510 Encounter for antineoplastic radiation therapy: Secondary | ICD-10-CM | POA: Diagnosis not present

## 2016-10-04 MED ORDER — OXYCODONE HCL 5 MG PO TABS: 5.0000 mg | ORAL_TABLET | ORAL | 0 refills | Status: DC | PRN

## 2016-10-05 ENCOUNTER — Ambulatory Visit
Admission: RE | Admit: 2016-10-05 | Discharge: 2016-10-05 | Disposition: A | Discharge status: Home / Self Care | Payer: BLUE CROSS/BLUE SHIELD | Source: Ambulatory Visit | Attending: Radiation Oncology | Admitting: Radiation Oncology

## 2016-10-05 ENCOUNTER — Inpatient Hospital Stay: Payer: BLUE CROSS/BLUE SHIELD

## 2016-10-05 DIAGNOSIS — C20 Malignant neoplasm of rectum: Secondary | ICD-10-CM

## 2016-10-05 DIAGNOSIS — C775 Secondary and unspecified malignant neoplasm of intrapelvic lymph nodes: Principal | ICD-10-CM

## 2016-10-05 DIAGNOSIS — Z510 Encounter for antineoplastic radiation therapy: Secondary | ICD-10-CM | POA: Diagnosis not present

## 2016-10-05 LAB — CBC WITH DIFFERENTIAL/PLATELET
BASOS ABS: 0 10*3/uL (ref 0–0.1)
BASOS PCT: 0 %
EOS ABS: 0.2 10*3/uL (ref 0–0.7)
Eosinophils Relative: 4 %
HCT: 36.4 % (ref 35.0–47.0)
HEMOGLOBIN: 12.6 g/dL (ref 12.0–16.0)
Lymphocytes Relative: 7 %
Lymphs Abs: 0.4 10*3/uL — ABNORMAL LOW (ref 1.0–3.6)
MCH: 30.8 pg (ref 26.0–34.0)
MCHC: 34.7 g/dL (ref 32.0–36.0)
MCV: 88.8 fL (ref 80.0–100.0)
MONOS PCT: 11 %
Monocytes Absolute: 0.6 10*3/uL (ref 0.2–0.9)
NEUTROS ABS: 4.7 10*3/uL (ref 1.4–6.5)
NEUTROS PCT: 78 %
Platelets: 181 10*3/uL (ref 150–440)
RBC: 4.1 MIL/uL (ref 3.80–5.20)
RDW: 16.8 % — ABNORMAL HIGH (ref 11.5–14.5)
WBC: 6 10*3/uL (ref 3.6–11.0)

## 2016-10-05 LAB — COMPREHENSIVE METABOLIC PANEL
ALBUMIN: 3.5 g/dL (ref 3.5–5.0)
ALK PHOS: 96 U/L (ref 38–126)
ALT: 23 U/L (ref 14–54)
ANION GAP: 9 (ref 5–15)
AST: 25 U/L (ref 15–41)
BUN: 13 mg/dL (ref 6–20)
CALCIUM: 8.8 mg/dL — AB (ref 8.9–10.3)
CO2: 27 mmol/L (ref 22–32)
Chloride: 98 mmol/L — ABNORMAL LOW (ref 101–111)
Creatinine, Ser: 0.66 mg/dL (ref 0.44–1.00)
GFR calc Af Amer: >60 mL/min (ref >60)
GFR calc non Af Amer: >60 mL/min (ref >60)
GLUCOSE: 98 mg/dL (ref 65–99)
Potassium: 3.3 mmol/L — ABNORMAL LOW (ref 3.5–5.1)
SODIUM: 134 mmol/L — AB (ref 135–145)
Total Bilirubin: 0.2 mg/dL — ABNORMAL LOW (ref 0.3–1.2)
Total Protein: 6.9 g/dL (ref 6.5–8.1)

## 2016-10-05 MED ORDER — FLUOROURACIL CHEMO INJECTION 5 GM/100ML
225.0000 mg/m2/d | INTRAVENOUS | Status: DC
  Administered 2016-10-05: 2750 mg via INTRAVENOUS
  Filled 2016-10-05: qty 50

## 2016-10-06 ENCOUNTER — Ambulatory Visit
Admission: RE | Admit: 2016-10-06 | Discharge: 2016-10-06 | Disposition: A | Discharge status: Home / Self Care | Payer: BLUE CROSS/BLUE SHIELD | Source: Ambulatory Visit | Attending: Radiation Oncology | Admitting: Radiation Oncology

## 2016-10-06 DIAGNOSIS — Z510 Encounter for antineoplastic radiation therapy: Secondary | ICD-10-CM | POA: Diagnosis not present

## 2016-10-09 ENCOUNTER — Ambulatory Visit
Admission: RE | Admit: 2016-10-09 | Discharge: 2016-10-09 | Disposition: A | Discharge status: Home / Self Care | Payer: BLUE CROSS/BLUE SHIELD | Source: Ambulatory Visit | Attending: Radiation Oncology | Admitting: Radiation Oncology

## 2016-10-09 DIAGNOSIS — Z510 Encounter for antineoplastic radiation therapy: Secondary | ICD-10-CM | POA: Diagnosis not present

## 2016-10-12 ENCOUNTER — Inpatient Hospital Stay: Payer: BLUE CROSS/BLUE SHIELD | Attending: Internal Medicine

## 2016-10-12 VITALS — BP 94/67 | HR 84 | Resp 20

## 2016-10-12 DIAGNOSIS — C775 Secondary and unspecified malignant neoplasm of intrapelvic lymph nodes: Secondary | ICD-10-CM | POA: Insufficient documentation

## 2016-10-12 DIAGNOSIS — Z8673 Personal history of transient ischemic attack (TIA), and cerebral infarction without residual deficits: Secondary | ICD-10-CM | POA: Insufficient documentation

## 2016-10-12 DIAGNOSIS — C20 Malignant neoplasm of rectum: Secondary | ICD-10-CM | POA: Diagnosis not present

## 2016-10-12 DIAGNOSIS — I10 Essential (primary) hypertension: Secondary | ICD-10-CM | POA: Diagnosis not present

## 2016-10-12 DIAGNOSIS — Z79899 Other long term (current) drug therapy: Secondary | ICD-10-CM | POA: Diagnosis not present

## 2016-10-12 DIAGNOSIS — Z923 Personal history of irradiation: Secondary | ICD-10-CM | POA: Insufficient documentation

## 2016-10-12 DIAGNOSIS — Z9221 Personal history of antineoplastic chemotherapy: Secondary | ICD-10-CM | POA: Insufficient documentation

## 2016-10-12 DIAGNOSIS — E041 Nontoxic single thyroid nodule: Secondary | ICD-10-CM | POA: Diagnosis not present

## 2016-10-12 DIAGNOSIS — I2510 Atherosclerotic heart disease of native coronary artery without angina pectoris: Secondary | ICD-10-CM | POA: Diagnosis not present

## 2016-10-12 DIAGNOSIS — Z87891 Personal history of nicotine dependence: Secondary | ICD-10-CM | POA: Insufficient documentation

## 2016-10-12 MED ORDER — HEPARIN SOD (PORK) LOCK FLUSH 100 UNIT/ML IV SOLN
500.0000 [IU] | Freq: Once | INTRAVENOUS | Status: AC | PRN
  Administered 2016-10-12: 500 [IU]
  Filled 2016-10-12: qty 5

## 2016-10-24 ENCOUNTER — Ambulatory Visit (INDEPENDENT_AMBULATORY_CARE_PROVIDER_SITE_OTHER): Payer: BLUE CROSS/BLUE SHIELD | Admitting: General Surgery

## 2016-10-24 VITALS — BP 124/72 | HR 76 | Resp 14 | Ht 66.0 in | Wt 138.0 lb

## 2016-10-24 DIAGNOSIS — C20 Malignant neoplasm of rectum: Secondary | ICD-10-CM | POA: Diagnosis not present

## 2016-10-24 DIAGNOSIS — E041 Nontoxic single thyroid nodule: Secondary | ICD-10-CM | POA: Diagnosis not present

## 2016-10-24 MED ORDER — POLYETHYLENE GLYCOL 3350 17 GM/SCOOP PO POWD: 1.0000 | Freq: Once | ORAL | 0 refills | Status: AC

## 2016-10-24 NOTE — Patient Instructions (Signed)
The patient is scheduled for a Colonoscopy at [facility] on 11/08/16. They are aware to call the day before to get their arrival time. She will stop her Plavix 1 week prior. seh will only take her blood pressure medication at 6 am with a sip of water the morning of. Miralax prescription has been sent into the patient's pharmacy. The patient is aware of date and instructions.

## 2016-10-24 NOTE — Progress Notes (Signed)
Patient ID: Rachel [name], female   DOB: February 22, 1956, 60 y.o.   MRN: [id number]  Chief Complaint  Patient presents with  . Follow-up    HPI Rachel [name] is a 60 y.o. female here today for follow up rectal cancer. She completed chemotherapy and radiation 2 weeks ago. Tolerated it well. However, patient states the rectal area is very sore and complains of diffuse pain throughout body. Complains of pain with bowel movements and diarrhea every few days. Denies rectal bleeding.  I have reviewed the history of present illness with the patient.  HPI  Past Medical History:  Diagnosis Date  . Body mass index (BMI) of 24.0-24.9 in adult   . Cardiovascular disease   . Current every day smoker    a. ~35 years - > has cut down to < 3/4 ppd  . Headache   . History of UTI   . Hypertension   . Midsternal chest pain    a. 05/2014 Stress Echo: Ex time: 8:09, ECG w/o acute changes, EF nl with possible mid and apical anterior HK-->cath recommended.  . Stroke ([place])    07/2016  . Syncope and collapse     Past Surgical History:  Procedure Laterality Date  . ABDOMINAL HYSTERECTOMY    . BACK SURGERY    . CHOLECYSTECTOMY    . COLONOSCOPY WITH PROPOFOL N/A 08/15/2016   Procedure: COLONOSCOPY WITH PROPOFOL;  Surgeon: Lollie Sails, MD;  Location: [facility]COPY;  Service: Endoscopy;  Laterality: N/A;  . ERCP    . PARTIAL HYSTERECTOMY    . PORTACATH PLACEMENT N/A 08/18/2016   Procedure: INSERTION PORT-A-CATH;  Surgeon: Christene Lye, MD;  Location: [facility] ORS;  Service: General;  Laterality: N/A;    Family History  Problem Relation Age of Onset  . Breast cancer Mother   . Hyperlipidemia Father   . Heart disease Paternal Grandmother   . Stroke Maternal Grandfather     Social History Social History  Substance Use Topics  . Smoking status: Former Smoker    Packs/day: 0.25    Years: 35.00    Types: Cigarettes    Quit date: 08/11/2016  . Smokeless tobacco: Never Used  . Alcohol use Not  on file     Comment: occasional    Allergies  Allergen Reactions  . Prednisone Other (See Comments)    BLISTERS IN MOUTH AND ON TONGUE    Current Outpatient Prescriptions  Medication Sig Dispense Refill  . amLODipine (NORVASC) 10 MG tablet Take 1 tablet (10 mg total) by mouth every evening. 30 tablet 0  . clopidogrel (PLAVIX) 75 MG tablet Take 75 mg by mouth daily.    . diphenoxylate-atropine (LOMOTIL) 2.5-0.025 MG tablet Take 1 tablet by mouth 4 (four) times daily as needed for diarrhea or loose stools. Take it along with immodium 30 tablet 0  . isosorbide mononitrate (IMDUR) 30 MG 24 hr tablet Take 1 tablet (30 mg total) by mouth daily. 90 tablet 3  . lidocaine-prilocaine (EMLA) cream Apply 1 application topically as needed. 30 g 4  . lisinopril (PRINIVIL,ZESTRIL) 10 MG tablet Take 10 mg by mouth daily.    . metoprolol tartrate (LOPRESSOR) 25 MG tablet Take by mouth.    . nitroGLYCERIN (NITROSTAT) 0.4 MG SL tablet Place 1 tablet (0.4 mg total) under the tongue every 5 (five) minutes as needed for chest pain. 25 tablet 6  . ondansetron (ZOFRAN) 8 MG tablet Take 1 tablet (8 mg total) by mouth 2 (two) times daily as  needed (Nausea or vomiting). 30 tablet 1  . oxyCODONE (OXY IR/ROXICODONE) 5 MG immediate release tablet Take 1 tablet (5 mg total) by mouth every 4 (four) hours as needed for moderate pain. 30 tablet 0  . potassium chloride SA (K-DUR,KLOR-CON) 20 MEQ tablet 1 pill twice a day 30 tablet 3  . prochlorperazine (COMPAZINE) 10 MG tablet Take 1 tablet (10 mg total) by mouth every 6 (six) hours as needed (Nausea or vomiting). 30 tablet 1  . traMADol (ULTRAM) 50 MG tablet Take 50 mg by mouth every 6 (six) hours as needed for moderate pain.     No current facility-administered medications for this visit.     Review of Systems Review of Systems  Constitutional: Negative.   Respiratory: Negative.   Cardiovascular: Negative.     Blood pressure 124/72, pulse 76, resp. rate 14,  height 5\' 6"  (1.676 m), weight 138 lb (62.6 kg).  Physical Exam Physical Exam  Constitutional: She is oriented to person, place, and time. She appears well-developed and well-nourished.  Eyes: Conjunctivae are normal. No scleral icterus.  Neck: Neck supple.  Cardiovascular: Normal rate, regular rhythm and normal heart sounds.   Pulmonary/Chest: Effort normal and breath sounds normal.  Port a cath placement. Stable  Abdominal: Soft. Bowel sounds are normal. There is no hepatomegaly. There is tenderness in the right lower quadrant, suprapubic area and left lower quadrant.  Genitourinary: Rectal exam shows tenderness. Rectal exam shows no mass.  Genitourinary Comments: Digital exam performed with Xylocaine jelly. Can not palpate rectal lesion  Lymphadenopathy:    She has no cervical adenopathy.  Neurological: She is alert and oriented to person, place, and time.  Skin: Skin is warm and dry.    Data Reviewed Previous notes and PET scan and recent labs Assessment    Rectal cancer, completed chemo and radiation 2 weeks ago Active right thyroid nodule on PET scan    Plan    Repeat colonoscopy- unable to visualize colon past rectum due to obstructing mass  Right thyroid nodule biopsy to evaluate for malignancy    This information has been scribed by [name] CMA.   Cristoval Teall G 10/24/2016, 9:38 AM

## 2016-10-26 ENCOUNTER — Inpatient Hospital Stay (HOSPITAL_BASED_OUTPATIENT_CLINIC_OR_DEPARTMENT_OTHER): Payer: BLUE CROSS/BLUE SHIELD | Admitting: Internal Medicine

## 2016-10-26 ENCOUNTER — Inpatient Hospital Stay: Payer: BLUE CROSS/BLUE SHIELD

## 2016-10-26 VITALS — BP 124/84 | HR 80 | Temp 97.8°F | Resp 20 | Ht 66.0 in | Wt 137.9 lb

## 2016-10-26 DIAGNOSIS — E041 Nontoxic single thyroid nodule: Secondary | ICD-10-CM | POA: Diagnosis not present

## 2016-10-26 DIAGNOSIS — C775 Secondary and unspecified malignant neoplasm of intrapelvic lymph nodes: Secondary | ICD-10-CM

## 2016-10-26 DIAGNOSIS — C20 Malignant neoplasm of rectum: Secondary | ICD-10-CM

## 2016-10-26 DIAGNOSIS — Z79899 Other long term (current) drug therapy: Secondary | ICD-10-CM

## 2016-10-26 DIAGNOSIS — Z9221 Personal history of antineoplastic chemotherapy: Secondary | ICD-10-CM | POA: Diagnosis not present

## 2016-10-26 DIAGNOSIS — Z923 Personal history of irradiation: Secondary | ICD-10-CM

## 2016-10-26 LAB — COMPREHENSIVE METABOLIC PANEL
ALK PHOS: 93 U/L (ref 38–126)
ALT: 11 U/L — AB (ref 14–54)
AST: 15 U/L (ref 15–41)
Albumin: 3.5 g/dL (ref 3.5–5.0)
Anion gap: 7 (ref 5–15)
BILIRUBIN TOTAL: 0.4 mg/dL (ref 0.3–1.2)
BUN: 10 mg/dL (ref 6–20)
CO2: 30 mmol/L (ref 22–32)
CREATININE: 0.7 mg/dL (ref 0.44–1.00)
Calcium: 8.9 mg/dL (ref 8.9–10.3)
Chloride: 100 mmol/L — ABNORMAL LOW (ref 101–111)
GFR calc Af Amer: >60 mL/min (ref >60)
GLUCOSE: 102 mg/dL — AB (ref 65–99)
Potassium: 3.5 mmol/L (ref 3.5–5.1)
Sodium: 137 mmol/L (ref 135–145)
TOTAL PROTEIN: 7.4 g/dL (ref 6.5–8.1)

## 2016-10-26 LAB — CBC WITH DIFFERENTIAL/PLATELET
BASOS PCT: 1 %
Basophils Absolute: 0.1 10*3/uL (ref 0–0.1)
EOS ABS: 0.1 10*3/uL (ref 0–0.7)
Eosinophils Relative: 1 %
HCT: 37.1 % (ref 35.0–47.0)
Hemoglobin: 12.8 g/dL (ref 12.0–16.0)
LYMPHS ABS: 0.6 10*3/uL — AB (ref 1.0–3.6)
Lymphocytes Relative: 11 %
MCH: 30.9 pg (ref 26.0–34.0)
MCHC: 34.4 g/dL (ref 32.0–36.0)
MCV: 89.7 fL (ref 80.0–100.0)
MONOS PCT: 10 %
Monocytes Absolute: 0.6 10*3/uL (ref 0.2–0.9)
Neutro Abs: 4.3 10*3/uL (ref 1.4–6.5)
Neutrophils Relative %: 77 %
Platelets: 199 10*3/uL (ref 150–440)
RBC: 4.14 MIL/uL (ref 3.80–5.20)
RDW: 18.1 % — ABNORMAL HIGH (ref 11.5–14.5)
WBC: 5.7 10*3/uL (ref 3.6–11.0)

## 2016-10-26 NOTE — Progress Notes (Signed)
Patient has a colonoscopy for 11/29 by Dr. Jamal Collin.  November 16, 2016-scheduled for right lateral neck bx by Dr. Jamal Collin per pt

## 2016-10-26 NOTE — Progress Notes (Signed)
[place] OFFICE PROGRESS NOTE  Patient Care Team: Lynnell Jude, MD as PCP - General (Family Medicine) Seeplaputhur Robinette Haines, MD (General Surgery)  No matching staging information was found for the patient.   Oncology History   # SEP 2017- RECTAL CA mod diff adeno; STAGE III [no EUS; positive ~81m LN; Dr.sankar] CEA-7; Sep 21st START 5FU-RT  # Right thyroid mass/PET positive-   # smoker/ TIA [aug 2017- Plavix]   # Will order MMR/Mol testing on surgical resection  # Multiple family members-malignancy- await MMR on surgical resection specimen.      [place]   08/16/2016 Initial Diagnosis    [place]       INTERVAL HISTORY:  Rachel ORTEGO89y.o.  female patient with rectal cancer is Currently Just post chemoradiation approximately 2-3 weeks ago.  In the interim patient had evaluation with surgery; planned colonoscopy early December. Also plans to have a thyroid biopsy  Denies any diarrhea. Denies any blood in stools. No nausea or vomiting. No sores in the mouth. Appetite is fair. No significant rectal discomfort.   Otherwise no chest pain or shortness of breath or cough.   REVIEW OF SYSTEMS:  A complete 10 point review of system is done which is negative except mentioned above/history of present illness.   PAST MEDICAL HISTORY :  Past Medical History:  Diagnosis Date  . Body mass index (BMI) of 24.0-24.9 in adult   . Cardiovascular disease   . Current every day smoker    a. ~35 years - > has cut down to < 3/4 ppd  . Headache   . History of UTI   . Hypertension   . Midsternal chest pain    a. 05/2014 Stress Echo: Ex time: 8:09, ECG w/o acute changes, EF nl with possible mid and apical anterior HK-->cath recommended.  . [place]    07[place]  . Syncope and collapse     PAST SURGICAL HISTORY :   Past Surgical History:  Procedure Laterality Date   . ABDOMINAL HYSTERECTOMY    . BACK SURGERY    . CHOLECYSTECTOMY    . COLONOSCOPY WITH PROPOFOL N/A 08/15/2016   Procedure: COLONOSCOPY WITH PROPOFOL;  Surgeon: MLollie Sails MD;  [facility];  Service: Endoscopy;  Laterality: N/A;  . ERCP    . PARTIAL HYSTERECTOMY    . PORTACATH PLACEMENT N/A 08/18/2016   Procedure: INSERTION PORT-A-CATH;  Surgeon: SChristene Lye MD;  Location: [facility] ORS;  Service: General;  Laterality: N/A;    FAMILY HISTORY :  Mom- gall bladder ca; breast cancer- 733s mat grand ma- stomach cancer- 687s sblings- 2 no cancers; 2 daughters- one daughter? Cervical  Family History  Problem Relation Age of Onset  . Breast cancer Mother   . Hyperlipidemia Father   . Heart disease Paternal Grandmother   . Stroke Maternal Grandfather     SOCIAL HISTORY:   Social History  Substance Use Topics  . Smoking status: Former Smoker    Packs/day: 0.25    Years: 35.00    Types: Cigarettes    Quit date: 08/11/2016  . Smokeless tobacco: Never Used  . Alcohol use Not on file     Comment: occasional    ALLERGIES:  is allergic to prednisone.  MEDICATIONS:  Current Outpatient Prescriptions  Medication Sig Dispense Refill  . amLODipine (NORVASC) 10 MG tablet Take 1 tablet (10 mg total)  by mouth every evening. 30 tablet 0  . clopidogrel (PLAVIX) 75 MG tablet Take 75 mg by mouth daily.    . isosorbide mononitrate (IMDUR) 30 MG 24 hr tablet Take 1 tablet (30 mg total) by mouth daily. 90 tablet 3  . lidocaine-prilocaine (EMLA) cream Apply 1 application topically as needed. 30 g 4  . lisinopril (PRINIVIL,ZESTRIL) 10 MG tablet Take 10 mg by mouth daily.    . potassium chloride SA (K-DUR,KLOR-CON) 20 MEQ tablet 1 pill twice a day 30 tablet 3  . traMADol (ULTRAM) 50 MG tablet Take 50 mg by mouth every 6 (six) hours as needed for moderate pain.    . diphenoxylate-atropine (LOMOTIL) 2.5-0.025 MG tablet Take 1 tablet by mouth 4 (four) times daily as needed for diarrhea  or loose stools. Take it along with immodium (Patient not taking: Reported on 10/26/2016) 30 tablet 0  . nitroGLYCERIN (NITROSTAT) 0.4 MG SL tablet Place 1 tablet (0.4 mg total) under the tongue every 5 (five) minutes as needed for chest pain. (Patient not taking: Reported on 10/26/2016) 25 tablet 6  . ondansetron (ZOFRAN) 8 MG tablet Take 1 tablet (8 mg total) by mouth 2 (two) times daily as needed (Nausea or vomiting). (Patient not taking: Reported on 10/26/2016) 30 tablet 1  . prochlorperazine (COMPAZINE) 10 MG tablet Take 1 tablet (10 mg total) by mouth every 6 (six) hours as needed (Nausea or vomiting). (Patient not taking: Reported on 10/26/2016) 30 tablet 1   No current facility-administered medications for this visit.     PHYSICAL EXAMINATION: ECOG PERFORMANCE STATUS: 1 - Symptomatic but completely ambulatory  BP 124/84 (BP Location: Left Arm, Patient Position: Sitting)   Pulse 80   Temp 97.8 F (36.6 C) (Tympanic)   Resp 20   Ht _0  (1.676 m)   Wt 137 lb 14.4 oz (62.6 kg)   BMI 22.26 kg/m   Filed Weights   10/26/16 1435  Weight: 137 lb 14.4 oz (62.6 kg)    GENERAL: Well-nourished well-developed; Alert, no distress and comfortable.   With family.  EYES: no pallor or icterus OROPHARYNX: no thrush or ulceration; good dentition  NECK: supple, no masses felt LYMPH:  no palpable lymphadenopathy in the cervical, axillary or inguinal regions LUNGS: clear to auscultation and  No wheeze or crackles HEART/CVS: regular rate & rhythm and no murmurs; No lower extremity edema ABDOMEN:abdomen soft, non-tender and normal bowel sounds Musculoskeletal:no cyanosis of digits and no clubbing  PSYCH: alert & oriented x 3 with fluent speech NEURO: no focal motor/sensory deficits SKIN:  no rashes or significant lesions  LABORATORY DATA:  I have reviewed the data as listed    Component Value Date/Time   NA 137 10/26/2016 1400   NA 144 07/25/2014 0444   K 3.5 10/26/2016 1400   K 3.7  07/25/2014 0444   CL 100 (L) 10/26/2016 1400   CL 107 07/25/2014 0444   CO2 30 10/26/2016 1400   CO2 30 07/25/2014 0444   GLUCOSE 102 (H) 10/26/2016 1400   GLUCOSE 87 07/25/2014 0444   BUN 10 10/26/2016 1400   BUN 8 07/25/2014 0444   CREATININE 0.70 10/26/2016 1400   CREATININE 0.69 07/25/2014 0444   CALCIUM 8.9 10/26/2016 1400   CALCIUM 8.4 (L) 07/25/2014 0444   PROT 7.4 10/26/2016 1400   PROT 6.5 07/25/2014 0444   ALBUMIN 3.5 10/26/2016 1400   ALBUMIN 2.6 (L) 07/25/2014 0444   AST 15 10/26/2016 1400   AST 96 (H) 07/25/2014 0444  ALT 11 (L) 10/26/2016 1400   ALT 123 (H) 07/25/2014 0444   ALKPHOS 93 10/26/2016 1400   ALKPHOS 189 (H) 07/25/2014 0444   BILITOT 0.4 10/26/2016 1400   BILITOT 2.0 (H) 07/25/2014 0444   GFRNONAA >60 10/26/2016 1400   GFRNONAA >60 07/25/2014 0444   GFRAA >60 10/26/2016 1400   GFRAA >60 07/25/2014 0444    No results found for: SPEP, UPEP  Lab Results  Component Value Date   WBC 5.7 10/26/2016   NEUTROABS 4.3 10/26/2016   HGB 12.8 10/26/2016   HCT 37.1 10/26/2016   MCV 89.7 10/26/2016   PLT 199 10/26/2016      Chemistry      Component Value Date/Time   NA 137 10/26/2016 1400   NA 144 07/25/2014 0444   K 3.5 10/26/2016 1400   K 3.7 07/25/2014 0444   CL 100 (L) 10/26/2016 1400   CL 107 07/25/2014 0444   CO2 30 10/26/2016 1400   CO2 30 07/25/2014 0444   BUN 10 10/26/2016 1400   BUN 8 07/25/2014 0444   CREATININE 0.70 10/26/2016 1400   CREATININE 0.69 07/25/2014 0444      Component Value Date/Time   CALCIUM 8.9 10/26/2016 1400   CALCIUM 8.4 (L) 07/25/2014 0444   ALKPHOS 93 10/26/2016 1400   ALKPHOS 189 (H) 07/25/2014 0444   AST 15 10/26/2016 1400   AST 96 (H) 07/25/2014 0444   ALT 11 (L) 10/26/2016 1400   ALT 123 (H) 07/25/2014 0444   BILITOT 0.4 10/26/2016 1400   BILITOT 2.0 (H) 07/25/2014 0444       RADIOGRAPHIC STUDIES: I have personally reviewed the radiological images as listed and agreed with the findings in the  report. No results found.   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 Awaiting colo Nov 29th.   # thyroid nodule- positive on PET scan;  Bx on dec 9th. Discussed with Dr. Jamal Collin.  # follow up with me in mid jan 2018/labs/port flush.    Orders Placed This Encounter  Procedures  . CBC with Differential    Standing Status:   Future    Standing Expiration Date:   10/26/2017  . Comprehensive metabolic panel    Standing Status:   Future    Standing Expiration Date:   10/26/2017  . CEA    Standing Status:   Future    Standing Expiration Date:   10/26/2017   All questions were answered. The patient knows to call the clinic with any problems, questions or concerns.      Cammie Sickle, MD 10/26/2016 5:08 PM

## 2016-10-26 NOTE — Assessment & Plan Note (Addendum)
#   Rectal  Cancer- stage III- s/p neoadjuvant chemoradiation with 5-FU continuous infusion with radiation; recent exam- Improved response. Awaiting colo Nov 29th.   # thyroid nodule- positive on PET scan;  Bx on dec 9th. Discussed with Dr. Jamal Collin.  # follow up with me in mid jan 2018/labs/port flush.

## 2016-11-07 ENCOUNTER — Encounter: Payer: Self-pay | Admitting: *Deleted

## 2016-11-08 ENCOUNTER — Ambulatory Visit: Payer: BLUE CROSS/BLUE SHIELD | Admitting: Anesthesiology

## 2016-11-08 ENCOUNTER — Encounter
Admission: RE | Disposition: A | Discharge status: Home / Self Care | Payer: Self-pay | Source: Ambulatory Visit | Attending: General Surgery

## 2016-11-08 ENCOUNTER — Encounter: Payer: Self-pay | Admitting: *Deleted

## 2016-11-08 ENCOUNTER — Other Ambulatory Visit: Payer: Self-pay | Admitting: *Deleted

## 2016-11-08 ENCOUNTER — Ambulatory Visit
Admission: RE | Admit: 2016-11-08 | Discharge: 2016-11-08 | Disposition: A | Discharge status: Home / Self Care | Payer: BLUE CROSS/BLUE SHIELD | Source: Ambulatory Visit | Attending: General Surgery | Admitting: General Surgery

## 2016-11-08 DIAGNOSIS — D123 Benign neoplasm of transverse colon: Secondary | ICD-10-CM | POA: Insufficient documentation

## 2016-11-08 DIAGNOSIS — C20 Malignant neoplasm of rectum: Secondary | ICD-10-CM

## 2016-11-08 DIAGNOSIS — R52 Pain, unspecified: Secondary | ICD-10-CM | POA: Insufficient documentation

## 2016-11-08 DIAGNOSIS — I10 Essential (primary) hypertension: Secondary | ICD-10-CM | POA: Diagnosis not present

## 2016-11-08 DIAGNOSIS — Z87891 Personal history of nicotine dependence: Secondary | ICD-10-CM | POA: Insufficient documentation

## 2016-11-08 DIAGNOSIS — Z8673 Personal history of transient ischemic attack (TIA), and cerebral infarction without residual deficits: Secondary | ICD-10-CM | POA: Diagnosis not present

## 2016-11-08 DIAGNOSIS — R197 Diarrhea, unspecified: Secondary | ICD-10-CM | POA: Diagnosis not present

## 2016-11-08 DIAGNOSIS — E041 Nontoxic single thyroid nodule: Secondary | ICD-10-CM | POA: Diagnosis not present

## 2016-11-08 DIAGNOSIS — Z9221 Personal history of antineoplastic chemotherapy: Secondary | ICD-10-CM | POA: Insufficient documentation

## 2016-11-08 DIAGNOSIS — Z08 Encounter for follow-up examination after completed treatment for malignant neoplasm: Secondary | ICD-10-CM | POA: Diagnosis present

## 2016-11-08 DIAGNOSIS — Z85038 Personal history of other malignant neoplasm of large intestine: Secondary | ICD-10-CM | POA: Insufficient documentation

## 2016-11-08 DIAGNOSIS — I739 Peripheral vascular disease, unspecified: Secondary | ICD-10-CM | POA: Insufficient documentation

## 2016-11-08 DIAGNOSIS — Z923 Personal history of irradiation: Secondary | ICD-10-CM | POA: Diagnosis not present

## 2016-11-08 DIAGNOSIS — K626 Ulcer of anus and rectum: Secondary | ICD-10-CM | POA: Insufficient documentation

## 2016-11-08 DIAGNOSIS — D124 Benign neoplasm of descending colon: Secondary | ICD-10-CM | POA: Insufficient documentation

## 2016-11-08 DIAGNOSIS — C775 Secondary and unspecified malignant neoplasm of intrapelvic lymph nodes: Principal | ICD-10-CM

## 2016-11-08 DIAGNOSIS — K6289 Other specified diseases of anus and rectum: Secondary | ICD-10-CM | POA: Insufficient documentation

## 2016-11-08 DIAGNOSIS — I2510 Atherosclerotic heart disease of native coronary artery without angina pectoris: Secondary | ICD-10-CM | POA: Diagnosis not present

## 2016-11-08 DIAGNOSIS — Z7902 Long term (current) use of antithrombotics/antiplatelets: Secondary | ICD-10-CM | POA: Insufficient documentation

## 2016-11-08 DIAGNOSIS — Z79899 Other long term (current) drug therapy: Secondary | ICD-10-CM | POA: Insufficient documentation

## 2016-11-08 DIAGNOSIS — G893 Neoplasm related pain (acute) (chronic): Secondary | ICD-10-CM

## 2016-11-08 HISTORY — PX: COLONOSCOPY WITH PROPOFOL: SHX5780

## 2016-11-08 SURGERY — COLONOSCOPY WITH PROPOFOL
Anesthesia: General

## 2016-11-08 MED ORDER — LIDOCAINE 2% (20 MG/ML) 5 ML SYRINGE
INTRAMUSCULAR | Status: DC | PRN
  Administered 2016-11-08: 50 mg via INTRAVENOUS

## 2016-11-08 MED ORDER — FENTANYL 25 MCG/HR TD PT72: 25.0000 ug | MEDICATED_PATCH | TRANSDERMAL | 0 refills | Status: DC

## 2016-11-08 MED ORDER — SODIUM CHLORIDE 0.9 % IV SOLN
INTRAVENOUS | Status: DC
  Administered 2016-11-08: 10:00:00 via INTRAVENOUS

## 2016-11-08 MED ORDER — GLYCOPYRROLATE 0.2 MG/ML IJ SOLN
INTRAMUSCULAR | Status: DC | PRN
  Administered 2016-11-08: 0.2 mg via INTRAVENOUS

## 2016-11-08 MED ORDER — MORPHINE SULFATE (PF) 4 MG/ML IV SOLN
2.0000 mg | INTRAVENOUS | Status: AC
  Administered 2016-11-08: 2 mg via INTRAVENOUS
  Filled 2016-11-08: qty 0.5

## 2016-11-08 MED ORDER — KETOROLAC TROMETHAMINE 30 MG/ML IJ SOLN
15.0000 mg | INTRAMUSCULAR | Status: AC
  Administered 2016-11-08: 15 mg via INTRAVENOUS
  Filled 2016-11-08: qty 1

## 2016-11-08 MED ORDER — PROPOFOL 500 MG/50ML IV EMUL
INTRAVENOUS | Status: DC | PRN
  Administered 2016-11-08: 125 ug/kg/min via INTRAVENOUS

## 2016-11-08 MED ORDER — PROPOFOL 10 MG/ML IV BOLUS
INTRAVENOUS | Status: DC | PRN
  Administered 2016-11-08: 60 mg via INTRAVENOUS
  Administered 2016-11-08: 50 mg via INTRAVENOUS

## 2016-11-08 NOTE — OR Nursing (Signed)
DR Jamal Collin  NOTIIFED OF PATIENT IN EXCRUCIATING RECTAL PAIN AT 1211PM. MD ORDERED TORADOL 15MG  IV AND MORPHINE 2MG  IV STAT

## 2016-11-08 NOTE — Op Note (Signed)
[facility] Gastroenterology Patient Name: Rachel [name] Procedure Date: 11/08/2016 10:40 AM MRN: [id number] Account #: [id number] Date of Birth: 29-Sep-1956 Admit Type: Outpatient Age: 60 Room: [facility] ENDO ROOM 1 Gender: Female Note Status: Finalized Procedure:            Colonoscopy Indications:          Post-operative assessment Providers:            [name]. Jamal Collin, MD Referring MD:         Forest Gleason Md, MD (Referring MD) Medicines:            General Anesthesia Complications:        No immediate complications. Procedure:            Pre-Anesthesia Assessment:                       - General anesthesia under the supervision of an                        anesthesiologist was determined to be medically                        necessary for this procedure based on review of the                        patient's medical history, medications, and prior                        anesthesia history.                       After obtaining informed consent, the colonoscope was                        passed under direct vision. Throughout the procedure,                        the patient's blood pressure, pulse, and oxygen                        saturations were monitored continuously. The                        Colonoscope was introduced through the anus and                        advanced to the the cecum, identified by the ileocecal                        valve. The colonoscopy was performed without                        difficulty. The patient tolerated the procedure fairly                        well. The quality of the bowel preparation was good. Findings:      The perianal exam [findings].      The digital rectal exam revealed a 5 cm (diameter) hard circumferential       rectal mass non mobile. The mass was circumferential. This appeared to       have had  minimal response to chemo/radiation.However the scope was       negotiated past this mass.      mass was cold  biopsied      Two sessile polyps were found in the distal transverse colon. The polyps       were 3 and 9 mm in size. These polyps were removed with a hot snare.       Resection and retrieval were complete.      The exam was otherwise without abnormality. Impression:           - Abnormal perianal exam.                       - Rectal mass.                       - Two 9 mm polyps in the distal transverse colon,                        removed with a hot snare. Resected and retrieved.                       - The examination was otherwise normal. Recommendation:       - Discharge patient to home.                       - Resume previous diet.                       - Continue present medications.                       - Return to my office in 1 week. Procedure Code(s):    --- Professional ---                       [phone], Colonoscopy, flexible; with removal of tumor(s),                        polyp(s), or other lesion(s) by snare technique Diagnosis Code(s):    --- Professional ---                       K62.89, Other specified diseases of anus and rectum                       D12.3, Benign neoplasm of transverse colon (hepatic                        flexure or splenic flexure)                       Z09, Encounter for follow-up examination after                        completed treatment for conditions other than malignant                        neoplasm CPT copyright 2016 American Medical Association. All rights reserved. The codes documented in this report are preliminary and upon coder review may  be revised to meet current compliance requirements. Christene Lye, MD 11/08/2016 11:39:09 AM This report has been signed electronically. Number of  Addenda: 0 Note Initiated On: 11/08/2016 10:40 AM Scope Withdrawal Time: 0 hours 23 minutes 9 seconds  Total Procedure Duration: 0 hours 45 minutes 23 seconds       [facility]

## 2016-11-08 NOTE — H&P (View-Only) (Signed)
Patient ID: ARIN CLOWER, female   DOB: 06/18/1956, 60 y.o.   MRN: [id number]  Chief Complaint  Patient presents with  . Follow-up    HPI Rachel [name] is a 60 y.o. female here today for follow up rectal cancer. She completed chemotherapy and radiation 2 weeks ago. Tolerated it well. However, patient states the rectal area is very sore and complains of diffuse pain throughout body. Complains of pain with bowel movements and diarrhea every few days. Denies rectal bleeding.  I have reviewed the history of present illness with the patient.  HPI  Past Medical History:  Diagnosis Date  . Body mass index (BMI) of 24.0-24.9 in adult   . Cardiovascular disease   . Current every day smoker    a. ~35 years - > has cut down to < 3/4 ppd  . Headache   . History of UTI   . Hypertension   . Midsternal chest pain    a. 05/2014 Stress Echo: Ex time: 8:09, ECG w/o acute changes, EF nl with possible mid and apical anterior HK-->cath recommended.  . Stroke ([place])    07/2016  . Syncope and collapse     Past Surgical History:  Procedure Laterality Date  . ABDOMINAL HYSTERECTOMY    . BACK SURGERY    . CHOLECYSTECTOMY    . COLONOSCOPY WITH PROPOFOL N/A 08/15/2016   Procedure: COLONOSCOPY WITH PROPOFOL;  Surgeon: Lollie Sails, MD;  Location[facility] ENDOSCOPY;  Service: Endoscopy;  Laterality: N/A;  . ERCP    . PARTIAL HYSTERECTOMY    . PORTACATH PLACEMENT N/A 08/18/2016   Procedure: INSERTION PORT-A-CATH;  Surgeon: Christene Lye, MD;  Location: [facility] ORS;  Service: General;  Laterality: N/A;    Family History  Problem Relation Age of Onset  . Breast cancer Mother   . Hyperlipidemia Father   . Heart disease Paternal Grandmother   . Stroke Maternal Grandfather     Social History Social History  Substance Use Topics  . Smoking status: Former Smoker    Packs/day: 0.25    Years: 35.00    Types: Cigarettes    Quit date: 08/11/2016  . Smokeless tobacco: Never Used  . Alcohol use Not  on file     Comment: occasional    Allergies  Allergen Reactions  . Prednisone Other (See Comments)    BLISTERS IN MOUTH AND ON TONGUE    Current Outpatient Prescriptions  Medication Sig Dispense Refill  . amLODipine (NORVASC) 10 MG tablet Take 1 tablet (10 mg total) by mouth every evening. 30 tablet 0  . clopidogrel (PLAVIX) 75 MG tablet Take 75 mg by mouth daily.    . diphenoxylate-atropine (LOMOTIL) 2.5-0.025 MG tablet Take 1 tablet by mouth 4 (four) times daily as needed for diarrhea or loose stools. Take it along with immodium 30 tablet 0  . isosorbide mononitrate (IMDUR) 30 MG 24 hr tablet Take 1 tablet (30 mg total) by mouth daily. 90 tablet 3  . lidocaine-prilocaine (EMLA) cream Apply 1 application topically as needed. 30 g 4  . lisinopril (PRINIVIL,ZESTRIL) 10 MG tablet Take 10 mg by mouth daily.    . metoprolol tartrate (LOPRESSOR) 25 MG tablet Take by mouth.    . nitroGLYCERIN (NITROSTAT) 0.4 MG SL tablet Place 1 tablet (0.4 mg total) under the tongue every 5 (five) minutes as needed for chest pain. 25 tablet 6  . ondansetron (ZOFRAN) 8 MG tablet Take 1 tablet (8 mg total) by mouth 2 (two) times daily as  needed (Nausea or vomiting). 30 tablet 1  . oxyCODONE (OXY IR/ROXICODONE) 5 MG immediate release tablet Take 1 tablet (5 mg total) by mouth every 4 (four) hours as needed for moderate pain. 30 tablet 0  . potassium chloride SA (K-DUR,KLOR-CON) 20 MEQ tablet 1 pill twice a day 30 tablet 3  . prochlorperazine (COMPAZINE) 10 MG tablet Take 1 tablet (10 mg total) by mouth every 6 (six) hours as needed (Nausea or vomiting). 30 tablet 1  . traMADol (ULTRAM) 50 MG tablet Take 50 mg by mouth every 6 (six) hours as needed for moderate pain.     No current facility-administered medications for this visit.     Review of Systems Review of Systems  Constitutional: Negative.   Respiratory: Negative.   Cardiovascular: Negative.     Blood pressure 124/72, pulse 76, resp. rate 14,  height 5\' 6"  (1.676 m), weight 138 lb (62.6 kg).  Physical Exam Physical Exam  Constitutional: She is oriented to person, place, and time. She appears well-developed and well-nourished.  Eyes: Conjunctivae are normal. No scleral icterus.  Neck: Neck supple.  Cardiovascular: Normal rate, regular rhythm and normal heart sounds.   Pulmonary/Chest: Effort normal and breath sounds normal.  Port a cath placement. Stable  Abdominal: Soft. Bowel sounds are normal. There is no hepatomegaly. There is tenderness in the right lower quadrant, suprapubic area and left lower quadrant.  Genitourinary: Rectal exam shows tenderness. Rectal exam shows no mass.  Genitourinary Comments: Digital exam performed with Xylocaine jelly. Can not palpate rectal lesion  Lymphadenopathy:    She has no cervical adenopathy.  Neurological: She is alert and oriented to person, place, and time.  Skin: Skin is warm and dry.    Data Reviewed Previous notes and PET scan and recent labs Assessment    Rectal cancer, completed chemo and radiation 2 weeks ago Active right thyroid nodule on PET scan    Plan    Repeat colonoscopy- unable to visualize colon past rectum due to obstructing mass  Right thyroid nodule biopsy to evaluate for malignancy    This information has been scribed by [name] CMA.   Shizue Kaseman G 10/24/2016, 9:38 AM

## 2016-11-08 NOTE — Interval H&P Note (Signed)
History and Physical Interval Note:  11/08/2016 10:33 AM  Rachel [name]  has presented today for surgery, with the diagnosis of CA RECTUM  The various methods of treatment have been discussed with the patient and family. After consideration of risks, benefits and other options for treatment, the patient has consented to  Procedure(s): COLONOSCOPY WITH PROPOFOL (N/A) as a surgical intervention .  The patient's history has been reviewed, patient examined, no change in status, stable for surgery.  I have reviewed the patient's chart and labs.  Questions were answered to the patient's satisfaction.     Jeramie Scogin G

## 2016-11-08 NOTE — Anesthesia Preprocedure Evaluation (Signed)
Anesthesia Evaluation  Patient identified by MRN, date of birth, ID band Patient awake    Reviewed: Allergy & Precautions, H&P , NPO status , Patient's Chart, lab work & pertinent test results, reviewed documented beta blocker date and time   History of Anesthesia Complications Negative for: history of anesthetic complications  Airway Mallampati: II  TM Distance: >3 FB Neck ROM: full    Dental  (+) Edentulous Upper, Edentulous Lower, Upper Dentures, Lower Dentures   Pulmonary neg shortness of breath, neg sleep apnea, neg COPD, neg recent URI, Current Smoker,           Cardiovascular Exercise Tolerance: Good hypertension, (-) angina+ CAD and + Peripheral Vascular Disease  (-) Past MI, (-) Cardiac Stents and (-) CABG (-) dysrhythmias (-) Valvular Problems/Murmurs     Neuro/Psych neg Seizures CVA, No Residual Symptoms negative psych ROS   GI/Hepatic negative GI ROS, Neg liver ROS,   Endo/Other  negative endocrine ROS  Renal/GU negative Renal ROS  negative genitourinary   Musculoskeletal   Abdominal   Peds  Hematology negative hematology ROS (+)   Anesthesia Other Findings Past Medical History: No date: Body mass index (BMI) of 24.0-24.9 in adult No date: Cardiovascular disease No date: Current every day smoker     Comment: a. ~35 years - > has cut down to < 3/4 ppd No date: Headache No date: History of UTI No date: Hypertension No date: Midsternal chest pain     Comment: a. 05/2014 Stress Echo: Ex time: 8:09, ECG w/o               acute changes, EF nl with possible mid and               apical anterior HK-->cath recommended. No date: Stroke [facility])     Comment: 07/2016 No date: Syncope and collapse   Reproductive/Obstetrics negative OB ROS                             Anesthesia Physical Anesthesia Plan  ASA: II  Anesthesia Plan: General   Post-op Pain Management:    Induction:    Airway Management Planned:   Additional Equipment:   Intra-op Plan:   Post-operative Plan:   Informed Consent: I have reviewed the patients History and Physical, chart, labs and discussed the procedure including the risks, benefits and alternatives for the proposed anesthesia with the patient or authorized representative who has indicated his/her understanding and acceptance.   Dental Advisory Given  Plan Discussed with: Anesthesiologist, CRNA and Surgeon  Anesthesia Plan Comments:         Anesthesia Quick Evaluation

## 2016-11-08 NOTE — Transfer of Care (Signed)
Immediate Anesthesia Transfer of Care Note  Patient: Rachel [name]  Procedure(s) Performed: Procedure(s): COLONOSCOPY WITH PROPOFOL (N/A)  Patient Location: Endoscopy Unit  Anesthesia Type:General  Level of Consciousness: awake  Airway & Oxygen Therapy: Patient connected to nasal cannula oxygen  Post-op Assessment: Post -op Vital signs reviewed and stable  Post vital signs: stable  Last Vitals:  Vitals:   11/08/16 0942 11/08/16 1140  BP: 115/84 (!) 135/94  Pulse: 85 90  Resp: 16 15  Temp: 36.8 C (!) 36 C    Last Pain:  Vitals:   11/08/16 1140  TempSrc: Tympanic  PainSc:       Patients Stated Pain Goal: 4 ([id number])  Complications: No apparent anesthesia complications

## 2016-11-09 ENCOUNTER — Encounter: Payer: Self-pay | Admitting: General Surgery

## 2016-11-09 ENCOUNTER — Other Ambulatory Visit: Payer: Self-pay

## 2016-11-09 DIAGNOSIS — C20 Malignant neoplasm of rectum: Secondary | ICD-10-CM

## 2016-11-09 LAB — SURGICAL PATHOLOGY

## 2016-11-09 NOTE — Progress Notes (Signed)
The patient is scheduled for a CT abdomen pelvis with contrast at [facility] on 11/15/16 at 3:00 pm. She will arrive at 2:45 pm and have nothing to eat or drink for 4 hours prior. She will pick up a prep kit for this procedure. The patient is aware of date, time, and instructions.

## 2016-11-10 NOTE — Anesthesia Postprocedure Evaluation (Signed)
Anesthesia Post Note  Patient: MARKAYLA DIVIN  Procedure(s) Performed: Procedure(s) (LRB): COLONOSCOPY WITH PROPOFOL (N/A)  Patient location during evaluation: Endoscopy Anesthesia Type: General Level of consciousness: awake and alert Pain management: pain level controlled Vital Signs Assessment: post-procedure vital signs reviewed and stable Respiratory status: spontaneous breathing, nonlabored ventilation, respiratory function stable and patient connected to nasal cannula oxygen Cardiovascular status: blood pressure returned to baseline and stable Postop Assessment: no signs of nausea or vomiting Anesthetic complications: no    Last Vitals:  Vitals:   11/08/16 1230 11/08/16 1240  BP: (!) 108/92 136/85  Pulse: 76 70  Resp: 18 13  Temp:      Last Pain:  Vitals:   11/09/16 1101  TempSrc:   PainSc: 0-No pain                 Martha Clan

## 2016-11-15 ENCOUNTER — Ambulatory Visit
Admission: RE | Admit: 2016-11-15 | Discharge: 2016-11-15 | Disposition: A | Discharge status: Home / Self Care | Payer: BLUE CROSS/BLUE SHIELD | Source: Ambulatory Visit | Attending: General Surgery | Admitting: General Surgery

## 2016-11-15 DIAGNOSIS — R590 Localized enlarged lymph nodes: Secondary | ICD-10-CM | POA: Diagnosis not present

## 2016-11-15 DIAGNOSIS — C20 Malignant neoplasm of rectum: Secondary | ICD-10-CM | POA: Diagnosis present

## 2016-11-15 HISTORY — DX: Malignant neoplasm of rectum: C20

## 2016-11-15 MED ORDER — IOPAMIDOL (ISOVUE-300) INJECTION 61%
85.0000 mL | Freq: Once | INTRAVENOUS | Status: AC | PRN
  Administered 2016-11-15: 85 mL via INTRAVENOUS

## 2016-11-16 ENCOUNTER — Encounter: Payer: Self-pay | Admitting: General Surgery

## 2016-11-16 ENCOUNTER — Ambulatory Visit (INDEPENDENT_AMBULATORY_CARE_PROVIDER_SITE_OTHER): Payer: BLUE CROSS/BLUE SHIELD | Admitting: General Surgery

## 2016-11-16 ENCOUNTER — Inpatient Hospital Stay: Payer: Self-pay

## 2016-11-16 VITALS — BP 118/68 | HR 66 | Resp 12 | Ht 66.0 in | Wt 139.0 lb

## 2016-11-16 DIAGNOSIS — C20 Malignant neoplasm of rectum: Secondary | ICD-10-CM

## 2016-11-16 DIAGNOSIS — E041 Nontoxic single thyroid nodule: Secondary | ICD-10-CM

## 2016-11-16 HISTORY — PX: BIOPSY THYROID: PRO38

## 2016-11-16 MED ORDER — POLYETHYLENE GLYCOL 3350 17 GM/SCOOP PO POWD: ORAL | 0 refills | Status: DC

## 2016-11-16 MED ORDER — METRONIDAZOLE 500 MG PO TABS: ORAL_TABLET | ORAL | 0 refills | Status: AC

## 2016-11-16 MED ORDER — NEOMYCIN SULFATE 500 MG PO TABS: ORAL_TABLET | ORAL | 0 refills | Status: AC

## 2016-11-16 NOTE — Patient Instructions (Addendum)
The patient is aware to call back for any questions or concerns.  Colostomy, Adult, Care After Refer to this sheet in the next few weeks. These instructions provide you with information about caring for yourself after your procedure. Your health care provider may also give you more specific instructions. Your treatment has been planned according to current medical practices, but problems sometimes occur. Call your health care provider if you have any problems or questions after your procedure. What can I expect after the procedure? After the procedure, it is common to have:  Swelling at the opening that was created during the procedure (stoma).  Slight bleeding around the stoma.  Redness around the stoma. Follow these instructions at home: Activity  Rest as needed while the stoma area heals.  Return to your normal activities as told by your health care provider. Ask your health care provider what activities are safe for you.  Avoid strenuous activity and abdominal exercises for 3 weeks or for as long as told by your health care provider.  Do not lift anything that is heavier than 10 lb (4.5 kg). Incision care  Follow instructions from your health care provider about how to take care of your incision. Make sure you:  Wash your hands with soap and water before you change your bandage (dressing). If soap and water are not available, use hand sanitizer.  Change your dressing as told by your health care provider.  Leave stitches (sutures), skin glue, or adhesive strips in place. These skin closures may need to stay in place for 2 weeks or longer. If adhesive strip edges start to loosen and curl up, you may trim the loose edges. Do not remove adhesive strips completely unless your health care provider tells you to do that. Stoma Care  Keep the stoma area clean.  Clean and dry the skin around the stoma each time you change the colostomy bag. To clean the stoma area:  Use warm water and  only use cleansers that are recommended by your health care provider.  Rinse the stoma area with plain water.  Dry the area well.  Use stoma powder or ointment on your skin only as told by your health care provider. Do not use any other powders, gels, wipes, or creams on your skin.  Check the stoma area every day for signs of infection. Check for:  More redness, swelling, or pain.  More fluid or blood.  Pus or warmth.  Measure the stoma opening regularly and record the size. Watch for changes. Share this information with your health care provider. Bathing  Do not take baths, swim, or use a hot tub until your health care provider approves. Ask your health care provider if you can take showers. You may be able to shower with or without the colostomy bag in place. If you bathe with the bag on, dry the bag afterward.  Avoid using harsh or oily soaps when you bathe. Colostomy Bag Care  Follow instructions from your health care provider about how to empty or change the colostomy bag.  Keep colostomy supplies with you at all times.  Store all supplies in a cool, dry place.  Empty the colostomy bag:  Whenever it is one-third to one-half full.  At bedtime.  Replace the bag every 2-4 days or as told by your health care provider. Driving  Do not drive for 24 hours if you received a sedative.  Do not drive or operate heavy machinery while taking prescription pain medicine. General  instructions  Follow instructions from your health care provider about eating or drinking restrictions.  Take over-the-counter and prescription medicines only as told by your health care provider.  Avoid wearing clothes that are tight directly over your stoma.  Do not use any tobacco products, such as cigarettes, chewing tobacco, and e-cigarettes. If you need help quitting, ask your health care provider.  (Women) Ask your health care provider about becoming pregnant and about using birth control.  Medicines may not be absorbed normally after the procedure.  Keep all follow-up visits as told by your health care provider. This is important. Contact a health care provider if:  You are having trouble caring for your stoma or changing the colostomy bag.  You feel nauseous or you vomit.  You have a fever.  You havemore redness, swelling, or pain at the site of your stoma or around your anus.  You have more fluid or blood coming from your stoma or your anus.  Your stoma area feels warm to the touch.  You have pus coming from your stoma.  You notice a change in the size or appearance of the stoma.  You have abdominal pain, bloating, pressure, or cramping.  Your have stool more often or less often than your health care provider tells you to expect.  You are not making much urine. This may be a sign of dehydration. Get help right away if:  Your abdominal pain does not go away or it becomes severe.  You keep vomiting.  Your stool is not draining through the stoma.  You have chest pain or an irregular heartbeat. This information is not intended to replace advice given to you by your health care provider. Make sure you discuss any questions you have with your health care provider. Document Released: 04/19/2011 Document Revised: 04/06/2016 Document Reviewed: 08/10/2015 Elsevier Interactive Patient Education  2017 [employer].

## 2016-11-16 NOTE — Progress Notes (Addendum)
Patient ID: Rachel [name], female   DOB: 1956/01/30, 60 y.o.   MRN: [id number]  Chief Complaint  Patient presents with  . Follow-up    thyroid    HPI Rachel [name] is a 60 y.o. female.  Here today for follow up thyroid nodule and possible needle aspiration. She is also following up from recent CT scan and colonoscopy. She is having a lot of pain in rectum especially with bowel movement Currently on Fentanyl patch with Tramadol prn. I have reviewed the history of present illness with the patient. HPI  Past Medical History:  Diagnosis Date  . Body mass index (BMI) of 24.0-24.9 in adult   . Cardiovascular disease   . Current every day smoker    a. ~35 years - > has cut down to < 3/4 ppd  . Headache   . History of UTI   . Hypertension   . Midsternal chest pain    a. 05/2014 Stress Echo: Ex time: 8:09, ECG w/o acute changes, EF nl with possible mid and apical anterior HK-->cath recommended.  . Rectal cancer ([place]) 2017  . Stroke ([place])    07/2016  . Syncope and collapse     Past Surgical History:  Procedure Laterality Date  . ABDOMINAL HYSTERECTOMY    . BACK SURGERY    . CHOLECYSTECTOMY    . COLONOSCOPY WITH PROPOFOL N/A 08/15/2016   Procedure: COLONOSCOPY WITH PROPOFOL;  Surgeon: Lollie Sails, MD;  Location: [facility] ENDOSCOPY;  Service: Endoscopy;  Laterality: N/A;  . COLONOSCOPY WITH PROPOFOL N/A 11/08/2016   Procedure: COLONOSCOPY WITH PROPOFOL;  Surgeon: Christene Lye, MD;  Location: [facility] ENDOSCOPY;  Service: Endoscopy;  Laterality: N/A;  . ERCP    . PARTIAL HYSTERECTOMY    . PORTACATH PLACEMENT N/A 08/18/2016   Procedure: INSERTION PORT-A-CATH;  Surgeon: Christene Lye, MD;  Location: [facility] ORS;  Service: General;  Laterality: N/A;    Family History  Problem Relation Age of Onset  . Breast cancer Mother   . Hyperlipidemia Father   . Heart disease Paternal Grandmother   . Stroke Maternal Grandfather     Social History Social History  Substance Use  Topics  . Smoking status: Former Smoker    Packs/day: 0.25    Years: 35.00    Types: Cigarettes    Quit date: 08/11/2016  . Smokeless tobacco: Never Used  . Alcohol use No     Comment: occasional    Allergies  Allergen Reactions  . Prednisone Other (See Comments)    BLISTERS IN MOUTH AND ON TONGUE    Current Outpatient Prescriptions  Medication Sig Dispense Refill  . amLODipine (NORVASC) 10 MG tablet Take 1 tablet (10 mg total) by mouth every evening. 30 tablet 0  . clopidogrel (PLAVIX) 75 MG tablet Take 75 mg by mouth daily.    . diphenoxylate-atropine (LOMOTIL) 2.5-0.025 MG tablet Take 1 tablet by mouth 4 (four) times daily as needed for diarrhea or loose stools. Take it along with immodium 30 tablet 0  . fentaNYL (DURAGESIC - DOSED MCG/HR) 25 MCG/HR patch Place 1 patch (25 mcg total) onto the skin every 3 (three) days. 5 patch 0  . isosorbide mononitrate (IMDUR) 30 MG 24 hr tablet Take 1 tablet (30 mg total) by mouth daily. 90 tablet 3  . lidocaine-prilocaine (EMLA) cream Apply 1 application topically as needed. 30 g 4  . lisinopril (PRINIVIL,ZESTRIL) 10 MG tablet Take 10 mg by mouth daily.    . nitroGLYCERIN (NITROSTAT) 0.4 MG  SL tablet Place 1 tablet (0.4 mg total) under the tongue every 5 (five) minutes as needed for chest pain. 25 tablet 6  . ondansetron (ZOFRAN) 8 MG tablet Take 1 tablet (8 mg total) by mouth 2 (two) times daily as needed (Nausea or vomiting). 30 tablet 1  . potassium chloride SA (K-DUR,KLOR-CON) 20 MEQ tablet 1 pill twice a day 30 tablet 3  . prochlorperazine (COMPAZINE) 10 MG tablet Take 1 tablet (10 mg total) by mouth every 6 (six) hours as needed (Nausea or vomiting). 30 tablet 1  . traMADol (ULTRAM) 50 MG tablet Take 50 mg by mouth every 6 (six) hours as needed for moderate pain.    Derrill Memo ON 11/22/2016] metroNIDAZOLE (FLAGYL) 500 MG tablet Take one (1) tablet at 6 pm and one (1) tablet at 11 pm the evening prior to surgery. 2 tablet 0  . [START ON  11/22/2016] neomycin (MYCIFRADIN) 500 MG tablet Take two (2) tablets at 6 pm and two (2) tablets at 11 pm the evening prior to surgery. 4 tablet 0  . polyethylene glycol powder (GLYCOLAX/MIRALAX) powder 255 grams one bottle for bowel prep 255 g 0   No current facility-administered medications for this visit.     Review of Systems Review of Systems  Constitutional: Negative.   Respiratory: Negative.   Cardiovascular: Negative.     Blood pressure 118/68, pulse 66, resp. rate 12, height 5\' 6"  (1.676 m), weight 139 lb (63 kg).  Physical Exam Physical Exam  Constitutional: She is oriented to person, place, and time. She appears well-developed and well-nourished.  Eyes: Conjunctivae are normal. No scleral icterus.  Neck: Neck supple. Thyroid mass present.    Cardiovascular: Normal rate, regular rhythm and normal heart sounds.   Pulmonary/Chest: Effort normal and breath sounds normal.  Abdominal: Soft. Bowel sounds are normal. She exhibits no distension and no mass. There is no tenderness.  Lymphadenopathy:    She has no cervical adenopathy.  Neurological: She is alert and oriented to person, place, and time.  Skin: Skin is warm and dry.  Psychiatric: Her behavior is normal.    Data Reviewed  progress notes, colonoscopy findings.CT abd/pelvis US of thyroid shows > 3cm heterogeneous mass in right thyroid lobe. Small cyst in isthmus on right and one in left lobe. 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 The rectal mass appears very hard and fixed. Very tender. It only allows one fingerbreadth opening. CT scan reveals moderate edema in perirectal area. No fixity to sacrum noted.  Discussed fully with pt. She more than likely will need APR once the edema clears. In the interim she will benefit with colostomy - this will lessen pain.   Plan    Above discussed with pt and she is agreeable to colostomy. Aware that at a lter time resection of rectum will be done Patient's surgery has been scheduled for 11-23-16 at [facility]. This patient has been asked to discontinue Plavix 5 days prior to procedure and begin an 81 mg aspirin once Plavix is stopped. Patient has also been asked to complete a bowel prep with antibiotics.      This information has been scribed by Karie Fetch RN, BSN,BC.  [name],[name] 11/16/2016, 10:42 AM

## 2016-11-20 ENCOUNTER — Other Ambulatory Visit: Payer: BLUE CROSS/BLUE SHIELD

## 2016-11-20 ENCOUNTER — Encounter
Admission: RE | Admit: 2016-11-20 | Discharge: 2016-11-20 | Disposition: A | Discharge status: Home / Self Care | Payer: BLUE CROSS/BLUE SHIELD | Source: Ambulatory Visit | Attending: General Surgery | Admitting: General Surgery

## 2016-11-20 HISTORY — DX: Unspecified osteoarthritis, unspecified site: M19.90

## 2016-11-20 HISTORY — DX: Presence of other vascular implants and grafts: Z95.828

## 2016-11-20 LAB — SURGICAL PCR SCREEN
MRSA, PCR: NEGATIVE
Staphylococcus aureus: NEGATIVE

## 2016-11-20 NOTE — Consult Note (Signed)
[place] Nurse requested for preoperative stoma site marking  Discussed surgical procedure and stoma creation with patient and family.  Explained role of the [place] nurse team.  Provided the patient with educational booklet/DVD and provided samples of pouching options.  Answered patient and family questions.   Examined patient lying, sitting, and standing in order to place the marking in the patient's visual field, away from any creases or abdominal contour issues and within the rectus muscle.  Attempted to mark below the patient's belt line.   Marked for colostomy in the LLQ  3 cm to the left of the umbilicus and  4 cm above/below the umbilicus, creasing noted at umbilicus, marked slightly lower to avoid this area.    Patient's abdomen cleansed with CHG wipes at site markings, allowed to air dry prior to marking.Covered mark with thin film transparent dressing to preserve mark until date of surgery. Skin is very moist and thin film did not stay in place  around the edges.  Given marker to darken the mark if it begins to fade.  Surgery is in three days.   [place] Nurse team will follow up with patient after surgery for continue ostomy care and teaching.   Domenic Moras RN BSN [place] Pager [phone]

## 2016-11-20 NOTE — Patient Instructions (Signed)
  Your procedure is scheduled on:11/23/16 Report to Day Surgery.[facility] SECOND FLOOR To find out your arrival time please call [phone] between 1PM - 3PM on12/13/17  Remember: Instructions that are not followed completely may result in serious medical risk, up to and including death, or upon the discretion of your surgeon and anesthesiologist your surgery may need to be rescheduled.    _X___ 1. Do not eat food or drink liquids after midnight. No gum chewing or hard candies.     ____ 2. No Alcohol for 24 hours before or after surgery.   ____ 3. Do Not Smoke For 24 Hours Prior to Your Surgery.   ____ 4. Bring all medications with you on the day of surgery if instructed.    _X___ 5. Notify your doctor if there is any change in your medical condition     (cold, fever, infections).       Do not wear jewelry, make-up, hairpins, clips or nail polish.  Do not wear lotions, powders, or perfumes. You may wear deodorant.  Do not shave 48 hours prior to surgery. Men may shave face and neck.  Do not bring valuables to the hospital.    [facility] is not responsible for any belongings or valuables.               Contacts, dentures or bridgework may not be worn into surgery.  Leave your suitcase in the car. After surgery it may be brought to your room.  For patients admitted to the hospital, discharge time is determined by your                treatment team.   Patients discharged the day of surgery will not be allowed to drive home.   Please read over the following fact sheets that you were given:   MRSA Information   ____ Take these medicines the morning of surgery with A SIP OF WATER:    1. NONE  2.   3.   4.  5.  6.  ____ Fleet Enema (as directed)   _X___ Use CHG Soap as directed  ____ Use inhalers on the day of surgery  ____ Stop metformin 2 days prior to surgery    ____ Take 1/2 of usual insulin dose the night before surgery and none on the morning of surgery.    ____ Stop Coumadin/Plavix/aspirin on       PLAVIX ALREADY STOPPED. NEEDS TO START ASPIRIN 70 MG ____ Stop Anti-inflammatories on   ____ Stop supplements until after surgery.    ____ Bring C-Pap to the hospital.

## 2016-11-22 ENCOUNTER — Encounter: Payer: Self-pay | Admitting: Radiation Oncology

## 2016-11-22 ENCOUNTER — Ambulatory Visit
Admission: RE | Admit: 2016-11-22 | Discharge: 2016-11-22 | Disposition: A | Discharge status: Home / Self Care | Payer: BLUE CROSS/BLUE SHIELD | Source: Ambulatory Visit | Attending: Radiation Oncology | Admitting: Radiation Oncology

## 2016-11-22 VITALS — BP 114/79 | HR 82 | Temp 96.5°F | Resp 20 | Wt 138.1 lb

## 2016-11-22 DIAGNOSIS — K6289 Other specified diseases of anus and rectum: Secondary | ICD-10-CM | POA: Diagnosis not present

## 2016-11-22 DIAGNOSIS — Z9221 Personal history of antineoplastic chemotherapy: Secondary | ICD-10-CM | POA: Insufficient documentation

## 2016-11-22 DIAGNOSIS — Z923 Personal history of irradiation: Secondary | ICD-10-CM | POA: Diagnosis not present

## 2016-11-22 DIAGNOSIS — C20 Malignant neoplasm of rectum: Secondary | ICD-10-CM | POA: Diagnosis not present

## 2016-11-22 NOTE — Progress Notes (Signed)
Radiation Oncology Follow up Note  Name: Rachel [name]   Date:   11/22/2016 MRN:  [id number] DOB: [birth year]    This 60 y.o. female presents to the clinic today for one-month follow-up status post concurrent chemoradiation for locally advanced rectal cancer.  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 CT scan demonstrated probable direct extension into the mesial rectum with suspicious perirectal and sigmoid mesocolon lymph nodes. She is seen today in routine follow-up and is doing fair she continues to have pain is narcotic dependent.. Surgeon is planning a diverting colostomy to be followed by APR. She scheduled for surgery tomorrow.  COMPLICATIONS OF TREATMENT: none  FOLLOW UP COMPLIANCE: keeps appointments   PHYSICAL EXAM:  BP 114/79   Pulse 82   Temp (!) 96.5 F (35.8 C)   Resp 20   Wt 138 lb 1.9 oz (62.6 kg)   BMI 22.29 kg/m  Well-developed well-nourished patient in NAD. HEENT reveals PERLA, EOMI, discs not visualized.  Oral cavity is clear. No oral mucosal lesions are identified. Neck is clear without evidence of cervical or supraclavicular adenopathy. Lungs are clear to A&P. Cardiac examination is essentially unremarkable with regular rate and rhythm without murmur rub or thrill. Abdomen is benign with no organomegaly or masses noted. Motor sensory and DTR levels are equal and symmetric in the upper and lower extremities. Cranial nerves II through XII are grossly intact. Proprioception is intact. No peripheral adenopathy or edema is identified. No motor or sensory levels are noted. Crude visual fields are within normal range.  RADIOLOGY RESULTS: Recent CT scan is reviewed compatible with the above-stated findings CT scan shows moderate edema no direct evidence of invasion into the sacral  plexus.  PLAN: At this time patient will proceed with diverting colostomy to be followed by APR. My worry is she may have sacral sacral plexus involvement causing significant narcotic dependent pain. I will see her after her surgery about 3 months time. Patient family know to call sooner with any concerns.  I would like to take this opportunity to thank you for allowing me to participate in the care of your patient.Armstead Peaks., MD

## 2016-11-23 ENCOUNTER — Inpatient Hospital Stay: Payer: BLUE CROSS/BLUE SHIELD | Admitting: Certified Registered"

## 2016-11-23 ENCOUNTER — Telehealth: Payer: Self-pay | Admitting: *Deleted

## 2016-11-23 ENCOUNTER — Encounter: Payer: Self-pay | Admitting: *Deleted

## 2016-11-23 ENCOUNTER — Encounter
Admission: RE | Disposition: A | Discharge status: Home / Self Care | Payer: Self-pay | Source: Ambulatory Visit | Attending: General Surgery

## 2016-11-23 ENCOUNTER — Inpatient Hospital Stay
Admission: RE | Admit: 2016-11-23 | Discharge: 2016-11-26 | DRG: 331 | Disposition: A | Discharge status: Home / Self Care | Payer: BLUE CROSS/BLUE SHIELD | Source: Ambulatory Visit | Attending: General Surgery | Admitting: General Surgery

## 2016-11-23 DIAGNOSIS — E041 Nontoxic single thyroid nodule: Secondary | ICD-10-CM | POA: Diagnosis present

## 2016-11-23 DIAGNOSIS — Z87891 Personal history of nicotine dependence: Secondary | ICD-10-CM

## 2016-11-23 DIAGNOSIS — Z888 Allergy status to other drugs, medicaments and biological substances status: Secondary | ICD-10-CM | POA: Diagnosis not present

## 2016-11-23 DIAGNOSIS — I10 Essential (primary) hypertension: Secondary | ICD-10-CM | POA: Diagnosis present

## 2016-11-23 DIAGNOSIS — Z7902 Long term (current) use of antithrombotics/antiplatelets: Secondary | ICD-10-CM | POA: Diagnosis not present

## 2016-11-23 DIAGNOSIS — Z79899 Other long term (current) drug therapy: Secondary | ICD-10-CM | POA: Diagnosis not present

## 2016-11-23 DIAGNOSIS — C20 Malignant neoplasm of rectum: Principal | ICD-10-CM | POA: Diagnosis present

## 2016-11-23 HISTORY — PX: LAPAROSCOPY: SHX197

## 2016-11-23 HISTORY — PX: COLOSTOMY: SHX63

## 2016-11-23 LAB — CBC
HEMATOCRIT: 41.2 % (ref 35.0–47.0)
HEMOGLOBIN: 13.8 g/dL (ref 12.0–16.0)
MCH: 30.4 pg (ref 26.0–34.0)
MCHC: 33.4 g/dL (ref 32.0–36.0)
MCV: 91.2 fL (ref 80.0–100.0)
Platelets: 237 10*3/uL (ref 150–440)
RBC: 4.52 MIL/uL (ref 3.80–5.20)
RDW: 17.2 % — ABNORMAL HIGH (ref 11.5–14.5)
WBC: 10.9 10*3/uL (ref 3.6–11.0)

## 2016-11-23 LAB — CREATININE, SERUM
CREATININE: 0.72 mg/dL (ref 0.44–1.00)
GFR calc Af Amer: >60 mL/min (ref >60)
GFR calc non Af Amer: >60 mL/min (ref >60)

## 2016-11-23 SURGERY — LAPAROSCOPY, DIAGNOSTIC
Anesthesia: General | Wound class: Clean

## 2016-11-23 MED ORDER — ACETAMINOPHEN 325 MG PO TABS: 650.0000 mg | ORAL_TABLET | Freq: Four times a day (QID) | ORAL | Status: DC | PRN

## 2016-11-23 MED ORDER — ACETAMINOPHEN 10 MG/ML IV SOLN
INTRAVENOUS | Status: DC | PRN
  Administered 2016-11-23: 1000 mg via INTRAVENOUS

## 2016-11-23 MED ORDER — CHLORHEXIDINE GLUCONATE CLOTH 2 % EX PADS
6.0000 | MEDICATED_PAD | Freq: Once | CUTANEOUS | Status: DC
  Administered 2016-11-23: 6 via TOPICAL

## 2016-11-23 MED ORDER — NEOSTIGMINE METHYLSULFATE 10 MG/10ML IV SOLN
INTRAVENOUS | Status: DC | PRN
  Administered 2016-11-23: 3 mg via INTRAVENOUS

## 2016-11-23 MED ORDER — ISOSORBIDE MONONITRATE ER 30 MG PO TB24
30.0000 mg | ORAL_TABLET | Freq: Every day | ORAL | Status: DC
  Administered 2016-11-23 – 2016-11-25 (×3): 30 mg via ORAL
  Filled 2016-11-23 (×3): qty 1

## 2016-11-23 MED ORDER — LABETALOL HCL 5 MG/ML IV SOLN
INTRAVENOUS | Status: DC | PRN
  Administered 2016-11-23 (×2): 5 mg via INTRAVENOUS

## 2016-11-23 MED ORDER — ALVIMOPAN 12 MG PO CAPS
12.0000 mg | ORAL_CAPSULE | Freq: Once | ORAL | Status: AC
  Administered 2016-11-23: 12 mg via ORAL

## 2016-11-23 MED ORDER — ALVIMOPAN 12 MG PO CAPS
ORAL_CAPSULE | ORAL | Status: AC
  Administered 2016-11-23: 12 mg via ORAL
  Filled 2016-11-23: qty 1

## 2016-11-23 MED ORDER — FENTANYL 12 MCG/HR TD PT72
25.0000 ug | MEDICATED_PATCH | TRANSDERMAL | Status: DC
  Administered 2016-11-23: 25 ug via TRANSDERMAL
  Filled 2016-11-23: qty 2

## 2016-11-23 MED ORDER — NITROGLYCERIN 0.4 MG SL SUBL: 0.4000 mg | SUBLINGUAL_TABLET | SUBLINGUAL | Status: DC | PRN

## 2016-11-23 MED ORDER — FAMOTIDINE 20 MG PO TABS
ORAL_TABLET | ORAL | Status: AC
  Administered 2016-11-23: 20 mg via ORAL
  Filled 2016-11-23: qty 1

## 2016-11-23 MED ORDER — SODIUM CHLORIDE 0.9 % IV SOLN
1.0000 g | INTRAVENOUS | Status: AC
  Administered 2016-11-23: 1 g via INTRAVENOUS
  Filled 2016-11-23: qty 1

## 2016-11-23 MED ORDER — PROPOFOL 10 MG/ML IV BOLUS
INTRAVENOUS | Status: DC | PRN
  Administered 2016-11-23: 120 mg via INTRAVENOUS

## 2016-11-23 MED ORDER — LISINOPRIL 10 MG PO TABS
10.0000 mg | ORAL_TABLET | Freq: Every day | ORAL | Status: DC
  Administered 2016-11-23 – 2016-11-25 (×3): 10 mg via ORAL
  Filled 2016-11-23 (×3): qty 1

## 2016-11-23 MED ORDER — PANTOPRAZOLE SODIUM 40 MG IV SOLR
40.0000 mg | Freq: Every day | INTRAVENOUS | Status: DC
  Administered 2016-11-23 – 2016-11-25 (×3): 40 mg via INTRAVENOUS
  Filled 2016-11-23 (×3): qty 40

## 2016-11-23 MED ORDER — SUCCINYLCHOLINE CHLORIDE 20 MG/ML IJ SOLN
INTRAMUSCULAR | Status: DC | PRN
  Administered 2016-11-23: 70 mg via INTRAVENOUS

## 2016-11-23 MED ORDER — OXYCODONE HCL 5 MG PO TABS: 5.0000 mg | ORAL_TABLET | Freq: Once | ORAL | Status: DC | PRN

## 2016-11-23 MED ORDER — GLYCOPYRROLATE 0.2 MG/ML IJ SOLN
INTRAMUSCULAR | Status: DC | PRN
  Administered 2016-11-23: 0.6 mg via INTRAVENOUS

## 2016-11-23 MED ORDER — ROCURONIUM BROMIDE 100 MG/10ML IV SOLN
INTRAVENOUS | Status: DC | PRN
  Administered 2016-11-23 (×3): 5 mg via INTRAVENOUS
  Administered 2016-11-23: 40 mg via INTRAVENOUS

## 2016-11-23 MED ORDER — ONDANSETRON HCL 4 MG/2ML IJ SOLN
INTRAMUSCULAR | Status: DC | PRN
  Administered 2016-11-23: 4 mg via INTRAVENOUS

## 2016-11-23 MED ORDER — LACTATED RINGERS IV SOLN
INTRAVENOUS | Status: DC
  Administered 2016-11-23 (×2): via INTRAVENOUS

## 2016-11-23 MED ORDER — PROMETHAZINE HCL 25 MG/ML IJ SOLN
INTRAMUSCULAR | Status: AC
  Administered 2016-11-23: 10:00:00
  Filled 2016-11-23: qty 1

## 2016-11-23 MED ORDER — OXYCODONE HCL 5 MG/5ML PO SOLN: 5.0000 mg | Freq: Once | ORAL | Status: DC | PRN

## 2016-11-23 MED ORDER — MEPERIDINE HCL 25 MG/ML IJ SOLN: 6.2500 mg | INTRAMUSCULAR | Status: DC | PRN

## 2016-11-23 MED ORDER — ACETAMINOPHEN 10 MG/ML IV SOLN
INTRAVENOUS | Status: AC
  Filled 2016-11-23: qty 100

## 2016-11-23 MED ORDER — ENOXAPARIN SODIUM 40 MG/0.4ML ~~LOC~~ SOLN
40.0000 mg | SUBCUTANEOUS | Status: DC
  Administered 2016-11-24 – 2016-11-25 (×2): 40 mg via SUBCUTANEOUS
  Filled 2016-11-23 (×2): qty 0.4

## 2016-11-23 MED ORDER — FENTANYL CITRATE (PF) 100 MCG/2ML IJ SOLN
INTRAMUSCULAR | Status: DC | PRN
  Administered 2016-11-23: 100 ug via INTRAVENOUS
  Administered 2016-11-23: 50 ug via INTRAVENOUS
  Administered 2016-11-23: 25 ug via INTRAVENOUS
  Administered 2016-11-23: 50 ug via INTRAVENOUS
  Administered 2016-11-23: 25 ug via INTRAVENOUS
  Administered 2016-11-23: 50 ug via INTRAVENOUS

## 2016-11-23 MED ORDER — DEXTROSE-NACL 5-0.45 % IV SOLN
INTRAVENOUS | Status: DC
  Administered 2016-11-23 – 2016-11-25 (×4): via INTRAVENOUS

## 2016-11-23 MED ORDER — ONDANSETRON 4 MG PO TBDP: 4.0000 mg | ORAL_TABLET | Freq: Four times a day (QID) | ORAL | Status: DC | PRN

## 2016-11-23 MED ORDER — ACETAMINOPHEN 650 MG RE SUPP: 650.0000 mg | Freq: Four times a day (QID) | RECTAL | Status: DC | PRN

## 2016-11-23 MED ORDER — ONDANSETRON HCL 4 MG/2ML IJ SOLN
4.0000 mg | Freq: Four times a day (QID) | INTRAMUSCULAR | Status: DC | PRN
  Administered 2016-11-24: 4 mg via INTRAVENOUS
  Filled 2016-11-23: qty 2

## 2016-11-23 MED ORDER — LIDOCAINE HCL (CARDIAC) 20 MG/ML IV SOLN
INTRAVENOUS | Status: DC | PRN
  Administered 2016-11-23: 60 mg via INTRAVENOUS

## 2016-11-23 MED ORDER — OXYCODONE HCL 5 MG PO TABS
5.0000 mg | ORAL_TABLET | ORAL | Status: DC | PRN
  Administered 2016-11-23: 5 mg via ORAL
  Administered 2016-11-23 – 2016-11-25 (×9): 10 mg via ORAL
  Filled 2016-11-23 (×9): qty 2
  Filled 2016-11-23: qty 1

## 2016-11-23 MED ORDER — FENTANYL CITRATE (PF) 100 MCG/2ML IJ SOLN
INTRAMUSCULAR | Status: AC
  Administered 2016-11-23: 10:00:00
  Filled 2016-11-23: qty 2

## 2016-11-23 MED ORDER — FAMOTIDINE 20 MG PO TABS
20.0000 mg | ORAL_TABLET | Freq: Once | ORAL | Status: AC
  Administered 2016-11-23: 20 mg via ORAL

## 2016-11-23 MED ORDER — FENTANYL CITRATE (PF) 100 MCG/2ML IJ SOLN
25.0000 ug | INTRAMUSCULAR | Status: DC | PRN
  Administered 2016-11-23 (×2): 50 ug via INTRAVENOUS

## 2016-11-23 MED ORDER — PROMETHAZINE HCL 25 MG/ML IJ SOLN
6.2500 mg | INTRAMUSCULAR | Status: DC | PRN
  Administered 2016-11-23: 6.25 mg via INTRAVENOUS

## 2016-11-23 MED ORDER — MIDAZOLAM HCL 2 MG/2ML IJ SOLN
INTRAMUSCULAR | Status: DC | PRN
  Administered 2016-11-23: 2 mg via INTRAVENOUS

## 2016-11-23 MED ORDER — PHENYLEPHRINE HCL 10 MG/ML IJ SOLN
INTRAMUSCULAR | Status: DC | PRN
  Administered 2016-11-23: 50 ug via INTRAVENOUS

## 2016-11-23 MED ORDER — SODIUM CHLORIDE 0.9 % IJ SOLN
INTRAMUSCULAR | Status: AC
  Administered 2016-11-23: 10:00:00
  Filled 2016-11-23: qty 10

## 2016-11-23 MED ORDER — AMLODIPINE BESYLATE 10 MG PO TABS
10.0000 mg | ORAL_TABLET | Freq: Every evening | ORAL | Status: DC
  Administered 2016-11-23 – 2016-11-24 (×2): 10 mg via ORAL
  Filled 2016-11-23 (×2): qty 1

## 2016-11-23 SURGICAL SUPPLY — 66 items
ADH SKN CLS APL DERMABOND .7 (GAUZE/BANDAGES/DRESSINGS) ×1
BLADE SURG 10 STRL SS SAFETY (BLADE) IMPLANT
BLADE SURG 11 STRL SS SAFETY (MISCELLANEOUS) ×2 IMPLANT
BLADE SURG 15 STRL SS SAFETY (BLADE) ×2 IMPLANT
CANISTER SUCT 1200ML W/VALVE (MISCELLANEOUS) ×2 IMPLANT
CANNULA DILATOR 10 W/SLV (CANNULA) ×2 IMPLANT
CATH TRAY 16F METER LATEX (MISCELLANEOUS) ×2 IMPLANT
CHLORAPREP W/TINT 26ML (MISCELLANEOUS) ×2 IMPLANT
CLEANER CAUTERY TIP 5X5 PAD (MISCELLANEOUS) IMPLANT
DEFOGGER SCOPE WARMER CLEARIFY (MISCELLANEOUS) ×2 IMPLANT
DERMABOND ADVANCED (GAUZE/BANDAGES/DRESSINGS) ×1
DERMABOND ADVANCED .7 DNX12 (GAUZE/BANDAGES/DRESSINGS) ×1 IMPLANT
DRAPE INCISE IOBAN 66X45 STRL (DRAPES) ×2 IMPLANT
DRAPE LAPAROTOMY 100X77 ABD (DRAPES) ×2 IMPLANT
DRSG TELFA 3X8 NADH (GAUZE/BANDAGES/DRESSINGS) ×2 IMPLANT
ELECT BLADE 6 FLAT ULTRCLN (ELECTRODE) ×1 IMPLANT
ELECT REM PT RETURN 9FT ADLT (ELECTROSURGICAL) ×2
ELECTRODE REM PT RTRN 9FT ADLT (ELECTROSURGICAL) ×1 IMPLANT
GLOVE BIO SURGEON STRL SZ7 (GLOVE) ×4 IMPLANT
GOWN STRL REUS W/ TWL LRG LVL3 (GOWN DISPOSABLE) ×4 IMPLANT
GOWN STRL REUS W/TWL LRG LVL3 (GOWN DISPOSABLE) ×6
GRASPER SUT TROCAR 14GX15 (MISCELLANEOUS) ×2 IMPLANT
HANDLE YANKAUER SUCT BULB TIP (MISCELLANEOUS) IMPLANT
HOLDER FOLEY CATH W/STRAP (MISCELLANEOUS) ×2 IMPLANT
IRRIGATION STRYKERFLOW (MISCELLANEOUS) ×1 IMPLANT
IRRIGATOR STRYKERFLOW (MISCELLANEOUS)
IV LACTATED RINGERS 1000ML (IV SOLUTION) ×3 IMPLANT
KIT RM TURNOVER STRD PROC AR (KITS) ×2 IMPLANT
LABEL OR SOLS (LABEL) ×2 IMPLANT
MESH BIO-A  8X8 SYN MATR (Mesh General) ×1 IMPLANT
MESH BIO-A 8X8 SYN MATR (Mesh General) IMPLANT
NDL INSUFF ACCESS 14 VERSASTEP (NEEDLE) ×2 IMPLANT
NS IRRIG 500ML POUR BTL (IV SOLUTION) ×2 IMPLANT
PACK BASIN MAJOR ARMC (MISCELLANEOUS) ×1 IMPLANT
PACK BASIN MINOR ARMC (MISCELLANEOUS) ×1 IMPLANT
PACK LAP CHOLECYSTECTOMY (MISCELLANEOUS) ×2 IMPLANT
PAD CLEANER CAUTERY TIP 5X5 (MISCELLANEOUS)
PAD DRESSING TELFA 3X8 NADH (GAUZE/BANDAGES/DRESSINGS) ×1 IMPLANT
PENCIL ELECTRO HAND CTR (MISCELLANEOUS) ×1 IMPLANT
POUCH ENDO CATCH 10MM SPEC (MISCELLANEOUS) IMPLANT
RELOAD STAPLE 60 3.6 BLU REG (STAPLE) IMPLANT
RELOAD STAPLER BLUE 60MM (STAPLE) ×1 IMPLANT
SCISSORS METZENBAUM CVD 33 (INSTRUMENTS) ×1 IMPLANT
SET SUCTION IRRIG HYDROSURG (IRRIGATION / IRRIGATOR) ×1 IMPLANT
SHEARS HARMONIC ACE PLUS 36CM (ENDOMECHANICALS) ×1 IMPLANT
SLEEVE ENDOPATH XCEL 5M (ENDOMECHANICALS) ×2 IMPLANT
SPONGE LAP 18X18 5 PK (GAUZE/BANDAGES/DRESSINGS) ×2 IMPLANT
STAPLE ECHEON FLEX 60 POW ENDO (STAPLE) ×1 IMPLANT
STAPLER RELOAD BLUE 60MM (STAPLE) ×2
STAPLER SKIN PROX 35W (STAPLE) ×1 IMPLANT
SUT PROLENE 0 CT 1 30 (SUTURE) ×8 IMPLANT
SUT SILK 3-0 (SUTURE) ×3 IMPLANT
SUT SILK 3-0 SH-1 18XCR BRD (SUTURE) ×1
SUT VIC AB 0 SH 27 (SUTURE) ×1 IMPLANT
SUT VIC AB 3-0 54X BRD REEL (SUTURE) ×1 IMPLANT
SUT VIC AB 3-0 BRD 54 (SUTURE)
SUT VIC AB 3-0 SH 27 (SUTURE) ×6
SUT VIC AB 3-0 SH 27X BRD (SUTURE) ×1 IMPLANT
SUT VIC AB 4-0 FS2 27 (SUTURE) ×1 IMPLANT
SUT VIC AB 4-0 PS2 18 (SUTURE) ×1 IMPLANT
SUTURE SILK 3-0 SH-1 18XCR BRD (SUTURE) IMPLANT
SYR BULB IRRIG 60ML STRL (SYRINGE) ×1 IMPLANT
TROCAR XCEL 12X100 BLDLESS (ENDOMECHANICALS) ×1 IMPLANT
TROCAR XCEL NON-BLD 11X100MML (ENDOMECHANICALS) ×1 IMPLANT
TROCAR XCEL NON-BLD 5MMX100MML (ENDOMECHANICALS) ×2 IMPLANT
TUBING INSUFFLATOR HI FLOW (MISCELLANEOUS) ×2 IMPLANT

## 2016-11-23 NOTE — Transfer of Care (Signed)
Immediate Anesthesia Transfer of Care Note  Patient: Rachel [name]  Procedure(s) Performed: Procedure(s): LAPAROSCOPY DIAGNOSTIC (N/A) SIGMOID COLOSTOMY (N/A)  Patient Location: PACU  Anesthesia Type:General  Level of Consciousness: sedated  Airway & Oxygen Therapy: Patient Spontanous Breathing and Patient connected to face mask oxygen  Post-op Assessment: Report given to RN and Post -op Vital signs reviewed and stable  Post vital signs: Reviewed and stable  Last Vitals:  Vitals:   11/23/16 0638 11/23/16 0943  BP: 114/84 124/70  Pulse: 84 81  Resp: 18 16  Temp: (!) 35.4 C     Last Pain:  Vitals:   11/23/16 0638  TempSrc: Tympanic  PainSc: 7          Complications: No apparent anesthesia complications

## 2016-11-23 NOTE — Progress Notes (Signed)
Pt's daughter will inform nursing about Flu vaccine tomorrow.

## 2016-11-23 NOTE — Anesthesia Procedure Notes (Signed)
Procedure Name: Intubation Performed by: Neli Fofana Pre-anesthesia Checklist: Patient identified, Patient being monitored, Timeout performed, Emergency Drugs available and Suction available Patient Re-evaluated:Patient Re-evaluated prior to inductionOxygen Delivery Method: Circle system utilized Preoxygenation: Pre-oxygenation with 100% oxygen Intubation Type: IV induction Ventilation: Mask ventilation without difficulty Laryngoscope Size: Mac and 3 Grade View: Grade I Tube type: Oral Tube size: 7.0 mm Number of attempts: 1 Airway Equipment and Method: Stylet Placement Confirmation: ETT inserted through vocal cords under direct vision,  positive ETCO2 and breath sounds checked- equal and bilateral Secured at: 21 cm Tube secured with: Tape Dental Injury: Teeth and Oropharynx as per pre-operative assessment        

## 2016-11-23 NOTE — Interval H&P Note (Signed)
History and Physical Interval Note:  11/23/2016 7:05 AM  Rachel [name]  has presented today for surgery, with the diagnosis of cancer rectum  The various methods of treatment have been discussed with the patient and family. After consideration of risks, benefits and other options for treatment, the patient has consented to  Procedure(s): LAPAROSCOPY DIAGNOSTIC (N/A) SIGMOID COLOSTOMY (N/A) as a surgical intervention .  The patient's history has been reviewed, patient examined, no change in status, stable for surgery.  I have reviewed the patient's chart and labs.  Questions were answered to the patient's satisfaction.     Page Pucciarelli G

## 2016-11-23 NOTE — Telephone Encounter (Signed)
She needs letter for fmla faxed to [place]

## 2016-11-23 NOTE — Anesthesia Postprocedure Evaluation (Signed)
Anesthesia Post Note  Patient: STACEE GILDNER  Procedure(s) Performed: Procedure(s) (LRB): LAPAROSCOPY DIAGNOSTIC (N/A) SIGMOID COLOSTOMY (N/A)  Patient location during evaluation: PACU Anesthesia Type: General Level of consciousness: awake and alert and oriented Pain management: pain level controlled Vital Signs Assessment: post-procedure vital signs reviewed and stable Respiratory status: spontaneous breathing, nonlabored ventilation and respiratory function stable Cardiovascular status: blood pressure returned to baseline and stable Postop Assessment: no signs of nausea or vomiting Anesthetic complications: no    Last Vitals:  Vitals:   11/23/16 0958 11/23/16 1013  BP: 133/78 124/84  Pulse: 76 77  Resp: 17 19  Temp:      Last Pain:  Vitals:   11/23/16 1013  TempSrc:   PainSc: 10-Worst pain ever                 Laderrick Wilk

## 2016-11-23 NOTE — Progress Notes (Signed)
Pt came to floor at 1107. VSS. Pt was oriented to room and safety plan. SCD's hooked up. IS at bedside. Bed in low position.

## 2016-11-23 NOTE — Op Note (Signed)
Preop diagnosis: Carcinoma rectum  Post op diagnosis: Same  Operation: Laparoscopy in sigmoid colostomy  Surgeon: Mckinley Jewel   Assistant: Job Founds  Anesthesia: Gen.  Complications: None  EBL: 25 mL  Drains: None  Description: Patient was put to sleep in the supine position the operating table. Foley catheter was inserted this was removed at the end of the procedure. Abdomen was prepped and draped as sterile field. Timeout was performed. Initial port entry was made in the epigastric region with a small 5 mm incision. The Veress needle was positioned the peritoneal cavity verified of the hanging drop method. After pneumoperitoneum was obtained a 5 mm port was placed followed by insertion of the scope and there was good visualization. 2 lateral 11 mm ports were placed on the right mid and lower abdomen. The sigmoid colon was noted be freely mobile and in the just past the midpoint of the sigmoid that was selected for transection the bowel and bringing the proximal end as a colostomy. Scanning the rest of the abdomen showed that there was some adhesions in the left upper quadrant area of the omentum this did not affect the area of the colostomy. Liver appeared to be free of any metastatic disease in the areas visualized. The sigmoid colon was adequately lifted up and spread out to expose the mesentery. Harmonic scalpel was then used to open the mesentery to allow for subsequent the echelon stapling device across the colon. The instrument was fired to complete the transection of the bowel. The mesentery was then scored and taken down even further to allow for the proximal sigmoid colon to be brought out through the planned colostomy site. After this was adequately accomplished the area was irrigated and some bleeding points were sealed with the use of the harmonic device. The circular incision was made at the previously marked spot in the left lower abdomen. Incision was deepened through to the  fascia and the skin and fascia subcutaneous fat removed. Cruciate incision was made in the anterior sheath of the rectus and the edges were lifted up to free up the rectus muscle all around. An 8 x 8 cm bio a mesh was brought up to the field it was then cut to approximate size of the space underneath the anterior sheath of the rectus. Central opening was made for the colostomy. The mesh was placed in this space. The proximal end of the sigmoid colon was brought up through the opening in the peritoneum and through to the skin level and allowing about the 2-2-1/2 cm of colon above the skin level. Following this the fascia was approximated to the bowel in 4 quadrants with 3-0 Vicryl stitches. Initially the colostomy was twisted and this had be untwisted after visualization with the laparoscope. And this was verified after the colon was properly positioned at the colostomy site. The beginning the fascial edges were approximated the bowel. Pneumoperitoneum was released the remaining ports are removed. The colostomy was then matured with the everting stitches of 3-0 Vicryl to the skin edge. The port site incisions were closed with subcuticular 4-0 Vicryl covered with Dermabond. Colostomy appliance was then placed. Patient subsequently extubated and returned recovery room stable condition.

## 2016-11-23 NOTE — H&P (View-Only) (Signed)
Patient ID: Rachel [name], female   DOB: 12-14-1955, 60 y.o.   MRN: [id number]  Chief Complaint  Patient presents with  . Follow-up    thyroid    HPI Rachel [name] is a 60 y.o. female.  Here today for follow up thyroid nodule and possible needle aspiration. She is also following up from recent CT scan and colonoscopy. She is having a lot of pain in rectum especially with bowel movement Currently on Fentanyl patch with Tramadol prn. I have reviewed the history of present illness with the patient. HPI  Past Medical History:  Diagnosis Date  . Body mass index (BMI) of 24.0-24.9 in adult   . Cardiovascular disease   . Current every day smoker    a. ~35 years - > has cut down to < 3/4 ppd  . Headache   . History of UTI   . Hypertension   . Midsternal chest pain    a. 05/2014 Stress Echo: Ex time: 8:09, ECG w/o acute changes, EF nl with possible mid and apical anterior HK-->cath recommended.  . Rectal cancer ([place]) 2017  . Stroke ([place])    07/2016  . Syncope and collapse     Past Surgical History:  Procedure Laterality Date  . ABDOMINAL HYSTERECTOMY    . BACK SURGERY    . CHOLECYSTECTOMY    . COLONOSCOPY WITH PROPOFOL N/A 08/15/2016   Procedure: COLONOSCOPY WITH PROPOFOL;  Surgeon: Lollie Sails, MD;  Location: [facility] ENDOSCOPY;  Service: Endoscopy;  Laterality: N/A;  . COLONOSCOPY WITH PROPOFOL N/A 11/08/2016   Procedure: COLONOSCOPY WITH PROPOFOL;  Surgeon: Christene Lye, MD;  Location: [facility] ENDOSCOPY;  Service: Endoscopy;  Laterality: N/A;  . ERCP    . PARTIAL HYSTERECTOMY    . PORTACATH PLACEMENT N/A 08/18/2016   Procedure: INSERTION PORT-A-CATH;  Surgeon: Christene Lye, MD;  Location: [facility] ORS;  Service: General;  Laterality: N/A;    Family History  Problem Relation Age of Onset  . Breast cancer Mother   . Hyperlipidemia Father   . Heart disease Paternal Grandmother   . Stroke Maternal Grandfather     Social History Social History  Substance Use  Topics  . Smoking status: Former Smoker    Packs/day: 0.25    Years: 35.00    Types: Cigarettes    Quit date: 08/11/2016  . Smokeless tobacco: Never Used  . Alcohol use No     Comment: occasional    Allergies  Allergen Reactions  . Prednisone Other (See Comments)    BLISTERS IN MOUTH AND ON TONGUE    Current Outpatient Prescriptions  Medication Sig Dispense Refill  . amLODipine (NORVASC) 10 MG tablet Take 1 tablet (10 mg total) by mouth every evening. 30 tablet 0  . clopidogrel (PLAVIX) 75 MG tablet Take 75 mg by mouth daily.    . diphenoxylate-atropine (LOMOTIL) 2.5-0.025 MG tablet Take 1 tablet by mouth 4 (four) times daily as needed for diarrhea or loose stools. Take it along with immodium 30 tablet 0  . fentaNYL (DURAGESIC - DOSED MCG/HR) 25 MCG/HR patch Place 1 patch (25 mcg total) onto the skin every 3 (three) days. 5 patch 0  . isosorbide mononitrate (IMDUR) 30 MG 24 hr tablet Take 1 tablet (30 mg total) by mouth daily. 90 tablet 3  . lidocaine-prilocaine (EMLA) cream Apply 1 application topically as needed. 30 g 4  . lisinopril (PRINIVIL,ZESTRIL) 10 MG tablet Take 10 mg by mouth daily.    . nitroGLYCERIN (NITROSTAT) 0.4 MG  SL tablet Place 1 tablet (0.4 mg total) under the tongue every 5 (five) minutes as needed for chest pain. 25 tablet 6  . ondansetron (ZOFRAN) 8 MG tablet Take 1 tablet (8 mg total) by mouth 2 (two) times daily as needed (Nausea or vomiting). 30 tablet 1  . potassium chloride SA (K-DUR,KLOR-CON) 20 MEQ tablet 1 pill twice a day 30 tablet 3  . prochlorperazine (COMPAZINE) 10 MG tablet Take 1 tablet (10 mg total) by mouth every 6 (six) hours as needed (Nausea or vomiting). 30 tablet 1  . traMADol (ULTRAM) 50 MG tablet Take 50 mg by mouth every 6 (six) hours as needed for moderate pain.    Derrill Memo ON 11/22/2016] metroNIDAZOLE (FLAGYL) 500 MG tablet Take one (1) tablet at 6 pm and one (1) tablet at 11 pm the evening prior to surgery. 2 tablet 0  . [START ON  11/22/2016] neomycin (MYCIFRADIN) 500 MG tablet Take two (2) tablets at 6 pm and two (2) tablets at 11 pm the evening prior to surgery. 4 tablet 0  . polyethylene glycol powder (GLYCOLAX/MIRALAX) powder 255 grams one bottle for bowel prep 255 g 0   No current facility-administered medications for this visit.     Review of Systems Review of Systems  Constitutional: Negative.   Respiratory: Negative.   Cardiovascular: Negative.     Blood pressure 118/68, pulse 66, resp. rate 12, height 5\' 6"  (1.676 m), weight 139 lb (63 kg).  Physical Exam Physical Exam  Constitutional: She is oriented to person, place, and time. She appears well-developed and well-nourished.  Eyes: Conjunctivae are normal. No scleral icterus.  Neck: Neck supple. Thyroid mass present.    Cardiovascular: Normal rate, regular rhythm and normal heart sounds.   Pulmonary/Chest: Effort normal and breath sounds normal.  Abdominal: Soft. Bowel sounds are normal. She exhibits no distension and no mass. There is no tenderness.  Lymphadenopathy:    She has no cervical adenopathy.  Neurological: She is alert and oriented to person, place, and time.  Skin: Skin is warm and dry.  Psychiatric: Her behavior is normal.    Data Reviewed  progress notes, colonoscopy findings.CT abd/pelvis US of thyroid shows > 3cm heterogeneous mass in right thyroid lobe. Small cyst in isthmus on right and one in left lobe. 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 The rectal mass appears very hard and fixed. Very tender. It only allows one fingerbreadth opening. CT scan reveals moderate edema in perirectal area. No fixity to sacrum noted.  Discussed fully with pt. She more than likely will need APR once the edema clears. In the interim she will benefit with colostomy - this will lessen pain.   Plan    Above discussed with pt and she is agreeable to colostomy. Aware that at a lter time resection of rectum will be done Patient's surgery has been scheduled for 11-23-16 at [facility]. This patient has been asked to discontinue Plavix 5 days prior to procedure and begin an 81 mg aspirin once Plavix is stopped. Patient has also been asked to complete a bowel prep with antibiotics.      This information has been scribed by Karie Fetch RN, BSN,BC.  [name],[name] 11/16/2016, 10:42 AM

## 2016-11-23 NOTE — Care Management (Signed)
Notified by department director that patient had concerns of completing "disability forms".  Attempted to meet to patient to follow up. Patient asleep at this time

## 2016-11-23 NOTE — Anesthesia Preprocedure Evaluation (Signed)
Anesthesia Evaluation  Patient identified by MRN, date of birth, ID band Patient awake    Reviewed: Allergy & Precautions, NPO status , Patient's Chart, lab work & pertinent test results, reviewed documented beta blocker date and time   History of Anesthesia Complications Negative for: history of anesthetic complications  Airway Mallampati: II  TM Distance: >3 FB Neck ROM: Full    Dental  (+) Upper Dentures, Lower Dentures   Pulmonary neg sleep apnea, neg COPD, Current Smoker, former smoker,    breath sounds clear to auscultation- rhonchi (-) wheezing      Cardiovascular hypertension, Pt. on medications and Pt. on home beta blockers (-) CAD and (-) Past MI  Rhythm:Regular Rate:Normal - Systolic murmurs and - Diastolic murmurs    Neuro/Psych  Headaches, CVA, No Residual Symptoms negative psych ROS   GI/Hepatic negative GI ROS, Neg liver ROS,   Endo/Other  negative endocrine ROSneg diabetes  Renal/GU negative Renal ROS     Musculoskeletal  (+) Arthritis ,   Abdominal (+) - obese,   Peds  Hematology negative hematology ROS (+)   Anesthesia Other Findings Past Medical History: No date: Body mass index (BMI) of 24.0-24.9 in adult No date: Cardiovascular disease No date: Current every day smoker     Comment: a. ~35 years - > has cut down to < 3/4 ppd No date: Headache No date: History of UTI No date: Hypertension No date: Midsternal chest pain     Comment: a. 05/2014 Stress Echo: Ex time: 8:09, ECG w/o               acute changes, EF nl with possible mid and               apical anterior HK-->cath recommended. No date: Stroke [facility]) No date: Syncope and collapse   Reproductive/Obstetrics                             Anesthesia Physical  Anesthesia Plan  ASA: III  Anesthesia Plan: General   Post-op Pain Management:    Induction: Intravenous  Airway Management Planned: Natural  Airway  Additional Equipment:   Intra-op Plan:   Post-operative Plan:   Informed Consent: I have reviewed the patients History and Physical, chart, labs and discussed the procedure including the risks, benefits and alternatives for the proposed anesthesia with the patient or authorized representative who has indicated his/her understanding and acceptance.   Dental advisory given  Plan Discussed with: CRNA and Anesthesiologist  Anesthesia Plan Comments:         Anesthesia Quick Evaluation

## 2016-11-24 LAB — CBC
HEMATOCRIT: 35.8 % (ref 35.0–47.0)
Hemoglobin: 12.2 g/dL (ref 12.0–16.0)
MCH: 30.8 pg (ref 26.0–34.0)
MCHC: 34 g/dL (ref 32.0–36.0)
MCV: 90.6 fL (ref 80.0–100.0)
Platelets: 220 10*3/uL (ref 150–440)
RBC: 3.95 MIL/uL (ref 3.80–5.20)
RDW: 17 % — AB (ref 11.5–14.5)
WBC: 11.7 10*3/uL — AB (ref 3.6–11.0)

## 2016-11-24 LAB — BASIC METABOLIC PANEL
ANION GAP: 7 (ref 5–15)
BUN: 7 mg/dL (ref 6–20)
CO2: 28 mmol/L (ref 22–32)
Calcium: 8.5 mg/dL — ABNORMAL LOW (ref 8.9–10.3)
Chloride: 101 mmol/L (ref 101–111)
Creatinine, Ser: 0.6 mg/dL (ref 0.44–1.00)
Glucose, Bld: 150 mg/dL — ABNORMAL HIGH (ref 65–99)
POTASSIUM: 3.3 mmol/L — AB (ref 3.5–5.1)
SODIUM: 136 mmol/L (ref 135–145)

## 2016-11-24 MED ORDER — POTASSIUM CHLORIDE CRYS ER 20 MEQ PO TBCR
20.0000 meq | EXTENDED_RELEASE_TABLET | Freq: Two times a day (BID) | ORAL | Status: DC
  Administered 2016-11-24 – 2016-11-26 (×5): 20 meq via ORAL
  Filled 2016-11-24 (×5): qty 1

## 2016-11-24 NOTE — Progress Notes (Signed)
Initial Nutrition Assessment  DOCUMENTATION CODES:   Not applicable  INTERVENTION:  -Pt declined nutritional supplements at this time -Recommend smaller, more frequent meals to optimize nutritional intake, pt educated re: this. Pt agreeable to snacks such as Peanut Butter and Crackers once diet advanced -Provided "Colostmy Nutrition Therapy" handout to pt and discussed nutritional recommendations with pt and her daughter -Diet advancement as tolerated  NUTRITION DIAGNOSIS:   Inadequate oral intake related to cancer and cancer related treatments as evidenced by per patient/family report.  GOAL:   Patient will meet greater than or equal to 90% of their needs  MONITOR:   PO intake, Labs, Weight trends  REASON FOR ASSESSMENT:   Malnutrition Screening Tool    ASSESSMENT:   60 yo female admitted with rectal cancer s/p sigmoid colostomy on 12/14. Pt completed chemo and radiation treatment a 2 weeks  Pt with inability to eat much recently due to severe pain from rectal cancer. Diet advanced to [facility] today; pt tolerating liquid meals with improvement in rectal pain post surgery Pt reports she does not like Ensure or any other milky supplements. Offered juice-like supplement such as [employer], pt declined stating juice gives her reflux. Pt weighed 150 pounds in August, current wt at home 133 pounds. 11.3% wt loss. Per weight encounters, weight relatively stable since September  Pt has been ambulating around the unit multiple times today, pt feels better, no N/V/abdominal pain  Unable to complete Nutrition-Focused physical exam at this time.   Labs: reviewed Meds: reviewed  Diet Order:  Diet full liquid Room service appropriate? Yes; Fluid consistency: Thin  Skin:  Reviewed, no issues  Last BM:  12/14; no stool via ostomy yet  Height:   Ht Readings from Last 1 Encounters:  11/23/16 5\' 6"  (1.676 m)    Weight:   Wt Readings from Last 1 Encounters:  11/23/16 138 lb (62.6  kg)    Wt Readings from Last 10 Encounters:  11/23/16 138 lb (62.6 kg)  11/22/16 138 lb 1.9 oz (62.6 kg)  11/16/16 139 lb (63 kg)  11/08/16 137 lb (62.1 kg)  10/26/16 137 lb 14.4 oz (62.6 kg)  10/24/16 138 lb (62.6 kg)  09/28/16 142 lb 9.6 oz (64.7 kg)  09/14/16 148 lb (67.1 kg)  09/04/16 148 lb (67.1 kg)  08/31/16 139 lb 9.6 oz (63.3 kg)    BMI:  Body mass index is 22.27 kg/m.  Estimated Nutritional Needs:   Kcal:  1900-2200 kcals  Protein:  76-95 g  Fluid:  >/= 2 L  EDUCATION NEEDS:   Education needs addressed  Kerman Passey MS, RD, LDN [phone] Pager  [phone] Weekend/On-Call Pager

## 2016-11-24 NOTE — Consult Note (Signed)
[place] Nurse ostomy consult note Stoma type/location: LLQ Colostomy pouch change with daughter at bedside.  Stomal assessment/size: 1 1/2" pink and moist.  NO stool in pouch.  Stoma sweat only at this time. Abdomen is firm and tender.  Encouraged to ambulate to relieve gas and promote intestinal motility  Peristomal assessment: intact.  Abdomen bloated Treatment options for stomal/peristomal skin: Barrier ring to improve wear time.  Output Stoma sweat only Ostomy pouching: 2pc. 2 1/4" pouch with barrier ring  4 pouches given to daughter to take home  Education provided: Pouch change performed.  Patient cut barrier to fit.  Was able to roll closed.  Instructed to change twice weekly.  Discussed showering and activity level.  Will have [place] for ongoing teaching.  Enrolled patient in [place] program: Yes [place] team will follow.  Domenic Moras RN BSN [place] Pager [phone]

## 2016-11-24 NOTE — Progress Notes (Signed)
Patient ID: Rachel [name], female   DOB: Dec 05, 1956, 60 y.o.   MRN: [id number] C/o intermittent gas pains in different locations of abdomen. Only walked a couple of times yesterday- to BR. AVSS. Abdomen is soft, good bowel sounds.Port sites are clean. Colostomy pink, no output yet. Lungs clear CBC ok. Stable overall.  Advance diet to full liq.  Encouraged pt to ambulate. Home once colostomy is functioning.

## 2016-11-25 MED ORDER — BISACODYL 10 MG RE SUPP
10.0000 mg | Freq: Once | RECTAL | Status: AC
Start: 2016-11-25 — End: 2016-11-25
  Administered 2016-11-25: 10 mg via RECTAL
  Filled 2016-11-25: qty 1

## 2016-11-25 NOTE — Progress Notes (Signed)
Afebrile. BP running low, no tachycardia. Patient without symptoms. Ambulating well.  Pain free in perineum, prior severe pain with sitting completely resolved. Lungs: Clear. Inspirex at 1500. Cardio: RR. ABD: Mild distension, soft.  Stoma: Healthy, small amount blood. Calves: Soft. Labs of yesterday unremarkable.  IMP: Mild low bp may be secondary to loss of chronic pain she had been experiencing. On Fentanyl patch for hip pain.  Plan: Advance diet, IV to saline lock, shower, dulcolax per stoma.

## 2016-11-26 MED ORDER — FENTANYL 12 MCG/HR TD PT72
12.5000 ug | MEDICATED_PATCH | TRANSDERMAL | Status: DC
  Administered 2016-11-26: 12.5 ug via TRANSDERMAL
  Filled 2016-11-26: qty 1

## 2016-11-26 MED ORDER — OXYCODONE HCL 5 MG PO TABS: 5.0000 mg | ORAL_TABLET | ORAL | 0 refills | Status: DC | PRN

## 2016-11-26 NOTE — Progress Notes (Signed)
Pt d/c home; d/c instructions reviewed w/ pt; pt understanding was verbalized; IV removed, catheter in tact, gauze dressing applied; all pt questions answered; pt teaching using teach-back on ostomy care done; pt verbalized and demonstrated understanding; pt verbalized that all pt belongings were accounted for; pt left unit via wheelchair accompanied by staff

## 2016-11-26 NOTE — Final Progress Note (Signed)
AVSS. Tolerated diet advance. + stoma output after suppository. Lungs: Clear. ABD: Soft. Extrem: Soft. Rectal pain nearly completely resolved. Was using fentanyl patch for severe pelvic pain, will decrease dose with today's application to 12. 5 mg, then discontinue.

## 2016-11-26 NOTE — Care Management Note (Addendum)
Case Management Note  Patient Details  Name: Rachel [name] MRN: [id number] Date of Birth: 1956/07/08  Subjective/Objective:     Discussed discharge planning with [name]. Addressed her concerns. A referral for HH-RN for colostomy care was called to  [place] [facility] per patient choice. [facility] Ostomy nurse has already visited [name] and set-up home ostomy equipment.              Action/Plan:   Expected Discharge Date:  11/26/16               Expected Discharge Plan:     In-House Referral:     Discharge planning Services     Post Acute Care Choice:    Choice offered to:     DME Arranged:    DME Agency:     HH Arranged:    HH Agency:     Status of Service:     If discussed at [employer], dates discussed:    Additional Comments:  Ayiden Milliman A, RN 11/26/2016, 9:49 AM

## 2016-11-29 ENCOUNTER — Telehealth: Payer: Self-pay | Admitting: *Deleted

## 2016-11-29 NOTE — Telephone Encounter (Signed)
She called to let us know she was running low on her ostomy bags. She states the Home Health Nurse that had called her told her they didn't help with supplies. I called [place] Nurse and left a message to see what I needed to do to get pt supplies ordered.

## 2016-11-29 NOTE — Telephone Encounter (Signed)
I asked pt daughter to call [place] to see if her mother had been set up with [facility] for Home Delivery. Her daughter called back to let us know that she gave them the needed information and that they would be sending supplies once form was faxed from our office. She apprectiated our call. I told her I would follow up with the form.

## 2016-12-08 NOTE — Discharge Summary (Signed)
Physician Discharge Summary  Patient ID: Rachel [name] MRN: [id number] DOB/AGE: 1956-11-24 60 y.o.  Admit date: 11/23/2016 Discharge date: 12/08/2016  11/26/16 Admission Diagnoses: Carcinoma rectum  Discharge Diagnoses: Carcinoma rectum Active Problems:   CA rectum, adenoca [facility])   Discharged Condition: good  Hospital Course: This 60 year old female was found to have a large rectal cancer with retrograde obstruction at the time of initial diagnosis. She subsequently had a full evaluation and underwent chemoradiation. Since that time the patient has been having fairly severe pain in the sacral area which necessitated the use of affected the patch. Colonoscopy was completed does not outpatient and this time I was able to negotiate the scope past the lesion. Few adenomatous polyps were removed from the colon. Based on the side of this lesion it was felt not amenable to a low anterior resection. Also noted was the fact that the mass was somewhat fixed likely resulting from the radiation effect. Given her severe pain in the sacral area was decided to do a colostomy as a permanent the colostomy with plan for subsequent resection of the rectosigmoid. Accordingly the patient was brought in on 11/23/2016 and underwent laparoscopy in the sigmoid ostomy with closure of the distal segment. Procedure was uncomplicated. Postoperative course also was uncomplicated. At the time of discharge the colostomy had started to function and remarkably however rectal/sacral pain was almost completely resolved    Consults: None  Significant Diagnostic Studies: None  Treatments: surgery: Laparoscopy and sigmoid colon colostomy  Discharge Exam: Blood pressure 108/71, pulse 71, temperature 98.3 F (36.8 C), temperature source Oral, resp. rate 17, height 5\' 6"  (1.676 m), weight 138 lb (62.6 kg), SpO2 100 %. GI: Lungs: Clear. Abd soft  + stoma output after suppository.  Disposition: 01-Home or Self Care  Discharge  Instructions    Diet - low sodium heart healthy    Complete by:  As directed    Discharge instructions    Complete by:  As directed    Tylenol: If needed for mild pain. Tramadol for moderate pain. Oxycodone if needed for severe pain.  OK to shower.  No driving until pain free.  Remove the fentanyl patch placed today on Wednesday.  You should not need additional patches.  Diet as tolerated.  Change stoma bag as needed.  A small amount of stool may still come from the rectum.  This is not a concern.   Increase activity slowly    Complete by:  As directed      Allergies as of 11/26/2016      Reactions   Prednisone Other (See Comments)   BLISTERS IN MOUTH AND ON TONGUE      Medication List    STOP taking these medications   fentaNYL 25 MCG/HR patch Commonly known as:  DURAGESIC - dosed mcg/hr   lidocaine-prilocaine cream Commonly known as:  EMLA   polyethylene glycol powder powder Commonly known as:  GLYCOLAX/MIRALAX     TAKE these medications   amLODipine 10 MG tablet Commonly known as:  NORVASC Take 1 tablet (10 mg total) by mouth every evening.   clopidogrel 75 MG tablet Commonly known as:  PLAVIX Take 75 mg by mouth daily.   diphenoxylate-atropine 2.5-0.025 MG tablet Commonly known as:  LOMOTIL Take 1 tablet by mouth 4 (four) times daily as needed for diarrhea or loose stools. Take it along with immodium   isosorbide mononitrate 30 MG 24 hr tablet Commonly known as:  IMDUR Take 1 tablet (30 mg total) by  mouth daily.   lisinopril 10 MG tablet Commonly known as:  PRINIVIL,ZESTRIL Take 10 mg by mouth daily.   nitroGLYCERIN 0.4 MG SL tablet Commonly known as:  NITROSTAT Place 1 tablet (0.4 mg total) under the tongue every 5 (five) minutes as needed for chest pain.   ondansetron 8 MG tablet Commonly known as:  ZOFRAN Take 1 tablet (8 mg total) by mouth 2 (two) times daily as needed (Nausea or vomiting).   oxyCODONE 5 MG immediate release  tablet Commonly known as:  Oxy IR/ROXICODONE Take 1 tablet (5 mg total) by mouth every 4 (four) hours as needed for severe pain.   potassium chloride SA 20 MEQ tablet Commonly known as:  K-DUR,KLOR-CON 1 pill twice a day   prochlorperazine 10 MG tablet Commonly known as:  COMPAZINE Take 1 tablet (10 mg total) by mouth every 6 (six) hours as needed (Nausea or vomiting).   traMADol 50 MG tablet Commonly known as:  ULTRAM Take 50 mg by mouth every 6 (six) hours as needed for moderate pain.        SignedChristene Lye 12/08/2016, 9:44 AM

## 2016-12-11 DIAGNOSIS — E041 Nontoxic single thyroid nodule: Secondary | ICD-10-CM

## 2016-12-11 HISTORY — DX: Nontoxic single thyroid nodule: E04.1

## 2016-12-12 ENCOUNTER — Encounter: Payer: Self-pay | Admitting: General Surgery

## 2016-12-13 ENCOUNTER — Ambulatory Visit (INDEPENDENT_AMBULATORY_CARE_PROVIDER_SITE_OTHER): Payer: BLUE CROSS/BLUE SHIELD | Admitting: General Surgery

## 2016-12-13 ENCOUNTER — Encounter: Payer: Self-pay | Admitting: General Surgery

## 2016-12-13 VITALS — BP 126/82 | HR 86 | Resp 14 | Ht 67.0 in | Wt 133.0 lb

## 2016-12-13 DIAGNOSIS — C20 Malignant neoplasm of rectum: Secondary | ICD-10-CM

## 2016-12-13 NOTE — Progress Notes (Signed)
Patient ID: Rachel [name], female   DOB: 12/07/1956, 61 y.o.   MRN: [id number]  Chief Complaint  Patient presents with  . Routine Post Op    colostomy    HPI Rachel [name] is a 61 y.o. female here today for her post op colostomy done on  11/23/2016. Patient states she is doing okay. Patient states she is not eating good and very worried all the time.  Colostomy is working well. She is worried primarily about finances as her insurance benefits expired as of few days ago. I have reviewed the history of present illness with the patient.  Marland KitchenHPI  Past Medical History:  Diagnosis Date  . Arthritis   . Body mass index (BMI) of 24.0-24.9 in adult   . Cardiovascular disease   . Current every day smoker    a. ~35 years - > has cut down to < 3/4 ppd  . Headache   . History of UTI   . Hypertension   . Midsternal chest pain    a. 05/2014 Stress Echo: Ex time: 8:09, ECG w/o acute changes, EF nl with possible mid and apical anterior HK-->cath recommended.  . Port-a-cath in place    RIGHT SIDE  . Rectal cancer (Rachel [name]) 2017  . Stroke ([place])    07/2016 tia no neurology followup now seen by pcp  . Syncope and collapse     Past Surgical History:  Procedure Laterality Date  . ABDOMINAL HYSTERECTOMY    . BACK SURGERY     l 3,4,5  . CHOLECYSTECTOMY    . COLONOSCOPY WITH PROPOFOL N/A 08/15/2016   Procedure: COLONOSCOPY WITH PROPOFOL;  Surgeon: Lollie Sails, MD;  Location: [facility] ENDOSCOPY;  Service: Endoscopy;  Laterality: N/A;  . COLONOSCOPY WITH PROPOFOL N/A 11/08/2016   Procedure: COLONOSCOPY WITH PROPOFOL;  Surgeon: Christene Lye, MD;  Location: [facility] ENDOSCOPY;  Service: Endoscopy;  Laterality: N/A;  . COLOSTOMY N/A 11/23/2016   Procedure: SIGMOID COLOSTOMY;  Surgeon: Christene Lye, MD;  Location: [facility] ORS;  Service: General;  Laterality: N/A;  . ERCP    . LAPAROSCOPY N/A 11/23/2016   Procedure: LAPAROSCOPY DIAGNOSTIC;  Surgeon: Christene Lye, MD;  Location: [facility]  ORS;  Service: General;  Laterality: N/A;  . PARTIAL HYSTERECTOMY    . PORTACATH PLACEMENT N/A 08/18/2016   Procedure: INSERTION PORT-A-CATH;  Surgeon: Christene Lye, MD;  Location: [facility] ORS;  Service: General;  Laterality: N/A;    Family History  Problem Relation Age of Onset  . Breast cancer Mother   . Hyperlipidemia Father   . Heart disease Paternal Grandmother   . Stroke Maternal Grandfather     Social History Social History  Substance Use Topics  . Smoking status: Former Smoker    Packs/day: 0.25    Years: 35.00    Types: Cigarettes    Quit date: 08/11/2016  . Smokeless tobacco: Never Used  . Alcohol use No     Comment: occasional    Allergies  Allergen Reactions  . Prednisone Other (See Comments)    BLISTERS IN MOUTH AND ON TONGUE    Current Outpatient Prescriptions  Medication Sig Dispense Refill  . amLODipine (NORVASC) 10 MG tablet Take 1 tablet (10 mg total) by mouth every evening. 30 tablet 0  . clopidogrel (PLAVIX) 75 MG tablet Take 75 mg by mouth daily.    . diphenoxylate-atropine (LOMOTIL) 2.5-0.025 MG tablet Take 1 tablet by mouth 4 (four) times daily as needed for diarrhea or loose stools. Take it  along with immodium 30 tablet 0  . isosorbide mononitrate (IMDUR) 30 MG 24 hr tablet Take 1 tablet (30 mg total) by mouth daily. 90 tablet 3  . lisinopril (PRINIVIL,ZESTRIL) 10 MG tablet Take 10 mg by mouth daily.    . nitroGLYCERIN (NITROSTAT) 0.4 MG SL tablet Place 1 tablet (0.4 mg total) under the tongue every 5 (five) minutes as needed for chest pain. 25 tablet 6  . ondansetron (ZOFRAN) 8 MG tablet Take 1 tablet (8 mg total) by mouth 2 (two) times daily as needed (Nausea or vomiting). 30 tablet 1  . oxyCODONE (OXY IR/ROXICODONE) 5 MG immediate release tablet Take 1 tablet (5 mg total) by mouth every 4 (four) hours as needed for severe pain. 20 tablet 0  . potassium chloride SA (K-DUR,KLOR-CON) 20 MEQ tablet 1 pill twice a day 30 tablet 3  . prochlorperazine  (COMPAZINE) 10 MG tablet Take 1 tablet (10 mg total) by mouth every 6 (six) hours as needed (Nausea or vomiting). 30 tablet 1  . traMADol (ULTRAM) 50 MG tablet Take 50 mg by mouth every 6 (six) hours as needed for moderate pain.     No current facility-administered medications for this visit.     Review of Systems Review of Systems  Blood pressure 126/82, pulse 86, resp. rate 14, height 5\' 7"  (1.702 m), weight 133 lb (60.3 kg).  Physical Exam Physical Exam  Constitutional: She is oriented to person, place, and time. She appears well-developed and well-nourished.  Eyes: Conjunctivae are normal. No scleral icterus.  Neck: Neck supple.  Cardiovascular: Normal rate, regular rhythm and normal heart sounds.   Pulmonary/Chest: Effort normal and breath sounds normal.  Lymphadenopathy:    She has no cervical adenopathy.  Neurological: She is alert and oriented to person, place, and time.  Skin: Skin is warm and dry.  Colostomy appears well matured and clean. Abdomen is soft, non tender. Good bowel sounds  Data Reviewed Prior notes reviewed  Assessment    CA rectum. Completed chemo/radiation. Sacral area pain which is some better after colostomy. Discussed her concerns. Will have cancer Center evauate for financial assistance. She may benefit with a mild anti depressant-will have dr. Rogue Bussing evaluate for this.    Plan    Patient to see Dr. Rogue Bussing.   Return in three weeks.       This information has been scribed by [name] CMA.  [name],[name] 12/13/2016, 4:21 PM

## 2016-12-13 NOTE — Patient Instructions (Signed)
Return ion three weeks.

## 2016-12-15 ENCOUNTER — Inpatient Hospital Stay: Payer: BLUE CROSS/BLUE SHIELD | Attending: Internal Medicine | Admitting: Internal Medicine

## 2016-12-15 ENCOUNTER — Inpatient Hospital Stay: Payer: BLUE CROSS/BLUE SHIELD

## 2016-12-15 VITALS — BP 115/77 | HR 71 | Temp 97.6°F | Wt 133.0 lb

## 2016-12-15 DIAGNOSIS — Z8673 Personal history of transient ischemic attack (TIA), and cerebral infarction without residual deficits: Secondary | ICD-10-CM | POA: Insufficient documentation

## 2016-12-15 DIAGNOSIS — F32A Depression, unspecified: Secondary | ICD-10-CM

## 2016-12-15 DIAGNOSIS — Z599 Problem related to housing and economic circumstances, unspecified: Secondary | ICD-10-CM

## 2016-12-15 DIAGNOSIS — C775 Secondary and unspecified malignant neoplasm of intrapelvic lymph nodes: Principal | ICD-10-CM

## 2016-12-15 DIAGNOSIS — F329 Major depressive disorder, single episode, unspecified: Secondary | ICD-10-CM | POA: Insufficient documentation

## 2016-12-15 DIAGNOSIS — E041 Nontoxic single thyroid nodule: Secondary | ICD-10-CM | POA: Insufficient documentation

## 2016-12-15 DIAGNOSIS — C20 Malignant neoplasm of rectum: Secondary | ICD-10-CM | POA: Diagnosis not present

## 2016-12-15 DIAGNOSIS — F17210 Nicotine dependence, cigarettes, uncomplicated: Secondary | ICD-10-CM | POA: Diagnosis not present

## 2016-12-15 DIAGNOSIS — E876 Hypokalemia: Secondary | ICD-10-CM | POA: Diagnosis not present

## 2016-12-15 DIAGNOSIS — Z598 Other problems related to housing and economic circumstances: Secondary | ICD-10-CM

## 2016-12-15 DIAGNOSIS — I10 Essential (primary) hypertension: Secondary | ICD-10-CM | POA: Diagnosis not present

## 2016-12-15 DIAGNOSIS — I2510 Atherosclerotic heart disease of native coronary artery without angina pectoris: Secondary | ICD-10-CM | POA: Insufficient documentation

## 2016-12-15 DIAGNOSIS — Z933 Colostomy status: Secondary | ICD-10-CM | POA: Insufficient documentation

## 2016-12-15 DIAGNOSIS — Z79899 Other long term (current) drug therapy: Secondary | ICD-10-CM | POA: Diagnosis not present

## 2016-12-15 LAB — CBC WITH DIFFERENTIAL/PLATELET
BASOS PCT: 0 %
Basophils Absolute: 0 10*3/uL (ref 0–0.1)
EOS ABS: 0.2 10*3/uL (ref 0–0.7)
EOS PCT: 4 %
HCT: 36.2 % (ref 35.0–47.0)
HEMOGLOBIN: 12.2 g/dL (ref 12.0–16.0)
Lymphocytes Relative: 15 %
Lymphs Abs: 0.9 10*3/uL — ABNORMAL LOW (ref 1.0–3.6)
MCH: 30.4 pg (ref 26.0–34.0)
MCHC: 33.6 g/dL (ref 32.0–36.0)
MCV: 90.6 fL (ref 80.0–100.0)
MONOS PCT: 8 %
Monocytes Absolute: 0.5 10*3/uL (ref 0.2–0.9)
NEUTROS PCT: 73 %
Neutro Abs: 4.2 10*3/uL (ref 1.4–6.5)
PLATELETS: 187 10*3/uL (ref 150–440)
RBC: 4 MIL/uL (ref 3.80–5.20)
RDW: 15 % — AB (ref 11.5–14.5)
WBC: 5.8 10*3/uL (ref 3.6–11.0)

## 2016-12-15 LAB — COMPREHENSIVE METABOLIC PANEL
ALT: 9 U/L — ABNORMAL LOW (ref 14–54)
ANION GAP: 9 (ref 5–15)
AST: 19 U/L (ref 15–41)
Albumin: 3.4 g/dL — ABNORMAL LOW (ref 3.5–5.0)
Alkaline Phosphatase: 78 U/L (ref 38–126)
BUN: 10 mg/dL (ref 6–20)
CHLORIDE: 99 mmol/L — AB (ref 101–111)
CO2: 31 mmol/L (ref 22–32)
Calcium: 8.8 mg/dL — ABNORMAL LOW (ref 8.9–10.3)
Creatinine, Ser: 0.61 mg/dL (ref 0.44–1.00)
GFR calc non Af Amer: >60 mL/min (ref >60)
Glucose, Bld: 108 mg/dL — ABNORMAL HIGH (ref 65–99)
POTASSIUM: 2.3 mmol/L — AB (ref 3.5–5.1)
SODIUM: 139 mmol/L (ref 135–145)
Total Bilirubin: 0.4 mg/dL (ref 0.3–1.2)
Total Protein: 7 g/dL (ref 6.5–8.1)

## 2016-12-15 MED ORDER — HEPARIN SOD (PORK) LOCK FLUSH 100 UNIT/ML IV SOLN
500.0000 [IU] | Freq: Once | INTRAVENOUS | Status: AC
  Administered 2016-12-15: 500 [IU] via INTRAVENOUS
  Filled 2016-12-15: qty 5

## 2016-12-15 MED ORDER — SODIUM CHLORIDE 0.9% FLUSH
10.0000 mL | INTRAVENOUS | Status: DC | PRN
  Administered 2016-12-15: 10 mL via INTRAVENOUS
  Filled 2016-12-15: qty 10

## 2016-12-15 NOTE — Progress Notes (Signed)
1450- Critical value called to Nira Conn, RN from [place] in cancer ctr lab read back process performed with lab tech. Potassium 2.3.    1455-Read back process performed with DR. Rogue Bussing

## 2016-12-15 NOTE — Progress Notes (Signed)
[facility] OFFICE PROGRESS NOTE  Patient Care Team: Lynnell Jude, MD as PCP - General (Family Medicine) Seeplaputhur Robinette Haines, MD (General Surgery)  No matching staging information was found for the patient.   Oncology History   # SEP 2017- RECTAL CA mod diff adeno; STAGE III [no EUS; positive ~6m LN; Dr.sankar] CEA-7; Sep 21st START 5FU-RT [finished NOV 2017]  # Right thyroid mass/PET positive-   # smoker/ TIA [aug 2017- Plavix]   # Will order MMR/Mol testing on surgical resection  # Multiple family members-malignancy- await MMR on surgical resection specimen.      [place]   08/16/2016 Initial Diagnosis    Rectal cancer [facility]as[facility]el[facility]       INTERVAL HISTORY:  PYATZIL CLIPPINGER89y.o.  female patient with rectal cancer is Currently post chemoradiation Nov 2017. Patient had a colonoscopy in mid November that showed- unfortunately minimal response to chemoradiation. Not mobile. Given the significant pain patient underwent diverting colostomy. She is awaiting a few more weeks of recovery to proceed with definitive surgery.   Patient feels depressed. She is worried about her work situation. Difficulty sleeping. Poor appetite. She notes that the pain is improved since she had a diverting colostomy. Patient using tramadol 1 pill every 12 hours as needed only.  Denies any diarrhea. Denies any blood in stools. No nausea or vomiting. No sores in the mouth. Appetite is fair.  Otherwise no chest pain or shortness of breath or cough.   REVIEW OF SYSTEMS:  A complete 10 point review of system is done which is negative except mentioned above/history of present illness.   PAST MEDICAL HISTORY :  Past Medical History:  Diagnosis Date  . Arthritis   . Body mass index (BMI) of 24.0-24.9 in adult   . Cardiovascular disease   . Current every day smoker    a. ~35 years - > has cut down to < 3/4  ppd  . Headache   . History of UTI   . Hypertension   . Midsternal chest pain    a. 05/2014 Stress Echo: Ex time: 8:09, ECG w/o acute changes, EF nl with possible mid and apical anterior HK-->cath recommended.  . Port-a-cath in place    RIGHT SIDE  . [place] cancer [place]  . [place]    07/2016 tia no neurology followup now seen by pcp  . Syncope and collapse     PAST SURGICAL HISTORY :   Past Surgical History:  Procedure Laterality Date  . ABDOMINAL HYSTERECTOMY    . BACK SURGERY     l 3,4,5  . CHOLECYSTECTOMY    . COLONOSCOPY WITH PROPOFOL N/A 08/15/2016   Procedure: COLONOSCOPY WITH PROPOFOL;  Surgeon: MLollie Sails MD;  [facility];  Service: Endoscopy;  Laterality: N/A;  . COLONOSCOPY WITH PROPOFOL N/A 11/08/2016   Procedure: COLONOSCOPY WITH PROPOFOL;  Surgeon: SChristene Lye MD;  Location: [facility] ENDOSCOPY;  Service: Endoscopy;  Laterality: N/A;  . COLOSTOMY N/A 11/23/2016   Procedure: SIGMOID COLOSTOMY;  Surgeon: SChristene Lye MD;  Location: [facility] ORS;  Service: General;  Laterality: N/A;  . ERCP    . LAPAROSCOPY N/A 11/23/2016   Procedure: LAPAROSCOPY DIAGNOSTIC;  Surgeon: SChristene Lye MD;  Location: [facility] ORS;  Service: General;  Laterality: N/A;  . PARTIAL HYSTERECTOMY    . PORTACATH PLACEMENT N/A 08/18/2016   Procedure: INSERTION PORT-A-CATH;  Surgeon: SAndreas Newport  Jamal Collin, MD;  Location: [facility] ORS;  Service: General;  Laterality: N/A;    FAMILY HISTORY :  Mom- gall bladder ca; breast cancer- 46s; mat grand ma- stomach cancer- 67s; sblings- 2 no cancers; 2 daughters- one daughter? Cervical  Family History  Problem Relation Age of Onset  . Breast cancer Mother   . Hyperlipidemia Father   . Heart disease Paternal Grandmother   . Stroke Maternal Grandfather     SOCIAL HISTORY:   Social History  Substance Use Topics  . Smoking status: Former Smoker    Packs/day: 0.25    Years: 35.00    Types: Cigarettes    Quit  date: 08/11/2016  . Smokeless tobacco: Never Used  . Alcohol use No     Comment: occasional    ALLERGIES:  is allergic to prednisone.  MEDICATIONS:  Current Outpatient Prescriptions  Medication Sig Dispense Refill  . amLODipine (NORVASC) 10 MG tablet Take 1 tablet (10 mg total) by mouth every evening. 30 tablet 0  . clopidogrel (PLAVIX) 75 MG tablet Take 75 mg by mouth daily.    . diphenoxylate-atropine (LOMOTIL) 2.5-0.025 MG tablet Take 1 tablet by mouth 4 (four) times daily as needed for diarrhea or loose stools. Take it along with immodium 30 tablet 0  . isosorbide mononitrate (IMDUR) 30 MG 24 hr tablet Take 1 tablet (30 mg total) by mouth daily. 90 tablet 3  . lisinopril (PRINIVIL,ZESTRIL) 10 MG tablet Take 10 mg by mouth daily.    . nitroGLYCERIN (NITROSTAT) 0.4 MG SL tablet Place 1 tablet (0.4 mg total) under the tongue every 5 (five) minutes as needed for chest pain. 25 tablet 6  . ondansetron (ZOFRAN) 8 MG tablet Take 1 tablet (8 mg total) by mouth 2 (two) times daily as needed (Nausea or vomiting). 30 tablet 1  . prochlorperazine (COMPAZINE) 10 MG tablet Take 1 tablet (10 mg total) by mouth every 6 (six) hours as needed (Nausea or vomiting). 30 tablet 1  . traMADol (ULTRAM) 50 MG tablet Take 50 mg by mouth every 6 (six) hours as needed for moderate pain.     No current facility-administered medications for this visit.     PHYSICAL EXAMINATION: ECOG PERFORMANCE STATUS: 1 - Symptomatic but completely ambulatory  BP 115/77 (BP Location: Right Arm, Patient Position: Sitting)   Pulse 71   Temp 97.6 F (36.4 C) (Tympanic)   Wt 133 lb (60.3 kg)   BMI 20.83 kg/m   Filed Weights   12/15/16 1447  Weight: 133 lb (60.3 kg)    GENERAL: Well-nourished well-developed; Alert, no distress and comfortable.   With family.  EYES: no pallor or icterus OROPHARYNX: no thrush or ulceration; good dentition  NECK: supple, no masses felt LYMPH:  no palpable lymphadenopathy in the cervical,  axillary or inguinal regions LUNGS: clear to auscultation and  No wheeze or crackles HEART/CVS: regular rate & rhythm and no murmurs; No lower extremity edema ABDOMEN:abdomen soft, non-tender and normal bowel sounds Musculoskeletal:no cyanosis of digits and no clubbing  PSYCH: alert & oriented x 3 with fluent speech NEURO: no focal motor/sensory deficits SKIN:  no rashes or significant lesions  LABORATORY DATA:  I have reviewed the data as listed    Component Value Date/Time   NA 139 12/15/2016 1410   NA 144 07/25/2014 0444   K 2.3 (LL) 12/15/2016 1410   K 3.7 07/25/2014 0444   CL 99 (L) 12/15/2016 1410   CL 107 07/25/2014 0444   CO2 31 12/15/2016 1410  CO2 30 07/25/2014 0444   GLUCOSE 108 (H) 12/15/2016 1410   GLUCOSE 87 07/25/2014 0444   BUN 10 12/15/2016 1410   BUN 8 07/25/2014 0444   CREATININE 0.61 12/15/2016 1410   CREATININE 0.69 07/25/2014 0444   CALCIUM 8.8 (L) 12/15/2016 1410   CALCIUM 8.4 (L) 07/25/2014 0444   PROT 7.0 12/15/2016 1410   PROT 6.5 07/25/2014 0444   ALBUMIN 3.4 (L) 12/15/2016 1410   ALBUMIN 2.6 (L) 07/25/2014 0444   AST 19 12/15/2016 1410   AST 96 (H) 07/25/2014 0444   ALT 9 (L) 12/15/2016 1410   ALT 123 (H) 07/25/2014 0444   ALKPHOS 78 12/15/2016 1410   ALKPHOS 189 (H) 07/25/2014 0444   BILITOT 0.4 12/15/2016 1410   BILITOT 2.0 (H) 07/25/2014 0444   GFRNONAA >60 12/15/2016 1410   GFRNONAA >60 07/25/2014 0444   GFRAA >60 12/15/2016 1410   GFRAA >60 07/25/2014 0444    No results found for: SPEP, UPEP  Lab Results  Component Value Date   WBC 5.8 12/15/2016   NEUTROABS 4.2 12/15/2016   HGB 12.2 12/15/2016   HCT 36.2 12/15/2016   MCV 90.6 12/15/2016   PLT 187 12/15/2016      Chemistry      Component Value Date/Time   NA 139 12/15/2016 1410   NA 144 07/25/2014 0444   K 2.3 (LL) 12/15/2016 1410   K 3.7 07/25/2014 0444   CL 99 (L) 12/15/2016 1410   CL 107 07/25/2014 0444   CO2 31 12/15/2016 1410   CO2 30 07/25/2014 0444   BUN 10  12/15/2016 1410   BUN 8 07/25/2014 0444   CREATININE 0.61 12/15/2016 1410   CREATININE 0.69 07/25/2014 0444      Component Value Date/Time   CALCIUM 8.8 (L) 12/15/2016 1410   CALCIUM 8.4 (L) 07/25/2014 0444   ALKPHOS 78 12/15/2016 1410   ALKPHOS 189 (H) 07/25/2014 0444   AST 19 12/15/2016 1410   AST 96 (H) 07/25/2014 0444   ALT 9 (L) 12/15/2016 1410   ALT 123 (H) 07/25/2014 0444   BILITOT 0.4 12/15/2016 1410   BILITOT 2.0 (H) 07/25/2014 0444     IMPRESSION: 1. Ill-defined circumferential wall thickening in the distal rectum with perirectal edema/inflammation. The soft tissue nodule contiguous with the right aspect of the rectum on the prior study, likely representing direct distention, has decreased in the interval. Small perirectal lymph nodes persist. 2. No evidence for metastatic disease elsewhere in the abdomen or pelvis   Electronically Signed   By: Misty Stanley M.D.   On: 11/15/2016 17:23  RADIOGRAPHIC STUDIES: I have personally reviewed the radiological images as listed and agreed with the findings in the report. No results found.   ASSESSMENT & PLAN:  Rectal cancer metastasized to intrapelvic lymph node ([place]) # Rectal  Cancer- stage III- s/p neoadjuvant chemoradiation with 5-FU continuous infusion with radiation; Nov 29th- colonoscopy minimal response; not mobile. However biopsy negative for any active malignancy- only radiation changes/necrosis. Currently status post Diverting colostomy. Patient awaiting a few more weeks to proceed with definitive surgery.  # Pain in rectal region- improved on tramadol q 12 hours prn.   # ? Depression/ ? Sec to financial issues/work situation. Recommend Remeron daily at bedtime. Discussed with social worker; Elease Etienne. He will reach out to the patient. Patient will multiple issues/will be unable to work for at least 6 months. She will drop of paperwork for [place] to sign.   # Hypokalemia-K 2.3.  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  # follow up in 4 weeks/labs/CEA  # follow up with me in mid jan 2018/labs/port flush.    Orders Placed This Encounter  Procedures  . Ambulatory referral to Social Work    Referral Priority:   Routine    Referral Type:   Consultation    Referral Reason:   Specialty Services Required    Referred to Provider:   Gillis Santa    Number of Visits Requested:   1   All questions were answered. The patient knows to call the clinic with any problems, questions or concerns.      Cammie Sickle, MD 12/15/2016 3:13 PM

## 2016-12-15 NOTE — Assessment & Plan Note (Addendum)
#   Rectal  Cancer- stage III- s/p neoadjuvant chemoradiation with 5-FU continuous infusion with radiation; Nov 29th- colonoscopy minimal response; not mobile. However biopsy negative for any active malignancy- only radiation changes/necrosis. Currently status post Diverting colostomy. Patient awaiting a few more weeks to proceed with definitive surgery.  # Pain in rectal region- improved on tramadol q 12 hours prn.   # ? Depression/ ? Sec to financial issues/work situation. Recommend Remeron daily at bedtime. Discussed with social worker; Rachel [name]. He will reach out to the patient. Patient will multiple issues/will be unable to work for at least 6 months. She will drop of paperwork for [place] to sign.   # Hypokalemia-K 2.3.  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  # follow up in 4 weeks/labs/CEA  # follow up with me in mid jan 2018/labs/port flush.

## 2016-12-15 NOTE — Progress Notes (Signed)
Patient here today for follow up.  Patient concerned about worsening SOB, and not sleeping at night

## 2016-12-16 LAB — CEA: CEA: 3.4 ng/mL (ref 0.0–4.7)

## 2016-12-18 NOTE — Progress Notes (Unsigned)
PSN has met with patient and family, as well as encountered several phone calls with son and daughter. Patient receiving help with various monthly expenses, including help with monthly COBRA payments.

## 2016-12-28 ENCOUNTER — Other Ambulatory Visit: Payer: BLUE CROSS/BLUE SHIELD

## 2016-12-28 ENCOUNTER — Ambulatory Visit: Payer: BLUE CROSS/BLUE SHIELD | Admitting: Internal Medicine

## 2017-01-02 ENCOUNTER — Encounter: Payer: Self-pay | Admitting: *Deleted

## 2017-01-04 ENCOUNTER — Ambulatory Visit: Payer: MEDICAID | Admitting: General Surgery

## 2017-01-12 ENCOUNTER — Other Ambulatory Visit: Payer: Self-pay

## 2017-01-12 ENCOUNTER — Inpatient Hospital Stay: Payer: BLUE CROSS/BLUE SHIELD | Attending: Internal Medicine

## 2017-01-12 ENCOUNTER — Inpatient Hospital Stay (HOSPITAL_BASED_OUTPATIENT_CLINIC_OR_DEPARTMENT_OTHER): Payer: BLUE CROSS/BLUE SHIELD | Admitting: Internal Medicine

## 2017-01-12 VITALS — BP 145/82 | HR 82 | Temp 97.5°F | Wt 136.0 lb

## 2017-01-12 DIAGNOSIS — C775 Secondary and unspecified malignant neoplasm of intrapelvic lymph nodes: Principal | ICD-10-CM

## 2017-01-12 DIAGNOSIS — Z9221 Personal history of antineoplastic chemotherapy: Secondary | ICD-10-CM | POA: Insufficient documentation

## 2017-01-12 DIAGNOSIS — Z923 Personal history of irradiation: Secondary | ICD-10-CM | POA: Insufficient documentation

## 2017-01-12 DIAGNOSIS — E876 Hypokalemia: Secondary | ICD-10-CM | POA: Insufficient documentation

## 2017-01-12 DIAGNOSIS — E041 Nontoxic single thyroid nodule: Secondary | ICD-10-CM | POA: Diagnosis not present

## 2017-01-12 DIAGNOSIS — C20 Malignant neoplasm of rectum: Secondary | ICD-10-CM

## 2017-01-12 DIAGNOSIS — Z7902 Long term (current) use of antithrombotics/antiplatelets: Secondary | ICD-10-CM | POA: Insufficient documentation

## 2017-01-12 DIAGNOSIS — F17210 Nicotine dependence, cigarettes, uncomplicated: Secondary | ICD-10-CM | POA: Diagnosis not present

## 2017-01-12 DIAGNOSIS — I10 Essential (primary) hypertension: Secondary | ICD-10-CM | POA: Diagnosis not present

## 2017-01-12 DIAGNOSIS — Z933 Colostomy status: Secondary | ICD-10-CM | POA: Diagnosis not present

## 2017-01-12 DIAGNOSIS — Z79899 Other long term (current) drug therapy: Secondary | ICD-10-CM | POA: Insufficient documentation

## 2017-01-12 DIAGNOSIS — I2510 Atherosclerotic heart disease of native coronary artery without angina pectoris: Secondary | ICD-10-CM | POA: Diagnosis not present

## 2017-01-12 DIAGNOSIS — Z8673 Personal history of transient ischemic attack (TIA), and cerebral infarction without residual deficits: Secondary | ICD-10-CM | POA: Diagnosis not present

## 2017-01-12 LAB — COMPREHENSIVE METABOLIC PANEL
ALBUMIN: 3.9 g/dL (ref 3.5–5.0)
ALK PHOS: 86 U/L (ref 38–126)
ALT: 9 U/L — ABNORMAL LOW (ref 14–54)
AST: 19 U/L (ref 15–41)
Anion gap: 6 (ref 5–15)
BUN: 8 mg/dL (ref 6–20)
CHLORIDE: 103 mmol/L (ref 101–111)
CO2: 30 mmol/L (ref 22–32)
CREATININE: 0.72 mg/dL (ref 0.44–1.00)
Calcium: 9.3 mg/dL (ref 8.9–10.3)
GFR calc non Af Amer: >60 mL/min (ref >60)
GLUCOSE: 105 mg/dL — AB (ref 65–99)
Potassium: 3.7 mmol/L (ref 3.5–5.1)
SODIUM: 139 mmol/L (ref 135–145)
Total Bilirubin: 0.3 mg/dL (ref 0.3–1.2)
Total Protein: 7.5 g/dL (ref 6.5–8.1)

## 2017-01-12 LAB — CBC WITH DIFFERENTIAL/PLATELET
BASOS ABS: 0.1 10*3/uL (ref 0–0.1)
Basophils Relative: 1 %
EOS ABS: 0.1 10*3/uL (ref 0–0.7)
EOS PCT: 2 %
HCT: 40.9 % (ref 35.0–47.0)
HEMOGLOBIN: 13.7 g/dL (ref 12.0–16.0)
Lymphocytes Relative: 16 %
Lymphs Abs: 0.8 10*3/uL — ABNORMAL LOW (ref 1.0–3.6)
MCH: 30.2 pg (ref 26.0–34.0)
MCHC: 33.5 g/dL (ref 32.0–36.0)
MCV: 90.2 fL (ref 80.0–100.0)
Monocytes Absolute: 0.4 10*3/uL (ref 0.2–0.9)
Monocytes Relative: 8 %
NEUTROS PCT: 73 %
Neutro Abs: 3.9 10*3/uL (ref 1.4–6.5)
PLATELETS: 183 10*3/uL (ref 150–440)
RBC: 4.53 MIL/uL (ref 3.80–5.20)
RDW: 14.6 % — ABNORMAL HIGH (ref 11.5–14.5)
WBC: 5.3 10*3/uL (ref 3.6–11.0)

## 2017-01-12 MED ORDER — HYDROCODONE-ACETAMINOPHEN 5-325 MG PO TABS: 1.0000 | ORAL_TABLET | Freq: Three times a day (TID) | ORAL | 0 refills | Status: DC | PRN

## 2017-01-12 MED ORDER — VENLAFAXINE HCL ER 37.5 MG PO CP24: 37.5000 mg | ORAL_CAPSULE | Freq: Every day | ORAL | 3 refills | Status: DC

## 2017-01-12 NOTE — Progress Notes (Signed)
[place] OFFICE PROGRESS NOTE  Patient Care Team: Lynnell Jude, MD as PCP - General (Family Medicine) Seeplaputhur Robinette Haines, MD (General Surgery)  Cancer Staging No matching staging information was found for the patient.   Oncology History   # SEP 2017- RECTAL CA mod diff adeno; STAGE III [no EUS; positive ~30m LN; Dr.sankar] CEA-7; Sep 21st START 5FU-RT [finished NOV 2017]  # Right thyroid mass/PET positive-   # smoker/ TIA [aug 2017- Plavix]   # Will order MMR/Mol testing on surgical resection  # Multiple family members-malignancy- await MMR on surgical resection specimen.      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  female patient with rectal cancer is Currently post chemoradiation Nov 2017- Is currently status post diverting colostomy for ongoing rectal pain.  Patient interim met with social worker to help with the financial issues. She is awaiting Medicaid approval.  She complains of moderate to severe pain right lower quadrant intermittently over the last many weeks. Patient has been taking tramadol only as needed. Patient did not get her antidepressant medication. She complains of intermittent blood in her colostomy bag.  Denies any diarrhea. Denies any blood in stools. No nausea or vomiting. No sores in the mouth. Appetite is fair.  Otherwise no chest pain or shortness of breath or cough.   REVIEW OF SYSTEMS:  A complete 10 point review of system is done which is negative except mentioned above/history of present illness.   PAST MEDICAL HISTORY :  Past Medical History:  Diagnosis Date  . Arthritis   . Body mass index (BMI) of 24.0-24.9 in adult   . Cardiovascular disease   . Current every day smoker    a. ~35 years - > has cut down to < 3/4 ppd  . Headache   . History of UTI   .  Hypertension   . Midsternal chest pain    a. 05/2014 Stress Echo: Ex time: 8:09, ECG w/o acute changes, EF nl with possible mid and apical anterior HK-->cath recommended.  . Port-a-cath in place    RIGHT SIDE  . [place]  . [place]    07/2016 tia no neurology followup now seen by pcp  . Syncope and collapse     PAST SURGICAL HISTORY :   Past Surgical History:  Procedure Laterality Date  . ABDOMINAL HYSTERECTOMY    . BACK SURGERY     l 3,4,5  . CHOLECYSTECTOMY    . COLONOSCOPY WITH PROPOFOL N/A 08/15/2016   Procedure: COLONOSCOPY WITH PROPOFOL;  Surgeon: MLollie Sails MD;  [facility]  Service: Endoscopy;  Laterality: N/A;  . COLONOSCOPY WITH PROPOFOL N/A 11/08/2016   Procedure: COLONOSCOPY WITH PROPOFOL;  Surgeon: SChristene Lye MD;  Location: [facility] ENDOSCOPY;  Service: Endoscopy;  Laterality: N/A;  . COLOSTOMY N/A 11/23/2016   Procedure: SIGMOID COLOSTOMY;  Surgeon: SChristene Lye MD;  Location: [facility] ORS;  Service: General;  Laterality: N/A;  . ERCP    . LAPAROSCOPY N/A 11/23/2016   Procedure: LAPAROSCOPY DIAGNOSTIC;  Surgeon: SChristene Lye MD;  Location: [facility] ORS;  Service: General;  Laterality: N/A;  . PARTIAL HYSTERECTOMY    . PORTACATH PLACEMENT N/A 08/18/2016   Procedure: INSERTION PORT-A-CATH;  Surgeon: SChristene Lye MD;  Location: [facility] ORS;  Service: General;  Laterality: N/A;    FAMILY HISTORY :  Mom- gall bladder ca; breast cancer- 42s; mat grand ma- stomach cancer- 55s; sblings- 2 no cancers; 2 daughters- one daughter? Cervical  Family History  Problem Relation Age of Onset  . Breast cancer Mother   . Hyperlipidemia Father   . Heart disease Paternal Grandmother   . Stroke Maternal Grandfather     SOCIAL HISTORY:   Social History  Substance Use Topics  . Smoking status: Former Smoker    Packs/day: 0.25    Years: 35.00    Types: Cigarettes    Quit date: 08/11/2016  . Smokeless tobacco: Never  Used  . Alcohol use No     Comment: occasional    ALLERGIES:  is allergic to prednisone.  MEDICATIONS:  Current Outpatient Prescriptions  Medication Sig Dispense Refill  . amLODipine (NORVASC) 10 MG tablet Take 1 tablet (10 mg total) by mouth every evening. 30 tablet 0  . clopidogrel (PLAVIX) 75 MG tablet Take 75 mg by mouth daily.    . diphenoxylate-atropine (LOMOTIL) 2.5-0.025 MG tablet Take 1 tablet by mouth 4 (four) times daily as needed for diarrhea or loose stools. Take it along with immodium 30 tablet 0  . isosorbide mononitrate (IMDUR) 30 MG 24 hr tablet Take 1 tablet (30 mg total) by mouth daily. 90 tablet 3  . KLOR-CON M20 20 MEQ tablet     . lisinopril (PRINIVIL,ZESTRIL) 10 MG tablet Take 10 mg by mouth daily.    . nitroGLYCERIN (NITROSTAT) 0.4 MG SL tablet Place 1 tablet (0.4 mg total) under the tongue every 5 (five) minutes as needed for chest pain. 25 tablet 6  . ondansetron (ZOFRAN) 8 MG tablet Take 1 tablet (8 mg total) by mouth 2 (two) times daily as needed (Nausea or vomiting). 30 tablet 1  . prochlorperazine (COMPAZINE) 10 MG tablet Take 1 tablet (10 mg total) by mouth every 6 (six) hours as needed (Nausea or vomiting). 30 tablet 1  . traMADol (ULTRAM) 50 MG tablet Take 50 mg by mouth every 6 (six) hours as needed for moderate pain.    [name] HYDROcodone-acetaminophen (NORCO/VICODIN) 5-325 MG tablet Take 1 tablet by mouth every 8 (eight) hours as needed for moderate pain. 90 tablet 0  . venlafaxine XR (EFFEXOR-XR) 37.5 MG 24 hr capsule Take 1 capsule (37.5 mg total) by mouth daily with breakfast. 30 capsule 3   No current facility-administered medications for this visit.     PHYSICAL EXAMINATION: ECOG PERFORMANCE STATUS: 1 - Symptomatic but completely ambulatory  BP (!) 145/82 (BP Location: Right Arm, Patient Position: Sitting)   Pulse 82   Temp 97.5 F (36.4 C) (Tympanic)   Wt 136 lb (61.7 kg)   BMI 21.30 kg/m   Filed Weights   01/12/17 1440  Weight: 136 lb (61.7  kg)    GENERAL: Well-nourished well-developed; Alert, no distress and comfortable.   With family.  EYES: no pallor or icterus OROPHARYNX: no thrush or ulceration; good dentition  NECK: supple, no masses felt LYMPH:  no palpable lymphadenopathy in the cervical, axillary or inguinal regions LUNGS: clear to auscultation and  No wheeze or crackles HEART/CVS: regular rate & rhythm and no murmurs; No lower extremity edema ABDOMEN:abdomen soft, non-tender and normal bowel sounds Musculoskeletal:no cyanosis of digits and no clubbing  PSYCH: alert & oriented x 3 with fluent speech NEURO: no focal motor/sensory deficits SKIN:  no rashes or significant lesions  LABORATORY DATA:  I have reviewed the data as  listed    Component Value Date/Time   NA 139 01/12/2017 1405   NA 144 07/25/2014 0444   K 3.7 01/12/2017 1405   K 3.7 07/25/2014 0444   CL 103 01/12/2017 1405   CL 107 07/25/2014 0444   CO2 30 01/12/2017 1405   CO2 30 07/25/2014 0444   GLUCOSE 105 (H) 01/12/2017 1405   GLUCOSE 87 07/25/2014 0444   BUN 8 01/12/2017 1405   BUN 8 07/25/2014 0444   CREATININE 0.72 01/12/2017 1405   CREATININE 0.69 07/25/2014 0444   CALCIUM 9.3 01/12/2017 1405   CALCIUM 8.4 (L) 07/25/2014 0444   PROT 7.5 01/12/2017 1405   PROT 6.5 07/25/2014 0444   ALBUMIN 3.9 01/12/2017 1405   ALBUMIN 2.6 (L) 07/25/2014 0444   AST 19 01/12/2017 1405   AST 96 (H) 07/25/2014 0444   ALT 9 (L) 01/12/2017 1405   ALT 123 (H) 07/25/2014 0444   ALKPHOS 86 01/12/2017 1405   ALKPHOS 189 (H) 07/25/2014 0444   BILITOT 0.3 01/12/2017 1405   BILITOT 2.0 (H) 07/25/2014 0444   GFRNONAA >60 01/12/2017 1405   GFRNONAA >60 07/25/2014 0444   GFRAA >60 01/12/2017 1405   GFRAA >60 07/25/2014 0444    No results found for: SPEP, UPEP  Lab Results  Component Value Date   WBC 5.3 01/12/2017   NEUTROABS 3.9 01/12/2017   HGB 13.7 01/12/2017   HCT 40.9 01/12/2017   MCV 90.2 01/12/2017   PLT 183 01/12/2017      Chemistry       Component Value Date/Time   NA 139 01/12/2017 1405   NA 144 07/25/2014 0444   K 3.7 01/12/2017 1405   K 3.7 07/25/2014 0444   CL 103 01/12/2017 1405   CL 107 07/25/2014 0444   CO2 30 01/12/2017 1405   CO2 30 07/25/2014 0444   BUN 8 01/12/2017 1405   BUN 8 07/25/2014 0444   CREATININE 0.72 01/12/2017 1405   CREATININE 0.69 07/25/2014 0444      Component Value Date/Time   CALCIUM 9.3 01/12/2017 1405   CALCIUM 8.4 (L) 07/25/2014 0444   ALKPHOS 86 01/12/2017 1405   ALKPHOS 189 (H) 07/25/2014 0444   AST 19 01/12/2017 1405   AST 96 (H) 07/25/2014 0444   ALT 9 (L) 01/12/2017 1405   ALT 123 (H) 07/25/2014 0444   BILITOT 0.3 01/12/2017 1405   BILITOT 2.0 (H) 07/25/2014 0444     IMPRESSION: 1. Ill-defined circumferential wall thickening in the distal rectum with perirectal edema/inflammation. The soft tissue nodule contiguous with the right aspect of the rectum on the prior study, likely representing direct distention, has decreased in the interval. Small perirectal lymph nodes persist. 2. No evidence for metastatic disease elsewhere in the abdomen or pelvis   Electronically Signed   By: Misty Stanley M.D.   On: 11/15/2016 17:23  RADIOGRAPHIC STUDIES: I have personally reviewed the radiological images as listed and agreed with the findings in the report. No results found.   ASSESSMENT & PLAN:  Rectal cancer metastasized to intrapelvic lymph node ([place]) # Rectal  Cancer- stage III- s/p neoadjuvant chemoradiation with 5-FU continuous infusion with radiation; Nov 29th- colonoscopy minimal response; not mobile. However biopsy negative for any active malignancy- only radiation changes/necrosis. Currently status post Diverting colostomy.  # Discussed Dr. Jamal Collin; recommend evaluating the patient early next week.   # Pain in rectal region- improved; However complaints of right lower quadrant pain question etiology. Recommend hydrocodone for now new prescription given. Also  follow-up with Dr. Jamal Collin.  # ? Depression/ ? Sec to financial issues/work situation. Recommend Effexor to also help quitting smoking. New Prescription sent. Patient will need at least 12 months to be off work to recover from surgery/chemotherapy.   # Counseled to quit smoking.  # Hypokalemia-K 2.3; improved. At 3.7.    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   # follow up in 4 weeks/ no labs/ port flush.   #Above plan discussed with the patient and her daughter.    No orders of the defined types were placed in this encounter.  All questions were answered. The patient knows to call the clinic with any problems, questions or concerns.      Cammie Sickle, MD 01/14/2017 12:24 PM

## 2017-01-12 NOTE — Assessment & Plan Note (Addendum)
#   Rectal  Cancer- stage III- s/p neoadjuvant chemoradiation with 5-FU continuous infusion with radiation; Nov 29th- colonoscopy minimal response; not mobile. However biopsy negative for any active malignancy- only radiation changes/necrosis. Currently status post Diverting colostomy.  # Discussed Dr. Jamal Collin; recommend evaluating the patient early next week.   # Pain in rectal region- improved; However complaints of right lower quadrant pain question etiology. Recommend hydrocodone for now new prescription given. Also follow-up with Dr. Jamal Collin.  # ? Depression/ ? Sec to financial issues/work situation. Recommend Effexor to also help quitting smoking. New Prescription sent. Patient will need at least 12 months to be off work to recover from surgery/chemotherapy.   # Counseled to quit smoking.  # Hypokalemia-K 2.3; improved. At 3.7.    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   # follow up in 4 weeks/ no labs/ port flush.   #Above plan discussed with the patient and her daughter.

## 2017-01-12 NOTE — Progress Notes (Signed)
Patient here today for follow up.  Patient is experiencing lower right abdominal pain, "streaks of warm sensation" running down right leg, hot flashes day and night, stool still coming from rectum and blood in colostomy bag.

## 2017-01-13 LAB — CEA: CEA: 4.3 ng/mL (ref 0.0–4.7)

## 2017-01-15 ENCOUNTER — Other Ambulatory Visit: Payer: Self-pay | Admitting: *Deleted

## 2017-01-15 MED ORDER — KLOR-CON M20 20 MEQ PO TBCR: 20.0000 meq | EXTENDED_RELEASE_TABLET | Freq: Two times a day (BID) | ORAL | 1 refills | Status: DC

## 2017-01-17 ENCOUNTER — Ambulatory Visit (INDEPENDENT_AMBULATORY_CARE_PROVIDER_SITE_OTHER): Payer: BLUE CROSS/BLUE SHIELD | Admitting: General Surgery

## 2017-01-17 ENCOUNTER — Encounter: Payer: Self-pay | Admitting: General Surgery

## 2017-01-17 VITALS — BP 138/84 | HR 80 | Resp 12 | Ht 66.0 in | Wt 139.0 lb

## 2017-01-17 DIAGNOSIS — C20 Malignant neoplasm of rectum: Secondary | ICD-10-CM

## 2017-01-17 MED ORDER — DOXYCYCLINE HYCLATE 100 MG PO CAPS: 100.0000 mg | ORAL_CAPSULE | Freq: Two times a day (BID) | ORAL | 0 refills | Status: DC

## 2017-01-17 NOTE — Patient Instructions (Signed)
Recommended removal of rectum to patient.  States that she would prefer to wait until her Medicaid insurance is available. Begin Rx Doxycycline BID x 10 days Follow-up in 2 weeks.

## 2017-01-17 NOTE — Progress Notes (Signed)
Patient ID: Rachel [name], female   DOB: 1956-07-19, 61 y.o.   MRN: [id number]  Chief Complaint  Patient presents with  . Follow-up    HPI Rachel [name] is a 61 y.o. female today for her folllow up colostomy done on 11/23/2016. Patient states she is doing "okay". She is having right lower quadrant pain, in the groin, for several weeks and it is worse with movement. She states that when she moves around a lot the stoma bleeds.  Denies nausea or vomiting. States rectal pain that she was having earlier is better. She is here with her 2 daughters. I have reviewed the history of present illness with the patient.   HPI  Past Medical History:  Diagnosis Date  . Arthritis   . Body mass index (BMI) of 24.0-24.9 in adult   . Cardiovascular disease   . Current every day smoker    a. ~35 years - > has cut down to < 3/4 ppd  . Headache   . History of UTI   . Hypertension   . Midsternal chest pain    a. 05/2014 Stress Echo: Ex time: 8:09, ECG w/o acute changes, EF nl with possible mid and apical anterior HK-->cath recommended.  . Port-a-cath in place    RIGHT SIDE  . Rectal cancer ([place]) 2017  . Stroke ([place])    07/2016 tia no neurology followup now seen by pcp  . Syncope and collapse     Past Surgical History:  Procedure Laterality Date  . ABDOMINAL HYSTERECTOMY    . BACK SURGERY     l 3,4,5  . CHOLECYSTECTOMY    . COLONOSCOPY WITH PROPOFOL N/A 08/15/2016   Procedure: COLONOSCOPY WITH PROPOFOL;  Surgeon: Lollie Sails, MD;  Location: [facility] ENDOSCOPY;  Service: Endoscopy;  Laterality: N/A;  . COLONOSCOPY WITH PROPOFOL N/A 11/08/2016   Procedure: COLONOSCOPY WITH PROPOFOL;  Surgeon: Christene Lye, MD;  Location: [facility] ENDOSCOPY;  Service: Endoscopy;  Laterality: N/A;  . COLOSTOMY N/A 11/23/2016   Procedure: SIGMOID COLOSTOMY;  Surgeon: Christene Lye, MD;  Location: [facility] ORS;  Service: General;  Laterality: N/A;  . ERCP    . LAPAROSCOPY N/A 11/23/2016   Procedure:  LAPAROSCOPY DIAGNOSTIC;  Surgeon: Christene Lye, MD;  Location: [facility] ORS;  Service: General;  Laterality: N/A;  . PARTIAL HYSTERECTOMY    . PORTACATH PLACEMENT N/A 08/18/2016   Procedure: INSERTION PORT-A-CATH;  Surgeon: Christene Lye, MD;  Location: [facility] ORS;  Service: General;  Laterality: N/A;    Family History  Problem Relation Age of Onset  . Breast cancer Mother   . Hyperlipidemia Father   . Heart disease Paternal Grandmother   . Stroke Maternal Grandfather     Social History Social History  Substance Use Topics  . Smoking status: Former Smoker    Packs/day: 0.25    Years: 35.00    Types: Cigarettes    Quit date: 08/11/2016  . Smokeless tobacco: Never Used  . Alcohol use No     Comment: occasional    Allergies  Allergen Reactions  . Prednisone Other (See Comments)    BLISTERS IN MOUTH AND ON TONGUE    Current Outpatient Prescriptions  Medication Sig Dispense Refill  . amLODipine (NORVASC) 10 MG tablet Take 1 tablet (10 mg total) by mouth every evening. 30 tablet 0  . clopidogrel (PLAVIX) 75 MG tablet Take 75 mg by mouth daily.    . diphenoxylate-atropine (LOMOTIL) 2.5-0.025 MG tablet Take 1 tablet by mouth 4 (  four) times daily as needed for diarrhea or loose stools. Take it along with immodium 30 tablet 0  . HYDROcodone-acetaminophen (NORCO/VICODIN) 5-325 MG tablet Take 1 tablet by mouth every 8 (eight) hours as needed for moderate pain. 90 tablet 0  . isosorbide mononitrate (IMDUR) 30 MG 24 hr tablet Take 1 tablet (30 mg total) by mouth daily. 90 tablet 3  . KLOR-CON M20 20 MEQ tablet Take 1 tablet (20 mEq total) by mouth 2 (two) times daily. 60 tablet 1  . lisinopril (PRINIVIL,ZESTRIL) 10 MG tablet Take 10 mg by mouth daily.    . nitroGLYCERIN (NITROSTAT) 0.4 MG SL tablet Place 1 tablet (0.4 mg total) under the tongue every 5 (five) minutes as needed for chest pain. 25 tablet 6  . ondansetron (ZOFRAN) 8 MG tablet Take 1 tablet (8 mg total) by mouth 2  (two) times daily as needed (Nausea or vomiting). 30 tablet 1  . prochlorperazine (COMPAZINE) 10 MG tablet Take 1 tablet (10 mg total) by mouth every 6 (six) hours as needed (Nausea or vomiting). 30 tablet 1  . venlafaxine XR (EFFEXOR-XR) 37.5 MG 24 hr capsule Take 1 capsule (37.5 mg total) by mouth daily with breakfast. 30 capsule 3  . doxycycline (VIBRAMYCIN) 100 MG capsule Take 1 capsule (100 mg total) by mouth 2 (two) times daily. 20 capsule 0   No current facility-administered medications for this visit.     Review of Systems Review of Systems  Constitutional: Negative.   Respiratory: Negative.   Cardiovascular: Negative.   Gastrointestinal: Positive for abdominal pain. Negative for constipation, diarrhea, nausea and vomiting.    Blood pressure 138/84, pulse 80, resp. rate 12, height 5\' 6"  (1.676 m), weight 139 lb (63 kg).  Physical Exam Physical Exam  Constitutional: She is oriented to person, place, and time. She appears well-developed and well-nourished.  HENT:  Mouth/Throat: Oropharynx is clear and moist.  Eyes: Conjunctivae are normal. No scleral icterus.  Neck: Neck supple.  Cardiovascular: Normal rate, regular rhythm and normal heart sounds.   Pulmonary/Chest: Effort normal and breath sounds normal.  Abdominal: Soft. Bowel sounds are normal. No hernia.  Lymphadenopathy:    She has no cervical adenopathy.       Right: Inguinal (1.5 cm node that is firm, mobile, and mildly tender) adenopathy present.  Neurological: She is alert and oriented to person, place, and time.  Skin: Skin is warm and dry.  Psychiatric: Her behavior is normal.    Data Reviewed None  Assessment    CA rectum. Postoperative sigmoid colostomy and completed chemo/radiation    Plan    Recommended removal of rectum to patient.  States that she would prefer to wait until her Medicaid insurance is available. Begin Rx Doxycycline BID x 10 days for the painful node in right groin Follow-up in 2  weeks.     This information has been scribed by Karie Fetch RN, BSN,BC.   [name],[name] 01/19/2017, 8:33 AM

## 2017-01-23 ENCOUNTER — Ambulatory Visit: Payer: MEDICAID | Admitting: General Surgery

## 2017-01-31 ENCOUNTER — Encounter: Payer: Self-pay | Admitting: General Surgery

## 2017-01-31 ENCOUNTER — Ambulatory Visit (INDEPENDENT_AMBULATORY_CARE_PROVIDER_SITE_OTHER): Payer: Self-pay | Admitting: General Surgery

## 2017-01-31 VITALS — BP 146/94 | HR 64 | Resp 12 | Ht 66.0 in | Wt 138.0 lb

## 2017-01-31 DIAGNOSIS — D34 Benign neoplasm of thyroid gland: Secondary | ICD-10-CM

## 2017-01-31 DIAGNOSIS — C20 Malignant neoplasm of rectum: Secondary | ICD-10-CM

## 2017-01-31 NOTE — Patient Instructions (Signed)
The patient is aware to call back for any questions or concerns.  

## 2017-01-31 NOTE — Progress Notes (Signed)
Patient ID: Rachel [name], female   DOB: 1956-05-19, 61 y.o.   MRN: [id number]  Chief Complaint  Patient presents with  . Follow-up    HPI Rachel [name] is a 61 y.o. female.  Here today for follow up rectal cancer and to discuss surgical resection. She is still waiting on social security disability before she can get medicaid. Otherwise, no concerns or problems expressed.   She is here today with her daughter. I have reviewed the history of present illness with the patient.  HPI  Past Medical History:  Diagnosis Date  . Arthritis   . Body mass index (BMI) of 24.0-24.9 in adult   . Cardiovascular disease   . Current every day smoker    a. ~35 years - > has cut down to < 3/4 ppd  . Headache   . History of UTI   . Hypertension   . Midsternal chest pain    a. 05/2014 Stress Echo: Ex time: 8:09, ECG w/o acute changes, EF nl with possible mid and apical anterior HK-->cath recommended.  . Port-a-cath in place    RIGHT SIDE  . Rectal cancer ([place]) 2017  . Stroke ([place])    07/2016 tia no neurology followup now seen by pcp  . Syncope and collapse   . Thyroid nodule 2018   FINDINGS CONSISTENT WITH A HURTHLE CELL LESION AND/OR NEOPLASM ([facility]).    Past Surgical History:  Procedure Laterality Date  . ABDOMINAL HYSTERECTOMY    . BACK SURGERY     l 3,4,5  . BIOPSY THYROID Right 11/16/2016   FINDINGS CONSISTENT WITH A HURTHLE CELL LESION AND/OR NEOPLASM ([facility] [facility]).  [name] CHOLECYSTECTOMY    . COLONOSCOPY WITH PROPOFOL N/A 08/15/2016   Procedure: COLONOSCOPY WITH PROPOFOL;  Surgeon: Lollie Sails, MD;  Location[facility] ENDOSCOPY;  Service: Endoscopy;  Laterality: N/A;  . COLONOSCOPY WITH PROPOFOL N/A 11/08/2016   Procedure: COLONOSCOPY WITH PROPOFOL;  Surgeon: Christene Lye, MD;  Location: [facility] ENDOSCOPY;  Service: Endoscopy;  Laterality: N/A;  . COLOSTOMY N/A 11/23/2016   Procedure: SIGMOID COLOSTOMY;  Surgeon: Christene Lye, MD;  Location: [facility]  ORS;  Service: General;  Laterality: N/A;  . ERCP    . LAPAROSCOPY N/A 11/23/2016   Procedure: LAPAROSCOPY DIAGNOSTIC;  Surgeon: Christene Lye, MD;  Location: [facility] ORS;  Service: General;  Laterality: N/A;  . PARTIAL HYSTERECTOMY    . PORTACATH PLACEMENT N/A 08/18/2016   Procedure: INSERTION PORT-A-CATH;  Surgeon: Christene Lye, MD;  Location: [facility] ORS;  Service: General;  Laterality: N/A;    Family History  Problem Relation Age of Onset  . Breast cancer Mother   . Hyperlipidemia Father   . Heart disease Paternal Grandmother   . Stroke Maternal Grandfather     Social History Social History  Substance Use Topics  . Smoking status: Former Smoker    Packs/day: 0.25    Years: 35.00    Types: Cigarettes    Quit date: 08/11/2016  . Smokeless tobacco: Never Used  . Alcohol use No     Comment: occasional    Allergies  Allergen Reactions  . Prednisone Other (See Comments)    BLISTERS IN MOUTH AND ON TONGUE    Current Outpatient Prescriptions  Medication Sig Dispense Refill  . amLODipine (NORVASC) 10 MG tablet Take 1 tablet (10 mg total) by mouth every evening. 30 tablet 0  . clopidogrel (PLAVIX) 75 MG tablet Take 75 mg by mouth daily.    [name]  diphenoxylate-atropine (LOMOTIL) 2.5-0.025 MG tablet Take 1 tablet by mouth 4 (four) times daily as needed for diarrhea or loose stools. Take it along with immodium 30 tablet 0  . isosorbide mononitrate (IMDUR) 30 MG 24 hr tablet Take 1 tablet (30 mg total) by mouth daily. 90 tablet 3  . KLOR-CON M20 20 MEQ tablet Take 1 tablet (20 mEq total) by mouth 2 (two) times daily. 60 tablet 1  . lisinopril (PRINIVIL,ZESTRIL) 10 MG tablet Take 10 mg by mouth daily.    . nitroGLYCERIN (NITROSTAT) 0.4 MG SL tablet Place 1 tablet (0.4 mg total) under the tongue every 5 (five) minutes as needed for chest pain. 25 tablet 6  . ondansetron (ZOFRAN) 8 MG tablet Take 1 tablet (8 mg total) by mouth 2 (two) times daily as needed (Nausea or vomiting). 30  tablet 1  . prochlorperazine (COMPAZINE) 10 MG tablet Take 1 tablet (10 mg total) by mouth every 6 (six) hours as needed (Nausea or vomiting). 30 tablet 1  . venlafaxine XR (EFFEXOR-XR) 37.5 MG 24 hr capsule Take 1 capsule (37.5 mg total) by mouth daily with breakfast. 30 capsule 3   No current facility-administered medications for this visit.     Review of Systems Review of Systems  Constitutional: Negative.   Respiratory: Negative.   Gastrointestinal: Negative.     Blood pressure (!) 146/94, pulse 64, resp. rate 12, height 5\' 6"  (1.676 m), weight 138 lb (62.6 kg).  Physical Exam Physical Exam  Constitutional: She is oriented to person, place, and time. She appears well-developed and well-nourished.  Abdominal: Soft. Bowel sounds are normal.  Lymphadenopathy:       Right: Inguinal adenopathy present.  Right inguinal node is smaller and less tender.  Neurological: She is alert and oriented to person, place, and time.  Skin: Skin is warm and dry.  Psychiatric: Her behavior is normal.    Data Reviewed  Prior notes  Assessment    CA rectum. Postoperative sigmoid colostomy and completed chemo/radiation.  Thyroid nodule- to be addressed following abdominoperineal resection.      Plan    Patient will contact the office once she has her insurance straightened out.   Once she notifies the office, we can get abdominoperineal resection scheduled.      This information has been scribed by Karie Fetch RN, BSN,BC.   Emelee Rodocker G 02/01/2017, 1:49 PM

## 2017-02-12 ENCOUNTER — Inpatient Hospital Stay: Payer: Medicaid Other | Attending: Internal Medicine | Admitting: Internal Medicine

## 2017-02-12 ENCOUNTER — Inpatient Hospital Stay: Payer: Medicaid Other

## 2017-02-12 ENCOUNTER — Telehealth: Payer: Self-pay | Admitting: *Deleted

## 2017-02-12 VITALS — BP 111/75 | HR 81 | Temp 97.3°F | Wt 140.1 lb

## 2017-02-12 DIAGNOSIS — Z933 Colostomy status: Secondary | ICD-10-CM

## 2017-02-12 DIAGNOSIS — C20 Malignant neoplasm of rectum: Secondary | ICD-10-CM

## 2017-02-12 DIAGNOSIS — Z452 Encounter for adjustment and management of vascular access device: Secondary | ICD-10-CM | POA: Diagnosis not present

## 2017-02-12 DIAGNOSIS — Z79899 Other long term (current) drug therapy: Secondary | ICD-10-CM

## 2017-02-12 DIAGNOSIS — Z7902 Long term (current) use of antithrombotics/antiplatelets: Secondary | ICD-10-CM | POA: Insufficient documentation

## 2017-02-12 DIAGNOSIS — I10 Essential (primary) hypertension: Secondary | ICD-10-CM | POA: Diagnosis not present

## 2017-02-12 DIAGNOSIS — I2510 Atherosclerotic heart disease of native coronary artery without angina pectoris: Secondary | ICD-10-CM | POA: Diagnosis not present

## 2017-02-12 DIAGNOSIS — F17210 Nicotine dependence, cigarettes, uncomplicated: Secondary | ICD-10-CM | POA: Diagnosis not present

## 2017-02-12 DIAGNOSIS — E041 Nontoxic single thyroid nodule: Secondary | ICD-10-CM | POA: Diagnosis not present

## 2017-02-12 DIAGNOSIS — C775 Secondary and unspecified malignant neoplasm of intrapelvic lymph nodes: Secondary | ICD-10-CM | POA: Diagnosis not present

## 2017-02-12 DIAGNOSIS — C801 Malignant (primary) neoplasm, unspecified: Secondary | ICD-10-CM

## 2017-02-12 DIAGNOSIS — Z8673 Personal history of transient ischemic attack (TIA), and cerebral infarction without residual deficits: Secondary | ICD-10-CM | POA: Insufficient documentation

## 2017-02-12 MED ORDER — HEPARIN SOD (PORK) LOCK FLUSH 100 UNIT/ML IV SOLN
500.0000 [IU] | Freq: Once | INTRAVENOUS | Status: AC
  Administered 2017-02-12: 500 [IU] via INTRAVENOUS
  Filled 2017-02-12: qty 5

## 2017-02-12 MED ORDER — SODIUM CHLORIDE 0.9% FLUSH
10.0000 mL | Freq: Once | INTRAVENOUS | Status: AC
  Administered 2017-02-12: 10 mL via INTRAVENOUS
  Filled 2017-02-12: qty 10

## 2017-02-12 NOTE — Progress Notes (Signed)
Patient here today for follow up.  Patient states no new concerns today  

## 2017-02-12 NOTE — Progress Notes (Signed)
[facility] OFFICE PROGRESS NOTE  Patient Care Team: Lynnell Jude, MD as PCP - General (Family Medicine) Seeplaputhur Robinette Haines, MD (General Surgery)  Cancer Staging No matching staging information was found for the patient.   Oncology History   # SEP 2017- RECTAL CA mod diff adeno; STAGE III [no EUS; positive ~2m LN; Dr.sankar] CEA-7; Sep 21st START 5FU-RT [finished NOV 2017]  # Right thyroid mass/PET positive-   # smoker/ TIA [aug 2017- Plavix]   # Will order MMR/Mol testing on surgical resection  # Multiple family members-malignancy- await MMR on surgical resection specimen.      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  female patient with rectal cancer is Currently post chemoradiation Nov 2017- Is currently status post diverting colostomy for ongoing rectal pain.   The patient's pain in the rectal region improved. Patient has met with Dr. SJamal Collinsurgery-she is currently awaiting approval of disability/Medicaid- to proceed with surgery.  Patient interim met with social worker to help with the financial issues. She is awaiting Medicaid approval.  She denies any significant blood in her colostomy bag. Denies any diarrhea. Denies any blood in stools. No nausea or vomiting. No sores in the mouth. Appetite is fair.  Otherwise no chest pain or shortness of breath or cough.   REVIEW OF SYSTEMS:  A complete 10 point review of system is done which is negative except mentioned above/history of present illness.   PAST MEDICAL HISTORY :  Past Medical History:  Diagnosis Date  . Arthritis   . Body mass index (BMI) of 24.0-24.9 in adult   . Cardiovascular disease   . Current every day smoker    a. ~35 years - > has cut down to < 3/4 ppd  . Headache   . History of UTI   . Hypertension   . Midsternal  chest pain    a. 05/2014 Stress Echo: Ex time: 8:09, ECG w/o acute changes, EF nl with possible mid and apical anterior HK-->cath recommended.  . Port-a-cath in place    RIGHT SIDE  . Rectal cancer ([facility] 2017  . Stroke ([facility]    07/2016 tia no neurology followup now seen by pcp  . Syncope and collapse   . Thyroid nodule 2018   FINDINGS CONSISTENT WITH A HURTHLE CELL LESION AND/OR NEOPLASM (BETHESDA CATEGORY IV).    PAST SURGICAL HISTORY :   Past Surgical History:  Procedure Laterality Date  . ABDOMINAL HYSTERECTOMY    . BACK SURGERY     l 3,4,5  . BIOPSY THYROID Right 11/16/2016   FINDINGS CONSISTENT WITH A HURTHLE CELL LESION AND/OR NEOPLASM (BETHESDA CATEGORY IV).  .Marland KitchenCHOLECYSTECTOMY    . COLONOSCOPY WITH PROPOFOL N/A 08/15/2016   Procedure: COLONOSCOPY WITH PROPOFOL;  Surgeon: MLollie Sails MD;  [facility];  Service: Endoscopy;  Laterality: N/A;  . COLONOSCOPY WITH PROPOFOL N/A 11/08/2016   Procedure: COLONOSCOPY WITH PROPOFOL;  Surgeon: SChristene Lye MD;  Location: [facility] ENDOSCOPY;  Service: Endoscopy;  Laterality: N/A;  . COLOSTOMY N/A 11/23/2016   Procedure: SIGMOID COLOSTOMY;  Surgeon: SChristene Lye MD;  Location: [facility] ORS;  Service: General;  Laterality: N/A;  . ERCP    . LAPAROSCOPY N/A 11/23/2016   Procedure: LAPAROSCOPY DIAGNOSTIC;  Surgeon: SChristene Lye MD;  Location: [facility] ORS;  Service: General;  Laterality: N/A;  . PARTIAL HYSTERECTOMY    . PORTACATH PLACEMENT N/A 08/18/2016   Procedure: INSERTION PORT-A-CATH;  Surgeon: Christene Lye, MD;  Location: [facility] ORS;  Service: General;  Laterality: N/A;    FAMILY HISTORY :  Mom- gall bladder ca; breast cancer- 31s; mat grand ma- stomach cancer- 54s; sblings- 2 no cancers; 2 daughters- one daughter? Cervical  Family History  Problem Relation Age of Onset  . Breast cancer Mother   . Hyperlipidemia Father   . Heart disease Paternal Grandmother   . Stroke Maternal Grandfather      SOCIAL HISTORY:   Social History  Substance Use Topics  . Smoking status: Former Smoker    Packs/day: 0.25    Years: 35.00    Types: Cigarettes    Quit date: 08/11/2016  . Smokeless tobacco: Never Used  . Alcohol use No     Comment: occasional    ALLERGIES:  is allergic to prednisone.  MEDICATIONS:  Current Outpatient Prescriptions  Medication Sig Dispense Refill  . amLODipine (NORVASC) 10 MG tablet Take 1 tablet (10 mg total) by mouth every evening. 30 tablet 0  . clopidogrel (PLAVIX) 75 MG tablet Take 75 mg by mouth daily.    . diphenoxylate-atropine (LOMOTIL) 2.5-0.025 MG tablet Take 1 tablet by mouth 4 (four) times daily as needed for diarrhea or loose stools. Take it along with immodium 30 tablet 0  . isosorbide mononitrate (IMDUR) 30 MG 24 hr tablet Take 1 tablet (30 mg total) by mouth daily. 90 tablet 3  . KLOR-CON M20 20 MEQ tablet Take 1 tablet (20 mEq total) by mouth 2 (two) times daily. 60 tablet 1  . lisinopril (PRINIVIL,ZESTRIL) 10 MG tablet Take 10 mg by mouth daily.    . nitroGLYCERIN (NITROSTAT) 0.4 MG SL tablet Place 1 tablet (0.4 mg total) under the tongue every 5 (five) minutes as needed for chest pain. 25 tablet 6  . ondansetron (ZOFRAN) 8 MG tablet Take 1 tablet (8 mg total) by mouth 2 (two) times daily as needed (Nausea or vomiting). 30 tablet 1  . prochlorperazine (COMPAZINE) 10 MG tablet Take 1 tablet (10 mg total) by mouth every 6 (six) hours as needed (Nausea or vomiting). 30 tablet 1  . traMADol (ULTRAM) 50 MG tablet Take 50 mg by mouth every 6 (six) hours as needed.    . venlafaxine XR (EFFEXOR-XR) 37.5 MG 24 hr capsule Take 1 capsule (37.5 mg total) by mouth daily with breakfast. 30 capsule 3   No current facility-administered medications for this visit.     PHYSICAL EXAMINATION: ECOG PERFORMANCE STATUS: 1 - Symptomatic but completely ambulatory  BP 111/75 (BP Location: Right Arm, Patient Position: Sitting)   Pulse 81   Temp 97.3 F (36.3 C)  (Tympanic)   Wt 140 lb 2 oz (63.6 kg)   BMI 22.62 kg/m   Filed Weights   02/12/17 1351  Weight: 140 lb 2 oz (63.6 kg)    GENERAL: Well-nourished well-developed; Alert, no distress and comfortable.   With family.  EYES: no pallor or icterus OROPHARYNX: no thrush or ulceration; good dentition  NECK: supple, no masses felt LYMPH:  no palpable lymphadenopathy in the cervical, axillary or inguinal regions LUNGS: clear to auscultation and  No wheeze or crackles HEART/CVS: regular rate & rhythm and no murmurs; No lower extremity edema ABDOMEN:abdomen soft, non-tender and normal bowel sounds Musculoskeletal:no cyanosis of digits and no clubbing  PSYCH: alert & oriented x 3 with fluent speech NEURO:  no focal motor/sensory deficits SKIN:  no rashes or significant lesions  LABORATORY DATA:  I have reviewed the data as listed    Component Value Date/Time   NA 139 01/12/2017 1405   NA 144 07/25/2014 0444   K 3.7 01/12/2017 1405   K 3.7 07/25/2014 0444   CL 103 01/12/2017 1405   CL 107 07/25/2014 0444   CO2 30 01/12/2017 1405   CO2 30 07/25/2014 0444   GLUCOSE 105 (H) 01/12/2017 1405   GLUCOSE 87 07/25/2014 0444   BUN 8 01/12/2017 1405   BUN 8 07/25/2014 0444   CREATININE 0.72 01/12/2017 1405   CREATININE 0.69 07/25/2014 0444   CALCIUM 9.3 01/12/2017 1405   CALCIUM 8.4 (L) 07/25/2014 0444   PROT 7.5 01/12/2017 1405   PROT 6.5 07/25/2014 0444   ALBUMIN 3.9 01/12/2017 1405   ALBUMIN 2.6 (L) 07/25/2014 0444   AST 19 01/12/2017 1405   AST 96 (H) 07/25/2014 0444   ALT 9 (L) 01/12/2017 1405   ALT 123 (H) 07/25/2014 0444   ALKPHOS 86 01/12/2017 1405   ALKPHOS 189 (H) 07/25/2014 0444   BILITOT 0.3 01/12/2017 1405   BILITOT 2.0 (H) 07/25/2014 0444   GFRNONAA >60 01/12/2017 1405   GFRNONAA >60 07/25/2014 0444   GFRAA >60 01/12/2017 1405   GFRAA >60 07/25/2014 0444    No results found for: SPEP, UPEP  Lab Results  Component Value Date   WBC 5.3 01/12/2017   NEUTROABS 3.9  01/12/2017   HGB 13.7 01/12/2017   HCT 40.9 01/12/2017   MCV 90.2 01/12/2017   PLT 183 01/12/2017      Chemistry      Component Value Date/Time   NA 139 01/12/2017 1405   NA 144 07/25/2014 0444   K 3.7 01/12/2017 1405   K 3.7 07/25/2014 0444   CL 103 01/12/2017 1405   CL 107 07/25/2014 0444   CO2 30 01/12/2017 1405   CO2 30 07/25/2014 0444   BUN 8 01/12/2017 1405   BUN 8 07/25/2014 0444   CREATININE 0.72 01/12/2017 1405   CREATININE 0.69 07/25/2014 0444      Component Value Date/Time   CALCIUM 9.3 01/12/2017 1405   CALCIUM 8.4 (L) 07/25/2014 0444   ALKPHOS 86 01/12/2017 1405   ALKPHOS 189 (H) 07/25/2014 0444   AST 19 01/12/2017 1405   AST 96 (H) 07/25/2014 0444   ALT 9 (L) 01/12/2017 1405   ALT 123 (H) 07/25/2014 0444   BILITOT 0.3 01/12/2017 1405   BILITOT 2.0 (H) 07/25/2014 0444     IMPRESSION: 1. Ill-defined circumferential wall thickening in the distal rectum with perirectal edema/inflammation. The soft tissue nodule contiguous with the right aspect of the rectum on the prior study, likely representing direct distention, has decreased in the interval. Small perirectal lymph nodes persist. 2. No evidence for metastatic disease elsewhere in the abdomen or pelvis   Electronically Signed   By: Misty Stanley M.D.   On: 11/15/2016 17:23  RADIOGRAPHIC STUDIES: I have personally reviewed the radiological images as listed and agreed with the findings in the report. No results found.   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 However biopsy negative for any active malignancy- only radiation changes/necrosis. Currently status post Diverting colostomy.  # Pain in rectal region-improved; on prn pain medications.   # ? Depression/ ?  Sec to financial issues/work situation.  On effexor. Letter given explaining the need for disability- given the upcoming surgery need for chemotherapy and also thyroid surgery.   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   # If for above insurance reasons patient surgery has to be delayed- I would recommend starting chemotherapy to avoid progression of disease while awaiting for definitive surgery.  #Above plan discussed with the patient and her daughter. Also spoke to [employer].   # follow up to be determined at this time- based on above issues.    No orders of the defined types were placed in this encounter.      Cammie Sickle, MD 02/12/2017 3:48 PM

## 2017-02-12 NOTE — Assessment & Plan Note (Addendum)
#   Rectal  Cancer- stage III- s/p neoadjuvant chemoradiation with 5-FU continuous infusion with radiation; Nov 29th- colonoscopy minimal response; not mobile. However biopsy negative for any active malignancy- only radiation changes/necrosis. Currently status post Diverting colostomy.  # Pain in rectal region-improved; on prn pain medications.   # ? Depression/ ? Sec to financial issues/work situation. On effexor. Letter given explaining the need for disability- given the upcoming surgery need for chemotherapy and also thyroid surgery.   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   # If for above insurance reasons patient surgery has to be delayed- I would recommend starting chemotherapy to avoid progression of disease while awaiting for definitive surgery.  #Above plan discussed with the patient and her daughter. Also spoke to [employer].   # follow up to be determined at this time- based on above issues.

## 2017-02-12 NOTE — Telephone Encounter (Signed)
Please return regarding information you asked for [phone]

## 2017-02-12 NOTE — Telephone Encounter (Signed)
Daughter called her mother's case worker. Case worker has now expedited the patient's [place] medicaid application. Dept will make an individual decision on my mom's medicaid by this Friday and no later than next Monday. Daughter will contact our office back and [name] back asap to let us know the outcome of the patient's insurance.

## 2017-02-13 ENCOUNTER — Other Ambulatory Visit: Payer: Self-pay | Admitting: Internal Medicine

## 2017-02-26 ENCOUNTER — Telehealth: Payer: Self-pay

## 2017-02-26 NOTE — Telephone Encounter (Signed)
Call to patient to see about scheduling her colon surgery. She is scheduled for surgery at [facility] on 03/16/17. She will pre admit by phone. She will be seen in office by Dr Jamal Collin on 03/08/17 at 11:45 am. She will bring her new Medicaid card to this visit.

## 2017-02-28 ENCOUNTER — Ambulatory Visit: Payer: BLUE CROSS/BLUE SHIELD | Admitting: Radiation Oncology

## 2017-03-05 ENCOUNTER — Ambulatory Visit
Admission: RE | Admit: 2017-03-05 | Discharge: 2017-03-05 | Disposition: A | Discharge status: Home / Self Care | Payer: Disability Insurance | Source: Ambulatory Visit | Attending: Physical Medicine and Rehabilitation | Admitting: Physical Medicine and Rehabilitation

## 2017-03-05 ENCOUNTER — Other Ambulatory Visit: Payer: Self-pay | Admitting: Physical Medicine and Rehabilitation

## 2017-03-05 DIAGNOSIS — M419 Scoliosis, unspecified: Secondary | ICD-10-CM | POA: Insufficient documentation

## 2017-03-05 DIAGNOSIS — C19 Malignant neoplasm of rectosigmoid junction: Secondary | ICD-10-CM | POA: Insufficient documentation

## 2017-03-05 DIAGNOSIS — M488X7 Other specified spondylopathies, lumbosacral region: Secondary | ICD-10-CM | POA: Diagnosis not present

## 2017-03-05 DIAGNOSIS — M5136 Other intervertebral disc degeneration, lumbar region: Secondary | ICD-10-CM | POA: Diagnosis not present

## 2017-03-08 ENCOUNTER — Encounter: Payer: Self-pay | Admitting: General Surgery

## 2017-03-08 ENCOUNTER — Encounter
Admission: RE | Admit: 2017-03-08 | Discharge: 2017-03-08 | Disposition: A | Discharge status: Home / Self Care | Payer: Disability Insurance | Source: Ambulatory Visit | Attending: General Surgery | Admitting: General Surgery

## 2017-03-08 ENCOUNTER — Ambulatory Visit (INDEPENDENT_AMBULATORY_CARE_PROVIDER_SITE_OTHER): Payer: Medicaid Other | Admitting: General Surgery

## 2017-03-08 VITALS — BP 196/60 | HR 88 | Resp 14 | Ht 70.0 in | Wt 140.0 lb

## 2017-03-08 DIAGNOSIS — C20 Malignant neoplasm of rectum: Secondary | ICD-10-CM | POA: Diagnosis not present

## 2017-03-08 NOTE — Patient Instructions (Signed)
  Your procedure is scheduled on: 03-16-17 (Friday) Report to Same Day Surgery 2nd floor medical mall [facility] Entrance-take elevator on left to 2nd floor.  Check in with surgery information desk.) To find out your arrival time please call [phone] between 1PM - 3PM on 03-15-17 (Thursday)  Remember: Instructions that are not followed completely may result in serious medical risk, up to and including death, or upon the discretion of your surgeon and anesthesiologist your surgery may need to be rescheduled.    _x___ 1. Do not eat food or drink liquids after midnight. No gum chewing or hard candies.     __x__ 2. No Alcohol for 24 hours before or after surgery.   __x__3. No Smoking for 24 prior to surgery.   ____  4. Bring all medications with you on the day of surgery if instructed.    __x__ 5. Notify your doctor if there is any change in your medical condition     (cold, fever, infections).     Do not wear jewelry, make-up, hairpins, clips or nail polish.  Do not wear lotions, powders, or perfumes. You may wear deodorant.  Do not shave 48 hours prior to surgery. Men may shave face and neck.  Do not bring valuables to the hospital.    [facility] is not responsible for any belongings or valuables.               Contacts, dentures or bridgework may not be worn into surgery.  Leave your suitcase in the car. After surgery it may be brought to your room.  For patients admitted to the hospital, discharge time is determined by your treatment team.   Patients discharged the day of surgery will not be allowed to drive home.  You will need someone to drive you home and stay with you the night of your procedure.    Please read over the following fact sheets that you were given:   [facility] Preparing for Surgery and or MRSA Information   _x___ Take anti-hypertensive (unless it includes a diuretic), cardiac, seizure, asthma,     anti-reflux and psychiatric medicines. These include:  1.  IMDUR (ISOSORBIDE)  2. LISINOPRIL  3. NORVASC (AMLODIPINE)  4. EFFEXOR (VENLAFAXINE)  5.  6.  ____Fleets enema or Magnesium Citrate as directed.   _x___ Use CHG Soap or sage wipes as directed on instruction sheet   ____ Use inhalers on the day of surgery and bring to hospital day of surgery  ____ Stop Metformin and Janumet 2 days prior to surgery.    ____ Take 1/2 of usual insulin dose the night before surgery and none on the morning     surgery.   _x___ Follow recommendations from Cardiologist, Pulmonologist or PCP regarding stopping Aspirin, Coumadin, Pllavix ,Eliquis, Effient, or Pradaxa, and Pletal-STOP PLAVIX 5 DAYS PRIOR TO SURGERY PER DR Jamal Collin AND BEGIN 81 MG ASPIRIN-DO NOT TAKE ASPIRIN DAY OF SURGERY  X____Stop Anti-inflammatories such as Advil, Aleve, Ibuprofen, Motrin, Naproxen, Naprosyn, Goodies powders or aspirin products. OK to take Tylenol/TRAMADOL IF NEEDED   ____ Stop supplements until after surgery.     ____ Bring C-Pap to the hospital.

## 2017-03-08 NOTE — Patient Instructions (Addendum)
Patient is scheduled for surgery on 03/16/2017. Please stop Plavix 5 days prior to surgery and begin an 81 mg aspirin once Plavix is stopped.

## 2017-03-08 NOTE — Progress Notes (Signed)
Patient ID: Rachel [name], female   DOB: Aug 29, 1956, 61 y.o.   MRN: [id number]  Chief Complaint  Patient presents with  . Pre-op Exam    HPI Rachel [name] is a 61 y.o. female here today for her pre op  abdominal perineal reaction of rectum scheduled for 03/16/2017. Patient states she has had dirrhea for the last two days.  No other symptoms.  HPI  Past Medical History:  Diagnosis Date  . Arthritis   . Body mass index (BMI) of 24.0-24.9 in adult   . Cardiovascular disease   . Current every day smoker    a. ~35 years - > has cut down to < 3/4 ppd  . Headache   . History of UTI   . Hypertension   . Midsternal chest pain    a. 05/2014 Stress Echo: Ex time: 8:09, ECG w/o acute changes, EF nl with possible mid and apical anterior HK-->cath recommended.  . Port-a-cath in place    RIGHT SIDE  . Rectal cancer ([place]) 2017  . Stroke ([place])    07/2016 tia no neurology followup now seen by pcp  . Syncope and collapse   . Thyroid nodule 2018   FINDINGS CONSISTENT WITH A HURTHLE CELL LESION AND/OR NEOPLASM (BETHESDA CATEGORY IV).    Past Surgical History:  Procedure Laterality Date  . ABDOMINAL HYSTERECTOMY    . BACK SURGERY     l 3,4,5  . BIOPSY THYROID Right 11/16/2016   FINDINGS CONSISTENT WITH A HURTHLE CELL LESION AND/OR NEOPLASM (BETHESDA CATEGORY IV).  [name] CHOLECYSTECTOMY    . COLONOSCOPY WITH PROPOFOL N/A 08/15/2016   Procedure: COLONOSCOPY WITH PROPOFOL;  Surgeon: Lollie Sails, MD;  Location: [facility] ENDOSCOPY;  Service: Endoscopy;  Laterality: N/A;  . COLONOSCOPY WITH PROPOFOL N/A 11/08/2016   Procedure: COLONOSCOPY WITH PROPOFOL;  Surgeon: Christene Lye, MD;  Location: [facility] ENDOSCOPY;  Service: Endoscopy;  Laterality: N/A;  . COLOSTOMY N/A 11/23/2016   Procedure: SIGMOID COLOSTOMY;  Surgeon: Christene Lye, MD;  Location: [facility] ORS;  Service: General;  Laterality: N/A;  . ERCP    . LAPAROSCOPY N/A 11/23/2016   Procedure: LAPAROSCOPY DIAGNOSTIC;  Surgeon:  Christene Lye, MD;  Location: [facility] ORS;  Service: General;  Laterality: N/A;  . PARTIAL HYSTERECTOMY    . PORTACATH PLACEMENT N/A 08/18/2016   Procedure: INSERTION PORT-A-CATH;  Surgeon: Christene Lye, MD;  Location: [facility] ORS;  Service: General;  Laterality: N/A;    Family History  Problem Relation Age of Onset  . Breast cancer Mother   . Hyperlipidemia Father   . Heart disease Paternal Grandmother   . Stroke Maternal Grandfather     Social History Social History  Substance Use Topics  . Smoking status: Former Smoker    Packs/day: 0.25    Years: 35.00    Types: Cigarettes    Quit date: 08/11/2016  . Smokeless tobacco: Never Used  . Alcohol use No     Comment: occasional    Allergies  Allergen Reactions  . Prednisone Other (See Comments)    BLISTERS IN MOUTH AND ON TONGUE    Current Outpatient Prescriptions  Medication Sig Dispense Refill  . amLODipine (NORVASC) 10 MG tablet Take 1 tablet (10 mg total) by mouth every evening. (Patient taking differently: Take 10 mg by mouth daily. ) 30 tablet 0  . clopidogrel (PLAVIX) 75 MG tablet Take 75 mg by mouth every evening.     . diphenoxylate-atropine (LOMOTIL) 2.5-0.025 MG tablet Take 1 tablet by  mouth 4 (four) times daily as needed for diarrhea or loose stools. Take it along with immodium 30 tablet 0  . isosorbide mononitrate (IMDUR) 30 MG 24 hr tablet Take 1 tablet (30 mg total) by mouth daily. 90 tablet 3  . lisinopril (PRINIVIL,ZESTRIL) 10 MG tablet Take 10 mg by mouth daily.    . nitroGLYCERIN (NITROSTAT) 0.4 MG SL tablet Place 1 tablet (0.4 mg total) under the tongue every 5 (five) minutes as needed for chest pain. 25 tablet 6  . ondansetron (ZOFRAN) 8 MG tablet Take 1 tablet (8 mg total) by mouth 2 (two) times daily as needed (Nausea or vomiting). 30 tablet 1  . potassium chloride SA (K-DUR,KLOR-CON) 20 MEQ tablet TAKE ONE TABLET BY MOUTH TWICE DAILY 60 tablet 2  . prochlorperazine (COMPAZINE) 10 MG tablet Take 1  tablet (10 mg total) by mouth every 6 (six) hours as needed (Nausea or vomiting). 30 tablet 1  . traMADol (ULTRAM) 50 MG tablet Take 50 mg by mouth every 6 (six) hours as needed (for pain.).     [name] venlafaxine XR (EFFEXOR-XR) 37.5 MG 24 hr capsule Take 1 capsule (37.5 mg total) by mouth daily with breakfast. 30 capsule 3   No current facility-administered medications for this visit.     Review of Systems Review of Systems  Blood pressure (!) 196/60, pulse 88, resp. rate 14, height 5\' 10"  (1.778 m), weight 140 lb (63.5 kg).  Physical Exam Physical Exam  Constitutional: She is oriented to person, place, and time. She appears well-developed and well-nourished.  Eyes: Conjunctivae are normal. No scleral icterus.  Neck: Neck supple.  Cardiovascular: Normal rate, regular rhythm and normal heart sounds.   Pulmonary/Chest: Effort normal and breath sounds normal.  Abdominal: Soft. Bowel sounds are normal. There is no hepatomegaly. There is no tenderness. No hernia.    Lymphadenopathy:    She has no cervical adenopathy.  Neurological: She is alert and oriented to person, place, and time.  Skin: Skin is warm and dry.    Data Reviewed Prior notes reviewed  Assessment    CA rectum.Post chemo/radiation, s/p sigmoid colostomy.     Plan     Patient's surgery has been scheduled for 03-16-17. This patient will stop Plavix 5 days prior and begin an 81 mg aspirin once Plavix is stopped. AP resection , procedure, risks and benefits explained.    This information has been scribed by [name] CMA.    [name],[name] 03/08/2017, 12:11 PM

## 2017-03-09 LAB — COMPREHENSIVE METABOLIC PANEL
A/G RATIO: 1.6 (ref 1.2–2.2)
ALBUMIN: 4.2 g/dL (ref 3.6–4.8)
ALT: 12 IU/L (ref 0–32)
AST: 15 IU/L (ref 0–40)
Alkaline Phosphatase: 98 IU/L (ref 39–117)
BILIRUBIN TOTAL: 0.2 mg/dL (ref 0.0–1.2)
BUN / CREAT RATIO: 16 (ref 12–28)
BUN: 13 mg/dL (ref 8–27)
CHLORIDE: 99 mmol/L (ref 96–106)
CO2: 26 mmol/L (ref 18–29)
Calcium: 9.5 mg/dL (ref 8.7–10.3)
Creatinine, Ser: 0.81 mg/dL (ref 0.57–1.00)
GFR calc non Af Amer: 79 mL/min/{1.73_m2} (ref >59)
GFR, EST AFRICAN AMERICAN: 91 mL/min/{1.73_m2} (ref >59)
Globulin, Total: 2.6 g/dL (ref 1.5–4.5)
Glucose: 101 mg/dL — ABNORMAL HIGH (ref 65–99)
POTASSIUM: 4.3 mmol/L (ref 3.5–5.2)
Sodium: 140 mmol/L (ref 134–144)
TOTAL PROTEIN: 6.8 g/dL (ref 6.0–8.5)

## 2017-03-09 LAB — CBC WITH DIFFERENTIAL/PLATELET
BASOS: 0 %
Basophils Absolute: 0 10*3/uL (ref 0.0–0.2)
EOS (ABSOLUTE): 0 10*3/uL (ref 0.0–0.4)
Eos: 1 %
HEMOGLOBIN: 14 g/dL (ref 11.1–15.9)
Hematocrit: 42.1 % (ref 34.0–46.6)
IMMATURE GRANS (ABS): 0 10*3/uL (ref 0.0–0.1)
Immature Granulocytes: 0 %
LYMPHS: 18 %
Lymphocytes Absolute: 1.3 10*3/uL (ref 0.7–3.1)
MCH: 29.4 pg (ref 26.6–33.0)
MCHC: 33.3 g/dL (ref 31.5–35.7)
MCV: 88 fL (ref 79–97)
Monocytes Absolute: 0.5 10*3/uL (ref 0.1–0.9)
Monocytes: 7 %
NEUTROS ABS: 5.3 10*3/uL (ref 1.4–7.0)
Neutrophils: 74 %
Platelets: 217 10*3/uL (ref 150–379)
RBC: 4.76 x10E6/uL (ref 3.77–5.28)
RDW: 15.2 % (ref 12.3–15.4)
WBC: 7.2 10*3/uL (ref 3.4–10.8)

## 2017-03-12 ENCOUNTER — Encounter
Admission: RE | Admit: 2017-03-12 | Discharge: 2017-03-12 | Disposition: A | Discharge status: Home / Self Care | Payer: Medicaid Other | Source: Ambulatory Visit | Attending: General Surgery | Admitting: General Surgery

## 2017-03-12 DIAGNOSIS — I10 Essential (primary) hypertension: Secondary | ICD-10-CM | POA: Insufficient documentation

## 2017-03-12 DIAGNOSIS — Z01810 Encounter for preprocedural cardiovascular examination: Secondary | ICD-10-CM | POA: Diagnosis present

## 2017-03-15 ENCOUNTER — Encounter: Payer: Self-pay | Admitting: *Deleted

## 2017-03-16 ENCOUNTER — Inpatient Hospital Stay: Payer: Medicaid Other | Admitting: Anesthesiology

## 2017-03-16 ENCOUNTER — Encounter
Admission: RE | Disposition: A | Discharge status: Home / Self Care | Payer: Self-pay | Source: Ambulatory Visit | Attending: General Surgery

## 2017-03-16 ENCOUNTER — Encounter: Payer: Self-pay | Admitting: *Deleted

## 2017-03-16 ENCOUNTER — Inpatient Hospital Stay
Admission: RE | Admit: 2017-03-16 | Discharge: 2017-03-19 | DRG: 331 | Disposition: A | Discharge status: Home / Self Care | Payer: Medicaid Other | Source: Ambulatory Visit | Attending: General Surgery | Admitting: General Surgery

## 2017-03-16 DIAGNOSIS — I10 Essential (primary) hypertension: Secondary | ICD-10-CM | POA: Diagnosis present

## 2017-03-16 DIAGNOSIS — Z90710 Acquired absence of both cervix and uterus: Secondary | ICD-10-CM | POA: Diagnosis not present

## 2017-03-16 DIAGNOSIS — Z79891 Long term (current) use of opiate analgesic: Secondary | ICD-10-CM

## 2017-03-16 DIAGNOSIS — Z8673 Personal history of transient ischemic attack (TIA), and cerebral infarction without residual deficits: Secondary | ICD-10-CM

## 2017-03-16 DIAGNOSIS — M1990 Unspecified osteoarthritis, unspecified site: Secondary | ICD-10-CM | POA: Diagnosis present

## 2017-03-16 DIAGNOSIS — Z79899 Other long term (current) drug therapy: Secondary | ICD-10-CM

## 2017-03-16 DIAGNOSIS — C20 Malignant neoplasm of rectum: Principal | ICD-10-CM

## 2017-03-16 DIAGNOSIS — Z933 Colostomy status: Secondary | ICD-10-CM

## 2017-03-16 DIAGNOSIS — Z7902 Long term (current) use of antithrombotics/antiplatelets: Secondary | ICD-10-CM

## 2017-03-16 DIAGNOSIS — Z972 Presence of dental prosthetic device (complete) (partial): Secondary | ICD-10-CM

## 2017-03-16 DIAGNOSIS — Z8249 Family history of ischemic heart disease and other diseases of the circulatory system: Secondary | ICD-10-CM | POA: Diagnosis not present

## 2017-03-16 DIAGNOSIS — Z803 Family history of malignant neoplasm of breast: Secondary | ICD-10-CM | POA: Diagnosis not present

## 2017-03-16 DIAGNOSIS — Z888 Allergy status to other drugs, medicaments and biological substances status: Secondary | ICD-10-CM

## 2017-03-16 DIAGNOSIS — E041 Nontoxic single thyroid nodule: Secondary | ICD-10-CM | POA: Diagnosis present

## 2017-03-16 DIAGNOSIS — Z87440 Personal history of urinary (tract) infections: Secondary | ICD-10-CM | POA: Diagnosis not present

## 2017-03-16 DIAGNOSIS — Z9221 Personal history of antineoplastic chemotherapy: Secondary | ICD-10-CM | POA: Diagnosis not present

## 2017-03-16 DIAGNOSIS — Z823 Family history of stroke: Secondary | ICD-10-CM

## 2017-03-16 DIAGNOSIS — Z9049 Acquired absence of other specified parts of digestive tract: Secondary | ICD-10-CM

## 2017-03-16 DIAGNOSIS — Z923 Personal history of irradiation: Secondary | ICD-10-CM

## 2017-03-16 DIAGNOSIS — Z87891 Personal history of nicotine dependence: Secondary | ICD-10-CM

## 2017-03-16 HISTORY — PX: APPENDECTOMY: SHX54

## 2017-03-16 HISTORY — PX: ABDOMINAL PERINEAL BOWEL RESECTION: SHX1111

## 2017-03-16 LAB — CBC
HEMATOCRIT: 36.9 % (ref 35.0–47.0)
Hemoglobin: 12.3 g/dL (ref 12.0–16.0)
MCH: 29.3 pg (ref 26.0–34.0)
MCHC: 33.3 g/dL (ref 32.0–36.0)
MCV: 88.1 fL (ref 80.0–100.0)
PLATELETS: 186 10*3/uL (ref 150–440)
RBC: 4.19 MIL/uL (ref 3.80–5.20)
RDW: 15.1 % — AB (ref 11.5–14.5)
WBC: 16 10*3/uL — AB (ref 3.6–11.0)

## 2017-03-16 LAB — CREATININE, SERUM
CREATININE: 0.69 mg/dL (ref 0.44–1.00)
GFR calc Af Amer: >60 mL/min (ref >60)
GFR calc non Af Amer: >60 mL/min (ref >60)

## 2017-03-16 LAB — ABO/RH: ABO/RH(D): O POS

## 2017-03-16 SURGERY — PROCTECTOMY, ABDOMINOPERINEAL
Anesthesia: General

## 2017-03-16 MED ORDER — HYDROMORPHONE HCL 1 MG/ML IJ SOLN
INTRAMUSCULAR | Status: DC | PRN
  Administered 2017-03-16: 1 mg via INTRAVENOUS
  Administered 2017-03-16 (×2): 0.5 mg via INTRAVENOUS

## 2017-03-16 MED ORDER — BUPIVACAINE HCL (PF) 0.5 % IJ SOLN
INTRAMUSCULAR | Status: AC
  Filled 2017-03-16: qty 30

## 2017-03-16 MED ORDER — HYDROMORPHONE HCL 1 MG/ML IJ SOLN
INTRAMUSCULAR | Status: AC
  Administered 2017-03-16: 0.5 mg via INTRAVENOUS
  Filled 2017-03-16: qty 1

## 2017-03-16 MED ORDER — SODIUM CHLORIDE 0.9 % IV SOLN
1.0000 g | INTRAVENOUS | Status: AC
  Administered 2017-03-16: 1 g via INTRAVENOUS
  Filled 2017-03-16: qty 1

## 2017-03-16 MED ORDER — MIDAZOLAM HCL 2 MG/2ML IJ SOLN
INTRAMUSCULAR | Status: AC
  Filled 2017-03-16: qty 2

## 2017-03-16 MED ORDER — ENOXAPARIN SODIUM 40 MG/0.4ML ~~LOC~~ SOLN
40.0000 mg | SUBCUTANEOUS | Status: DC
  Administered 2017-03-17: 40 mg via SUBCUTANEOUS
  Filled 2017-03-16: qty 0.4

## 2017-03-16 MED ORDER — ACETAMINOPHEN 10 MG/ML IV SOLN
1000.0000 mg | Freq: Four times a day (QID) | INTRAVENOUS | Status: AC
  Administered 2017-03-16 – 2017-03-17 (×4): 1000 mg via INTRAVENOUS
  Filled 2017-03-16 (×4): qty 100

## 2017-03-16 MED ORDER — ACETAMINOPHEN 650 MG RE SUPP
650.0000 mg | Freq: Four times a day (QID) | RECTAL | Status: DC | PRN
Start: 2017-03-16 — End: 2017-03-16

## 2017-03-16 MED ORDER — ALVIMOPAN 12 MG PO CAPS
12.0000 mg | ORAL_CAPSULE | Freq: Two times a day (BID) | ORAL | Status: DC
  Administered 2017-03-16 – 2017-03-19 (×6): 12 mg via ORAL
  Filled 2017-03-16 (×8): qty 1

## 2017-03-16 MED ORDER — LIDOCAINE HCL (PF) 2 % IJ SOLN
INTRAMUSCULAR | Status: AC
  Filled 2017-03-16: qty 2

## 2017-03-16 MED ORDER — EPHEDRINE SULFATE 50 MG/ML IJ SOLN
INTRAMUSCULAR | Status: AC
  Filled 2017-03-16: qty 1

## 2017-03-16 MED ORDER — ACETAMINOPHEN 10 MG/ML IV SOLN
INTRAVENOUS | Status: DC | PRN
  Administered 2017-03-16: 1000 mg via INTRAVENOUS

## 2017-03-16 MED ORDER — OXYCODONE HCL 5 MG PO TABS
5.0000 mg | ORAL_TABLET | ORAL | Status: DC | PRN
  Administered 2017-03-17 – 2017-03-19 (×7): 10 mg via ORAL
  Filled 2017-03-16 (×7): qty 2

## 2017-03-16 MED ORDER — FENTANYL CITRATE (PF) 100 MCG/2ML IJ SOLN
INTRAMUSCULAR | Status: AC
  Administered 2017-03-16: 25 ug via INTRAVENOUS
  Filled 2017-03-16: qty 2

## 2017-03-16 MED ORDER — FENTANYL CITRATE (PF) 100 MCG/2ML IJ SOLN
INTRAMUSCULAR | Status: AC
  Filled 2017-03-16: qty 2

## 2017-03-16 MED ORDER — PHENYLEPHRINE HCL 10 MG/ML IJ SOLN
INTRAMUSCULAR | Status: DC | PRN
  Administered 2017-03-16 (×3): 100 ug via INTRAVENOUS

## 2017-03-16 MED ORDER — VENLAFAXINE HCL ER 37.5 MG PO CP24
37.5000 mg | ORAL_CAPSULE | Freq: Every day | ORAL | Status: DC
  Administered 2017-03-17 – 2017-03-19 (×3): 37.5 mg via ORAL
  Filled 2017-03-16 (×3): qty 1

## 2017-03-16 MED ORDER — SUGAMMADEX SODIUM 200 MG/2ML IV SOLN
INTRAVENOUS | Status: AC
  Filled 2017-03-16: qty 2

## 2017-03-16 MED ORDER — EVICEL 5 ML EX KIT
PACK | CUTANEOUS | Status: DC | PRN
  Administered 2017-03-16: 5 mL

## 2017-03-16 MED ORDER — NITROGLYCERIN 0.4 MG SL SUBL: 0.4000 mg | SUBLINGUAL_TABLET | SUBLINGUAL | Status: DC | PRN

## 2017-03-16 MED ORDER — KETOROLAC TROMETHAMINE 30 MG/ML IJ SOLN
30.0000 mg | Freq: Three times a day (TID) | INTRAMUSCULAR | Status: DC
  Administered 2017-03-16 – 2017-03-18 (×6): 30 mg via INTRAVENOUS
  Filled 2017-03-16 (×7): qty 1

## 2017-03-16 MED ORDER — EVICEL 5 ML EX KIT
PACK | CUTANEOUS | Status: AC
  Filled 2017-03-16: qty 1

## 2017-03-16 MED ORDER — MIDAZOLAM HCL 2 MG/2ML IJ SOLN
INTRAMUSCULAR | Status: DC | PRN
  Administered 2017-03-16: 2 mg via INTRAVENOUS

## 2017-03-16 MED ORDER — FENTANYL CITRATE (PF) 100 MCG/2ML IJ SOLN
25.0000 ug | INTRAMUSCULAR | Status: AC | PRN
  Administered 2017-03-16 (×6): 25 ug via INTRAVENOUS

## 2017-03-16 MED ORDER — ONDANSETRON 4 MG PO TBDP: 4.0000 mg | ORAL_TABLET | Freq: Four times a day (QID) | ORAL | Status: DC | PRN

## 2017-03-16 MED ORDER — BUPIVACAINE LIPOSOME 1.3 % IJ SUSP
INTRAMUSCULAR | Status: DC | PRN
  Administered 2017-03-16: 50 mL

## 2017-03-16 MED ORDER — HYDROMORPHONE HCL 1 MG/ML IJ SOLN
0.5000 mg | INTRAMUSCULAR | Status: AC | PRN
  Administered 2017-03-16 (×4): 0.5 mg via INTRAVENOUS

## 2017-03-16 MED ORDER — DEXTROSE-NACL 5-0.45 % IV SOLN
INTRAVENOUS | Status: DC
  Administered 2017-03-16 – 2017-03-18 (×4): via INTRAVENOUS

## 2017-03-16 MED ORDER — ACETAMINOPHEN 325 MG PO TABS: 650.0000 mg | ORAL_TABLET | Freq: Four times a day (QID) | ORAL | Status: DC | PRN

## 2017-03-16 MED ORDER — SODIUM CHLORIDE 0.9 % IJ SOLN
INTRAMUSCULAR | Status: AC
  Filled 2017-03-16: qty 10

## 2017-03-16 MED ORDER — ONDANSETRON HCL 4 MG/2ML IJ SOLN
INTRAMUSCULAR | Status: AC
  Filled 2017-03-16: qty 2

## 2017-03-16 MED ORDER — BUPIVACAINE LIPOSOME 1.3 % IJ SUSP
INTRAMUSCULAR | Status: AC
  Filled 2017-03-16: qty 20

## 2017-03-16 MED ORDER — OXYCODONE HCL 5 MG PO TABS: 5.0000 mg | ORAL_TABLET | Freq: Once | ORAL | Status: DC | PRN

## 2017-03-16 MED ORDER — ZOLPIDEM TARTRATE 5 MG PO TABS: 5.0000 mg | ORAL_TABLET | Freq: Every evening | ORAL | Status: DC | PRN

## 2017-03-16 MED ORDER — ISOSORBIDE MONONITRATE ER 30 MG PO TB24
30.0000 mg | ORAL_TABLET | Freq: Every morning | ORAL | Status: DC
  Administered 2017-03-17 – 2017-03-19 (×2): 30 mg via ORAL
  Filled 2017-03-16 (×3): qty 1

## 2017-03-16 MED ORDER — MORPHINE SULFATE (PF) 2 MG/ML IV SOLN
2.0000 mg | INTRAVENOUS | Status: DC | PRN
  Administered 2017-03-16 – 2017-03-18 (×6): 2 mg via INTRAVENOUS
  Filled 2017-03-16 (×6): qty 1

## 2017-03-16 MED ORDER — OXYCODONE HCL 5 MG/5ML PO SOLN: 5.0000 mg | Freq: Once | ORAL | Status: DC | PRN

## 2017-03-16 MED ORDER — PROPOFOL 10 MG/ML IV BOLUS
INTRAVENOUS | Status: DC | PRN
  Administered 2017-03-16: 120 mg via INTRAVENOUS

## 2017-03-16 MED ORDER — ROCURONIUM BROMIDE 100 MG/10ML IV SOLN
INTRAVENOUS | Status: DC | PRN
  Administered 2017-03-16: 10 mg via INTRAVENOUS
  Administered 2017-03-16: 30 mg via INTRAVENOUS
  Administered 2017-03-16: 10 mg via INTRAVENOUS
  Administered 2017-03-16: 20 mg via INTRAVENOUS
  Administered 2017-03-16: 10 mg via INTRAVENOUS

## 2017-03-16 MED ORDER — GLYCOPYRROLATE 0.2 MG/ML IJ SOLN
INTRAMUSCULAR | Status: DC | PRN
  Administered 2017-03-16: 0.2 mg via INTRAVENOUS

## 2017-03-16 MED ORDER — ONDANSETRON HCL 4 MG/2ML IJ SOLN: 4.0000 mg | Freq: Four times a day (QID) | INTRAMUSCULAR | Status: DC | PRN

## 2017-03-16 MED ORDER — HYDROMORPHONE HCL 1 MG/ML IJ SOLN
INTRAMUSCULAR | Status: AC
  Filled 2017-03-16: qty 1

## 2017-03-16 MED ORDER — SUCCINYLCHOLINE CHLORIDE 20 MG/ML IJ SOLN
INTRAMUSCULAR | Status: AC
  Filled 2017-03-16: qty 1

## 2017-03-16 MED ORDER — ALVIMOPAN 12 MG PO CAPS
12.0000 mg | ORAL_CAPSULE | Freq: Once | ORAL | Status: AC
  Administered 2017-03-16: 12 mg via ORAL

## 2017-03-16 MED ORDER — LACTATED RINGERS IV SOLN
INTRAVENOUS | Status: DC | PRN
  Administered 2017-03-16: 07:00:00 via INTRAVENOUS

## 2017-03-16 MED ORDER — CHLORHEXIDINE GLUCONATE CLOTH 2 % EX PADS: 6.0000 | MEDICATED_PAD | Freq: Once | CUTANEOUS | Status: DC

## 2017-03-16 MED ORDER — FENTANYL CITRATE (PF) 100 MCG/2ML IJ SOLN
25.0000 ug | INTRAMUSCULAR | Status: AC | PRN
  Administered 2017-03-16 (×2): 25 ug via INTRAVENOUS

## 2017-03-16 MED ORDER — FAMOTIDINE 20 MG PO TABS
20.0000 mg | ORAL_TABLET | Freq: Once | ORAL | Status: AC
  Administered 2017-03-16: 20 mg via ORAL

## 2017-03-16 MED ORDER — LACTATED RINGERS IV SOLN
INTRAVENOUS | Status: DC
  Administered 2017-03-16: 07:00:00 via INTRAVENOUS

## 2017-03-16 MED ORDER — LIDOCAINE HCL (CARDIAC) 20 MG/ML IV SOLN
INTRAVENOUS | Status: DC | PRN
  Administered 2017-03-16: 25 mg via INTRAVENOUS

## 2017-03-16 MED ORDER — ONDANSETRON HCL 4 MG/2ML IJ SOLN
INTRAMUSCULAR | Status: DC | PRN
Start: 2017-03-16 — End: 2017-03-16
  Administered 2017-03-16: 4 mg via INTRAVENOUS

## 2017-03-16 MED ORDER — SUGAMMADEX SODIUM 200 MG/2ML IV SOLN
INTRAVENOUS | Status: DC | PRN
  Administered 2017-03-16: 130 mg via INTRAVENOUS

## 2017-03-16 MED ORDER — DEXAMETHASONE SODIUM PHOSPHATE 10 MG/ML IJ SOLN
INTRAMUSCULAR | Status: AC
  Filled 2017-03-16: qty 1

## 2017-03-16 MED ORDER — FENTANYL CITRATE (PF) 100 MCG/2ML IJ SOLN
INTRAMUSCULAR | Status: DC | PRN
  Administered 2017-03-16: 50 ug via INTRAVENOUS
  Administered 2017-03-16: 100 ug via INTRAVENOUS
  Administered 2017-03-16: 50 ug via INTRAVENOUS

## 2017-03-16 MED ORDER — PROPOFOL 10 MG/ML IV BOLUS
INTRAVENOUS | Status: AC
  Filled 2017-03-16: qty 20

## 2017-03-16 MED ORDER — PANTOPRAZOLE SODIUM 40 MG IV SOLR
40.0000 mg | Freq: Every day | INTRAVENOUS | Status: DC
Start: 2017-03-16 — End: 2017-03-19
  Administered 2017-03-16 – 2017-03-18 (×3): 40 mg via INTRAVENOUS
  Filled 2017-03-16 (×3): qty 40

## 2017-03-16 MED ORDER — AMLODIPINE BESYLATE 10 MG PO TABS
10.0000 mg | ORAL_TABLET | Freq: Every morning | ORAL | Status: DC
  Administered 2017-03-17: 10 mg via ORAL
  Filled 2017-03-16 (×2): qty 1

## 2017-03-16 SURGICAL SUPPLY — 52 items
BAG URINE DRAINAGE (UROLOGICAL SUPPLIES) ×2 IMPLANT
BRIEF STRETCH MATERNITY 2XLG (MISCELLANEOUS) ×4 IMPLANT
BULB RESERV EVAC DRAIN JP 100C (MISCELLANEOUS) ×4 IMPLANT
CANISTER SUCT 1200ML W/VALVE (MISCELLANEOUS) ×4 IMPLANT
CATH SILICONE 24FR 30CC 3WAY (CATHETERS) IMPLANT
CATH TRAY 16F METER LATEX (MISCELLANEOUS) ×4 IMPLANT
CHLORAPREP W/TINT 26ML (MISCELLANEOUS) ×4 IMPLANT
DRAIN CHANNEL JP 15F RND 16 (MISCELLANEOUS) ×2 IMPLANT
DRAIN CHANNEL JP 19F (MISCELLANEOUS) ×2 IMPLANT
DRAPE INCISE IOBAN 66X45 STRL (DRAPES) ×4 IMPLANT
DRAPE LEGGINS SURG 28X43 STRL (DRAPES) ×4 IMPLANT
DRAPE UNDER BUTTOCK W/FLU (DRAPES) ×4 IMPLANT
DRSG OPSITE POSTOP 4X10 (GAUZE/BANDAGES/DRESSINGS) ×2 IMPLANT
ELECT BLADE 6 FLAT ULTRCLN (ELECTRODE) ×4 IMPLANT
ELECT CAUTERY BLADE 6.4 (BLADE) ×8 IMPLANT
ELECT REM PT RETURN 9FT ADLT (ELECTROSURGICAL) ×4
ELECTRODE REM PT RTRN 9FT ADLT (ELECTROSURGICAL) ×2 IMPLANT
GLOVE BIO SURGEON STRL SZ7.5 (GLOVE) ×20 IMPLANT
GOWN STRL REUS W/ TWL LRG LVL3 (GOWN DISPOSABLE) ×10 IMPLANT
GOWN STRL REUS W/TWL LRG LVL3 (GOWN DISPOSABLE) ×32
HANDLE YANKAUER SUCT BULB TIP (MISCELLANEOUS) ×6 IMPLANT
HARMONIC SCALPEL FOCUS (MISCELLANEOUS) ×4 IMPLANT
KIT OSTOMY 2 PC DRNBL 2.25 STR (WOUND CARE) IMPLANT
KIT OSTOMY DRAINABLE 2.25 STR (WOUND CARE) ×4
KIT PINK PAD W/HEAD ARE REST (MISCELLANEOUS) ×4
KIT PINK PAD W/HEAD ARM REST (MISCELLANEOUS) ×2 IMPLANT
KIT RM TURNOVER CYSTO AR (KITS) ×4 IMPLANT
OINTMENT BETADINE 1.5GM (MISCELLANEOUS) ×4 IMPLANT
PACK BASIN MAJOR ARMC (MISCELLANEOUS) ×4 IMPLANT
PACK BASIN MINOR ARMC (MISCELLANEOUS) ×4 IMPLANT
PACK COLON CLEAN CLOSURE (MISCELLANEOUS) ×2 IMPLANT
PAD PREP 24X41 OB/GYN DISP (PERSONAL CARE ITEMS) ×4 IMPLANT
PENCIL ELECTRO HAND CTR (MISCELLANEOUS) ×4 IMPLANT
RELOAD PROXIMATE 75MM BLUE (ENDOMECHANICALS) IMPLANT
RELOAD STAPLE 75 3.8 BLU REG (ENDOMECHANICALS) IMPLANT
RETRACTOR WND ALEXIS-O 25 LRG (MISCELLANEOUS) IMPLANT
RETRACTOR WOUND ALXS 18CM MED (MISCELLANEOUS) IMPLANT
RTRCTR WOUND ALEXIS O 18CM MED (MISCELLANEOUS)
RTRCTR WOUND ALEXIS O 25CM LRG (MISCELLANEOUS)
SHEARS FOC LG CVD HARMONIC 17C (MISCELLANEOUS) IMPLANT
SPONGE LAP 18X18 5 PK (GAUZE/BANDAGES/DRESSINGS) ×4 IMPLANT
STAPLER PROXIMATE 75MM BLUE (STAPLE) IMPLANT
STAPLER SKIN PROX 35W (STAPLE) ×4 IMPLANT
SUT ETHILON 3-0 KS 30 BLK (SUTURE) ×2 IMPLANT
SUT PDS AB 0 CT1 27 (SUTURE) ×4 IMPLANT
SUT SILK 3 0 (SUTURE) ×4
SUT SILK 3 0 REEL (SUTURE) ×2 IMPLANT
SUT SILK 3-0 18XBRD TIE 12 (SUTURE) IMPLANT
SUT VIC AB 2-0 CT1 (SUTURE) ×8 IMPLANT
SUT VICRYL 3-0 CR8 SH (SUTURE) ×2 IMPLANT
SYR 30ML LL (SYRINGE) ×4 IMPLANT
SYRINGE IRR TOOMEY STRL 70CC (SYRINGE) ×2 IMPLANT

## 2017-03-16 NOTE — Transfer of Care (Signed)
Immediate Anesthesia Transfer of Care Note  Patient: Rachel [name]  Procedure(s) Performed: Procedure(s): ABDOMINAL PERINEAL RESECTION (N/A) APPENDECTOMY  Patient Location: PACU  Anesthesia Type:General  Level of Consciousness: awake, alert  and oriented  Airway & Oxygen Therapy: Patient Spontanous Breathing and Patient connected to nasal cannula oxygen  Post-op Assessment: Report given to RN and Post -op Vital signs reviewed and stable  Post vital signs: Reviewed and stable  Last Vitals:  Vitals:   03/16/17 0610 03/16/17 1020  BP: 111/90 130/87  Pulse: 74 75  Resp: 20 12  Temp: 36.3 C 36.4 C    Last Pain:  Vitals:   03/16/17 1020  TempSrc: Temporal  PainSc: 6       Patients Stated Pain Goal: 2 (70/76/15 1834)  Complications: No apparent anesthesia complications

## 2017-03-16 NOTE — H&P (View-Only) (Signed)
Patient ID: Rachel [name], female   DOB: [birth year], 61 y.o.   MRN: [id number]  Chief Complaint  Patient presents with  . Pre-op Exam    HPI Rachel [name] is a 61 y.o. female here today for her pre op  abdominal perineal reaction of rectum scheduled for 03/16/2017. Patient states she has had dirrhea for the last two days.  No other symptoms.  HPI  Past Medical History:  Diagnosis Date  . Arthritis   . Body mass index (BMI) of 24.0-24.9 in adult   . Cardiovascular disease   . Current every day smoker    a. ~35 years - > has cut down to < 3/4 ppd  . Headache   . History of UTI   . Hypertension   . Midsternal chest pain    a. 05/2014 Stress Echo: Ex time: 8:09, ECG w/o acute changes, EF nl with possible mid and apical anterior HK-->cath recommended.  . Port-a-cath in place    RIGHT SIDE  . Rectal cancer ([place]) 2017  . Stroke ([place])    07/2016 tia no neurology followup now seen by pcp  . Syncope and collapse   . Thyroid nodule 2018   FINDINGS CONSISTENT WITH A HURTHLE CELL LESION AND/OR NEOPLASM (BETHESDA CATEGORY IV).    Past Surgical History:  Procedure Laterality Date  . ABDOMINAL HYSTERECTOMY    . BACK SURGERY     l 3,4,5  . BIOPSY THYROID Right 11/16/2016   FINDINGS CONSISTENT WITH A HURTHLE CELL LESION AND/OR NEOPLASM (BETHESDA CATEGORY IV).  [name] CHOLECYSTECTOMY    . COLONOSCOPY WITH PROPOFOL N/A 08/15/2016   Procedure: COLONOSCOPY WITH PROPOFOL;  Surgeon: Lollie Sails, MD;  Location: [facility] ENDOSCOPY;  Service: Endoscopy;  Laterality: N/A;  . COLONOSCOPY WITH PROPOFOL N/A 11/08/2016   Procedure: COLONOSCOPY WITH PROPOFOL;  Surgeon: Christene Lye, MD;  Location: [facility] ENDOSCOPY;  Service: Endoscopy;  Laterality: N/A;  . COLOSTOMY N/A 11/23/2016   Procedure: SIGMOID COLOSTOMY;  Surgeon: Christene Lye, MD;  Location: [facility] ORS;  Service: General;  Laterality: N/A;  . ERCP    . LAPAROSCOPY N/A 11/23/2016   Procedure: LAPAROSCOPY DIAGNOSTIC;  Surgeon:  Christene Lye, MD;  Location: [facility] ORS;  Service: General;  Laterality: N/A;  . PARTIAL HYSTERECTOMY    . PORTACATH PLACEMENT N/A 08/18/2016   Procedure: INSERTION PORT-A-CATH;  Surgeon: Christene Lye, MD;  Location: [facility] ORS;  Service: General;  Laterality: N/A;    Family History  Problem Relation Age of Onset  . Breast cancer Mother   . Hyperlipidemia Father   . Heart disease Paternal Grandmother   . Stroke Maternal Grandfather     Social History Social History  Substance Use Topics  . Smoking status: Former Smoker    Packs/day: 0.25    Years: 35.00    Types: Cigarettes    Quit date: 08/11/2016  . Smokeless tobacco: Never Used  . Alcohol use No     Comment: occasional    Allergies  Allergen Reactions  . Prednisone Other (See Comments)    BLISTERS IN MOUTH AND ON TONGUE    Current Outpatient Prescriptions  Medication Sig Dispense Refill  . amLODipine (NORVASC) 10 MG tablet Take 1 tablet (10 mg total) by mouth every evening. (Patient taking differently: Take 10 mg by mouth daily. ) 30 tablet 0  . clopidogrel (PLAVIX) 75 MG tablet Take 75 mg by mouth every evening.     . diphenoxylate-atropine (LOMOTIL) 2.5-0.025 MG tablet Take 1 tablet by  mouth 4 (four) times daily as needed for diarrhea or loose stools. Take it along with immodium 30 tablet 0  . isosorbide mononitrate (IMDUR) 30 MG 24 hr tablet Take 1 tablet (30 mg total) by mouth daily. 90 tablet 3  . lisinopril (PRINIVIL,ZESTRIL) 10 MG tablet Take 10 mg by mouth daily.    . nitroGLYCERIN (NITROSTAT) 0.4 MG SL tablet Place 1 tablet (0.4 mg total) under the tongue every 5 (five) minutes as needed for chest pain. 25 tablet 6  . ondansetron (ZOFRAN) 8 MG tablet Take 1 tablet (8 mg total) by mouth 2 (two) times daily as needed (Nausea or vomiting). 30 tablet 1  . potassium chloride SA (K-DUR,KLOR-CON) 20 MEQ tablet TAKE ONE TABLET BY MOUTH TWICE DAILY 60 tablet 2  . prochlorperazine (COMPAZINE) 10 MG tablet Take 1  tablet (10 mg total) by mouth every 6 (six) hours as needed (Nausea or vomiting). 30 tablet 1  . traMADol (ULTRAM) 50 MG tablet Take 50 mg by mouth every 6 (six) hours as needed (for pain.).     [name] venlafaxine XR (EFFEXOR-XR) 37.5 MG 24 hr capsule Take 1 capsule (37.5 mg total) by mouth daily with breakfast. 30 capsule 3   No current facility-administered medications for this visit.     Review of Systems Review of Systems  Blood pressure (!) 196/60, pulse 88, resp. rate 14, height 5\' 10"  (1.778 m), weight 140 lb (63.5 kg).  Physical Exam Physical Exam  Constitutional: She is oriented to person, place, and time. She appears well-developed and well-nourished.  Eyes: Conjunctivae are normal. No scleral icterus.  Neck: Neck supple.  Cardiovascular: Normal rate, regular rhythm and normal heart sounds.   Pulmonary/Chest: Effort normal and breath sounds normal.  Abdominal: Soft. Bowel sounds are normal. There is no hepatomegaly. There is no tenderness. No hernia.    Lymphadenopathy:    She has no cervical adenopathy.  Neurological: She is alert and oriented to person, place, and time.  Skin: Skin is warm and dry.    Data Reviewed Prior notes reviewed  Assessment    CA rectum.Post chemo/radiation, s/p sigmoid colostomy.     Plan     Patient's surgery has been scheduled for 03-16-17. This patient will stop Plavix 5 days prior and begin an 81 mg aspirin once Plavix is stopped. AP resection , procedure, risks and benefits explained.    This information has been scribed by [name] CMA.    [name],[name] 03/08/2017, 12:11 PM

## 2017-03-16 NOTE — Interval H&P Note (Signed)
History and Physical Interval Note:  03/16/2017 6:59 AM  Rachel [name]  has presented today for surgery, with the diagnosis of CANCER RECTUM  The various methods of treatment have been discussed with the patient and family. After consideration of risks, benefits and other options for treatment, the patient has consented to  Procedure(s): ABDOMINAL PERINEAL RESECTION (N/A) as a surgical intervention .  The patient's history has been reviewed, patient examined, no change in status, stable for surgery.  I have reviewed the patient's chart and labs.  Questions were answered to the patient's satisfaction.     [name],[name]

## 2017-03-16 NOTE — Anesthesia Post-op Follow-up Note (Cosign Needed)
Anesthesia QCDR form completed.        

## 2017-03-16 NOTE — Op Note (Signed)
Preop diagnosis: Carcinoma of the rectum  Post op diagnosis: Same  Operation: Abdominoperineal resection of the rectosigmoid  Surgeon: Ellwood Sayers   M.D.                 Job Founds M.D. Assistant:     Anesthesia: Gen.  Complications: None  EBL: Approximately 200 mL  Drains: Blake drain   Description: This 61 year old female was diagnosed with rectal cancer with near obstruction and underwent preoperative chemoradiation. Following this because of significant sacral area pain patient had a colostomy performed the laparoscope. Since that procedure her sacral pain resolved fully. She was now brought in for a planned resection of the remaining rectosigmoid. Patient was put to sleep in a supine position with endotracheal tube. Foley catheter was inserted and the patient placed in the lithotomy position. The abdomen and perineum and perianal area were prepped and draped out with Betadine. The colostomy site was covered with a small sponge and covered with Tegaderm. Timeout was performed 2 team approach was used. Abdominal portion was done by myself in the peritoneal portion done by Dr. Richardson Landry. Lower vertical midline incision was then made in the abdomen and carried through the layers of abdominal wall. Bleeding was controlled cautery. After entering the abdominal cavity the small bowel was displaced superiorly with an elderly place by lap sponge and a malleable retractor. The rectal stump was then identified and this was freed from a small adhesion to the small bowel carefully. Following this the mesosigmoid and mesorectum were then taken down with the use of harmonic device towards the sacral hollow. The continuation of the inferior mesenteric artery was ligated twice with 2-0 silk before it was taken down. The peritoneum surrounding the lateral and anterior aspect of the lower most rectum was then scored with cautery. Continued dissection was then performed on the lateral and posterior aspect again  using blunt dissection and use of the harmonic device. Following this the anterior portion of this was dissected down as far as could be reached from the abdominal area. The perineal incision was made around the anal canal each was closed with a pursestring stitch of silk. This was then dissected in a superior fashion towards the abdominal portion again using the cautery and ligatures of 30 and 2-0 Vicryl. With continued dissection both from the abdominal and perineal approach the lateral and posterior aspect were completely freed. The colon was then brought out through the perineal area and the remaining anterior attachments were then taken down. Hemostasis was obtained with the cautery and suture ligatures of 2-0 Vicryl in the perineal tissue. The pelvic hollow was then irrigated out the large amount of saline. Hemostasis was noted to be intact. The sacral area and the lateral and anterior aspects were then coated with 5 mL of Eviseal. A Blake drain was position in the presacral space and brought out through a separate stab incision in the perineal area. Drain was fastened with a nylon stitch. The pelvic floor was then reapproximated with the running suture of 2-0 Vicryl. Sponges and retractors were then removed. The peritoneal layer was closed in the abdomen with a running suture of 2-0 Vicryl. Wound was irrigated and the fascia was closed with interrupted figure-of-eight stitches of  0 PDS. Subcutaneous tissue closed with 3-0 Vicryl and the skin with staples. The perineal wound was closed with the 2 layers of 2-0 Vicryl and the skin closed with the 3-0 Vicryl. The abdominal incision was covered with a honeycomb dressing and a  colostomy appliance was placed around the colostomy. The perineal wound was covered with ABDs and mesh panty. Patient tolerated the procedure well with no immediate problems encountered. She was subsequently extubated and returned to recovery room in stable condition.

## 2017-03-16 NOTE — Anesthesia Preprocedure Evaluation (Signed)
Anesthesia Evaluation  Patient identified by MRN, date of birth, ID band Patient awake    Reviewed: Allergy & Precautions, H&P , NPO status , Patient's Chart, lab work & pertinent test results, reviewed documented beta blocker date and time   History of Anesthesia Complications Negative for: history of anesthetic complications  Airway Mallampati: II  TM Distance: >3 FB Neck ROM: limited    Dental  (+) Upper Dentures, Lower Dentures, Poor Dentition, Missing   Pulmonary neg sleep apnea, neg COPD, Current Smoker, former smoker,    breath sounds clear to auscultation- rhonchi (-) wheezing      Cardiovascular Exercise Tolerance: Good hypertension, Pt. on medications and Pt. on home beta blockers (-) angina+ Peripheral Vascular Disease  (-) CAD and (-) Past MI  Rhythm:Regular Rate:Normal - Systolic murmurs and - Diastolic murmurs    Neuro/Psych  Headaches, CVA, No Residual Symptoms negative psych ROS   GI/Hepatic negative GI ROS, Neg liver ROS,   Endo/Other  negative endocrine ROSneg diabetes  Renal/GU negative Renal ROS     Musculoskeletal  (+) Arthritis ,   Abdominal (+) - obese,   Peds  Hematology negative hematology ROS (+)   Anesthesia Other Findings Past Medical History: No date: Body mass index (BMI) of 24.0-24.9 in adult No date: Cardiovascular disease No date: Current every day smoker     Comment: a. ~35 years - > has cut down to < 3/4 ppd No date: Headache No date: History of UTI No date: Hypertension No date: Midsternal chest pain     Comment: a. 05/2014 Stress Echo: Ex time: 8:09, ECG w/o               acute changes, EF nl with possible mid and               apical anterior HK-->cath recommended. No date: Stroke [facility]) No date: Syncope and collapse   Reproductive/Obstetrics                             Anesthesia Physical  Anesthesia Plan  ASA: III  Anesthesia Plan: General    Post-op Pain Management:    Induction: Intravenous  Airway Management Planned: Natural Airway  Additional Equipment:   Intra-op Plan:   Post-operative Plan:   Informed Consent: I have reviewed the patients History and Physical, chart, labs and discussed the procedure including the risks, benefits and alternatives for the proposed anesthesia with the patient or authorized representative who has indicated his/her understanding and acceptance.   Dental advisory given  Plan Discussed with: CRNA and Anesthesiologist  Anesthesia Plan Comments:         Anesthesia Quick Evaluation

## 2017-03-16 NOTE — Anesthesia Postprocedure Evaluation (Signed)
Anesthesia Post Note  Patient: Rachel [name]  Procedure(s) Performed: Procedure(s) (LRB): ABDOMINAL PERINEAL RESECTION (N/A) APPENDECTOMY  Patient location during evaluation: PACU Anesthesia Type: General Level of consciousness: awake and alert Pain management: pain level controlled Vital Signs Assessment: post-procedure vital signs reviewed and stable Respiratory status: spontaneous breathing, nonlabored ventilation, respiratory function stable and patient connected to nasal cannula oxygen Cardiovascular status: blood pressure returned to baseline and stable Postop Assessment: no signs of nausea or vomiting Anesthetic complications: no     Last Vitals:  Vitals:   03/16/17 1155 03/16/17 1201  BP:  106/72  Pulse: 71 77  Resp: 10 20  Temp:  36.5 C    Last Pain:  Vitals:   03/16/17 1201  TempSrc:   PainSc: 4                  Precious Haws Karishma Unrein

## 2017-03-16 NOTE — Anesthesia Procedure Notes (Signed)
Procedure Name: Intubation Date/Time: 03/16/2017 7:35 AM Performed by: Darlyne Russian Pre-anesthesia Checklist: Patient identified, Emergency Drugs available, Suction available, Patient being monitored and Timeout performed Patient Re-evaluated:Patient Re-evaluated prior to inductionOxygen Delivery Method: Circle system utilized Preoxygenation: Pre-oxygenation with 100% oxygen Intubation Type: IV induction Ventilation: Mask ventilation without difficulty Laryngoscope Size: Mac and 3 Grade View: Grade I Tube type: Oral Tube size: 7.0 mm Number of attempts: 1 Airway Equipment and Method: Stylet Placement Confirmation: ETT inserted through vocal cords under direct vision,  positive ETCO2 and breath sounds checked- equal and bilateral Secured at: 21 cm Tube secured with: Tape Dental Injury: Teeth and Oropharynx as per pre-operative assessment

## 2017-03-17 LAB — BASIC METABOLIC PANEL
Anion gap: 4 — ABNORMAL LOW (ref 5–15)
BUN: 13 mg/dL (ref 6–20)
CO2: 30 mmol/L (ref 22–32)
CREATININE: 0.83 mg/dL (ref 0.44–1.00)
Calcium: 8.3 mg/dL — ABNORMAL LOW (ref 8.9–10.3)
Chloride: 105 mmol/L (ref 101–111)
Glucose, Bld: 125 mg/dL — ABNORMAL HIGH (ref 65–99)
POTASSIUM: 3.5 mmol/L (ref 3.5–5.1)
Sodium: 139 mmol/L (ref 135–145)

## 2017-03-17 LAB — CBC
HCT: 32.4 % — ABNORMAL LOW (ref 35.0–47.0)
Hemoglobin: 11 g/dL — ABNORMAL LOW (ref 12.0–16.0)
MCH: 30 pg (ref 26.0–34.0)
MCHC: 34.1 g/dL (ref 32.0–36.0)
MCV: 88.1 fL (ref 80.0–100.0)
Platelets: 160 10*3/uL (ref 150–440)
RBC: 3.67 MIL/uL — AB (ref 3.80–5.20)
RDW: 15.4 % — AB (ref 11.5–14.5)
WBC: 8.7 10*3/uL (ref 3.6–11.0)

## 2017-03-17 NOTE — Progress Notes (Signed)
AVSS.  Better pain control with addition of IV tylenol and toradol. No nausea. Inspirex at 750. Lungs: Clear. ABD: Good BS, soft. No stoma output.  JP: 270 cc.  Plan: Maintain foley today. Ambulate.

## 2017-03-17 NOTE — Consult Note (Signed)
[place] Nurse ostomy consult note Stoma type/location: LLQ colostomy. Patient is known to our service from previous admission in December.  Is independent in pouching; currently having issues obtaining supplies as she has Medicaid as a third party payer. Stomal assessment/size: Oval, pale pink ostomy with os in center. Vertical measurement is 7/8 inches, side to side measurement is 1 and 1/8 inches. Ostomy is skin level at distal margin, slightly budded at superior (proximal) margin. Peristomal assessment: Not seen today.  2-piece pouching system applied in OR yesterday; pouch is removed, but not skin barrier. Patient denies any peristomal skin issues. Treatment options for stomal/peristomal skin: None today Output: None at this time.  Scant stomal "sweat" in pouch.  Ostomy pouching: 2pc. 2 and 1/4 inch pouching system. Education provided: Patient is provided with three "set-ups":  3 skin barrier rings, 3 cu-to-fit skin barriers and 3 pouches with integrated gas filters.  A sizing guide and a bottle of M9 odor eliminator are provided. I will ask my partner to follow up with Ms. Ogas on Monday. Enrolled patient in [place] program: No WOC nursing team will follow while in house, and will remain available to this patient, the nursing and medical teams.   Thanks, Maudie Flakes, MSN, RN, [place], Arther Abbott  Pager# [phone]

## 2017-03-18 MED ORDER — MELOXICAM 7.5 MG PO TABS
15.0000 mg | ORAL_TABLET | Freq: Every day | ORAL | Status: DC
  Administered 2017-03-18 – 2017-03-19 (×2): 15 mg via ORAL
  Filled 2017-03-18 (×2): qty 2

## 2017-03-18 MED ORDER — AMLODIPINE BESYLATE 10 MG PO TABS
10.0000 mg | ORAL_TABLET | Freq: Every morning | ORAL | Status: DC
  Administered 2017-03-19: 10 mg via ORAL
  Filled 2017-03-18: qty 1

## 2017-03-18 NOTE — Progress Notes (Signed)
AVSS. Tolerating diet well. Ready to advance.  Pain under much better control. Remains on q4h tylenol po. Lungs: Clear. ABD: Soft. Perineum: Clean, scant old drainage. Plan: Advance diet, d/c foley, change to Mobic from Toradol. Ambulate.

## 2017-03-19 MED ORDER — OXYCODONE HCL 5 MG PO TABS: 5.0000 mg | ORAL_TABLET | ORAL | 0 refills | Status: DC | PRN

## 2017-03-19 MED ORDER — BISACODYL 10 MG RE SUPP
10.0000 mg | Freq: Once | RECTAL | Status: AC
  Administered 2017-03-19: 10 mg via RECTAL
  Filled 2017-03-19: qty 1

## 2017-03-19 NOTE — Discharge Summary (Signed)
Physician Discharge Summary  Patient ID: Rachel [name] MRN: [id number] DOB/AGE: 1956-09-01 61 y.o.  Admit date: 03/16/2017 Discharge date: 03/19/2017  Admission Diagnoses:Carcinoma rectum  Discharge Diagnoses:  Active Problems:   CA rectum, adenoca [facility])   Discharged Condition: good  Hospital Course: This is a 61 year old female who was diagnosed with carcinoma rectum in a very low location. She underwent preoperative chemoradiation. After completion of this she has significant sacral rectal pain. An end colostomy and the sigmoid colon was performed at that time with relief of this pain in its entirety. The patient was brought in this time for a planned abdominoperineal resection of the rectum and remaining sigmoid. On 03/16/2017 the patient underwent this procedure which was uneventful. Postoperative course has been fairly stable with the patient now on solid food. Her abdominal incision and perineal incision of both clean and intact. Drainage has been decreasing and was listed at 30 mL the last 24 hours. Patient has minimal pain at this time controlled with the use of oxycodone and Tylenol. She has not had any fecal output from the colostomy but has had plenty of air coming through. Overall the patient is doing extremely well and is ready to go home. Patient is being now discharged and will be followed in my office in about 4 days time for possible removal of the drain in the staples and the abdominal incision. Patient advised to use a mild laxative to get the colostomy working. Consults: None  Significant Diagnostic Studies: None  Treatments: surgery: Abdominoperineal resection  Discharge Exam: Blood pressure 106/67, pulse 89, temperature 99.7 F (37.6 C), temperature source Oral, resp. rate 20, height 5\' 10"  (1.778 m), weight 140 lb (63.5 kg), SpO2 98 %. GI: Soft abdomen with active bowel sounds. Incision is clean and intact. Colostomy with a large amount of air but no fecal material  yet. Perineal wound is intact and free of any signs of infection. His drain with a small amount of some serosanguineous fluid.  Disposition: 01-Home or Self Care  Discharge Instructions    Call MD for:  persistant nausea and vomiting    Complete by:  As directed    Call MD for:  redness, tenderness, or signs of infection (pain, swelling, redness, odor or green/yellow discharge around incision site)    Complete by:  As directed    Call MD for:  severe uncontrolled pain    Complete by:  As directed    Call MD for:  temperature >100.4    Complete by:  As directed    Diet - low sodium heart healthy    Complete by:  As directed    Discharge instructions    Complete by:  As directed    No exertional activity Empty drain 2-3 times a day and record amount May shower     Allergies as of 03/19/2017      Reactions   Prednisone Other (See Comments)   BLISTERS IN MOUTH AND ON TONGUE      Medication List    TAKE these medications   amLODipine 10 MG tablet Commonly known as:  NORVASC Take 1 tablet (10 mg total) by mouth every evening. What changed:  when to take this   clopidogrel 75 MG tablet Commonly known as:  PLAVIX Take 75 mg by mouth every evening.   diphenoxylate-atropine 2.5-0.025 MG tablet Commonly known as:  LOMOTIL Take 1 tablet by mouth 4 (four) times daily as needed for diarrhea or loose stools. Take it along with immodium  isosorbide mononitrate 30 MG 24 hr tablet Commonly known as:  IMDUR Take 1 tablet (30 mg total) by mouth daily. What changed:  when to take this   lisinopril 10 MG tablet Commonly known as:  PRINIVIL,ZESTRIL Take 10 mg by mouth every morning.   nitroGLYCERIN 0.4 MG SL tablet Commonly known as:  NITROSTAT Place 1 tablet (0.4 mg total) under the tongue every 5 (five) minutes as needed for chest pain.   ondansetron 8 MG tablet Commonly known as:  ZOFRAN Take 1 tablet (8 mg total) by mouth 2 (two) times daily as needed (Nausea or vomiting).    oxyCODONE 5 MG immediate release tablet Commonly known as:  Oxy IR/ROXICODONE Take 1-2 tablets (5-10 mg total) by mouth every 4 (four) hours as needed for moderate pain.   potassium chloride SA 20 MEQ tablet Commonly known as:  K-DUR,KLOR-CON TAKE ONE TABLET BY MOUTH TWICE DAILY   prochlorperazine 10 MG tablet Commonly known as:  COMPAZINE Take 1 tablet (10 mg total) by mouth every 6 (six) hours as needed (Nausea or vomiting).   traMADol 50 MG tablet Commonly known as:  ULTRAM Take 50 mg by mouth every 6 (six) hours as needed (for pain.).   venlafaxine XR 37.5 MG 24 hr capsule Commonly known as:  EFFEXOR-XR Take 1 capsule (37.5 mg total) by mouth daily with breakfast.      Follow-up Information    Christene Lye, MD. Go on 03/23/2017.   Specialties:  General Surgery, Radiology Why:  @9am  Contact information: [address] [place] [address] [phone]           Signed: Christene Lye 03/19/2017, 3:39 PM

## 2017-03-19 NOTE — Consult Note (Signed)
[place] Nurse ostomy consult note Stoma type/location: LLQ colostomy Independent in pouching.  Will contact supplier [employer] regarding supplies.  Explained that [facility] required a "brick and mortar" location to provide supplies.  So needed in-state provider, such as [facility] or [facility].  Stomal assessment/size: see previous note Peristomal assessment: intact Treatment options for stomal/peristomal skin: barrier ring and pouch Output No stool in pouch.  Stoma sweat present in pouch.  Ostomy pouching: 2pc. 2 1/4" pouch with barrier ring Education provided: Instructed as to why insurance would not provide supplies.  She has sufficient pouches for discharge, will enroll with [employer].  Enrolled patient in [place] program: No Woc team will follow.   Domenic Moras RN BSN [place] Pager [phone]

## 2017-03-19 NOTE — Progress Notes (Signed)
Drain care was instructed to pt.

## 2017-03-20 LAB — BPAM RBC
BLOOD PRODUCT EXPIRATION DATE: 201805052359
Blood Product Expiration Date: 201805052359
UNIT TYPE AND RH: 5100
Unit Type and Rh: 5100

## 2017-03-20 LAB — TYPE AND SCREEN
ABO/RH(D): O POS
Antibody Screen: NEGATIVE
UNIT DIVISION: 0
Unit division: 0

## 2017-03-21 ENCOUNTER — Telehealth: Payer: Self-pay | Admitting: *Deleted

## 2017-03-21 NOTE — Telephone Encounter (Signed)
I called to check on the patient. She states she is doing well "sore". No stool yet, only flatus. She thinks it will be soon she can feel it moving. She is still ambulating and using a heating pad. JP Drainage is still over 30 a day. Yesterday was 35ml.

## 2017-03-23 ENCOUNTER — Encounter: Payer: Self-pay | Admitting: General Surgery

## 2017-03-23 ENCOUNTER — Ambulatory Visit (INDEPENDENT_AMBULATORY_CARE_PROVIDER_SITE_OTHER): Payer: Medicaid Other | Admitting: General Surgery

## 2017-03-23 VITALS — BP 116/80 | HR 74 | Resp 13 | Ht 67.0 in | Wt 141.0 lb

## 2017-03-23 DIAGNOSIS — C20 Malignant neoplasm of rectum: Secondary | ICD-10-CM

## 2017-03-23 NOTE — Progress Notes (Signed)
Patient ID: Rachel [name], female   DOB: Oct 09, 1956, 61 y.o.   MRN: [id number]  Chief Complaint  Patient presents with  . Routine Post Op    HPI Rachel [name] is a 61 y.o. female here today for her post op perineal resection done on 03/16/2017. Patient states she is very constipated. Denies fever, nausea, vomiting. Colostomy bag had some air and one small hard stool.  Drain sheet present at visit with up to 30 ml emptied over the past 4 days.  HPI  Past Medical History:  Diagnosis Date  . Arthritis   . Body mass index (BMI) of 24.0-24.9 in adult   . Cardiovascular disease   . Current every day smoker    a. ~35 years - > has cut down to < 3/4 ppd  . Headache   . History of UTI   . Hypertension   . Midsternal chest pain    a. 05/2014 Stress Echo: Ex time: 8:09, ECG w/o acute changes, EF nl with possible mid and apical anterior HK-->cath recommended.  . Port-a-cath in place    RIGHT SIDE  . Rectal cancer ([place]) 2017  . Stroke ([place])    07/2016 tia no neurology followup now seen by pcp  . Syncope and collapse   . Thyroid nodule 2018   FINDINGS CONSISTENT WITH A HURTHLE CELL LESION AND/OR NEOPLASM (BETHESDA CATEGORY IV).    Past Surgical History:  Procedure Laterality Date  . ABDOMINAL HYSTERECTOMY    . ABDOMINAL PERINEAL BOWEL RESECTION N/A 03/16/2017   Procedure: ABDOMINAL PERINEAL RESECTION;  Surgeon: Christene Lye, MD;  Location: [facility] ORS;  Service: General;  Laterality: N/A;  . APPENDECTOMY  03/16/2017   Procedure: APPENDECTOMY;  Surgeon: Christene Lye, MD;  Location: [facility] ORS;  Service: General;;  . BACK SURGERY     l 3,4,5  . BIOPSY THYROID Right 11/16/2016   FINDINGS CONSISTENT WITH A HURTHLE CELL LESION AND/OR NEOPLASM (BETHESDA CATEGORY IV).  [name] CHOLECYSTECTOMY    . COLONOSCOPY WITH PROPOFOL N/A 08/15/2016   Procedure: COLONOSCOPY WITH PROPOFOL;  Surgeon: Lollie Sails, MD;  Location: [facility] ENDOSCOPY;  Service: Endoscopy;  Laterality: N/A;  . COLONOSCOPY  WITH PROPOFOL N/A 11/08/2016   Procedure: COLONOSCOPY WITH PROPOFOL;  Surgeon: Christene Lye, MD;  Location: [facility] ENDOSCOPY;  Service: Endoscopy;  Laterality: N/A;  . COLOSTOMY N/A 11/23/2016   Procedure: SIGMOID COLOSTOMY;  Surgeon: Christene Lye, MD;  Location: [facility] ORS;  Service: General;  Laterality: N/A;  . ERCP    . LAPAROSCOPY N/A 11/23/2016   Procedure: LAPAROSCOPY DIAGNOSTIC;  Surgeon: Christene Lye, MD;  Location: [facility] ORS;  Service: General;  Laterality: N/A;  . PARTIAL HYSTERECTOMY    . PORTACATH PLACEMENT N/A 08/18/2016   Procedure: INSERTION PORT-A-CATH;  Surgeon: Christene Lye, MD;  Location: [facility] ORS;  Service: General;  Laterality: N/A;    Family History  Problem Relation Age of Onset  . Breast cancer Mother   . Hyperlipidemia Father   . Heart disease Paternal Grandmother   . Stroke Maternal Grandfather     Social History Social History  Substance Use Topics  . Smoking status: Former Smoker    Packs/day: 0.25    Years: 35.00    Types: Cigarettes    Quit date: 08/11/2016  . Smokeless tobacco: Never Used  . Alcohol use No     Comment: occasional    Allergies  Allergen Reactions  . Prednisone Other (See Comments)    BLISTERS IN MOUTH AND  ON TONGUE    Current Outpatient Prescriptions  Medication Sig Dispense Refill  . amLODipine (NORVASC) 10 MG tablet Take 1 tablet (10 mg total) by mouth every evening. (Patient taking differently: Take 10 mg by mouth every morning. ) 30 tablet 0  . clopidogrel (PLAVIX) 75 MG tablet Take 75 mg by mouth every evening.     . diphenoxylate-atropine (LOMOTIL) 2.5-0.025 MG tablet Take 1 tablet by mouth 4 (four) times daily as needed for diarrhea or loose stools. Take it along with immodium 30 tablet 0  . isosorbide mononitrate (IMDUR) 30 MG 24 hr tablet Take 1 tablet (30 mg total) by mouth daily. (Patient taking differently: Take 30 mg by mouth every morning. ) 90 tablet 3  . lisinopril  (PRINIVIL,ZESTRIL) 10 MG tablet Take 10 mg by mouth every morning.     . nitroGLYCERIN (NITROSTAT) 0.4 MG SL tablet Place 1 tablet (0.4 mg total) under the tongue every 5 (five) minutes as needed for chest pain. 25 tablet 6  . ondansetron (ZOFRAN) 8 MG tablet Take 1 tablet (8 mg total) by mouth 2 (two) times daily as needed (Nausea or vomiting). 30 tablet 1  . oxyCODONE (OXY IR/ROXICODONE) 5 MG immediate release tablet Take 1-2 tablets (5-10 mg total) by mouth every 4 (four) hours as needed for moderate pain. 30 tablet 0  . potassium chloride SA (K-DUR,KLOR-CON) 20 MEQ tablet TAKE ONE TABLET BY MOUTH TWICE DAILY 60 tablet 2  . prochlorperazine (COMPAZINE) 10 MG tablet Take 1 tablet (10 mg total) by mouth every 6 (six) hours as needed (Nausea or vomiting). 30 tablet 1  . traMADol (ULTRAM) 50 MG tablet Take 50 mg by mouth every 6 (six) hours as needed (for pain.).     [name] venlafaxine XR (EFFEXOR-XR) 37.5 MG 24 hr capsule Take 1 capsule (37.5 mg total) by mouth daily with breakfast. 30 capsule 3   No current facility-administered medications for this visit.     Review of Systems Review of Systems  Constitutional: Negative.   Respiratory: Negative.   Cardiovascular: Negative.     Blood pressure 116/80, pulse 74, resp. rate 13, height 5\' 7"  (1.702 m), weight 141 lb (64 kg).  Physical Exam Physical Exam  Constitutional: She is oriented to person, place, and time. She appears well-developed and well-nourished.  Abdominal:    Genitourinary:     Neurological: She is alert and oriented to person, place, and time.  Skin: Skin is warm and dry.   Staples removed  Data Reviewed Notes and Op note reviewed  Pathology - minimal residual cancer in rectum. Three lymph nodes that were identified were negative. Pathologist evaluating for additional lymph nodes.   Assessment   Overall good progress following AP resection.  Slow return of bowel function. No apparent signs of obstruction.      Plan       Patient to return in three days for possible removal of drain. Encouraged patient to continue drinking water and using mild laxative as needed.  HPI, Physical Exam, Assessment and Plan have been scribed under the direction and in the presence of Mckinley Jewel, MD  [name], CMA  I have completed the exam and reviewed the above documentation for accuracy and completeness.  I agree with the above.  Haematologist has been used and any errors in dictation or transcription are unintentional.  [name]. Jamal Collin, M.D., F.A.C.S.  Junie Panning G 03/23/2017, 9:36 AM

## 2017-03-23 NOTE — Patient Instructions (Addendum)
Patient to return in three days for possible removal of drain. Encouraged patient to continue drinking water and using mild laxative as needed.

## 2017-03-26 ENCOUNTER — Encounter: Payer: Self-pay | Admitting: General Surgery

## 2017-03-26 ENCOUNTER — Ambulatory Visit (INDEPENDENT_AMBULATORY_CARE_PROVIDER_SITE_OTHER): Payer: Medicaid Other | Admitting: General Surgery

## 2017-03-26 VITALS — BP 118/64 | HR 76 | Resp 12 | Ht 67.0 in | Wt 141.0 lb

## 2017-03-26 DIAGNOSIS — C20 Malignant neoplasm of rectum: Secondary | ICD-10-CM

## 2017-03-26 NOTE — Patient Instructions (Signed)
Patient to return in one week. Call us to report drainage

## 2017-03-26 NOTE — Progress Notes (Signed)
Patient ID: Rachel [name], female   DOB: June 22, 1956, 61 y.o.   MRN: [id number]  Chief Complaint  Patient presents with  . Routine Post Op    HPI Rachel [name] is a 61 y.o. female here today for her post op perineal resection done on 03/16/2017. She reports she is doing fine, and her constipation has improved. She was scheduled to have her drain removed today, but she had not been recording her drain output for the past 3 days.  HPI  Past Medical History:  Diagnosis Date  . Arthritis   . Body mass index (BMI) of 24.0-24.9 in adult   . Cardiovascular disease   . Current every day smoker    a. ~35 years - > has cut down to < 3/4 ppd  . Headache   . History of UTI   . Hypertension   . Midsternal chest pain    a. 05/2014 Stress Echo: Ex time: 8:09, ECG w/o acute changes, EF nl with possible mid and apical anterior HK-->cath recommended.  . Port-a-cath in place    RIGHT SIDE  . Rectal cancer ([place]) 2017  . Stroke ([place])    07/2016 tia no neurology followup now seen by pcp  . Syncope and collapse   . Thyroid nodule 2018   FINDINGS CONSISTENT WITH A HURTHLE CELL LESION AND/OR NEOPLASM (BETHESDA CATEGORY IV).    Past Surgical History:  Procedure Laterality Date  . ABDOMINAL HYSTERECTOMY    . ABDOMINAL PERINEAL BOWEL RESECTION N/A 03/16/2017   Procedure: ABDOMINAL PERINEAL RESECTION;  Surgeon: Christene Lye, MD;  Location: [facility] ORS;  Service: General;  Laterality: N/A;  . APPENDECTOMY  03/16/2017   Procedure: APPENDECTOMY;  Surgeon: Christene Lye, MD;  Location: [facility] ORS;  Service: General;;  . BACK SURGERY     l 3,4,5  . BIOPSY THYROID Right 11/16/2016   FINDINGS CONSISTENT WITH A HURTHLE CELL LESION AND/OR NEOPLASM (BETHESDA CATEGORY IV).  [name] CHOLECYSTECTOMY    . COLONOSCOPY WITH PROPOFOL N/A 08/15/2016   Procedure: COLONOSCOPY WITH PROPOFOL;  Surgeon: Lollie Sails, MD;  Location: [facility] ENDOSCOPY;  Service: Endoscopy;  Laterality: N/A;  . COLONOSCOPY WITH PROPOFOL N/A  11/08/2016   Procedure: COLONOSCOPY WITH PROPOFOL;  Surgeon: Christene Lye, MD;  Location: [facility] ENDOSCOPY;  Service: Endoscopy;  Laterality: N/A;  . COLOSTOMY N/A 11/23/2016   Procedure: SIGMOID COLOSTOMY;  Surgeon: Christene Lye, MD;  Location: [facility] ORS;  Service: General;  Laterality: N/A;  . ERCP    . LAPAROSCOPY N/A 11/23/2016   Procedure: LAPAROSCOPY DIAGNOSTIC;  Surgeon: Christene Lye, MD;  Location: [facility] ORS;  Service: General;  Laterality: N/A;  . PARTIAL HYSTERECTOMY    . PORTACATH PLACEMENT N/A 08/18/2016   Procedure: INSERTION PORT-A-CATH;  Surgeon: Christene Lye, MD;  Location: [facility] ORS;  Service: General;  Laterality: N/A;    Family History  Problem Relation Age of Onset  . Breast cancer Mother   . Hyperlipidemia Father   . Heart disease Paternal Grandmother   . Stroke Maternal Grandfather     Social History Social History  Substance Use Topics  . Smoking status: Former Smoker    Packs/day: 0.25    Years: 35.00    Types: Cigarettes    Quit date: 08/11/2016  . Smokeless tobacco: Never Used  . Alcohol use No     Comment: occasional    Allergies  Allergen Reactions  . Prednisone Other (See Comments)    BLISTERS IN MOUTH AND ON TONGUE  Current Outpatient Prescriptions  Medication Sig Dispense Refill  . amLODipine (NORVASC) 10 MG tablet Take 1 tablet (10 mg total) by mouth every evening. (Patient taking differently: Take 10 mg by mouth every morning. ) 30 tablet 0  . clopidogrel (PLAVIX) 75 MG tablet Take 75 mg by mouth every evening.     . diphenoxylate-atropine (LOMOTIL) 2.5-0.025 MG tablet Take 1 tablet by mouth 4 (four) times daily as needed for diarrhea or loose stools. Take it along with immodium 30 tablet 0  . isosorbide mononitrate (IMDUR) 30 MG 24 hr tablet Take 1 tablet (30 mg total) by mouth daily. (Patient taking differently: Take 30 mg by mouth every morning. ) 90 tablet 3  . lisinopril (PRINIVIL,ZESTRIL) 10 MG tablet  Take 10 mg by mouth every morning.     . nitroGLYCERIN (NITROSTAT) 0.4 MG SL tablet Place 1 tablet (0.4 mg total) under the tongue every 5 (five) minutes as needed for chest pain. 25 tablet 6  . ondansetron (ZOFRAN) 8 MG tablet Take 1 tablet (8 mg total) by mouth 2 (two) times daily as needed (Nausea or vomiting). 30 tablet 1  . oxyCODONE (OXY IR/ROXICODONE) 5 MG immediate release tablet Take 1-2 tablets (5-10 mg total) by mouth every 4 (four) hours as needed for moderate pain. 30 tablet 0  . potassium chloride SA (K-DUR,KLOR-CON) 20 MEQ tablet TAKE ONE TABLET BY MOUTH TWICE DAILY 60 tablet 2  . prochlorperazine (COMPAZINE) 10 MG tablet Take 1 tablet (10 mg total) by mouth every 6 (six) hours as needed (Nausea or vomiting). 30 tablet 1  . traMADol (ULTRAM) 50 MG tablet Take 50 mg by mouth every 6 (six) hours as needed (for pain.).     [name] venlafaxine XR (EFFEXOR-XR) 37.5 MG 24 hr capsule Take 1 capsule (37.5 mg total) by mouth daily with breakfast. 30 capsule 3   No current facility-administered medications for this visit.     Review of Systems Review of Systems  Constitutional: Negative.   Respiratory: Negative.   Cardiovascular: Negative.     Blood pressure 118/64, pulse 76, resp. rate 12, height 5\' 7"  (1.702 m), weight 141 lb (64 kg).  Physical Exam Physical Exam  Data Reviewed Prior notes reviewed  Assessment    Drain is still putting out old blood. Checked for clots and put a new bulb on. Will leave it in and have patient record output, and call when it's putting out < 15 ml / day to have it taken out.      Plan      Patient to return in one week. Call us to report drainage   I have completed the exam and reviewed the above documentation for accuracy and completeness.  I agree with the above.  Haematologist has been used and any errors in dictation or transcription are unintentional.  Izek Corvino G. Jamal Collin, M.D., F.A.C.S.  Junie Panning G 03/27/2017, 11:15  AM

## 2017-03-28 ENCOUNTER — Telehealth: Payer: Self-pay | Admitting: *Deleted

## 2017-03-28 ENCOUNTER — Ambulatory Visit: Payer: Medicaid Other | Admitting: Radiation Oncology

## 2017-03-28 ENCOUNTER — Telehealth: Payer: Self-pay

## 2017-03-28 NOTE — Telephone Encounter (Signed)
Patient called and states that she is in pain and wanted something for this. She states that she has used up her prescription for Oxycodone that she had for her initial surgery. She asks that she could have something prescribed today if possible.

## 2017-03-28 NOTE — Telephone Encounter (Signed)
Dr Jamal Collin wrote an RX for oxycodone 1 po q6 hr prn pain #20. Aware that it will have to be picked up at the office.

## 2017-03-29 IMAGING — CT CT ABD-PELV W/ CM
1 of 3 series · 13 of 32 positions shown, 18 images · IV contrast (APPLIED)
Comparison: 09/15/2014 and 07/22/2014 .

CLINICAL DATA: Worsening lower abdominal pain. Bright red blood per
rectum. " "Rectal mass." Prior hysterectomy and cholecystectomy.

EXAM:
CT ABDOMEN AND PELVIS WITH CONTRAST
TECHNIQUE: Multidetector CT imaging of the abdomen and pelvis was performed
using the standard protocol following bolus administration of
intravenous contrast.
CONTRAST:  100mL 1KHVRQ-DVV IOPAMIDOL (1KHVRQ-DVV) INJECTION 61%

[Series 2: axial st · axial · 0.70mm/px · z∈[-953,-578]mm · 13 of 85 slices shown, 18 images]
[im 5/85  soft-tissue]
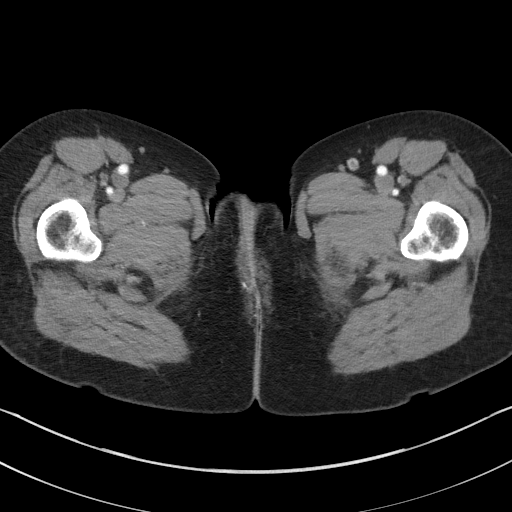
[im 5/85  bone]
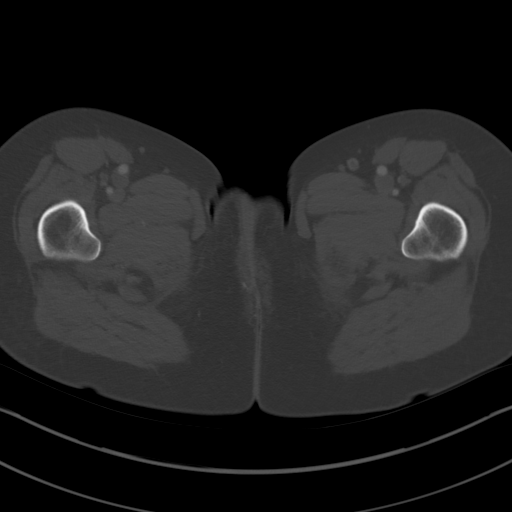
[im 14/85  soft-tissue]
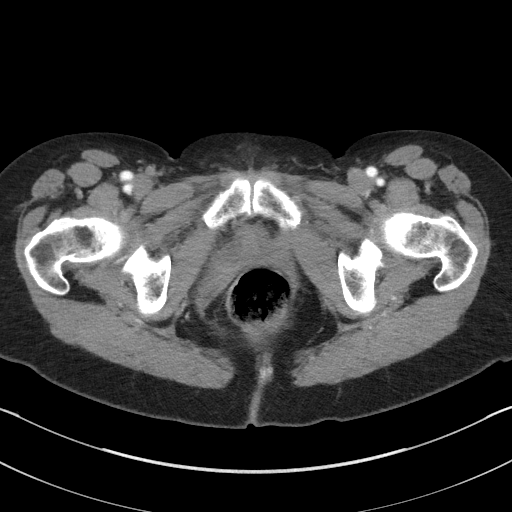
[im 18/85  soft-tissue]
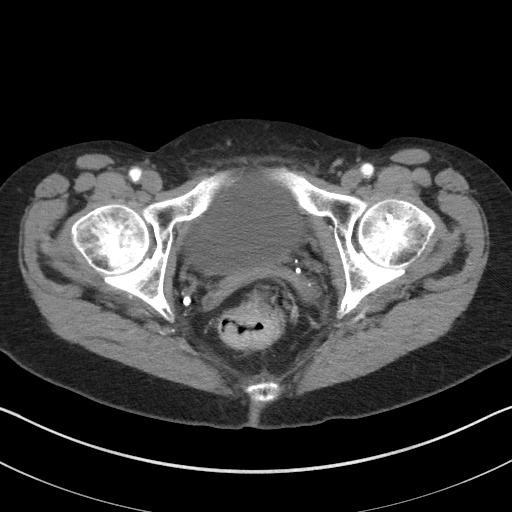
[im 27/85  soft-tissue]
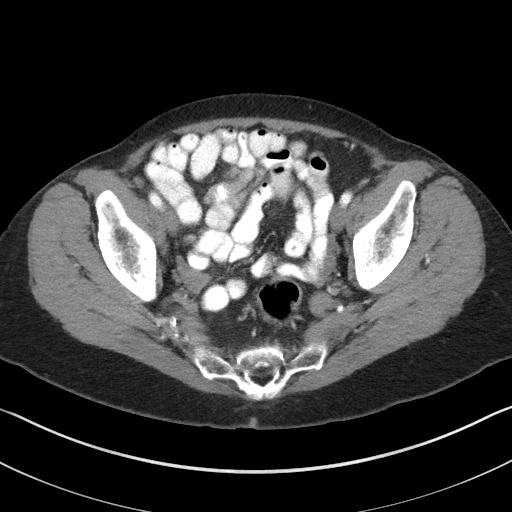
[im 31/85  soft-tissue]
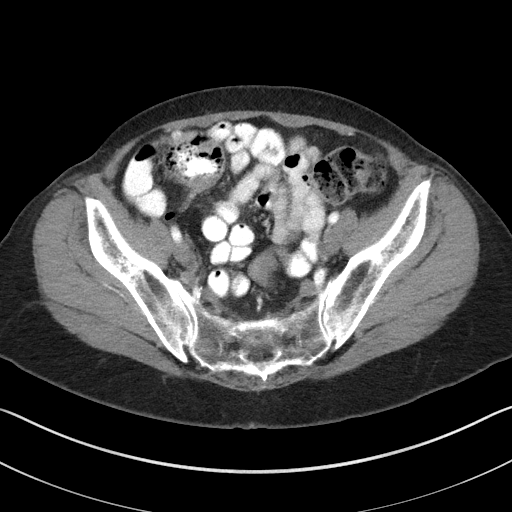
[im 40/85  soft-tissue]
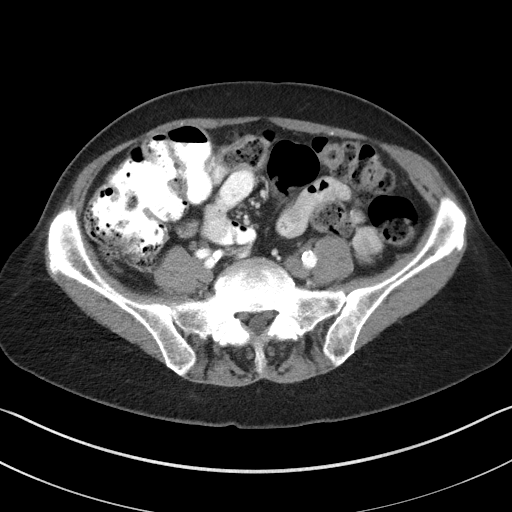
[im 45/85  soft-tissue]
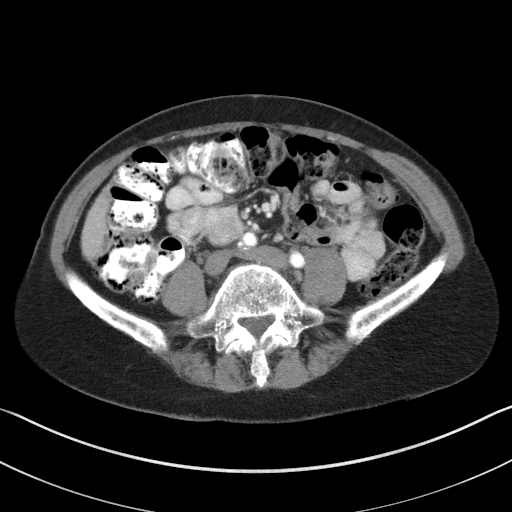
[im 54/85  soft-tissue]
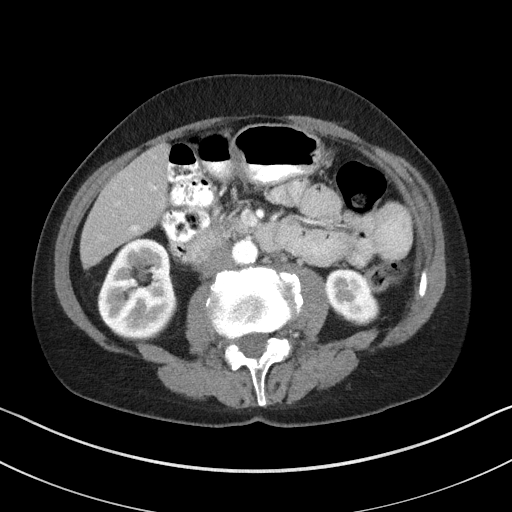
[im 58/85  soft-tissue]
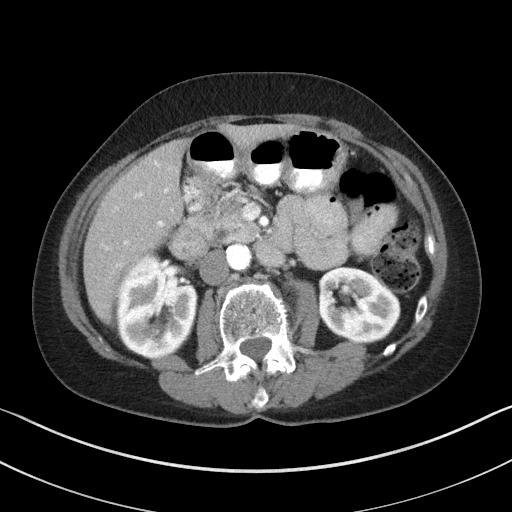
[im 58/85  bone]
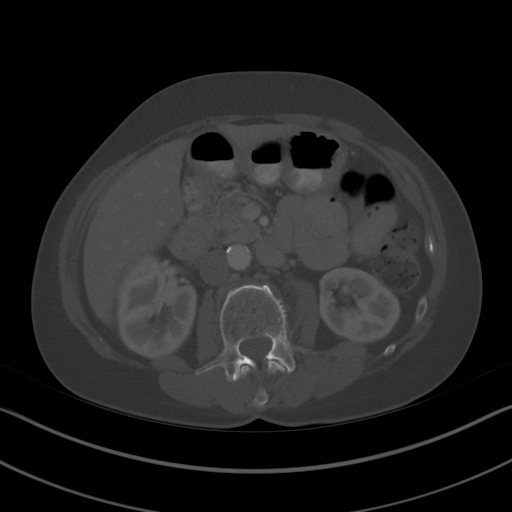
[im 67/85  soft-tissue]
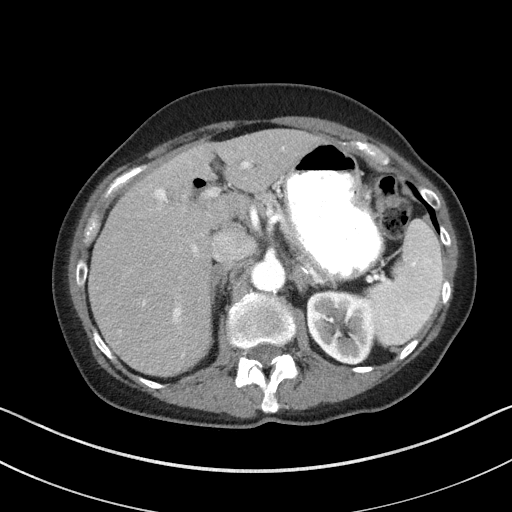
[im 67/85  lung]
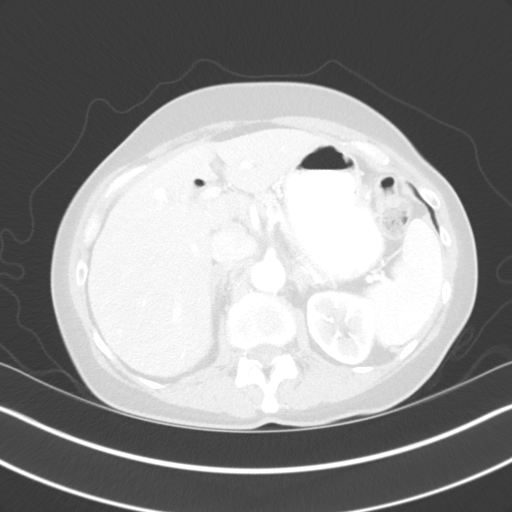
[im 71/85  soft-tissue]
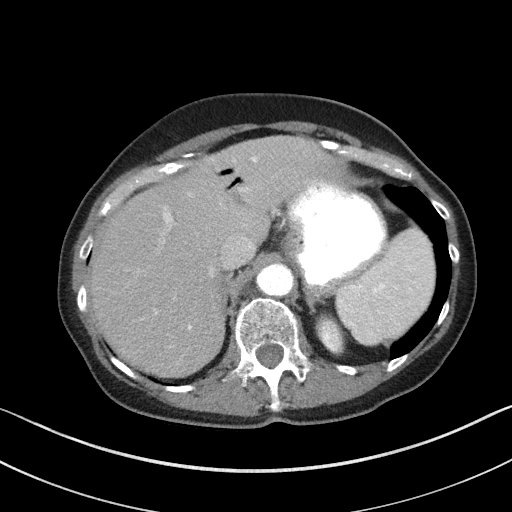
[im 71/85  lung]
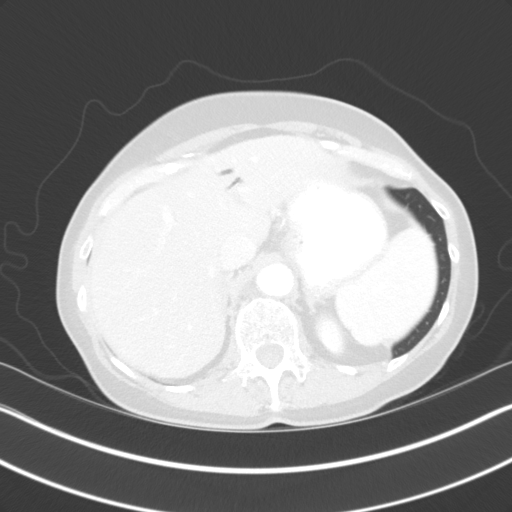
[im 76/85  lung]
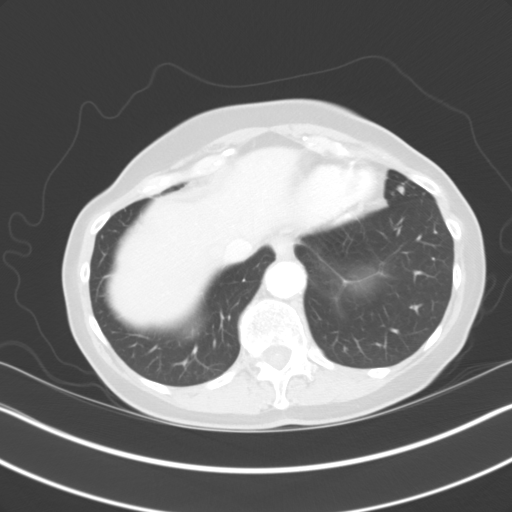
[im 80/85  soft-tissue]
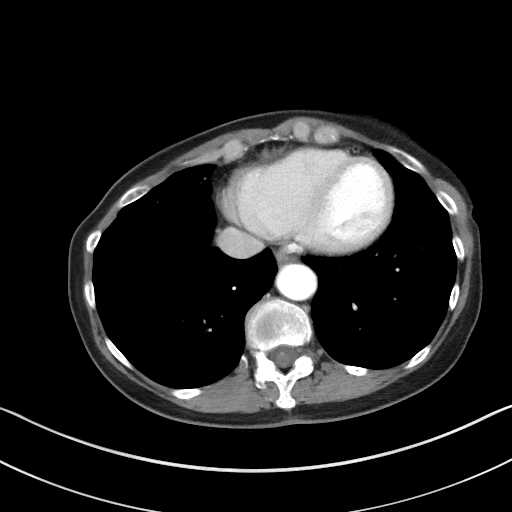
[im 80/85  lung]
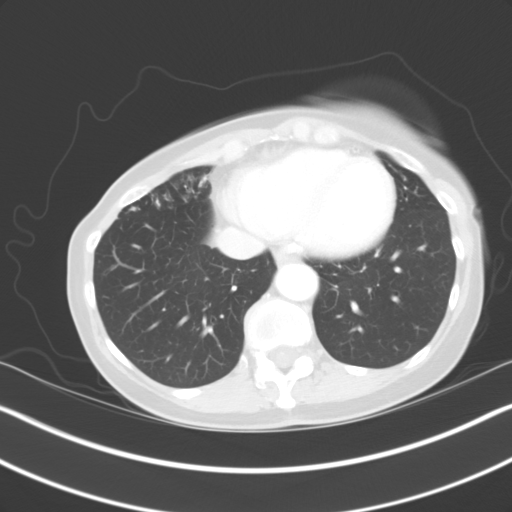

[13 of 32 positions shown; findings below may reference images not displayed]

FINDINGS: Lower chest: right middle lobe bronchiectasis and mucous plugging
are mild and similar. More subtle findings also identified within
the lingula. Borderline cardiomegaly, without pericardial or pleural
effusion

Hepatobiliary: No focal liver lesion. Pneumobilia. Cholecystectomy,
without biliary ductal dilatation.

Pancreas: Normal, without mass or ductal dilatation.

Spleen:  Normal in size, without focal abnormality.

Adrenals/Urinary Tract: Bilateral adrenal thickening, without
well-defined dominant lesion. This similar. An interpolar left renal
hypo attenuating lesion is favored to represent a cyst or minimally
complex cyst. This is unchanged in size back to [DATE]/ 0428, 11 mm.
Normal right kidney, without hydronephrosis. Normal urinary bladder.

Stomach/Bowel: Normal stomach, without wall thickening.

Rectal mass, as evidenced by irregular asymmetric wall thickening
including on images 64-67/series 2. Probable direct tumor extension
into the mesorectum (versus a contiguous necrotic node). This
measures 2.3 x 2.0 cm at the 8 o'clock position on image 63/series
2. No high-grade obstruction is seen. There is large colonic stool
burden throughout the more proximal colon. Normal terminal ileum and
appendix. Normal small bowel.

Vascular/Lymphatic: Aortic and branch vessel atherosclerosis. No
retroperitoneal or retrocrural adenopathy. No pelvic sidewall
adenopathy. A node in the sigmoid mesocolon measures 6 mm on image
56/series 2, upper normal. Right-sided mesorectal nodes measure up
to 8 mm on image 63/series 2, suspicious

Reproductive: Hysterectomy.  No adnexal mass.

Other: No significant free fluid. No evidence of omental or
peritoneal disease.

Musculoskeletal: Degenerative disc disease, most advanced at L4-5.
IMPRESSION: 1. Locally advanced rectal carcinoma. Probable direct extension into
the mesorectum with suspicious perirectal and sigmoid meso colon
nodes.
2. No evidence of distant metastasis.
3. No high-grade obstruction. There is proximal large volume colonic
stool, suggesting a component of constipation.
4. Right middle lobe and lingular findings which are likely related
to chronic atypical infection and are similar to 0428.

## 2017-04-03 ENCOUNTER — Ambulatory Visit (INDEPENDENT_AMBULATORY_CARE_PROVIDER_SITE_OTHER): Payer: Medicaid Other | Admitting: General Surgery

## 2017-04-03 ENCOUNTER — Encounter: Payer: Self-pay | Admitting: General Surgery

## 2017-04-03 VITALS — BP 118/74 | HR 68 | Resp 14 | Ht 67.0 in | Wt 137.0 lb

## 2017-04-03 DIAGNOSIS — C20 Malignant neoplasm of rectum: Secondary | ICD-10-CM

## 2017-04-03 NOTE — Patient Instructions (Addendum)
Return in two weeks. Instructions to call with questions or concerns.

## 2017-04-03 NOTE — Progress Notes (Signed)
Patient ID: Rachel [name], female   DOB: 09/22/1956, 61 y.o.   MRN: [id number]  Chief Complaint  Patient presents with  . Follow-up    HPI Rachel [name] is a 61 y.o. female for her post op abdomino-perineal resection done on 03/16/2017. She reports she is doing fine, her constipation has improved, but the drain is bothersome. Drain sheet present.  She is having problems urinating.   HPI  Past Medical History:  Diagnosis Date  . Arthritis   . Body mass index (BMI) of 24.0-24.9 in adult   . Cardiovascular disease   . Current every day smoker    a. ~35 years - > has cut down to < 3/4 ppd  . Headache   . History of UTI   . Hypertension   . Midsternal chest pain    a. 05/2014 Stress Echo: Ex time: 8:09, ECG w/o acute changes, EF nl with possible mid and apical anterior HK-->cath recommended.  . Port-a-cath in place    RIGHT SIDE  . Rectal cancer ([place]) 2017  . Stroke ([place])    07/2016 tia no neurology followup now seen by pcp  . Syncope and collapse   . Thyroid nodule 2018   FINDINGS CONSISTENT WITH A HURTHLE CELL LESION AND/OR NEOPLASM (BETHESDA CATEGORY IV).    Past Surgical History:  Procedure Laterality Date  . ABDOMINAL HYSTERECTOMY    . ABDOMINAL PERINEAL BOWEL RESECTION N/A 03/16/2017   Procedure: ABDOMINAL PERINEAL RESECTION;  Surgeon: Christene Lye, MD;  Location: [facility] ORS;  Service: General;  Laterality: N/A;  . APPENDECTOMY  03/16/2017   Procedure: APPENDECTOMY;  Surgeon: Christene Lye, MD;  Location: [facility] ORS;  Service: General;;  . BACK SURGERY     l 3,4,5  . BIOPSY THYROID Right 11/16/2016   FINDINGS CONSISTENT WITH A HURTHLE CELL LESION AND/OR NEOPLASM (BETHESDA CATEGORY IV).  [name] CHOLECYSTECTOMY    . COLONOSCOPY WITH PROPOFOL N/A 08/15/2016   Procedure: COLONOSCOPY WITH PROPOFOL;  Surgeon: Lollie Sails, MD;  Location: [facility] ENDOSCOPY;  Service: Endoscopy;  Laterality: N/A;  . COLONOSCOPY WITH PROPOFOL N/A 11/08/2016   Procedure: COLONOSCOPY WITH  PROPOFOL;  Surgeon: Christene Lye, MD;  Location: [facility] ENDOSCOPY;  Service: Endoscopy;  Laterality: N/A;  . COLOSTOMY N/A 11/23/2016   Procedure: SIGMOID COLOSTOMY;  Surgeon: Christene Lye, MD;  Location: [facility]S;  Service: General;  Laterality: N/A;  . ERCP    . LAPAROSCOPY N/A 11/23/2016   Procedure: LAPAROSCOPY DIAGNOSTIC;  Surgeon: Christene Lye, MD;  Location: [facility] ORS;  Service: General;  Laterality: N/A;  . PARTIAL HYSTERECTOMY    . PORTACATH PLACEMENT N/A 08/18/2016   Procedure: INSERTION PORT-A-CATH;  Surgeon: Christene Lye, MD;  Location: [facility] ORS;  Service: General;  Laterality: N/A;    Family History  Problem Relation Age of Onset  . Breast cancer Mother   . Hyperlipidemia Father   . Heart disease Paternal Grandmother   . Stroke Maternal Grandfather     Social History Social History  Substance Use Topics  . Smoking status: Former Smoker    Packs/day: 0.25    Years: 35.00    Types: Cigarettes    Quit date: 08/11/2016  . Smokeless tobacco: Never Used  . Alcohol use No     Comment: occasional    Allergies  Allergen Reactions  . Prednisone Other (See Comments)    BLISTERS IN MOUTH AND ON TONGUE    Current Outpatient Prescriptions  Medication Sig Dispense Refill  . amLODipine (  NORVASC) 10 MG tablet Take 1 tablet (10 mg total) by mouth every evening. (Patient taking differently: Take 10 mg by mouth every morning. ) 30 tablet 0  . clopidogrel (PLAVIX) 75 MG tablet Take 75 mg by mouth every evening.     . diphenoxylate-atropine (LOMOTIL) 2.5-0.025 MG tablet Take 1 tablet by mouth 4 (four) times daily as needed for diarrhea or loose stools. Take it along with immodium 30 tablet 0  . isosorbide mononitrate (IMDUR) 30 MG 24 hr tablet Take 1 tablet (30 mg total) by mouth daily. (Patient taking differently: Take 30 mg by mouth every morning. ) 90 tablet 3  . lisinopril (PRINIVIL,ZESTRIL) 10 MG tablet Take 10 mg by mouth every morning.     .  nitroGLYCERIN (NITROSTAT) 0.4 MG SL tablet Place 1 tablet (0.4 mg total) under the tongue every 5 (five) minutes as needed for chest pain. 25 tablet 6  . ondansetron (ZOFRAN) 8 MG tablet Take 1 tablet (8 mg total) by mouth 2 (two) times daily as needed (Nausea or vomiting). 30 tablet 1  . oxyCODONE (OXY IR/ROXICODONE) 5 MG immediate release tablet Take 1-2 tablets (5-10 mg total) by mouth every 4 (four) hours as needed for moderate pain. 30 tablet 0  . potassium chloride SA (K-DUR,KLOR-CON) 20 MEQ tablet TAKE ONE TABLET BY MOUTH TWICE DAILY 60 tablet 2  . prochlorperazine (COMPAZINE) 10 MG tablet Take 1 tablet (10 mg total) by mouth every 6 (six) hours as needed (Nausea or vomiting). 30 tablet 1  . traMADol (ULTRAM) 50 MG tablet Take 50 mg by mouth every 6 (six) hours as needed (for pain.).     [name] venlafaxine XR (EFFEXOR-XR) 37.5 MG 24 hr capsule Take 1 capsule (37.5 mg total) by mouth daily with breakfast. 30 capsule 3   No current facility-administered medications for this visit.     Review of Systems Review of Systems  Constitutional: Negative.   Respiratory: Negative.   Cardiovascular: Negative.     Blood pressure 118/74, pulse 68, resp. rate 14, height 5\' 7"  (1.702 m), weight 137 lb (62.1 kg).  Physical Exam Physical Exam  Data Reviewed Prior notes reviewed.  Drain sheet reviewed - putting out 30-63ml / day since visit last week.   Assessment    Drain is still putting out 30-33ml/day, but it is very bothersome and painful for the patient. Agreed to remove the drain and informed the patient that if fluid begins to collect in the rectal cavity we may have to put in another drain. Patient was agreeable.  Drain was removed and site was covered with 4x4 gauze and tape. Instructed patient to keep it clean and covered with gauze or pads, and informed her that it may continue to drain a bit.     Plan    Patient to return in two week. Instructions to call with questions or concerns.    HPI, Physical Exam, Assessment and Plan have been scribed under the direction and in the presence of Mckinley Jewel, MD  [name], CMA      I have completed the exam and reviewed the above documentation for accuracy and completeness.  I agree with the above.  Haematologist has been used and any errors in dictation or transcription are unintentional.  Liesel Peckenpaugh G. Jamal Collin, M.D., F.A.C.S.   Junie Panning G 04/04/2017, 2:13 PM

## 2017-04-04 ENCOUNTER — Telehealth: Payer: Self-pay | Admitting: Internal Medicine

## 2017-04-04 ENCOUNTER — Telehealth: Payer: Self-pay

## 2017-04-04 ENCOUNTER — Other Ambulatory Visit: Payer: Self-pay | Admitting: *Deleted

## 2017-04-04 DIAGNOSIS — C775 Secondary and unspecified malignant neoplasm of intrapelvic lymph nodes: Principal | ICD-10-CM

## 2017-04-04 DIAGNOSIS — C20 Malignant neoplasm of rectum: Secondary | ICD-10-CM

## 2017-04-04 NOTE — Telephone Encounter (Signed)
Patient instructed to come at 815 am for labs and she will see md at 830 on 04/06/17

## 2017-04-04 NOTE — Telephone Encounter (Signed)
This pt needs to be seen this Friday 4/27 at 8:30 to discuss re: re-starting chemo/labs- cbc/cmp/cea. Thx

## 2017-04-04 NOTE — Telephone Encounter (Signed)
Message left for scheduling at [facility] to set the patient up for a follow up appointment with Dr Rogue Bussing.

## 2017-04-06 ENCOUNTER — Inpatient Hospital Stay (HOSPITAL_BASED_OUTPATIENT_CLINIC_OR_DEPARTMENT_OTHER): Payer: Medicaid Other | Admitting: Internal Medicine

## 2017-04-06 ENCOUNTER — Telehealth: Payer: Self-pay | Admitting: *Deleted

## 2017-04-06 ENCOUNTER — Inpatient Hospital Stay: Payer: Medicaid Other | Attending: Internal Medicine | Admitting: *Deleted

## 2017-04-06 ENCOUNTER — Other Ambulatory Visit: Payer: Self-pay | Admitting: *Deleted

## 2017-04-06 VITALS — BP 121/78 | HR 116 | Temp 100.9°F | Resp 16

## 2017-04-06 DIAGNOSIS — E041 Nontoxic single thyroid nodule: Secondary | ICD-10-CM

## 2017-04-06 DIAGNOSIS — Z87891 Personal history of nicotine dependence: Secondary | ICD-10-CM | POA: Insufficient documentation

## 2017-04-06 DIAGNOSIS — E876 Hypokalemia: Secondary | ICD-10-CM | POA: Diagnosis not present

## 2017-04-06 DIAGNOSIS — Z9221 Personal history of antineoplastic chemotherapy: Secondary | ICD-10-CM

## 2017-04-06 DIAGNOSIS — F329 Major depressive disorder, single episode, unspecified: Secondary | ICD-10-CM

## 2017-04-06 DIAGNOSIS — I2510 Atherosclerotic heart disease of native coronary artery without angina pectoris: Secondary | ICD-10-CM | POA: Diagnosis not present

## 2017-04-06 DIAGNOSIS — Z923 Personal history of irradiation: Secondary | ICD-10-CM

## 2017-04-06 DIAGNOSIS — R300 Dysuria: Secondary | ICD-10-CM

## 2017-04-06 DIAGNOSIS — C20 Malignant neoplasm of rectum: Secondary | ICD-10-CM | POA: Diagnosis present

## 2017-04-06 DIAGNOSIS — C775 Secondary and unspecified malignant neoplasm of intrapelvic lymph nodes: Secondary | ICD-10-CM | POA: Diagnosis not present

## 2017-04-06 DIAGNOSIS — I10 Essential (primary) hypertension: Secondary | ICD-10-CM | POA: Insufficient documentation

## 2017-04-06 DIAGNOSIS — Z7902 Long term (current) use of antithrombotics/antiplatelets: Secondary | ICD-10-CM | POA: Diagnosis not present

## 2017-04-06 DIAGNOSIS — Z8673 Personal history of transient ischemic attack (TIA), and cerebral infarction without residual deficits: Secondary | ICD-10-CM | POA: Insufficient documentation

## 2017-04-06 LAB — CBC WITH DIFFERENTIAL/PLATELET
Basophils Absolute: 0.1 10*3/uL (ref 0–0.1)
Basophils Relative: 1 %
Eosinophils Absolute: 0.1 10*3/uL (ref 0–0.7)
Eosinophils Relative: 1 %
HEMATOCRIT: 27.3 % — AB (ref 35.0–47.0)
HEMOGLOBIN: 9.4 g/dL — AB (ref 12.0–16.0)
LYMPHS ABS: 0.4 10*3/uL — AB (ref 1.0–3.6)
Lymphocytes Relative: 3 %
MCH: 29 pg (ref 26.0–34.0)
MCHC: 34.5 g/dL (ref 32.0–36.0)
MCV: 84.1 fL (ref 80.0–100.0)
MONOS PCT: 8 %
Monocytes Absolute: 0.9 10*3/uL (ref 0.2–0.9)
NEUTROS ABS: 10.1 10*3/uL — AB (ref 1.4–6.5)
Neutrophils Relative %: 87 %
Platelets: 327 10*3/uL (ref 150–440)
RBC: 3.25 MIL/uL — ABNORMAL LOW (ref 3.80–5.20)
RDW: 14.6 % — ABNORMAL HIGH (ref 11.5–14.5)
WBC: 11.4 10*3/uL — ABNORMAL HIGH (ref 3.6–11.0)

## 2017-04-06 LAB — COMPREHENSIVE METABOLIC PANEL
ALK PHOS: 113 U/L (ref 38–126)
ALT: 22 U/L (ref 14–54)
ANION GAP: 11 (ref 5–15)
AST: 57 U/L — ABNORMAL HIGH (ref 15–41)
Albumin: 2.9 g/dL — ABNORMAL LOW (ref 3.5–5.0)
BILIRUBIN TOTAL: 0.5 mg/dL (ref 0.3–1.2)
BUN: 10 mg/dL (ref 6–20)
CALCIUM: 8.6 mg/dL — AB (ref 8.9–10.3)
CO2: 28 mmol/L (ref 22–32)
Chloride: 92 mmol/L — ABNORMAL LOW (ref 101–111)
Creatinine, Ser: 0.72 mg/dL (ref 0.44–1.00)
Glucose, Bld: 138 mg/dL — ABNORMAL HIGH (ref 65–99)
Potassium: 2.8 mmol/L — ABNORMAL LOW (ref 3.5–5.1)
Sodium: 131 mmol/L — ABNORMAL LOW (ref 135–145)
TOTAL PROTEIN: 7.1 g/dL (ref 6.5–8.1)

## 2017-04-06 LAB — URINALYSIS, COMPLETE (UACMP) WITH MICROSCOPIC
BILIRUBIN URINE: NEGATIVE
Glucose, UA: NEGATIVE mg/dL
Ketones, ur: 5 mg/dL — AB
NITRITE: NEGATIVE
PH: 5 (ref 5.0–8.0)
Protein, ur: 30 mg/dL — AB
SPECIFIC GRAVITY, URINE: 1.023 (ref 1.005–1.030)

## 2017-04-06 MED ORDER — CIPROFLOXACIN HCL 500 MG PO TABS: 500.0000 mg | ORAL_TABLET | Freq: Two times a day (BID) | ORAL | 0 refills | Status: DC

## 2017-04-06 MED ORDER — HYDROCODONE-ACETAMINOPHEN 5-325 MG PO TABS: 1.0000 | ORAL_TABLET | Freq: Four times a day (QID) | ORAL | 0 refills | Status: DC | PRN

## 2017-04-06 NOTE — Telephone Encounter (Signed)
Spoke with patient. Pt made aware that cipro rx was sent to pharmacy due to UTI. Explained to patient that cultures are pending. Pt educated to drink plenty of fluids with cipro to avoid risk of renal side effects.

## 2017-04-06 NOTE — Assessment & Plan Note (Addendum)
#   Rectal  Cancer- stage III- s/p neoadjuvant chemoradiation with 5-FU continuous infusion with radiation; s/p APR April 7th 2018. I reviewed the pathology in detail. Partial response post-chemoradiation.  # I discussed the need for adjuvant chemotherapy FOLFOX every 2 weeks total of 10 treatments. Discussed that the goal of treatment being cure.   # Discussed the potential side effects including but not limited to-increasing fatigue, nausea vomiting, diarrhea, hair loss, sores in the mouth, increase risk of infection and also neuropathy.   # Pain in rectal region-worsened after surgery-  Given script for hydrocodone. Discussed with Dr. Jamal Collin feels this is likely postsurgical pain.   # ? Dysuria-US suggestive of possible UTI; urine culture pending. Start the patient on ciprofloxacin.  # Hypokalemia- 2.8; recommend BID; has script at home.   # Depression- recommend continued Effexor.  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 However hold it given The need for systemic adjuvant therapy. Also discussed with Dr.sankar.  # follow up appx 2 weeks/labs; will plan chemo FOLFOX the following week.

## 2017-04-06 NOTE — Progress Notes (Signed)
[place] OFFICE PROGRESS NOTE  Patient Care Team: Lynnell Jude, MD as PCP - General (Family Medicine) Seeplaputhur Robinette Haines, MD (General Surgery)  Cancer Staging No matching staging information was found for the patient.   Oncology History   # SEP 2017- RECTAL CA mod diff adeno; STAGE III [no EUS; positive ~33m LN; Dr.sankar] CEA-7; Sep 21st START 5FU-RT [finished NOV 2017]; April 7th APR- 2018- [id number] [5LN]; positive for LVI/PNI [poor response to chemo]; Margins-Negative.  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          [place]   08/16/2016 Initial Diagnosis    Rectal cancer metastasized to intrapelvic lymph node ([facility]       INTERVAL HISTORY:  Rachel [id number].  female patient with rectal cancer is Currently post chemoradiation Nov 2017- Is currently status Abdominoperineal resection approximately 3 weeks ago for rectal cancer.  Patient continues to complain of pain in the perirectal region- post surgery. Patient has been taking tramadol without significant improvement of the pain.  She also complains of dysuria. No fever no chills.  Denies any back pain.  She denies any significant problems with her colostomy back. Denies any blood in stools. No nausea or vomiting. Otherwise no chest pain or shortness of breath or cough.   REVIEW OF SYSTEMS:  A complete 10 point review of system is done which is negative except mentioned above/history of present illness.   PAST MEDICAL HISTORY :  Past Medical History:  Diagnosis Date  . Arthritis   . Body mass index (BMI) of 24.0-24.9 in adult   . Cardiovascular disease   . Current every day smoker    a. ~35 years - > has  cut down to < 3/4 ppd  . Headache   . History of UTI   . Hypertension   . Midsternal chest pain    a. 05/2014 Stress Echo: Ex time: 8:09, ECG w/o acute changes, EF nl with possible mid and apical anterior HK-->cath recommended.  . Port-a-cath in place    RIGHT SIDE  . [place]  . [place]    07/2016 tia no neurology followup now seen by pcp  . Syncope and collapse   . Thyroid nodule 2018   FINDINGS CONSISTENT WITH A HURTHLE CELL LESION AND/OR NEOPLASM (BETHESDA CATEGORY IV).    PAST SURGICAL HISTORY :   Past Surgical History:  Procedure Laterality Date  . ABDOMINAL HYSTERECTOMY    . ABDOMINAL PERINEAL BOWEL RESECTION N/A 03/16/2017   Procedure: ABDOMINAL PERINEAL RESECTION;  Surgeon: SChristene Lye MD;  Location: [facility] ORS;  Service: General;  Laterality: N/A;  . APPENDECTOMY  03/16/2017   Procedure: APPENDECTOMY;  Surgeon: SChristene Lye MD;  Location: [facility] ORS;  Service: General;;  . BACK SURGERY     l 3,4,5  . BIOPSY THYROID Right 11/16/2016   FINDINGS CONSISTENT WITH A HURTHLE CELL LESION AND/OR NEOPLASM (BETHESDA CATEGORY IV).  .Marland KitchenCHOLECYSTECTOMY    . COLONOSCOPY WITH PROPOFOL N/A 08/15/2016   Procedure: COLONOSCOPY WITH PROPOFOL;  Surgeon: MLollie Sails MD;  [facility];  Service: Endoscopy;  Laterality: N/A;  . COLONOSCOPY WITH PROPOFOL N/A 11/08/2016   Procedure: COLONOSCOPY WITH PROPOFOL;  Surgeon: SDaryel Gerald  Robinette Haines, MD;  Location: [facility] ENDOSCOPY;  Service: Endoscopy;  Laterality: N/A;  . COLOSTOMY N/A 11/23/2016   Procedure: SIGMOID COLOSTOMY;  Surgeon: Christene Lye, MD;  Location: [facility] ORS;  Service: General;  Laterality: N/A;  . ERCP    . LAPAROSCOPY N/A 11/23/2016   Procedure: LAPAROSCOPY DIAGNOSTIC;  Surgeon: Christene Lye, MD;  Location: [facility] ORS;  Service: General;  Laterality: N/A;  . PARTIAL HYSTERECTOMY    . PORTACATH PLACEMENT N/A 08/18/2016   Procedure: INSERTION PORT-A-CATH;  Surgeon:  Christene Lye, MD;  Location: [facility] ORS;  Service: General;  Laterality: N/A;    FAMILY HISTORY :  Mom- gall bladder ca; breast cancer- 79s; mat grand ma- stomach cancer- 50s; sblings- 2 no cancers; 2 daughters- one daughter? Cervical  Family History  Problem Relation Age of Onset  . Breast cancer Mother   . Hyperlipidemia Father   . Heart disease Paternal Grandmother   . Stroke Maternal Grandfather     SOCIAL HISTORY:   Social History  Substance Use Topics  . Smoking status: Former Smoker    Packs/day: 0.25    Years: 35.00    Types: Cigarettes    Quit date: 08/11/2016  . Smokeless tobacco: Never Used  . Alcohol use No     Comment: occasional    ALLERGIES:  is allergic to prednisone.  MEDICATIONS:  Current Outpatient Prescriptions  Medication Sig Dispense Refill  . amLODipine (NORVASC) 10 MG tablet Take 1 tablet (10 mg total) by mouth every evening. (Patient taking differently: Take 10 mg by mouth every morning. ) 30 tablet 0  . clopidogrel (PLAVIX) 75 MG tablet Take 75 mg by mouth every evening.     [name] lisinopril (PRINIVIL,ZESTRIL) 10 MG tablet Take 10 mg by mouth every morning.     . potassium chloride SA (K-DUR,KLOR-CON) 20 MEQ tablet TAKE ONE TABLET BY MOUTH TWICE DAILY 60 tablet 2  . traMADol (ULTRAM) 50 MG tablet Take 50 mg by mouth every 6 (six) hours as needed (for pain.).     [name] venlafaxine XR (EFFEXOR-XR) 37.5 MG 24 hr capsule Take 1 capsule (37.5 mg total) by mouth daily with breakfast. 30 capsule 3  . ciprofloxacin (CIPRO) 500 MG tablet Take 1 tablet (500 mg total) by mouth 2 (two) times daily. 14 tablet 0  . diphenoxylate-atropine (LOMOTIL) 2.5-0.025 MG tablet Take 1 tablet by mouth 4 (four) times daily as needed for diarrhea or loose stools. Take it along with immodium (Patient not taking: Reported on 04/06/2017) 30 tablet 0  . HYDROcodone-acetaminophen (NORCO/VICODIN) 5-325 MG tablet Take 1 tablet by mouth every 6 (six) hours as needed for moderate pain. 60  tablet 0  . nitroGLYCERIN (NITROSTAT) 0.4 MG SL tablet Place 1 tablet (0.4 mg total) under the tongue every 5 (five) minutes as needed for chest pain. (Patient not taking: Reported on 04/06/2017) 25 tablet 6  . oxyCODONE (OXY IR/ROXICODONE) 5 MG immediate release tablet Take 1-2 tablets (5-10 mg total) by mouth every 4 (four) hours as needed for moderate pain. (Patient not taking: Reported on 04/06/2017) 30 tablet 0   No current facility-administered medications for this visit.     PHYSICAL EXAMINATION: ECOG PERFORMANCE STATUS: 1 - Symptomatic but completely ambulatory  BP 121/78 (BP Location: Left Arm, Patient Position: Supine)   Pulse (!) 116   Temp (!) 100.9 F (38.3 C) (Tympanic)   Resp 16   There were no vitals filed for this visit.  GENERAL: Well-nourished well-developed; Alert, no distress and comfortable.  Accompanied by her daughter.  EYES: no pallor or icterus OROPHARYNX: no thrush or ulceration; good dentition  NECK: supple, no masses felt LYMPH:  no palpable lymphadenopathy in the cervical, axillary or inguinal regions LUNGS: clear to auscultation and  No wheeze or crackles HEART/CVS: regular rate & rhythm and no murmurs; No lower extremity edema ABDOMEN:abdomen soft, non-tender and normal bowel sounds Musculoskeletal:no cyanosis of digits and no clubbing  PSYCH: alert & oriented x 3; flat affect NEURO: no focal motor/sensory deficits SKIN:  no rashes or significant lesions  LABORATORY DATA:  I have reviewed the data as listed    Component Value Date/Time   NA 131 (L) 04/06/2017 0833   NA 140 03/08/2017 1223   NA 144 07/25/2014 0444   K 2.8 (L) 04/06/2017 0833   K 3.7 07/25/2014 0444   CL 92 (L) 04/06/2017 0833   CL 107 07/25/2014 0444   CO2 28 04/06/2017 0833   CO2 30 07/25/2014 0444   GLUCOSE 138 (H) 04/06/2017 0833   GLUCOSE 87 07/25/2014 0444   BUN 10 04/06/2017 0833   BUN 13 03/08/2017 1223   BUN 8 07/25/2014 0444   CREATININE 0.72 04/06/2017 0833    CREATININE 0.69 07/25/2014 0444   CALCIUM 8.6 (L) 04/06/2017 0833   CALCIUM 8.4 (L) 07/25/2014 0444   PROT 7.1 04/06/2017 0833   PROT 6.8 03/08/2017 1223   PROT 6.5 07/25/2014 0444   ALBUMIN 2.9 (L) 04/06/2017 0833   ALBUMIN 4.2 03/08/2017 1223   ALBUMIN 2.6 (L) 07/25/2014 0444   AST 57 (H) 04/06/2017 0833   AST 96 (H) 07/25/2014 0444   ALT 22 04/06/2017 0833   ALT 123 (H) 07/25/2014 0444   ALKPHOS 113 04/06/2017 0833   ALKPHOS 189 (H) 07/25/2014 0444   BILITOT 0.5 04/06/2017 0833   BILITOT 0.2 03/08/2017 1223   BILITOT 2.0 (H) 07/25/2014 0444   GFRNONAA >60 04/06/2017 0833   GFRNONAA >60 07/25/2014 0444   GFRAA >60 04/06/2017 0833   GFRAA >60 07/25/2014 0444    No results found for: SPEP, UPEP  Lab Results  Component Value Date   WBC 11.4 (H) 04/06/2017   NEUTROABS 10.1 (H) 04/06/2017   HGB 9.4 (L) 04/06/2017   HCT 27.3 (L) 04/06/2017   MCV 84.1 04/06/2017   PLT 327 04/06/2017      Chemistry      Component Value Date/Time   NA 131 (L) 04/06/2017 0833   NA 140 03/08/2017 1223   NA 144 07/25/2014 0444   K 2.8 (L) 04/06/2017 0833   K 3.7 07/25/2014 0444   CL 92 (L) 04/06/2017 0833   CL 107 07/25/2014 0444   CO2 28 04/06/2017 0833   CO2 30 07/25/2014 0444   BUN 10 04/06/2017 0833   BUN 13 03/08/2017 1223   BUN 8 07/25/2014 0444   CREATININE 0.72 04/06/2017 0833   CREATININE 0.69 07/25/2014 0444      Component Value Date/Time   CALCIUM 8.6 (L) 04/06/2017 0833   CALCIUM 8.4 (L) 07/25/2014 0444   ALKPHOS 113 04/06/2017 0833   ALKPHOS 189 (H) 07/25/2014 0444   AST 57 (H) 04/06/2017 0833   AST 96 (H) 07/25/2014 0444   ALT 22 04/06/2017 0833   ALT 123 (H) 07/25/2014 0444   BILITOT 0.5 04/06/2017 0833   BILITOT 0.2 03/08/2017 1223   BILITOT 2.0 (H) 07/25/2014 0444     IMPRESSION: 1. Ill-defined circumferential wall thickening in the distal rectum with perirectal edema/inflammation. The soft tissue nodule contiguous with  the right aspect of the rectum on  the prior study, likely representing direct distention, has decreased in the interval. Small perirectal lymph nodes persist. 2. No evidence for metastatic disease elsewhere in the abdomen or pelvis   Electronically Signed   By: Misty Stanley M.D.   On: 11/15/2016 17:23  RADIOGRAPHIC STUDIES: I have personally reviewed the radiological images as listed and agreed with the findings in the report. No results found.   ASSESSMENT & PLAN:  Rectal cancer metastasized to intrapelvic lymph node ([place]) # Rectal  Cancer- stage III- s/p neoadjuvant chemoradiation with 5-FU continuous infusion with radiation; s/p APR April 7th 2018. I reviewed the pathology in detail. Partial response post-chemoradiation.  # I discussed the need for adjuvant chemotherapy FOLFOX every 2 weeks total of 10 treatments. Discussed that the goal of treatment being cure.   # Discussed the potential side effects including but not limited to-increasing fatigue, nausea vomiting, diarrhea, hair loss, sores in the mouth, increase risk of infection and also neuropathy.   # Pain in rectal region-worsened after surgery-  Given script for hydrocodone. Discussed with Dr. Jamal Collin feels this is likely postsurgical pain.   # ? Dysuria-US suggestive of possible UTI; urine culture pending. Start the patient on ciprofloxacin.  # Hypokalemia- 2.8; recommend BID; has script at home.   # Depression- recommend continued Effexor.  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 However hold it given The need for systemic adjuvant therapy. Also discussed with Dr.sankar.  # follow up appx 2 weeks/labs; will plan chemo FOLFOX the following week.    Orders Placed This Encounter  Procedures  . Urinalysis, Complete w Microscopic  . Comprehensive metabolic panel    Standing Status:   Future    Standing Expiration Date:   04/06/2018  . CBC with Differential/Platelet    Standing  Status:   Future    Standing Expiration Date:   04/06/2018       Cammie Sickle, MD 04/07/2017 10:48 AM

## 2017-04-06 NOTE — Telephone Encounter (Signed)
-----   Message from Cammie Sickle, MD sent at 04/06/2017 10:45 AM EDT ----- Possible UTI- recommend cipro 500 BID x 7 days. Please call in.

## 2017-04-06 NOTE — Progress Notes (Signed)
Patient is here to follow-up with Dr. Rogue Bussing. Patient here to discuss further treatment plans with md.  She has a history of rectal cancer and has had a recent abdomino-perineal resection (performed on 03/16/2017). Pt reports that her perirectal drain was removed by Dr. Jamal Collin on Tuesday 04/03/17.  She reports intense rectal pain 7/10. She is only taking Tramadol 50 mg 1 tablet every 6 hrs for rectal pain. She does not have any more oxycodone. Pt requested RF on oxycodone today if at all possible. She states that her pain was relieved with the combination of tramadol and oxycodone.   Pt reports difficulty with urination. She has to "stand to urinate" unable to urinate if she is sitting on the toilet. Also reports "burning with urination"

## 2017-04-07 LAB — URINE CULTURE: Culture: <10000 — AB

## 2017-04-07 LAB — CEA: CEA: 2.8 ng/mL (ref 0.0–4.7)

## 2017-04-07 NOTE — Progress Notes (Signed)
DISCONTINUE ON PATHWAY REGIMEN - Colorectal     Duration of XRT:     5-Fluorouracil        Dose Mod: None  **Always confirm dose/schedule in your pharmacy ordering system**    REASON: Continuation Of Treatment PRIOR TREATMENT: ROS1: 5-Fluorouracil 225 mg/m2 Continuous IV Infusion for the Duration of Radiation Therapy TREATMENT RESPONSE: Partial Response (PR)  START ON PATHWAY REGIMEN - Colorectal     A cycle is every 14 days:     Oxaliplatin      Leucovorin      5-Fluorouracil      5-Fluorouracil   **Always confirm dose/schedule in your pharmacy ordering system**    Patient Characteristics: Rectal Neoadjuvant/Adjuvant, T3 - T4, N0 or Any T, N+**, Postoperative - Had Neoadjuvant Therapy - Preoperative Node Positive Current evidence of distant metastases? No AJCC T Category: T3 AJCC N Category: N1 AJCC M Category: M0 AJCC 8 Stage Grouping: IIIB  Intent of Therapy: Curative Intent, Discussed with Patient

## 2017-04-09 ENCOUNTER — Telehealth: Payer: Self-pay

## 2017-04-09 NOTE — Telephone Encounter (Signed)
Patient's daughter called and states that the patient had felt a pop like sensation near her rectum and right after had a large amount of dark red and brown fowl smelling drainage from her rectum. She states that she is no longer having this and it seems to have stopped. No reports of pain or fever. Notified Dr Jamal Collin and he related that this was probably a hematoma that had drained and not unexpected. She should use a pad to catch any further drainage, and to watch out for any severe pain, fevers, or chills. She is scheduled to follow up on 04/17/17. The patient's daughter is aware of instructions.

## 2017-04-17 ENCOUNTER — Encounter: Payer: Self-pay | Admitting: General Surgery

## 2017-04-17 ENCOUNTER — Ambulatory Visit (INDEPENDENT_AMBULATORY_CARE_PROVIDER_SITE_OTHER): Payer: Medicaid Other | Admitting: General Surgery

## 2017-04-17 VITALS — BP 112/78 | HR 98 | Resp 14 | Ht 66.0 in | Wt 138.0 lb

## 2017-04-17 DIAGNOSIS — D34 Benign neoplasm of thyroid gland: Secondary | ICD-10-CM

## 2017-04-17 DIAGNOSIS — C20 Malignant neoplasm of rectum: Secondary | ICD-10-CM

## 2017-04-17 NOTE — Patient Instructions (Signed)
Follow-up in 6 weeks

## 2017-04-17 NOTE — Progress Notes (Signed)
Patient ID: Rachel [name], female   DOB: 08-06-1956, 61 y.o.   MRN: [id number]  Chief Complaint  Patient presents with  . Follow-up    HPI Rachel [name] is a 61 y.o. female.  Here for her post op abdomino-perineal resection done on 03/16/2017. She reports she is doing fine, her constipation has improved. She states the area opened up and drained last Monday, clear drainage since.  HPI  Past Medical History:  Diagnosis Date  . Arthritis   . Body mass index (BMI) of 24.0-24.9 in adult   . Cardiovascular disease   . Current every day smoker    a. ~35 years - > has cut down to < 3/4 ppd  . Headache   . History of UTI   . Hypertension   . Midsternal chest pain    a. 05/2014 Stress Echo: Ex time: 8:09, ECG w/o acute changes, EF nl with possible mid and apical anterior HK-->cath recommended.  . Port-a-cath in place    RIGHT SIDE  . Rectal cancer ([place]) 2017  . Stroke ([place])    07/2016 tia no neurology followup now seen by pcp  . Syncope and collapse   . Thyroid nodule 2018   FINDINGS CONSISTENT WITH A HURTHLE CELL LESION AND/OR NEOPLASM (BETHESDA CATEGORY IV).    Past Surgical History:  Procedure Laterality Date  . ABDOMINAL HYSTERECTOMY    . ABDOMINAL PERINEAL BOWEL RESECTION N/A 03/16/2017   Procedure: ABDOMINAL PERINEAL RESECTION;  Surgeon: Christene Lye, MD;  Location: [facility] ORS;  Service: General;  Laterality: N/A;  . APPENDECTOMY  03/16/2017   Procedure: APPENDECTOMY;  Surgeon: Christene Lye, MD;  Location: [facility] ORS;  Service: General;;  . BACK SURGERY     l 3,4,5  . BIOPSY THYROID Right 11/16/2016   FINDINGS CONSISTENT WITH A HURTHLE CELL LESION AND/OR NEOPLASM (BETHESDA CATEGORY IV).  [name] CHOLECYSTECTOMY    . COLONOSCOPY WITH PROPOFOL N/A 08/15/2016   Procedure: COLONOSCOPY WITH PROPOFOL;  Surgeon: Lollie Sails, MD;  Location[facility];  Service: Endoscopy;  Laterality: N/A;  . COLONOSCOPY WITH PROPOFOL N/A 11/08/2016   Procedure: COLONOSCOPY WITH  PROPOFOL;  Surgeon: Christene Lye, MD;  Location: [facility] ENDOSCOPY;  Service: Endoscopy;  Laterality: N/A;  . COLOSTOMY N/A 11/23/2016   Procedure: SIGMOID COLOSTOMY;  Surgeon: Christene Lye, MD;  Location: [facility] ORS;  Service: General;  Laterality: N/A;  . ERCP    . LAPAROSCOPY N/A 11/23/2016   Procedure: LAPAROSCOPY DIAGNOSTIC;  Surgeon: Christene Lye, MD;  Location: [facility] ORS;  Service: General;  Laterality: N/A;  . PARTIAL HYSTERECTOMY    . PORTACATH PLACEMENT N/A 08/18/2016   Procedure: INSERTION PORT-A-CATH;  Surgeon: Christene Lye, MD;  Location: [facility] ORS;  Service: General;  Laterality: N/A;    Family History  Problem Relation Age of Onset  . Breast cancer Mother   . Hyperlipidemia Father   . Heart disease Paternal Grandmother   . Stroke Maternal Grandfather     Social History Social History  Substance Use Topics  . Smoking status: Former Smoker    Packs/day: 0.25    Years: 35.00    Types: Cigarettes    Quit date: 08/11/2016  . Smokeless tobacco: Never Used  . Alcohol use No     Comment: occasional    Allergies  Allergen Reactions  . Prednisone Other (See Comments)    BLISTERS IN MOUTH AND ON TONGUE    Current Outpatient Prescriptions  Medication Sig Dispense Refill  . ciprofloxacin (  CIPRO) 500 MG tablet Take 1 tablet (500 mg total) by mouth 2 (two) times daily. 14 tablet 0  . clopidogrel (PLAVIX) 75 MG tablet Take 75 mg by mouth every evening.     . diphenoxylate-atropine (LOMOTIL) 2.5-0.025 MG tablet Take 1 tablet by mouth 4 (four) times daily as needed for diarrhea or loose stools. Take it along with immodium 30 tablet 0  . lisinopril (PRINIVIL,ZESTRIL) 10 MG tablet Take 10 mg by mouth every morning.     . nitroGLYCERIN (NITROSTAT) 0.4 MG SL tablet Place 1 tablet (0.4 mg total) under the tongue every 5 (five) minutes as needed for chest pain. 25 tablet 6  . oxyCODONE (OXY IR/ROXICODONE) 5 MG immediate release tablet Take 1-2 tablets  (5-10 mg total) by mouth every 4 (four) hours as needed for moderate pain. 30 tablet 0  . potassium chloride SA (K-DUR,KLOR-CON) 20 MEQ tablet TAKE ONE TABLET BY MOUTH TWICE DAILY 60 tablet 2  . traMADol (ULTRAM) 50 MG tablet Take 50 mg by mouth every 6 (six) hours as needed (for pain.).     [name] venlafaxine XR (EFFEXOR-XR) 37.5 MG 24 hr capsule Take 1 capsule (37.5 mg total) by mouth daily with breakfast. 30 capsule 3   No current facility-administered medications for this visit.     Review of Systems Review of Systems  Constitutional: Negative.   Respiratory: Negative.   Cardiovascular: Negative.     Blood pressure 112/78, pulse 98, resp. rate 14, height 5\' 6"  (1.676 m), weight 138 lb (62.6 kg).  Physical Exam Physical Exam  Constitutional: She is oriented to person, place, and time. She appears well-developed and well-nourished.  Eyes: Conjunctivae are normal. Pupils are equal, round, and reactive to light. No scleral icterus.  Neck: Neck supple. No tracheal deviation present. Thyroid mass present.    Cardiovascular: Normal rate, regular rhythm and normal heart sounds.   Pulmonary/Chest: Effort normal and breath sounds normal.  Abdominal: Soft. Bowel sounds are normal. There is no tenderness.    Genitourinary:     Lymphadenopathy:    She has no cervical adenopathy.  Neurological: She is alert and oriented to person, place, and time.  Skin: Skin is warm and dry.  Psychiatric: Her behavior is normal.    Data Reviewed FNA biopsy and thyroid ultrasound reviewed   Assessment    1. Carcinoma of rectum, stage III. Status post neoadjuvent chemoradiation, AP resection. Pathology showed partial response post-chemoradiation. Incision site at anus has small 2-3 cm opening, and is draining a small amount of clear fluid.  Planning continued chemotherapy with Dr. Rogue Bussing, every 2 weeks total of 10 treatments.  2. 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 Will need  thyroidectomy after chemo completed.     Plan    Follow up in 6 weeks     HPI, Physical Exam, Assessment and Plan have been scribed under the direction and in the presence of Mckinley Jewel, MD  Karie Fetch, RN  I have completed the exam and reviewed the above documentation for accuracy and completeness.  I agree with the above.  Haematologist has been used and any errors in dictation or transcription are unintentional.  [name]. Jamal Collin, M.D., F.A.C.S.  Junie Panning G 04/17/2017, 11:23 AM

## 2017-04-23 ENCOUNTER — Inpatient Hospital Stay: Payer: Medicaid Other | Attending: Internal Medicine

## 2017-04-23 ENCOUNTER — Inpatient Hospital Stay (HOSPITAL_BASED_OUTPATIENT_CLINIC_OR_DEPARTMENT_OTHER): Payer: Medicaid Other | Admitting: Internal Medicine

## 2017-04-23 VITALS — BP 114/77 | HR 90 | Temp 97.5°F | Resp 16 | Wt 136.1 lb

## 2017-04-23 DIAGNOSIS — E041 Nontoxic single thyroid nodule: Secondary | ICD-10-CM | POA: Diagnosis not present

## 2017-04-23 DIAGNOSIS — E876 Hypokalemia: Secondary | ICD-10-CM | POA: Insufficient documentation

## 2017-04-23 DIAGNOSIS — Z79899 Other long term (current) drug therapy: Secondary | ICD-10-CM

## 2017-04-23 DIAGNOSIS — C775 Secondary and unspecified malignant neoplasm of intrapelvic lymph nodes: Secondary | ICD-10-CM | POA: Insufficient documentation

## 2017-04-23 DIAGNOSIS — I2510 Atherosclerotic heart disease of native coronary artery without angina pectoris: Secondary | ICD-10-CM | POA: Insufficient documentation

## 2017-04-23 DIAGNOSIS — C20 Malignant neoplasm of rectum: Secondary | ICD-10-CM | POA: Diagnosis not present

## 2017-04-23 DIAGNOSIS — Z5111 Encounter for antineoplastic chemotherapy: Secondary | ICD-10-CM | POA: Insufficient documentation

## 2017-04-23 DIAGNOSIS — I10 Essential (primary) hypertension: Secondary | ICD-10-CM | POA: Insufficient documentation

## 2017-04-23 DIAGNOSIS — Z87891 Personal history of nicotine dependence: Secondary | ICD-10-CM | POA: Diagnosis not present

## 2017-04-23 DIAGNOSIS — F329 Major depressive disorder, single episode, unspecified: Secondary | ICD-10-CM | POA: Insufficient documentation

## 2017-04-23 DIAGNOSIS — Z8673 Personal history of transient ischemic attack (TIA), and cerebral infarction without residual deficits: Secondary | ICD-10-CM | POA: Diagnosis not present

## 2017-04-23 LAB — COMPREHENSIVE METABOLIC PANEL
ALT: 9 U/L — ABNORMAL LOW (ref 14–54)
AST: 17 U/L (ref 15–41)
Albumin: 3.2 g/dL — ABNORMAL LOW (ref 3.5–5.0)
Alkaline Phosphatase: 100 U/L (ref 38–126)
Anion gap: 9 (ref 5–15)
BILIRUBIN TOTAL: 0.3 mg/dL (ref 0.3–1.2)
BUN: 14 mg/dL (ref 6–20)
CO2: 27 mmol/L (ref 22–32)
Calcium: 8.8 mg/dL — ABNORMAL LOW (ref 8.9–10.3)
Chloride: 101 mmol/L (ref 101–111)
Creatinine, Ser: 0.72 mg/dL (ref 0.44–1.00)
GFR calc Af Amer: >60 mL/min (ref >60)
Glucose, Bld: 85 mg/dL (ref 65–99)
POTASSIUM: 4 mmol/L (ref 3.5–5.1)
Sodium: 137 mmol/L (ref 135–145)
TOTAL PROTEIN: 7.3 g/dL (ref 6.5–8.1)

## 2017-04-23 LAB — CBC WITH DIFFERENTIAL/PLATELET
Basophils Absolute: 0.1 10*3/uL (ref 0–0.1)
Basophils Relative: 1 %
EOS ABS: 0.1 10*3/uL (ref 0–0.7)
EOS PCT: 1 %
HCT: 30.3 % — ABNORMAL LOW (ref 35.0–47.0)
Hemoglobin: 10.2 g/dL — ABNORMAL LOW (ref 12.0–16.0)
LYMPHS ABS: 0.7 10*3/uL — AB (ref 1.0–3.6)
LYMPHS PCT: 11 %
MCH: 28.2 pg (ref 26.0–34.0)
MCHC: 33.8 g/dL (ref 32.0–36.0)
MCV: 83.4 fL (ref 80.0–100.0)
MONO ABS: 0.5 10*3/uL (ref 0.2–0.9)
Monocytes Relative: 8 %
NEUTROS PCT: 79 %
Neutro Abs: 5.2 10*3/uL (ref 1.4–6.5)
PLATELETS: 381 10*3/uL (ref 150–440)
RBC: 3.63 MIL/uL — ABNORMAL LOW (ref 3.80–5.20)
RDW: 16.8 % — AB (ref 11.5–14.5)
WBC: 6.6 10*3/uL (ref 3.6–11.0)

## 2017-04-23 NOTE — Progress Notes (Signed)
[place] OFFICE PROGRESS NOTE  Patient Care Team: Lynnell Jude, MD as PCP - General (Family Medicine) Christene Lye, MD (General Surgery)  Cancer Staging No matching staging information was found for the patient.   Oncology History   # SEP 2017- RECTAL CA mod diff adeno; STAGE III [no EUS; positive ~62m LN; Dr.sankar] CEA-7; Sep 21st START 5FU-RT [finished NOV 2017]; April 7th APR- 2018- [id number] [5LN]; positive for LVI/PNI [poor response to chemo]; Margins-Negative.  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  female patient with rectal cancer is Currently post chemoradiation Nov 2017- Is currently status Abdominoperineal resection approximately 6 weeks ago for rectal cancer.  At last visit she was diagnosed with UTI. Status post treated with antibiotics. Symptoms improved.  Pain in the abdominal/rectal area is improved.  She denies any significant problems with her colostomy back. Denies any blood in stools. No nausea or vomiting. Otherwise no chest pain or shortness of breath or cough.   REVIEW OF SYSTEMS:  A complete 10 point review of system is done which is negative except mentioned above/history of present illness.   PAST MEDICAL HISTORY :  Past Medical History:  Diagnosis Date  . Arthritis   . Body mass index (BMI) of 24.0-24.9 in adult   . Cardiovascular disease   . Current every day smoker    a. ~35 years - > has cut down to < 3/4 ppd  . Headache   . History of UTI   . Hypertension   .  Midsternal chest pain    a. 05/2014 Stress Echo: Ex time: 8:09, ECG w/o acute changes, EF nl with possible mid and apical anterior HK-->cath recommended.  . Port-a-cath in place    RIGHT SIDE  . [place]  [place]    07/2016 tia no neurology followup now seen by pcp  . Syncope and collapse   . Thyroid nodule 2018   FINDINGS CONSISTENT WITH A HURTHLE CELL LESION AND/OR NEOPLASM (BETHESDA CATEGORY IV).    PAST SURGICAL HISTORY :   Past Surgical History:  Procedure Laterality Date  . ABDOMINAL HYSTERECTOMY    . ABDOMINAL PERINEAL BOWEL RESECTION N/A 03/16/2017   Procedure: ABDOMINAL PERINEAL RESECTION;  Surgeon: SChristene Lye MD;  Location: [facility] ORS;  Service: General;  Laterality: N/A;  . APPENDECTOMY  03/16/2017   Procedure: APPENDECTOMY;  Surgeon: SChristene Lye MD;  Location: [facility] ORS;  Service: General;;  . BACK SURGERY     l 3,4,5  . BIOPSY THYROID Right 11/16/2016   FINDINGS CONSISTENT WITH A HURTHLE CELL LESION AND/OR NEOPLASM (BETHESDA CATEGORY IV).  .Marland KitchenCHOLECYSTECTOMY    . COLONOSCOPY WITH PROPOFOL N/A 08/15/2016   Procedure: COLONOSCOPY WITH PROPOFOL;  Surgeon: MLollie Sails MD;  [facility];  Service: Endoscopy;  Laterality: N/A;  . COLONOSCOPY WITH PROPOFOL N/A 11/08/2016   Procedure: COLONOSCOPY WITH PROPOFOL;  Surgeon: SChristene Lye MD;  Location: [facility] ENDOSCOPY;  Service: Endoscopy;  Laterality: N/A;  .  COLOSTOMY N/A 11/23/2016   Procedure: SIGMOID COLOSTOMY;  Surgeon: Christene Lye, MD;  Location: [facility] ORS;  Service: General;  Laterality: N/A;  . ERCP    . LAPAROSCOPY N/A 11/23/2016   Procedure: LAPAROSCOPY DIAGNOSTIC;  Surgeon: Christene Lye, MD;  Location: [facility] ORS;  Service: General;  Laterality: N/A;  . PARTIAL HYSTERECTOMY    . PORTACATH PLACEMENT N/A 08/18/2016   Procedure: INSERTION PORT-A-CATH;  Surgeon: Christene Lye, MD;  Location: [facility] ORS;  Service: General;  Laterality: N/A;     FAMILY HISTORY :  Mom- gall bladder ca; breast cancer- 1s; mat grand ma- stomach cancer- 27s; sblings- 2 no cancers; 2 daughters- one daughter? Cervical  Family History  Problem Relation Age of Onset  . Breast cancer Mother   . Hyperlipidemia Father   . Heart disease Paternal Grandmother   . Stroke Maternal Grandfather     SOCIAL HISTORY:   Social History  Substance Use Topics  . Smoking status: Former Smoker    Packs/day: 0.25    Years: 35.00    Types: Cigarettes    Quit date: 08/11/2016  . Smokeless tobacco: Never Used  . Alcohol use No     Comment: occasional    ALLERGIES:  is allergic to prednisone.  MEDICATIONS:  Current Outpatient Prescriptions  Medication Sig Dispense Refill  . lisinopril (PRINIVIL,ZESTRIL) 10 MG tablet Take 5 mg by mouth every morning.     [name] oxyCODONE (OXY IR/ROXICODONE) 5 MG immediate release tablet Take 1-2 tablets (5-10 mg total) by mouth every 4 (four) hours as needed for moderate pain. 30 tablet 0  . potassium chloride SA (K-DUR,KLOR-CON) 20 MEQ tablet TAKE ONE TABLET BY MOUTH TWICE DAILY 60 tablet 2  . traMADol (ULTRAM) 50 MG tablet Take 50 mg by mouth every 6 (six) hours as needed (for pain.).     [name] venlafaxine XR (EFFEXOR-XR) 37.5 MG 24 hr capsule Take 1 capsule (37.5 mg total) by mouth daily with breakfast. 30 capsule 3  . clopidogrel (PLAVIX) 75 MG tablet Take 75 mg by mouth every evening.     . diphenoxylate-atropine (LOMOTIL) 2.5-0.025 MG tablet Take 1 tablet by mouth 4 (four) times daily as needed for diarrhea or loose stools. Take it along with immodium (Patient not taking: Reported on 04/23/2017) 30 tablet 0  . nitroGLYCERIN (NITROSTAT) 0.4 MG SL tablet Place 1 tablet (0.4 mg total) under the tongue every 5 (five) minutes as needed for chest pain. (Patient not taking: Reported on 04/23/2017) 25 tablet 6   No current facility-administered medications for this visit.     PHYSICAL EXAMINATION: ECOG PERFORMANCE STATUS: 1 - Symptomatic but  completely ambulatory  BP 114/77 (BP Location: Right Arm, Patient Position: Sitting)   Pulse 90   Temp 97.5 F (36.4 C) (Tympanic)   Resp 16   Wt 136 lb 2 oz (61.7 kg)   BMI 21.97 kg/m   Filed Weights   04/23/17 1009  Weight: 136 lb 2 oz (61.7 kg)    GENERAL: Well-nourished well-developed; Alert, no distress and comfortable. Accompanied by her family. EYES: no pallor or icterus OROPHARYNX: no thrush or ulceration; good dentition  NECK: supple, no masses felt LYMPH:  no palpable lymphadenopathy in the cervical, axillary or inguinal regions LUNGS: clear to auscultation and  No wheeze or crackles HEART/CVS: regular rate & rhythm and no murmurs; No lower extremity edema ABDOMEN:abdomen soft, non-tender and normal bowel sounds Musculoskeletal:no cyanosis of digits and no clubbing  PSYCH: alert & oriented x 3; flat  affect NEURO: no focal motor/sensory deficits SKIN:  no rashes or significant lesions  LABORATORY DATA:  I have reviewed the data as listed    Component Value Date/Time   NA 137 04/23/2017 0945   NA 140 03/08/2017 1223   NA 144 07/25/2014 0444   K 4.0 04/23/2017 0945   K 3.7 07/25/2014 0444   CL 101 04/23/2017 0945   CL 107 07/25/2014 0444   CO2 27 04/23/2017 0945   CO2 30 07/25/2014 0444   GLUCOSE 85 04/23/2017 0945   GLUCOSE 87 07/25/2014 0444   BUN 14 04/23/2017 0945   BUN 13 03/08/2017 1223   BUN 8 07/25/2014 0444   CREATININE 0.72 04/23/2017 0945   CREATININE 0.69 07/25/2014 0444   CALCIUM 8.8 (L) 04/23/2017 0945   CALCIUM 8.4 (L) 07/25/2014 0444   PROT 7.3 04/23/2017 0945   PROT 6.8 03/08/2017 1223   PROT 6.5 07/25/2014 0444   ALBUMIN 3.2 (L) 04/23/2017 0945   ALBUMIN 4.2 03/08/2017 1223   ALBUMIN 2.6 (L) 07/25/2014 0444   AST 17 04/23/2017 0945   AST 96 (H) 07/25/2014 0444   ALT 9 (L) 04/23/2017 0945   ALT 123 (H) 07/25/2014 0444   ALKPHOS 100 04/23/2017 0945   ALKPHOS 189 (H) 07/25/2014 0444   BILITOT 0.3 04/23/2017 0945   BILITOT 0.2  03/08/2017 1223   BILITOT 2.0 (H) 07/25/2014 0444   GFRNONAA >60 04/23/2017 0945   GFRNONAA >60 07/25/2014 0444   GFRAA >60 04/23/2017 0945   GFRAA >60 07/25/2014 0444    No results found for: SPEP, UPEP  Lab Results  Component Value Date   WBC 6.6 04/23/2017   NEUTROABS 5.2 04/23/2017   HGB 10.2 (L) 04/23/2017   HCT 30.3 (L) 04/23/2017   MCV 83.4 04/23/2017   PLT 381 04/23/2017      Chemistry      Component Value Date/Time   NA 137 04/23/2017 0945   NA 140 03/08/2017 1223   NA 144 07/25/2014 0444   K 4.0 04/23/2017 0945   K 3.7 07/25/2014 0444   CL 101 04/23/2017 0945   CL 107 07/25/2014 0444   CO2 27 04/23/2017 0945   CO2 30 07/25/2014 0444   BUN 14 04/23/2017 0945   BUN 13 03/08/2017 1223   BUN 8 07/25/2014 0444   CREATININE 0.72 04/23/2017 0945   CREATININE 0.69 07/25/2014 0444      Component Value Date/Time   CALCIUM 8.8 (L) 04/23/2017 0945   CALCIUM 8.4 (L) 07/25/2014 0444   ALKPHOS 100 04/23/2017 0945   ALKPHOS 189 (H) 07/25/2014 0444   AST 17 04/23/2017 0945   AST 96 (H) 07/25/2014 0444   ALT 9 (L) 04/23/2017 0945   ALT 123 (H) 07/25/2014 0444   BILITOT 0.3 04/23/2017 0945   BILITOT 0.2 03/08/2017 1223   BILITOT 2.0 (H) 07/25/2014 0444     IMPRESSION: 1. Ill-defined circumferential wall thickening in the distal rectum with perirectal edema/inflammation. The soft tissue nodule contiguous with the right aspect of the rectum on the prior study, likely representing direct distention, has decreased in the interval. Small perirectal lymph nodes persist. 2. No evidence for metastatic disease elsewhere in the abdomen or pelvis   Electronically Signed   By: Misty Stanley M.D.   On: 11/15/2016 17:23  RADIOGRAPHIC STUDIES: I have personally reviewed the radiological images as listed and agreed with the findings in the report. No results found.   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 Clear margins.  # I discussed the need for adjuvant chemotherapy FOLFOX every 2 weeks total of 10 treatments. Discussed that the goal of treatment being cure.   # Discussed the potential side effects including but not limited to-increasing fatigue, nausea vomiting, diarrhea, hair loss, sores in the mouth, increase risk of infection and also neuropathy.   # Hypokalemia- 2.8; today K- 4.0; continue 15mq/day.   # Depression- recommend continued Effexor.  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 However hold it given The need for systemic adjuvant therapy. Also discussed with Dr.sankar.  # FOLFOX chemo 1 week; labs in 2 weeks; follow up X- MD/chemo- in 3 weeks; see me chemo/labs July 2nd [pt vacation].    Orders Placed This Encounter  Procedures  . Comprehensive metabolic panel    Standing Status:   Standing    Number of Occurrences:   10    Standing Expiration Date:   04/23/2018  . CBC with Differential/Platelet    Standing Status:   Standing    Number of Occurrences:   10    Standing Expiration Date:   04/23/2018       GCammie Sickle MD 04/24/2017 4:42 PM

## 2017-04-23 NOTE — Assessment & Plan Note (Addendum)
#   Rectal  Cancer- stage III- s/p neoadjuvant chemoradiation with 5-FU continuous infusion with radiation; s/p APR April 7th 2018;  Partial response post-chemoradiation. Clear margins.  # I discussed the need for adjuvant chemotherapy FOLFOX every 2 weeks total of 10 treatments. Discussed that the goal of treatment being cure.   # Discussed the potential side effects including but not limited to-increasing fatigue, nausea vomiting, diarrhea, hair loss, sores in the mouth, increase risk of infection and also neuropathy.   # Hypokalemia- 2.8; today K- 4.0; continue 51meq/day.   # Depression- recommend continued Effexor.  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 However hold it given The need for systemic adjuvant therapy. Also discussed with Dr.sankar.  # FOLFOX chemo 1 week; labs in 2 weeks; follow up X- MD/chemo- in 3 weeks; see me chemo/labs July 2nd [pt vacation].

## 2017-04-23 NOTE — Progress Notes (Signed)
Patient here today for follow up.  Patient states no new concerns today  

## 2017-04-25 ENCOUNTER — Other Ambulatory Visit: Payer: Self-pay | Admitting: Internal Medicine

## 2017-04-25 ENCOUNTER — Encounter: Payer: Self-pay | Admitting: Radiation Oncology

## 2017-04-25 ENCOUNTER — Ambulatory Visit
Admission: RE | Admit: 2017-04-25 | Discharge: 2017-04-25 | Disposition: A | Discharge status: Home / Self Care | Payer: Medicaid Other | Source: Ambulatory Visit | Attending: Radiation Oncology | Admitting: Radiation Oncology

## 2017-04-25 ENCOUNTER — Telehealth: Payer: Self-pay | Admitting: General Surgery

## 2017-04-25 VITALS — BP 121/86 | HR 82 | Temp 98.0°F | Resp 18 | Wt 137.9 lb

## 2017-04-25 DIAGNOSIS — Z9049 Acquired absence of other specified parts of digestive tract: Secondary | ICD-10-CM | POA: Insufficient documentation

## 2017-04-25 DIAGNOSIS — Z9221 Personal history of antineoplastic chemotherapy: Secondary | ICD-10-CM | POA: Diagnosis not present

## 2017-04-25 DIAGNOSIS — Z933 Colostomy status: Secondary | ICD-10-CM | POA: Diagnosis not present

## 2017-04-25 DIAGNOSIS — C20 Malignant neoplasm of rectum: Secondary | ICD-10-CM | POA: Insufficient documentation

## 2017-04-25 DIAGNOSIS — Z923 Personal history of irradiation: Secondary | ICD-10-CM | POA: Insufficient documentation

## 2017-04-25 NOTE — Telephone Encounter (Signed)
LAURA(DAUGHTER)CALLED IN WANTING COLOSTOMY SUPPLIES FOR PATIENT. I SPOKE TO [place] [employer]EN[employer] IN THE REQUEST & WE SIGN OFF ON IT.THEY HAVE HAD TO CHANGE WHERE THE GET THE SUPPLIES BECAUSE [employer] NO LONGER TAKES [place] MEDICAID.THEY ARE NOW WORKING WITH [facility].

## 2017-04-25 NOTE — Progress Notes (Signed)
Radiation Oncology Follow up Note  Name: Rachel [name]   Date:   04/25/2017 MRN:  [id number] DOB: 03-15-1956    This 61 y.o. female presents to the clinic today for 4 month follow-up status post concurrent chemoradiation and then AP resection for rectal cancer stage III.  REFERRING PROVIDER: Lynnell Jude, MD  HPI: Patient is a 61 year old female now out 4 months having completed chemoradiation in a neoadjuvant setting for rectal cancer prior to AP resection.. She underwent AP resection showing moderately differentiated adenocarcinoma 2 cm with resections margins negative there was perineural and lymphovascular invasion identified no tumor seen in 5 lymph nodes examined. Incidentally there was a well-differentiated neuroendocrine tumor 1 mm removed from her appendix. She's currently on consolidative FOLFOX chemotherapy tolerating that well. 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  COMPLICATIONS OF TREATMENT: none  FOLLOW UP COMPLIANCE: keeps appointments   PHYSICAL EXAM:  BP 121/86   Pulse 82   Temp 98 F (36.7 C)   Resp 18   Wt 137 lb 14.4 oz (62.5 kg)   BMI 22.26 kg/m  Patient is a functioning colostomy. Well-developed well-nourished patient in NAD. HEENT reveals PERLA, EOMI, discs not visualized.  Oral cavity is clear. No oral mucosal lesions are identified. Neck is clear without evidence of cervical or supraclavicular adenopathy. Lungs are clear to A&P. Cardiac examination is essentially unremarkable with regular rate and rhythm without murmur rub or thrill. Abdomen is benign with no organomegaly or masses noted. Motor sensory and DTR levels are equal and symmetric in the upper and lower extremities. Cranial nerves II through XII are grossly intact. Proprioception is intact. No peripheral adenopathy or edema is identified. No motor or sensory levels are noted. Crude visual fields are within normal range.  RADIOLOGY RESULTS: No current films for review  PLAN: Present time she is doing  well currently on consolidative FOLFOX chemotherapy under medical oncology's direction. I will last another 5 months I've asked to see her back in 6 months for follow-up. Patient knows to call with any concerns. Seems to of had an excellent response to new adjuvant treatment.  I would like to take this opportunity to thank you for allowing me to participate in the care of your patient.Armstead Peaks., MD

## 2017-05-01 ENCOUNTER — Inpatient Hospital Stay: Payer: Medicaid Other

## 2017-05-01 VITALS — BP 115/78 | HR 80 | Temp 98.1°F | Resp 18

## 2017-05-01 DIAGNOSIS — C20 Malignant neoplasm of rectum: Secondary | ICD-10-CM

## 2017-05-01 DIAGNOSIS — C775 Secondary and unspecified malignant neoplasm of intrapelvic lymph nodes: Principal | ICD-10-CM

## 2017-05-01 DIAGNOSIS — Z5111 Encounter for antineoplastic chemotherapy: Secondary | ICD-10-CM | POA: Diagnosis not present

## 2017-05-01 MED ORDER — SODIUM CHLORIDE 0.9 % IV SOLN
2400.0000 mg/m2 | INTRAVENOUS | Status: DC
  Administered 2017-05-01: 4100 mg via INTRAVENOUS
  Filled 2017-05-01: qty 82

## 2017-05-01 MED ORDER — FLUOROURACIL CHEMO INJECTION 2.5 GM/50ML
400.0000 mg/m2 | Freq: Once | INTRAVENOUS | Status: AC
  Administered 2017-05-01: 700 mg via INTRAVENOUS
  Filled 2017-05-01: qty 14

## 2017-05-01 MED ORDER — DEXAMETHASONE SODIUM PHOSPHATE 10 MG/ML IJ SOLN
10.0000 mg | Freq: Once | INTRAMUSCULAR | Status: AC
  Administered 2017-05-01: 10 mg via INTRAVENOUS

## 2017-05-01 MED ORDER — PALONOSETRON HCL INJECTION 0.25 MG/5ML
0.2500 mg | Freq: Once | INTRAVENOUS | Status: AC
  Administered 2017-05-01: 0.25 mg via INTRAVENOUS

## 2017-05-01 MED ORDER — LEUCOVORIN CALCIUM INJECTION 350 MG
700.0000 mg | Freq: Once | INTRAVENOUS | Status: AC
  Administered 2017-05-01: 700 mg via INTRAVENOUS
  Filled 2017-05-01: qty 25

## 2017-05-01 MED ORDER — OXALIPLATIN CHEMO INJECTION 100 MG/20ML
85.0000 mg/m2 | Freq: Once | INTRAVENOUS | Status: AC
  Administered 2017-05-01: 145 mg via INTRAVENOUS
  Filled 2017-05-01: qty 20

## 2017-05-01 MED ORDER — SODIUM CHLORIDE 0.9 % IV SOLN: 10.0000 mg | Freq: Once | INTRAVENOUS | Status: DC

## 2017-05-01 MED ORDER — DEXTROSE 5 % IV SOLN
Freq: Once | INTRAVENOUS | Status: AC
  Administered 2017-05-01: 10:00:00 via INTRAVENOUS
  Filled 2017-05-01: qty 1000

## 2017-05-03 ENCOUNTER — Inpatient Hospital Stay: Payer: Medicaid Other

## 2017-05-03 VITALS — BP 133/81 | HR 76 | Temp 97.9°F | Resp 16

## 2017-05-03 DIAGNOSIS — C20 Malignant neoplasm of rectum: Secondary | ICD-10-CM

## 2017-05-03 DIAGNOSIS — Z5111 Encounter for antineoplastic chemotherapy: Secondary | ICD-10-CM | POA: Diagnosis not present

## 2017-05-03 DIAGNOSIS — C775 Secondary and unspecified malignant neoplasm of intrapelvic lymph nodes: Principal | ICD-10-CM

## 2017-05-03 MED ORDER — HEPARIN SOD (PORK) LOCK FLUSH 100 UNIT/ML IV SOLN
500.0000 [IU] | Freq: Once | INTRAVENOUS | Status: AC | PRN
  Administered 2017-05-03: 500 [IU]
  Filled 2017-05-03: qty 5

## 2017-05-08 ENCOUNTER — Inpatient Hospital Stay: Payer: Medicaid Other

## 2017-05-08 DIAGNOSIS — C775 Secondary and unspecified malignant neoplasm of intrapelvic lymph nodes: Principal | ICD-10-CM

## 2017-05-08 DIAGNOSIS — C20 Malignant neoplasm of rectum: Secondary | ICD-10-CM

## 2017-05-08 DIAGNOSIS — Z5111 Encounter for antineoplastic chemotherapy: Secondary | ICD-10-CM | POA: Diagnosis not present

## 2017-05-08 LAB — COMPREHENSIVE METABOLIC PANEL
ALBUMIN: 3.4 g/dL — AB (ref 3.5–5.0)
ALK PHOS: 87 U/L (ref 38–126)
ALT: 8 U/L — AB (ref 14–54)
AST: 16 U/L (ref 15–41)
Anion gap: 7 (ref 5–15)
BUN: 12 mg/dL (ref 6–20)
CALCIUM: 9.1 mg/dL (ref 8.9–10.3)
CHLORIDE: 101 mmol/L (ref 101–111)
CO2: 29 mmol/L (ref 22–32)
CREATININE: 0.63 mg/dL (ref 0.44–1.00)
GFR calc Af Amer: >60 mL/min (ref >60)
GFR calc non Af Amer: >60 mL/min (ref >60)
GLUCOSE: 111 mg/dL — AB (ref 65–99)
Potassium: 3.6 mmol/L (ref 3.5–5.1)
SODIUM: 137 mmol/L (ref 135–145)
Total Bilirubin: 0.3 mg/dL (ref 0.3–1.2)
Total Protein: 7 g/dL (ref 6.5–8.1)

## 2017-05-08 LAB — CBC WITH DIFFERENTIAL/PLATELET
BASOS PCT: 1 %
Basophils Absolute: 0 10*3/uL (ref 0–0.1)
EOS ABS: 0.1 10*3/uL (ref 0–0.7)
Eosinophils Relative: 2 %
HCT: 31.8 % — ABNORMAL LOW (ref 35.0–47.0)
HEMOGLOBIN: 10.5 g/dL — AB (ref 12.0–16.0)
LYMPHS ABS: 0.8 10*3/uL — AB (ref 1.0–3.6)
Lymphocytes Relative: 22 %
MCH: 27.5 pg (ref 26.0–34.0)
MCHC: 33.1 g/dL (ref 32.0–36.0)
MCV: 83.1 fL (ref 80.0–100.0)
MONO ABS: 0.3 10*3/uL (ref 0.2–0.9)
MONOS PCT: 8 %
NEUTROS PCT: 67 %
Neutro Abs: 2.4 10*3/uL (ref 1.4–6.5)
Platelets: 201 10*3/uL (ref 150–440)
RBC: 3.82 MIL/uL (ref 3.80–5.20)
RDW: 16.3 % — AB (ref 11.5–14.5)
WBC: 3.6 10*3/uL (ref 3.6–11.0)

## 2017-05-11 ENCOUNTER — Other Ambulatory Visit: Payer: Self-pay | Admitting: *Deleted

## 2017-05-11 DIAGNOSIS — C775 Secondary and unspecified malignant neoplasm of intrapelvic lymph nodes: Principal | ICD-10-CM

## 2017-05-11 DIAGNOSIS — C20 Malignant neoplasm of rectum: Secondary | ICD-10-CM

## 2017-05-14 ENCOUNTER — Inpatient Hospital Stay (HOSPITAL_BASED_OUTPATIENT_CLINIC_OR_DEPARTMENT_OTHER): Payer: Medicaid Other | Admitting: Oncology

## 2017-05-14 ENCOUNTER — Inpatient Hospital Stay: Payer: Medicaid Other

## 2017-05-14 ENCOUNTER — Inpatient Hospital Stay: Payer: Medicaid Other | Attending: Oncology

## 2017-05-14 VITALS — BP 136/89 | HR 68 | Temp 97.1°F | Ht 66.0 in | Wt 138.2 lb

## 2017-05-14 DIAGNOSIS — C20 Malignant neoplasm of rectum: Secondary | ICD-10-CM | POA: Insufficient documentation

## 2017-05-14 DIAGNOSIS — F329 Major depressive disorder, single episode, unspecified: Secondary | ICD-10-CM | POA: Diagnosis not present

## 2017-05-14 DIAGNOSIS — E041 Nontoxic single thyroid nodule: Secondary | ICD-10-CM | POA: Diagnosis not present

## 2017-05-14 DIAGNOSIS — F17210 Nicotine dependence, cigarettes, uncomplicated: Secondary | ICD-10-CM | POA: Diagnosis not present

## 2017-05-14 DIAGNOSIS — D34 Benign neoplasm of thyroid gland: Secondary | ICD-10-CM | POA: Insufficient documentation

## 2017-05-14 DIAGNOSIS — Z5111 Encounter for antineoplastic chemotherapy: Secondary | ICD-10-CM | POA: Insufficient documentation

## 2017-05-14 DIAGNOSIS — Z87891 Personal history of nicotine dependence: Secondary | ICD-10-CM | POA: Diagnosis not present

## 2017-05-14 DIAGNOSIS — Z79899 Other long term (current) drug therapy: Secondary | ICD-10-CM | POA: Diagnosis not present

## 2017-05-14 DIAGNOSIS — D649 Anemia, unspecified: Secondary | ICD-10-CM

## 2017-05-14 DIAGNOSIS — Z8673 Personal history of transient ischemic attack (TIA), and cerebral infarction without residual deficits: Secondary | ICD-10-CM | POA: Diagnosis not present

## 2017-05-14 DIAGNOSIS — C775 Secondary and unspecified malignant neoplasm of intrapelvic lymph nodes: Secondary | ICD-10-CM | POA: Insufficient documentation

## 2017-05-14 DIAGNOSIS — I10 Essential (primary) hypertension: Secondary | ICD-10-CM | POA: Diagnosis not present

## 2017-05-14 DIAGNOSIS — I2510 Atherosclerotic heart disease of native coronary artery without angina pectoris: Secondary | ICD-10-CM | POA: Diagnosis not present

## 2017-05-14 LAB — CBC WITH DIFFERENTIAL/PLATELET
Basophils Absolute: 0 10*3/uL (ref 0–0.1)
Basophils Relative: 1 %
Eosinophils Absolute: 0.1 10*3/uL (ref 0–0.7)
Eosinophils Relative: 2 %
HCT: 30.7 % — ABNORMAL LOW (ref 35.0–47.0)
HEMOGLOBIN: 10.3 g/dL — AB (ref 12.0–16.0)
LYMPHS ABS: 0.8 10*3/uL — AB (ref 1.0–3.6)
LYMPHS PCT: 16 %
MCH: 27.9 pg (ref 26.0–34.0)
MCHC: 33.7 g/dL (ref 32.0–36.0)
MCV: 82.9 fL (ref 80.0–100.0)
MONOS PCT: 11 %
Monocytes Absolute: 0.5 10*3/uL (ref 0.2–0.9)
NEUTROS PCT: 70 %
Neutro Abs: 3.5 10*3/uL (ref 1.4–6.5)
Platelets: 183 10*3/uL (ref 150–440)
RBC: 3.7 MIL/uL — AB (ref 3.80–5.20)
RDW: 16.9 % — ABNORMAL HIGH (ref 11.5–14.5)
WBC: 5 10*3/uL (ref 3.6–11.0)

## 2017-05-14 LAB — COMPREHENSIVE METABOLIC PANEL
ALK PHOS: 96 U/L (ref 38–126)
ALT: 11 U/L — AB (ref 14–54)
AST: 22 U/L (ref 15–41)
Albumin: 3.3 g/dL — ABNORMAL LOW (ref 3.5–5.0)
Anion gap: 6 (ref 5–15)
BILIRUBIN TOTAL: 0.3 mg/dL (ref 0.3–1.2)
BUN: 10 mg/dL (ref 6–20)
CALCIUM: 8.8 mg/dL — AB (ref 8.9–10.3)
CO2: 27 mmol/L (ref 22–32)
CREATININE: 0.77 mg/dL (ref 0.44–1.00)
Chloride: 104 mmol/L (ref 101–111)
Glucose, Bld: 108 mg/dL — ABNORMAL HIGH (ref 65–99)
Potassium: 3.6 mmol/L (ref 3.5–5.1)
Sodium: 137 mmol/L (ref 135–145)
TOTAL PROTEIN: 6.8 g/dL (ref 6.5–8.1)

## 2017-05-14 MED ORDER — SODIUM CHLORIDE 0.9 % IV SOLN
2400.0000 mg/m2 | INTRAVENOUS | Status: DC
  Administered 2017-05-14: 4100 mg via INTRAVENOUS
  Filled 2017-05-14: qty 82

## 2017-05-14 MED ORDER — OXALIPLATIN CHEMO INJECTION 100 MG/20ML
85.0000 mg/m2 | Freq: Once | INTRAVENOUS | Status: AC
  Administered 2017-05-14: 145 mg via INTRAVENOUS
  Filled 2017-05-14: qty 20

## 2017-05-14 MED ORDER — SODIUM CHLORIDE 0.9% FLUSH
10.0000 mL | INTRAVENOUS | Status: DC | PRN
  Administered 2017-05-14: 10 mL
  Filled 2017-05-14: qty 10

## 2017-05-14 MED ORDER — LEUCOVORIN CALCIUM INJECTION 350 MG
700.0000 mg | Freq: Once | INTRAVENOUS | Status: AC
  Administered 2017-05-14: 700 mg via INTRAVENOUS
  Filled 2017-05-14: qty 35

## 2017-05-14 MED ORDER — DEXTROSE 5 % IV SOLN
Freq: Once | INTRAVENOUS | Status: AC
  Administered 2017-05-14: 12:00:00 via INTRAVENOUS
  Filled 2017-05-14: qty 1000

## 2017-05-14 MED ORDER — DEXAMETHASONE SODIUM PHOSPHATE 10 MG/ML IJ SOLN
10.0000 mg | Freq: Once | INTRAMUSCULAR | Status: AC
  Administered 2017-05-14: 10 mg via INTRAVENOUS
  Filled 2017-05-14: qty 1

## 2017-05-14 MED ORDER — PALONOSETRON HCL INJECTION 0.25 MG/5ML
0.2500 mg | Freq: Once | INTRAVENOUS | Status: AC
  Administered 2017-05-14: 0.25 mg via INTRAVENOUS
  Filled 2017-05-14: qty 5

## 2017-05-14 MED ORDER — FLUOROURACIL CHEMO INJECTION 2.5 GM/50ML
400.0000 mg/m2 | Freq: Once | INTRAVENOUS | Status: AC
  Administered 2017-05-14: 700 mg via INTRAVENOUS
  Filled 2017-05-14: qty 14

## 2017-05-14 NOTE — Progress Notes (Signed)
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 She tolerated her first infusion well without significant side effects. She currently feels well and is asymptomatic. She has no neurological point. She denies any recent fevers. She is good appetite and denies weight loss. She has no chest pain or shortness of breath. She denies any abdominal pain. She has no nausea, vomiting, constipation, or diarrhea. She has no urinary complaints. Patient offers no specific complaints today.   REVIEW OF SYSTEMS:   Review of Systems  Constitutional: Negative.  Negative for fever, malaise/fatigue and weight loss.  Respiratory: Negative.  Negative for cough and shortness of breath.   Cardiovascular: Negative.  Negative for chest pain and leg swelling.  Gastrointestinal: Negative.  Negative for abdominal pain, diarrhea and nausea.  Genitourinary: Negative.   Musculoskeletal: Negative.   Skin: Negative.  Negative for rash.  Neurological: Negative.  Negative for weakness.  Psychiatric/Behavioral: Negative.  The patient is not nervous/anxious.     As per HPI. Otherwise, a complete review of systems is negative.  PAST MEDICAL HISTORY: Past Medical History:  Diagnosis Date  . Arthritis   . Body mass index (BMI) of 24.0-24.9 in adult   . Cardiovascular disease   . Current every day smoker    a. ~35 years - > has cut down to < 3/4 ppd  . Headache   . History of UTI   . Hypertension   . Midsternal chest pain    a. 05/2014 Stress Echo: Ex time: 8:09, ECG w/o acute changes, EF nl with possible mid and apical  anterior HK-->cath recommended.  . Port-a-cath in place    RIGHT SIDE  . Rectal cancer ([place]) 2017  . Stroke ([place])    07/2016 tia no neurology followup now seen by pcp  . Syncope and collapse   . Thyroid nodule 2018   FINDINGS CONSISTENT WITH A HURTHLE CELL LESION AND/OR NEOPLASM (BETHESDA CATEGORY IV).    PAST SURGICAL HISTORY: Past Surgical History:  Procedure Laterality Date  . ABDOMINAL HYSTERECTOMY    . ABDOMINAL PERINEAL BOWEL RESECTION N/A 03/16/2017   Procedure: ABDOMINAL PERINEAL RESECTION;  Surgeon: Christene Lye, MD;  Location: [facility] ORS;  Service: General;  Laterality: N/A;  . APPENDECTOMY  03/16/2017   Procedure: APPENDECTOMY;  Surgeon: Christene Lye, MD;  Location: [facility] ORS;  Service: General;;  . BACK SURGERY     l 3,4,5  . BIOPSY THYROID Right 11/16/2016   FINDINGS CONSISTENT WITH A HURTHLE CELL LESION AND/OR NEOPLASM (BETHESDA CATEGORY IV).  [name] CHOLECYSTECTOMY    . COLONOSCOPY WITH PROPOFOL N/A 08/15/2016   Procedure: COLONOSCOPY WITH PROPOFOL;  Surgeon: Lollie Sails, MD;  Location: [facility] ENDOSCOPY;  Service: Endoscopy;  Laterality: N/A;  . COLONOSCOPY WITH PROPOFOL N/A 11/08/2016   Procedure: COLONOSCOPY WITH PROPOFOL;  Surgeon: Christene Lye, MD;  Location: [facility] ENDOSCOPY;  Service: Endoscopy;  Laterality: N/A;  . COLOSTOMY N/A 11/23/2016   Procedure: SIGMOID COLOSTOMY;  Surgeon: Christene Lye, MD;  Location: [facility] ORS;  Service: General;  Laterality: N/A;  . ERCP    . LAPAROSCOPY N/A 11/23/2016   Procedure: LAPAROSCOPY DIAGNOSTIC;  Surgeon: Andreas Newport  Jamal Collin, MD;  Location: [facility] ORS;  Service: General;  Laterality: N/A;  . PARTIAL HYSTERECTOMY    . PORTACATH PLACEMENT N/A 08/18/2016   Procedure: INSERTION PORT-A-CATH;  Surgeon: Christene Lye, MD;  Location: [facility] ORS;  Service: General;  Laterality: N/A;    FAMILY HISTORY: Family History  Problem Relation Age of Onset  . Breast cancer Mother   . Hyperlipidemia Father     . Heart disease Paternal Grandmother   . Stroke Maternal Grandfather     ADVANCED DIRECTIVES (Y/N):  N  HEALTH MAINTENANCE: Social History  Substance Use Topics  . Smoking status: Former Smoker    Packs/day: 0.25    Years: 35.00    Types: Cigarettes    Quit date: 08/11/2016  . Smokeless tobacco: Never Used  . Alcohol use No     Comment: occasional     Colonoscopy:  PAP:  Bone density:  Lipid panel:  Allergies  Allergen Reactions  . Prednisone Other (See Comments)    BLISTERS IN MOUTH AND ON TONGUE    Current Outpatient Prescriptions  Medication Sig Dispense Refill  . clopidogrel (PLAVIX) 75 MG tablet Take 75 mg by mouth every evening.     . diphenoxylate-atropine (LOMOTIL) 2.5-0.025 MG tablet Take 1 tablet by mouth 4 (four) times daily as needed for diarrhea or loose stools. Take it along with immodium 30 tablet 0  . lisinopril (PRINIVIL,ZESTRIL) 10 MG tablet Take 5 mg by mouth every morning.     . nitroGLYCERIN (NITROSTAT) 0.4 MG SL tablet Place 1 tablet (0.4 mg total) under the tongue every 5 (five) minutes as needed for chest pain. 25 tablet 6  . oxyCODONE (OXY IR/ROXICODONE) 5 MG immediate release tablet Take 1-2 tablets (5-10 mg total) by mouth every 4 (four) hours as needed for moderate pain. 30 tablet 0  . potassium chloride SA (K-DUR,KLOR-CON) 20 MEQ tablet TAKE ONE TABLET BY MOUTH TWICE DAILY 60 tablet 2  . traMADol (ULTRAM) 50 MG tablet Take 50 mg by mouth every 6 (six) hours as needed (for pain.).     [name] venlafaxine XR (EFFEXOR-XR) 37.5 MG 24 hr capsule TAKE ONE CAPSULE BY MOUTH EVERY MORNING WITH BREAKFAST 30 capsule 0   No current facility-administered medications for this visit.     OBJECTIVE: Vitals:   05/14/17 1118  BP: 136/89  Pulse: 68  Temp: 97.1 F (36.2 C)     Body mass index is 22.3 kg/m.    ECOG FS:0 - Asymptomatic  General: Well-developed, well-nourished, no acute distress. Eyes: Pink conjunctiva, anicteric sclera. Lungs: Clear to  auscultation bilaterally. Heart: Regular rate and rhythm. No rubs, murmurs, or gallops. Abdomen: Soft, nontender, nondistended. No organomegaly noted, normoactive bowel sounds. Musculoskeletal: No edema, cyanosis, or clubbing. Neuro: Alert, answering all questions appropriately. Cranial nerves grossly intact. Skin: No rashes or petechiae noted. Psych: Normal affect.  LAB RESULTS:  Lab Results  Component Value Date   NA 137 05/14/2017   K 3.6 05/14/2017   CL 104 05/14/2017   CO2 27 05/14/2017   GLUCOSE 108 (H) 05/14/2017   BUN 10 05/14/2017   CREATININE 0.77 05/14/2017   CALCIUM 8.8 (L) 05/14/2017   PROT 6.8 05/14/2017   ALBUMIN 3.3 (L) 05/14/2017   AST 22 05/14/2017   ALT 11 (L) 05/14/2017   ALKPHOS 96 05/14/2017   BILITOT 0.3 05/14/2017   GFRNONAA >60 05/14/2017   GFRAA >60 05/14/2017    Lab Results  Component Value Date   WBC 5.0 05/14/2017   NEUTROABS 3.5  05/14/2017   HGB 10.3 (L) 05/14/2017   HCT 30.7 (L) 05/14/2017   MCV 82.9 05/14/2017   PLT 183 05/14/2017   Lab Results  Component Value Date   CEA 2.8 04/06/2017     STUDIES: No results found.  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 Clear margins.  #Proceed with cycle 2 of adjuvant FOLFOX. Return to clinic in 2 weeks for consideration of cycle 3.   # Previously discussed the potential side effects including but not limited to-increasing fatigue, nausea vomiting, diarrhea, hair loss, sores in the mouth, increase risk of infection and also neuropathy.   # Hypokalemia- resolved. Continue oral supplementation.  # Depression- recommend continued Effexor.  # Anemia- mild, monitor.  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  However hold it given The need for systemic adjuvant therapy.  # Return to clinic on June 11, 2017 as previously scheduled for further evaluation and continuation of treatment. Cramping delayed secondary to a preplanned patient vacation.  Patient expressed understanding and was in agreement with this plan. She also understands that She can call clinic at any time with any questions, concerns, or complaints.    Lloyd Huger, MD   05/19/2017 6:30 PM

## 2017-05-14 NOTE — Progress Notes (Signed)
Patient here today for follow up.  Patient c/o not being able to sleep, advised pt to try OTC melatonin.

## 2017-05-16 ENCOUNTER — Inpatient Hospital Stay: Payer: Medicaid Other

## 2017-05-16 DIAGNOSIS — C20 Malignant neoplasm of rectum: Secondary | ICD-10-CM

## 2017-05-16 DIAGNOSIS — C775 Secondary and unspecified malignant neoplasm of intrapelvic lymph nodes: Principal | ICD-10-CM

## 2017-05-16 DIAGNOSIS — Z5111 Encounter for antineoplastic chemotherapy: Secondary | ICD-10-CM | POA: Diagnosis not present

## 2017-05-16 MED ORDER — HEPARIN SOD (PORK) LOCK FLUSH 100 UNIT/ML IV SOLN
500.0000 [IU] | Freq: Once | INTRAVENOUS | Status: AC | PRN
  Administered 2017-05-16: 500 [IU]

## 2017-05-16 MED ORDER — HEPARIN SOD (PORK) LOCK FLUSH 100 UNIT/ML IV SOLN
INTRAVENOUS | Status: AC
  Filled 2017-05-16: qty 5

## 2017-05-16 MED ORDER — SODIUM CHLORIDE 0.9% FLUSH
10.0000 mL | INTRAVENOUS | Status: DC | PRN
  Administered 2017-05-16: 10 mL
  Filled 2017-05-16: qty 10

## 2017-05-21 ENCOUNTER — Encounter: Payer: Self-pay | Admitting: General Surgery

## 2017-05-28 ENCOUNTER — Ambulatory Visit (INDEPENDENT_AMBULATORY_CARE_PROVIDER_SITE_OTHER): Payer: Medicaid Other | Admitting: General Surgery

## 2017-05-28 ENCOUNTER — Encounter: Payer: Self-pay | Admitting: General Surgery

## 2017-05-28 VITALS — BP 126/80 | HR 74 | Resp 12 | Ht 66.0 in | Wt 137.0 lb

## 2017-05-28 DIAGNOSIS — C20 Malignant neoplasm of rectum: Secondary | ICD-10-CM

## 2017-05-28 DIAGNOSIS — D34 Benign neoplasm of thyroid gland: Secondary | ICD-10-CM

## 2017-05-28 NOTE — Patient Instructions (Addendum)
Patient to return in three months.  

## 2017-05-28 NOTE — Progress Notes (Signed)
Patient ID: Rachel [name], female   DOB: 1956/10/02, 61 y.o.   MRN: [id number]  Chief Complaint  Patient presents with  . Routine Post Op    HPI [name] is a 61 y.o. female Here for her follow up abdomino-perineal resection done on 03/16/2017. She reports she is doing fine, But she is still draining and there is a smell.  HPI  Past Medical History:  Diagnosis Date  . Arthritis   . Body mass index (BMI) of 24.0-24.9 in adult   . Cardiovascular disease   . Current every day smoker    a. ~35 years - > has cut down to < 3/4 ppd  . Headache   . History of UTI   . Hypertension   . Midsternal chest pain    a. 05/2014 Stress Echo: Ex time: 8:09, ECG w/o acute changes, EF nl with possible mid and apical anterior HK-->cath recommended.  . Port-a-cath in place    RIGHT SIDE  . Rectal cancer ([place]) 2017  . Stroke ([place])    07/2016 tia no neurology followup now seen by pcp  . Syncope and collapse   . Thyroid nodule 2018   FINDINGS CONSISTENT WITH A HURTHLE CELL LESION AND/OR NEOPLASM (BETHESDA CATEGORY IV).    Past Surgical History:  Procedure Laterality Date  . ABDOMINAL HYSTERECTOMY    . ABDOMINAL PERINEAL BOWEL RESECTION N/A 03/16/2017   Procedure: ABDOMINAL PERINEAL RESECTION;  Surgeon: Christene Lye, MD;  Location: [facility] ORS;  Service: General;  Laterality: N/A;  . APPENDECTOMY  03/16/2017   Procedure: APPENDECTOMY;  Surgeon: Christene Lye, MD;  Location: [facility] ORS;  Service: General;;  . BACK SURGERY     l 3,4,5  . BIOPSY THYROID Right 11/16/2016   FINDINGS CONSISTENT WITH A HURTHLE CELL LESION AND/OR NEOPLASM (BETHESDA CATEGORY IV).  [name] CHOLECYSTECTOMY    . COLONOSCOPY WITH PROPOFOL N/A 08/15/2016   Procedure: COLONOSCOPY WITH PROPOFOL;  Surgeon: Lollie Sails, MD;  Location[facility] ENDOSCOPY;  Service: Endoscopy;  Laterality: N/A;  . COLONOSCOPY WITH PROPOFOL N/A 11/08/2016   Procedure: COLONOSCOPY WITH PROPOFOL;  Surgeon: Christene Lye, MD;  Location:  [facility] ENDOSCOPY;  Service: Endoscopy;  Laterality: N/A;  . COLOSTOMY N/A 11/23/2016   Procedure: SIGMOID COLOSTOMY;  Surgeon: Christene Lye, MD;  Location: [facility] ORS;  Service: General;  Laterality: N/A;  . ERCP    . LAPAROSCOPY N/A 11/23/2016   Procedure: LAPAROSCOPY DIAGNOSTIC;  Surgeon: Christene Lye, MD;  Location: [facility] ORS;  Service: General;  Laterality: N/A;  . PARTIAL HYSTERECTOMY    . PORTACATH PLACEMENT N/A 08/18/2016   Procedure: INSERTION PORT-A-CATH;  Surgeon: Christene Lye, MD;  Location: [facility] ORS;  Service: General;  Laterality: N/A;    Family History  Problem Relation Age of Onset  . Breast cancer Mother   . Hyperlipidemia Father   . Heart disease Paternal Grandmother   . Stroke Maternal Grandfather     Social History Social History  Substance Use Topics  . Smoking status: Former Smoker    Packs/day: 0.25    Years: 35.00    Types: Cigarettes    Quit date: 08/11/2016  . Smokeless tobacco: Never Used  . Alcohol use No     Comment: occasional    Allergies  Allergen Reactions  . Prednisone Other (See Comments)    BLISTERS IN MOUTH AND ON TONGUE    Current Outpatient Prescriptions  Medication Sig Dispense Refill  . clopidogrel (PLAVIX) 75 MG tablet Take 75  mg by mouth every evening.     . diphenoxylate-atropine (LOMOTIL) 2.5-0.025 MG tablet Take 1 tablet by mouth 4 (four) times daily as needed for diarrhea or loose stools. Take it along with immodium 30 tablet 0  . lisinopril (PRINIVIL,ZESTRIL) 10 MG tablet Take 5 mg by mouth every morning.     . nitroGLYCERIN (NITROSTAT) 0.4 MG SL tablet Place 1 tablet (0.4 mg total) under the tongue every 5 (five) minutes as needed for chest pain. 25 tablet 6  . oxyCODONE (OXY IR/ROXICODONE) 5 MG immediate release tablet Take 1-2 tablets (5-10 mg total) by mouth every 4 (four) hours as needed for moderate pain. 30 tablet 0  . potassium chloride SA (K-DUR,KLOR-CON) 20 MEQ tablet TAKE ONE TABLET BY MOUTH TWICE  DAILY 60 tablet 2  . traMADol (ULTRAM) 50 MG tablet Take 50 mg by mouth every 6 (six) hours as needed (for pain.).     [name] venlafaxine XR (EFFEXOR-XR) 37.5 MG 24 hr capsule TAKE ONE CAPSULE BY MOUTH EVERY MORNING WITH BREAKFAST 30 capsule 0   No current facility-administered medications for this visit.     Review of Systems Review of Systems  Constitutional: Negative.   Respiratory: Negative.   Cardiovascular: Negative.     Blood pressure 126/80, pulse 74, resp. rate 12, height 5\' 6"  (1.676 m), weight 137 lb (62.1 kg).  Physical Exam Physical Exam  Constitutional: She is oriented to person, place, and time. She appears well-developed and well-nourished.  Eyes: Conjunctivae are normal. No scleral icterus.  Neck: Neck supple.  Cardiovascular: Normal rate, regular rhythm and normal heart sounds.   Pulmonary/Chest: Effort normal and breath sounds normal.  Abdominal: Soft. Normal appearance. There is no hepatomegaly. There is no tenderness. No hernia.    Genitourinary:     Lymphadenopathy:    She has no cervical adenopathy.  Neurological: She is alert and oriented to person, place, and time.  Skin: Skin is warm.    Data Reviewed Prior notes reviewed   Assessment    Carcinoma of rectum, stage III- S/p neoadjuvant chemoradiation, AP resection. Pathology showed partial response to chemoradiation. Incision site healing appropriately, small amount of clear drainage noticed. Continue chemo with Dr. Rogue Bussing. Likely will finish in September this year.  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    Plan    Patient to return in four months.    HPI, Physical Exam, Assessment and Plan have been scribed under the direction and in the presence of Mckinley Jewel, MD  [name], CMA  I have completed the exam and reviewed the above documentation for accuracy and completeness.  I agree with the above.  Haematologist has been used and any  errors in dictation or transcription are unintentional.  Lacoya Wilbanks Rachel [name], M.D., F.A.C.S.  Junie Panning G 05/28/2017, 10:02 AM

## 2017-06-06 DIAGNOSIS — D34 Benign neoplasm of thyroid gland: Secondary | ICD-10-CM

## 2017-06-11 ENCOUNTER — Inpatient Hospital Stay: Payer: Medicaid Other | Attending: Internal Medicine | Admitting: Internal Medicine

## 2017-06-11 ENCOUNTER — Inpatient Hospital Stay: Payer: Medicaid Other

## 2017-06-11 VITALS — BP 141/96 | HR 81 | Temp 98.3°F | Resp 16 | Wt 141.1 lb

## 2017-06-11 DIAGNOSIS — C20 Malignant neoplasm of rectum: Secondary | ICD-10-CM

## 2017-06-11 DIAGNOSIS — Z8673 Personal history of transient ischemic attack (TIA), and cerebral infarction without residual deficits: Secondary | ICD-10-CM | POA: Insufficient documentation

## 2017-06-11 DIAGNOSIS — D696 Thrombocytopenia, unspecified: Secondary | ICD-10-CM | POA: Diagnosis not present

## 2017-06-11 DIAGNOSIS — I10 Essential (primary) hypertension: Secondary | ICD-10-CM | POA: Insufficient documentation

## 2017-06-11 DIAGNOSIS — I2510 Atherosclerotic heart disease of native coronary artery without angina pectoris: Secondary | ICD-10-CM | POA: Insufficient documentation

## 2017-06-11 DIAGNOSIS — Z79899 Other long term (current) drug therapy: Secondary | ICD-10-CM

## 2017-06-11 DIAGNOSIS — E041 Nontoxic single thyroid nodule: Secondary | ICD-10-CM | POA: Insufficient documentation

## 2017-06-11 DIAGNOSIS — C775 Secondary and unspecified malignant neoplasm of intrapelvic lymph nodes: Principal | ICD-10-CM

## 2017-06-11 DIAGNOSIS — Z87440 Personal history of urinary (tract) infections: Secondary | ICD-10-CM | POA: Insufficient documentation

## 2017-06-11 DIAGNOSIS — Z7902 Long term (current) use of antithrombotics/antiplatelets: Secondary | ICD-10-CM | POA: Insufficient documentation

## 2017-06-11 DIAGNOSIS — Z87891 Personal history of nicotine dependence: Secondary | ICD-10-CM | POA: Diagnosis not present

## 2017-06-11 DIAGNOSIS — Z923 Personal history of irradiation: Secondary | ICD-10-CM | POA: Diagnosis not present

## 2017-06-11 DIAGNOSIS — Z9221 Personal history of antineoplastic chemotherapy: Secondary | ICD-10-CM | POA: Diagnosis not present

## 2017-06-11 DIAGNOSIS — E876 Hypokalemia: Secondary | ICD-10-CM | POA: Diagnosis not present

## 2017-06-11 DIAGNOSIS — Z5111 Encounter for antineoplastic chemotherapy: Secondary | ICD-10-CM | POA: Insufficient documentation

## 2017-06-11 DIAGNOSIS — F329 Major depressive disorder, single episode, unspecified: Secondary | ICD-10-CM | POA: Diagnosis not present

## 2017-06-11 LAB — CBC WITH DIFFERENTIAL/PLATELET
BASOS ABS: 0 10*3/uL (ref 0–0.1)
Basophils Relative: 1 %
Eosinophils Absolute: 0.1 10*3/uL (ref 0–0.7)
Eosinophils Relative: 1 %
HCT: 35.6 % (ref 35.0–47.0)
HEMOGLOBIN: 11.8 g/dL — AB (ref 12.0–16.0)
LYMPHS ABS: 0.8 10*3/uL — AB (ref 1.0–3.6)
LYMPHS PCT: 11 %
MCH: 27.7 pg (ref 26.0–34.0)
MCHC: 33.1 g/dL (ref 32.0–36.0)
MCV: 83.7 fL (ref 80.0–100.0)
Monocytes Absolute: 0.7 10*3/uL (ref 0.2–0.9)
Monocytes Relative: 10 %
NEUTROS ABS: 5.8 10*3/uL (ref 1.4–6.5)
NEUTROS PCT: 77 %
PLATELETS: 147 10*3/uL — AB (ref 150–440)
RBC: 4.25 MIL/uL (ref 3.80–5.20)
RDW: 19.3 % — ABNORMAL HIGH (ref 11.5–14.5)
WBC: 7.5 10*3/uL (ref 3.6–11.0)

## 2017-06-11 LAB — COMPREHENSIVE METABOLIC PANEL
ALK PHOS: 127 U/L — AB (ref 38–126)
ALT: 13 U/L — ABNORMAL LOW (ref 14–54)
ANION GAP: 8 (ref 5–15)
AST: 22 U/L (ref 15–41)
Albumin: 3.7 g/dL (ref 3.5–5.0)
BILIRUBIN TOTAL: 0.3 mg/dL (ref 0.3–1.2)
BUN: 11 mg/dL (ref 6–20)
CALCIUM: 8.9 mg/dL (ref 8.9–10.3)
CO2: 27 mmol/L (ref 22–32)
Chloride: 105 mmol/L (ref 101–111)
Creatinine, Ser: 0.63 mg/dL (ref 0.44–1.00)
GFR calc non Af Amer: >60 mL/min (ref >60)
GLUCOSE: 64 mg/dL — AB (ref 65–99)
POTASSIUM: 3.3 mmol/L — AB (ref 3.5–5.1)
SODIUM: 140 mmol/L (ref 135–145)
Total Protein: 7.4 g/dL (ref 6.5–8.1)

## 2017-06-11 MED ORDER — LEUCOVORIN CALCIUM INJECTION 350 MG
700.0000 mg | Freq: Once | INTRAVENOUS | Status: AC
  Administered 2017-06-11: 700 mg via INTRAVENOUS
  Filled 2017-06-11: qty 35

## 2017-06-11 MED ORDER — SODIUM CHLORIDE 0.9% FLUSH
10.0000 mL | INTRAVENOUS | Status: DC | PRN
  Administered 2017-06-11: 10 mL via INTRAVENOUS
  Filled 2017-06-11: qty 10

## 2017-06-11 MED ORDER — HEPARIN SOD (PORK) LOCK FLUSH 100 UNIT/ML IV SOLN: 500.0000 [IU] | Freq: Once | INTRAVENOUS | Status: DC

## 2017-06-11 MED ORDER — OXALIPLATIN CHEMO INJECTION 100 MG/20ML
150.0000 mg | Freq: Once | INTRAVENOUS | Status: AC
  Administered 2017-06-11: 150 mg via INTRAVENOUS
  Filled 2017-06-11: qty 20

## 2017-06-11 MED ORDER — PALONOSETRON HCL INJECTION 0.25 MG/5ML
0.2500 mg | Freq: Once | INTRAVENOUS | Status: AC
  Administered 2017-06-11: 0.25 mg via INTRAVENOUS

## 2017-06-11 MED ORDER — FLUOROURACIL CHEMO INJECTION 2.5 GM/50ML
400.0000 mg/m2 | Freq: Once | INTRAVENOUS | Status: AC
  Administered 2017-06-11: 700 mg via INTRAVENOUS
  Filled 2017-06-11: qty 14

## 2017-06-11 MED ORDER — DEXTROSE 5 % IV SOLN
Freq: Once | INTRAVENOUS | Status: AC
  Administered 2017-06-11: 10:00:00 via INTRAVENOUS
  Filled 2017-06-11: qty 1000

## 2017-06-11 MED ORDER — SODIUM CHLORIDE 0.9 % IV SOLN
2400.0000 mg/m2 | INTRAVENOUS | Status: DC
  Administered 2017-06-11: 4100 mg via INTRAVENOUS
  Filled 2017-06-11: qty 82

## 2017-06-11 MED ORDER — DEXAMETHASONE SODIUM PHOSPHATE 10 MG/ML IJ SOLN
10.0000 mg | Freq: Once | INTRAMUSCULAR | Status: AC
  Administered 2017-06-11: 10 mg via INTRAVENOUS

## 2017-06-11 NOTE — Progress Notes (Signed)
[facility] OFFICE PROGRESS NOTE  Patient Care Team: Lynnell Jude, MD as PCP - General (Family Medicine) Christene Lye, MD (General Surgery)  Cancer Staging No matching staging information was found for the patient.   Oncology History   # SEP 2017- RECTAL CA mod diff adeno; STAGE III [no EUS; positive ~48m LN; Dr.sankar] CEA-7; Sep 21st START 5FU-RT [finished NOV 2017]; April 7th APR- 2018- [id number] [5LN]; positive for LVI/PNI [poor response to chemo]; Margins-Negative.  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  female patient with rectal cancer is Currently Status post surgery- currently undergoing adjuvant chemotherapy.  Patient is status post 2 cycles of chemotherapy so for. She is tolerating chemotherapy fairly well. Last chemotherapy was postponed for 2 weeks was on vacation.  Patient complains of mild tingling and numbness/imaging of the palms few days after the chemotherapy. Currently resolved.  Mild nausea with chemotherapy otherwise no vomiting. He is improved with antiemetics. No blood in stools or black stools. No fevers or chills. Otherwise no chest pain or shortness of breath or cough.   REVIEW OF SYSTEMS:  A complete 10 point review of system is done which is negative except mentioned above/history of present illness.   PAST MEDICAL HISTORY :  Past Medical History:  Diagnosis Date  . Arthritis   . Body mass index (BMI) of 24.0-24.9 in adult   . Cardiovascular disease   . Current every day  smoker    a. ~35 years - > has cut down to < 3/4 ppd  . Headache   . History of UTI   . Hypertension   . Midsternal chest pain    a. 05/2014 Stress Echo: Ex time: 8:09, ECG w/o acute changes, EF nl with possible mid and apical anterior HK-->cath recommended.  . Port-a-cath in place    RIGHT SIDE  . [place]  [place]    07/2016 tia no neurology followup now seen by pcp  . Syncope and collapse   . Thyroid nodule 2018   FINDINGS CONSISTENT WITH A HURTHLE CELL LESION AND/OR NEOPLASM (BETHESDA CATEGORY IV).    PAST SURGICAL HISTORY :   Past Surgical History:  Procedure Laterality Date  . ABDOMINAL HYSTERECTOMY    . ABDOMINAL PERINEAL BOWEL RESECTION N/A 03/16/2017   Procedure: ABDOMINAL PERINEAL RESECTION;  Surgeon: SChristene Lye MD;  Location: [facility] ORS;  Service: General;  Laterality: N/A;  . APPENDECTOMY  03/16/2017   Procedure: APPENDECTOMY;  Surgeon: SChristene Lye MD;  Location: [facility] ORS;  Service: General;;  . BACK SURGERY     l 3,4,5  . BIOPSY THYROID Right 11/16/2016   FINDINGS CONSISTENT WITH A HURTHLE CELL LESION AND/OR NEOPLASM (BETHESDA CATEGORY IV).  .Marland KitchenCHOLECYSTECTOMY    . COLONOSCOPY WITH PROPOFOL N/A 08/15/2016   Procedure: COLONOSCOPY WITH PROPOFOL;  Surgeon: MLollie Sails MD;  [facility];  Service: Endoscopy;  Laterality: N/A;  . COLONOSCOPY WITH PROPOFOL N/A 11/08/2016   Procedure: COLONOSCOPY WITH PROPOFOL;  Surgeon: Christene Lye, MD;  Location: [facility] ENDOSCOPY;  Service: Endoscopy;  Laterality: N/A;  . COLOSTOMY N/A 11/23/2016   Procedure: SIGMOID COLOSTOMY;  Surgeon: Christene Lye, MD;  Location: [facility] ORS;  Service: General;  Laterality: N/A;  . ERCP    . LAPAROSCOPY N/A 11/23/2016   Procedure: LAPAROSCOPY DIAGNOSTIC;  Surgeon: Christene Lye, MD;  Location: [facility] ORS;  Service: General;  Laterality: N/A;  . PARTIAL HYSTERECTOMY    . PORTACATH PLACEMENT N/A 08/18/2016   Procedure:  INSERTION PORT-A-CATH;  Surgeon: Christene Lye, MD;  Location: [facility] ORS;  Service: General;  Laterality: N/A;    FAMILY HISTORY :  Mom- gall bladder ca; breast cancer- 85s; mat grand ma- stomach cancer- 69s; sblings- 2 no cancers; 2 daughters- one daughter? Cervical  Family History  Problem Relation Age of Onset  . Breast cancer Mother   . Hyperlipidemia Father   . Heart disease Paternal Grandmother   . Stroke Maternal Grandfather     SOCIAL HISTORY:   Social History  Substance Use Topics  . Smoking status: Former Smoker    Packs/day: 0.25    Years: 35.00    Types: Cigarettes    Quit date: 08/11/2016  . Smokeless tobacco: Never Used  . Alcohol use No     Comment: occasional    ALLERGIES:  is allergic to prednisone.  MEDICATIONS:  Current Outpatient Prescriptions  Medication Sig Dispense Refill  . clopidogrel (PLAVIX) 75 MG tablet Take 75 mg by mouth every evening.     . diphenoxylate-atropine (LOMOTIL) 2.5-0.025 MG tablet Take 1 tablet by mouth 4 (four) times daily as needed for diarrhea or loose stools. Take it along with immodium 30 tablet 0  . lisinopril (PRINIVIL,ZESTRIL) 10 MG tablet Take 5 mg by mouth every morning.     . nitroGLYCERIN (NITROSTAT) 0.4 MG SL tablet Place 1 tablet (0.4 mg total) under the tongue every 5 (five) minutes as needed for chest pain. 25 tablet 6  . oxyCODONE (OXY IR/ROXICODONE) 5 MG immediate release tablet Take 1-2 tablets (5-10 mg total) by mouth every 4 (four) hours as needed for moderate pain. 30 tablet 0  . potassium chloride SA (K-DUR,KLOR-CON) 20 MEQ tablet TAKE ONE TABLET BY MOUTH TWICE DAILY 60 tablet 2  . traMADol (ULTRAM) 50 MG tablet Take 50 mg by mouth every 6 (six) hours as needed (for pain.).     [name] venlafaxine XR (EFFEXOR-XR) 37.5 MG 24 hr capsule TAKE ONE CAPSULE BY MOUTH EVERY MORNING WITH BREAKFAST 30 capsule 0   No current facility-administered medications for this visit.     PHYSICAL EXAMINATION: ECOG PERFORMANCE  STATUS: 1 - Symptomatic but completely ambulatory  BP (!) 141/96 (BP Location: Right Arm, Patient Position: Sitting)   Pulse 81   Temp 98.3 F (36.8 C) (Tympanic)   Resp 16   Wt 141 lb 2 oz (64 kg)   BMI 22.78 kg/m   Filed Weights   06/11/17 0938  Weight: 141 lb 2 oz (64 kg)    GENERAL: Well-nourished well-developed; Alert, no distress and comfortable. Accompanied by her family. EYES: no pallor or icterus OROPHARYNX: no thrush or ulceration; good dentition  NECK: supple, no masses felt LYMPH:  no palpable lymphadenopathy in the cervical, axillary or inguinal regions LUNGS: clear to auscultation and  No wheeze or crackles HEART/CVS: regular rate & rhythm and no murmurs; No lower extremity edema ABDOMEN:abdomen soft, non-tender and normal bowel sounds Musculoskeletal:no cyanosis of digits and no clubbing  PSYCH: alert &  oriented x 3; flat affect NEURO: no focal motor/sensory deficits SKIN:  no rashes or significant lesions  LABORATORY DATA:  I have reviewed the data as listed    Component Value Date/Time   NA 140 06/11/2017 0914   NA 140 03/08/2017 1223   NA 144 07/25/2014 0444   K 3.3 (L) 06/11/2017 0914   K 3.7 07/25/2014 0444   CL 105 06/11/2017 0914   CL 107 07/25/2014 0444   CO2 27 06/11/2017 0914   CO2 30 07/25/2014 0444   GLUCOSE 64 (L) 06/11/2017 0914   GLUCOSE 87 07/25/2014 0444   BUN 11 06/11/2017 0914   BUN 13 03/08/2017 1223   BUN 8 07/25/2014 0444   CREATININE 0.63 06/11/2017 0914   CREATININE 0.69 07/25/2014 0444   CALCIUM 8.9 06/11/2017 0914   CALCIUM 8.4 (L) 07/25/2014 0444   PROT 7.4 06/11/2017 0914   PROT 6.8 03/08/2017 1223   PROT 6.5 07/25/2014 0444   ALBUMIN 3.7 06/11/2017 0914   ALBUMIN 4.2 03/08/2017 1223   ALBUMIN 2.6 (L) 07/25/2014 0444   AST 22 06/11/2017 0914   AST 96 (H) 07/25/2014 0444   ALT 13 (L) 06/11/2017 0914   ALT 123 (H) 07/25/2014 0444   ALKPHOS 127 (H) 06/11/2017 0914   ALKPHOS 189 (H) 07/25/2014 0444   BILITOT 0.3  06/11/2017 0914   BILITOT 0.2 03/08/2017 1223   BILITOT 2.0 (H) 07/25/2014 0444   GFRNONAA >60 06/11/2017 0914   GFRNONAA >60 07/25/2014 0444   GFRAA >60 06/11/2017 0914   GFRAA >60 07/25/2014 0444    No results found for: SPEP, UPEP  Lab Results  Component Value Date   WBC 7.5 06/11/2017   NEUTROABS 5.8 06/11/2017   HGB 11.8 (L) 06/11/2017   HCT 35.6 06/11/2017   MCV 83.7 06/11/2017   PLT 147 (L) 06/11/2017      Chemistry      Component Value Date/Time   NA 140 06/11/2017 0914   NA 140 03/08/2017 1223   NA 144 07/25/2014 0444   K 3.3 (L) 06/11/2017 0914   K 3.7 07/25/2014 0444   CL 105 06/11/2017 0914   CL 107 07/25/2014 0444   CO2 27 06/11/2017 0914   CO2 30 07/25/2014 0444   BUN 11 06/11/2017 0914   BUN 13 03/08/2017 1223   BUN 8 07/25/2014 0444   CREATININE 0.63 06/11/2017 0914   CREATININE 0.69 07/25/2014 0444      Component Value Date/Time   CALCIUM 8.9 06/11/2017 0914   CALCIUM 8.4 (L) 07/25/2014 0444   ALKPHOS 127 (H) 06/11/2017 0914   ALKPHOS 189 (H) 07/25/2014 0444   AST 22 06/11/2017 0914   AST 96 (H) 07/25/2014 0444   ALT 13 (L) 06/11/2017 0914   ALT 123 (H) 07/25/2014 0444   BILITOT 0.3 06/11/2017 0914   BILITOT 0.2 03/08/2017 1223   BILITOT 2.0 (H) 07/25/2014 0444     IMPRESSION: 1. Ill-defined circumferential wall thickening in the distal rectum with perirectal edema/inflammation. The soft tissue nodule contiguous with the right aspect of the rectum on the prior study, likely representing direct distention, has decreased in the interval. Small perirectal lymph nodes persist. 2. No evidence for metastatic disease elsewhere in the abdomen or pelvis   Electronically Signed   By: Misty Stanley M.D.   On: 11/15/2016 17:23  RADIOGRAPHIC STUDIES: I have personally reviewed the radiological images as listed and agreed with the findings in the report. No results found.   ASSESSMENT & PLAN:  Rectal  cancer metastasized to intrapelvic lymph  node ([place]) # Rectal  Cancer- stage III- s/p neoadjuvant chemoradiation with 5-FU continuous infusion with radiation; s/p APR April 7th 2018;  On adjuvant FOLFOX.  # proceed with cycle  #3 today.Labs today reviewed;  acceptable for treatment today. DC pump over 72 hours.  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 Temporary monitor for now.  # Hypokalemia- 3.3 continue 24mq/day. Discussed regarding dietary supp.   # Depression- recommend continued Effexor.  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  # follow up in 2 weeks/labs/MD; again in 4 weeks.    No orders of the defined types were placed in this encounter.      GCammie Sickle MD 06/11/2017 5:29 PM

## 2017-06-11 NOTE — Progress Notes (Signed)
Patient here today for follow up.  Patient c/o itching/buring in hands, lips and chest with chemo

## 2017-06-11 NOTE — Assessment & Plan Note (Addendum)
#   Rectal  Cancer- stage III- s/p neoadjuvant chemoradiation with 5-FU continuous infusion with radiation; s/p APR April 7th 2018;  On adjuvant FOLFOX.  # proceed with cycle  #3 today.Labs today reviewed;  acceptable for treatment today. DC pump over 72 hours.  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 Temporary monitor for now.  # Hypokalemia- 3.3 continue 22meq/day. Discussed regarding dietary supp.   # Depression- recommend continued Effexor.  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  # follow up in 2 weeks/labs/MD; again in 4 weeks.

## 2017-06-14 ENCOUNTER — Inpatient Hospital Stay: Payer: Medicaid Other

## 2017-06-14 VITALS — BP 136/86 | HR 81 | Resp 20

## 2017-06-14 DIAGNOSIS — C20 Malignant neoplasm of rectum: Secondary | ICD-10-CM

## 2017-06-14 DIAGNOSIS — Z5111 Encounter for antineoplastic chemotherapy: Secondary | ICD-10-CM | POA: Diagnosis not present

## 2017-06-14 DIAGNOSIS — C775 Secondary and unspecified malignant neoplasm of intrapelvic lymph nodes: Principal | ICD-10-CM

## 2017-06-14 MED ORDER — SODIUM CHLORIDE 0.9% FLUSH
10.0000 mL | INTRAVENOUS | Status: DC | PRN
  Administered 2017-06-14: 10 mL
  Filled 2017-06-14: qty 10

## 2017-06-14 MED ORDER — HEPARIN SOD (PORK) LOCK FLUSH 100 UNIT/ML IV SOLN
500.0000 [IU] | Freq: Once | INTRAVENOUS | Status: AC | PRN
  Administered 2017-06-14: 500 [IU]

## 2017-06-25 ENCOUNTER — Inpatient Hospital Stay (HOSPITAL_BASED_OUTPATIENT_CLINIC_OR_DEPARTMENT_OTHER): Payer: Medicaid Other | Admitting: Internal Medicine

## 2017-06-25 ENCOUNTER — Inpatient Hospital Stay: Payer: Medicaid Other

## 2017-06-25 VITALS — BP 144/87 | HR 66 | Temp 98.0°F | Resp 16 | Wt 144.0 lb

## 2017-06-25 DIAGNOSIS — C775 Secondary and unspecified malignant neoplasm of intrapelvic lymph nodes: Secondary | ICD-10-CM | POA: Diagnosis not present

## 2017-06-25 DIAGNOSIS — Z9221 Personal history of antineoplastic chemotherapy: Secondary | ICD-10-CM

## 2017-06-25 DIAGNOSIS — C20 Malignant neoplasm of rectum: Secondary | ICD-10-CM

## 2017-06-25 DIAGNOSIS — Z5111 Encounter for antineoplastic chemotherapy: Secondary | ICD-10-CM | POA: Diagnosis not present

## 2017-06-25 DIAGNOSIS — Z923 Personal history of irradiation: Secondary | ICD-10-CM | POA: Diagnosis not present

## 2017-06-25 DIAGNOSIS — E041 Nontoxic single thyroid nodule: Secondary | ICD-10-CM

## 2017-06-25 DIAGNOSIS — Z79899 Other long term (current) drug therapy: Secondary | ICD-10-CM

## 2017-06-25 DIAGNOSIS — E876 Hypokalemia: Secondary | ICD-10-CM | POA: Diagnosis not present

## 2017-06-25 DIAGNOSIS — Z87891 Personal history of nicotine dependence: Secondary | ICD-10-CM

## 2017-06-25 DIAGNOSIS — D696 Thrombocytopenia, unspecified: Secondary | ICD-10-CM | POA: Diagnosis not present

## 2017-06-25 DIAGNOSIS — F329 Major depressive disorder, single episode, unspecified: Secondary | ICD-10-CM | POA: Diagnosis not present

## 2017-06-25 LAB — CBC WITH DIFFERENTIAL/PLATELET
BASOS PCT: 1 %
Basophils Absolute: 0 10*3/uL (ref 0–0.1)
EOS ABS: 0.1 10*3/uL (ref 0–0.7)
EOS PCT: 2 %
HCT: 33.3 % — ABNORMAL LOW (ref 35.0–47.0)
HEMOGLOBIN: 11.2 g/dL — AB (ref 12.0–16.0)
LYMPHS ABS: 0.6 10*3/uL — AB (ref 1.0–3.6)
Lymphocytes Relative: 14 %
MCH: 27.8 pg (ref 26.0–34.0)
MCHC: 33.6 g/dL (ref 32.0–36.0)
MCV: 82.7 fL (ref 80.0–100.0)
MONO ABS: 0.4 10*3/uL (ref 0.2–0.9)
MONOS PCT: 10 %
NEUTROS PCT: 73 %
Neutro Abs: 3.1 10*3/uL (ref 1.4–6.5)
Platelets: 118 10*3/uL — ABNORMAL LOW (ref 150–440)
RBC: 4.03 MIL/uL (ref 3.80–5.20)
RDW: 19.5 % — AB (ref 11.5–14.5)
WBC: 4.3 10*3/uL (ref 3.6–11.0)

## 2017-06-25 LAB — COMPREHENSIVE METABOLIC PANEL
ALK PHOS: 103 U/L (ref 38–126)
ALT: 10 U/L — AB (ref 14–54)
AST: 24 U/L (ref 15–41)
Albumin: 3.5 g/dL (ref 3.5–5.0)
Anion gap: 5 (ref 5–15)
BUN: 13 mg/dL (ref 6–20)
CALCIUM: 9.1 mg/dL (ref 8.9–10.3)
CHLORIDE: 105 mmol/L (ref 101–111)
CO2: 28 mmol/L (ref 22–32)
CREATININE: 0.62 mg/dL (ref 0.44–1.00)
GFR calc Af Amer: >60 mL/min (ref >60)
GFR calc non Af Amer: >60 mL/min (ref >60)
GLUCOSE: 147 mg/dL — AB (ref 65–99)
Potassium: 3.3 mmol/L — ABNORMAL LOW (ref 3.5–5.1)
SODIUM: 138 mmol/L (ref 135–145)
Total Bilirubin: 0.3 mg/dL (ref 0.3–1.2)
Total Protein: 6.8 g/dL (ref 6.5–8.1)

## 2017-06-25 MED ORDER — PALONOSETRON HCL INJECTION 0.25 MG/5ML
0.2500 mg | Freq: Once | INTRAVENOUS | Status: AC
  Administered 2017-06-25: 0.25 mg via INTRAVENOUS
  Filled 2017-06-25: qty 5

## 2017-06-25 MED ORDER — HEPARIN SOD (PORK) LOCK FLUSH 100 UNIT/ML IV SOLN
500.0000 [IU] | Freq: Once | INTRAVENOUS | Status: AC
  Administered 2017-06-25: 500 [IU] via INTRAVENOUS

## 2017-06-25 MED ORDER — SODIUM CHLORIDE 0.9% FLUSH
10.0000 mL | INTRAVENOUS | Status: DC | PRN
  Administered 2017-06-25: 10 mL via INTRAVENOUS
  Filled 2017-06-25: qty 10

## 2017-06-25 MED ORDER — FLUOROURACIL CHEMO INJECTION 2.5 GM/50ML
400.0000 mg/m2 | Freq: Once | INTRAVENOUS | Status: AC
  Administered 2017-06-25: 700 mg via INTRAVENOUS
  Filled 2017-06-25: qty 14

## 2017-06-25 MED ORDER — DEXTROSE 5 % IV SOLN
Freq: Once | INTRAVENOUS | Status: AC
Start: 2017-06-25 — End: 2017-06-25
  Administered 2017-06-25: 11:00:00 via INTRAVENOUS
  Filled 2017-06-25: qty 1000

## 2017-06-25 MED ORDER — LEUCOVORIN CALCIUM INJECTION 350 MG
700.0000 mg | Freq: Once | INTRAVENOUS | Status: AC
  Administered 2017-06-25: 700 mg via INTRAVENOUS
  Filled 2017-06-25: qty 35

## 2017-06-25 MED ORDER — OXALIPLATIN CHEMO INJECTION 100 MG/20ML
150.0000 mg | Freq: Once | INTRAVENOUS | Status: AC
  Administered 2017-06-25: 150 mg via INTRAVENOUS
  Filled 2017-06-25: qty 20

## 2017-06-25 MED ORDER — DEXAMETHASONE SODIUM PHOSPHATE 10 MG/ML IJ SOLN
10.0000 mg | Freq: Once | INTRAMUSCULAR | Status: AC
  Administered 2017-06-25: 10 mg via INTRAVENOUS
  Filled 2017-06-25: qty 1

## 2017-06-25 MED ORDER — SODIUM CHLORIDE 0.9 % IV SOLN
2400.0000 mg/m2 | INTRAVENOUS | Status: DC
  Administered 2017-06-25: 4100 mg via INTRAVENOUS
  Filled 2017-06-25: qty 82

## 2017-06-25 NOTE — Progress Notes (Signed)
Patient has right side back and leg pain of 3/10 on pain scale.  This pain is chronic and no worse than her usual.  Does not have much of an appetite for 1 week after treatment.

## 2017-06-25 NOTE — Progress Notes (Signed)
[place] OFFICE PROGRESS NOTE  Patient Care Team: Lynnell Jude, MD as PCP - General (Family Medicine) Christene Lye, MD (General Surgery)  Cancer Staging No matching staging information was found for the patient.   Oncology History   # SEP 2017- RECTAL CA mod diff adeno; STAGE III [no EUS; positive ~56m LN; Dr.sankar] CEA-7; Sep 21st START 5FU-RT [finished NOV 2017]; April 7th APR- 2018- ypT3ypN0 [5LN]; positive for LVI/PNI [poor response to chemo]; Margins-Negative.  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          [place]     INTERVAL HISTORY:  Rachel LAFOSSE89y.o.  female patient with rectal cancer is Currently Status post surgery- currently undergoing adjuvant chemotherapy. She is currently status post 3 cycles of chemotherapy.  Patient completes of mild nausea with chemotherapy. Improved with antiemetics. No vomiting. No diarrhea. No sores in the mouth.   She complains of mild chronic back pain. Mild tingling and numbness in the extremities. Currently resolved. Denies any nosebleeds or gum bleeding.  No fevers or chills. Otherwise no chest pain or shortness of breath or cough.   REVIEW OF SYSTEMS:  A complete 10 point review of system is done which is negative except mentioned above/history of present illness.   PAST MEDICAL HISTORY :  Past Medical History:  Diagnosis Date  . Arthritis   . Body mass index (BMI) of 24.0-24.9 in adult   . Cardiovascular disease   . Current every day smoker    a. ~35 years - > has cut down to < 3/4 ppd  . Headache   . History of UTI   . Hypertension   . Midsternal chest pain    a. 05/2014 Stress Echo: Ex time:  8:09, ECG w/o acute changes, EF nl with possible mid and apical anterior HK-->cath recommended.  . Port-a-cath in place    RIGHT SIDE  . [place]  . [place]    [place]/2016 tia no neurology followup now seen by pcp  . Syncope and collapse   . Thyroid nodule 2018   FINDINGS CONSISTENT WITH A HURTHLE CELL LESION AND/OR NEOPLASM (BETHESDA CATEGORY IV).    PAST SURGICAL HISTORY :   Past Surgical History:  Procedure Laterality Date  . ABDOMINAL HYSTERECTOMY    . ABDOMINAL PERINEAL BOWEL RESECTION N/A 03/16/2017   Procedure: ABDOMINAL PERINEAL RESECTION;  Surgeon: SChristene Lye MD;  Location: [facility];  Service: General;  Laterality: N/A;  . APPENDECTOMY  03/16/2017   Procedure: APPENDECTOMY;  Surgeon: SChristene Lye MD;  Location: [facility];  Service: General;;  . BACK SURGERY     l 3,4,5  . BIOPSY THYROID Right 11/16/2016   FINDINGS CONSISTENT WITH A HURTHLE CELL LESION AND/OR NEOPLASM (BETHESDA CATEGORY IV).  .Marland KitchenCHOLECYSTECTOMY    . COLONOSCOPY WITH PROPOFOL N/A 08/15/2016   Procedure: COLONOSCOPY WITH PROPOFOL;  Surgeon: MLollie Sails MD;  [facility];  Service: Endoscopy;  Laterality: N/A;  . COLONOSCOPY WITH PROPOFOL N/A 11/08/2016   Procedure: COLONOSCOPY WITH PROPOFOL;  Surgeon: SChristene Lye MD;  Location: [facility];  Service: Endoscopy;  Laterality: N/A;  . COLOSTOMY N/A 11/23/2016   Procedure: SIGMOID COLOSTOMY;  Surgeon: SAndreas Newport  Jamal Collin, MD;  Location: [facility];  Service: General;  Laterality: N/A;  . ERCP    . LAPAROSCOPY N/A 11/23/2016   Procedure: LAPAROSCOPY DIAGNOSTIC;  Surgeon: Christene Lye, MD;  Location: [facility];  Service: General;  Laterality: N/A;  . PARTIAL HYSTERECTOMY    . PORTACATH PLACEMENT N/A 08/18/2016   Procedure: INSERTION PORT-A-CATH;  Surgeon: Christene Lye, MD;  Location: [facility];  Service: General;  Laterality: N/A;    FAMILY HISTORY :  Mom- gall bladder ca; breast  cancer- 55s; mat grand ma- stomach cancer- 38s; sblings- 2 no cancers; 2 daughters- one daughter? Cervical  Family History  Problem Relation Age of Onset  . Breast cancer Mother   . Hyperlipidemia Father   . Heart disease Paternal Grandmother   . Stroke Maternal Grandfather     SOCIAL HISTORY:   Social History  Substance Use Topics  . Smoking status: Former Smoker    Packs/day: 0.25    Years: 35.00    Types: Cigarettes    Quit date: 08/11/2016  . Smokeless tobacco: Never Used  . Alcohol use No     Comment: occasional    ALLERGIES:  is allergic to prednisone.  MEDICATIONS:  Current Outpatient Prescriptions  Medication Sig Dispense Refill  . clopidogrel (PLAVIX) 75 MG tablet Take 75 mg by mouth every evening.     . diphenoxylate-atropine (LOMOTIL) 2.5-0.025 MG tablet Take 1 tablet by mouth 4 (four) times daily as needed for diarrhea or loose stools. Take it along with immodium 30 tablet 0  . lisinopril (PRINIVIL,ZESTRIL) 10 MG tablet Take 5 mg by mouth every morning.     . nitroGLYCERIN (NITROSTAT) 0.4 MG SL tablet Place 1 tablet (0.4 mg total) under the tongue every 5 (five) minutes as needed for chest pain. 25 tablet 6  . potassium chloride SA (K-DUR,KLOR-CON) 20 MEQ tablet TAKE ONE TABLET BY MOUTH TWICE DAILY 60 tablet 2  . traMADol (ULTRAM) 50 MG tablet Take 50 mg by mouth every 6 (six) hours as needed (for pain.).     [name] venlafaxine XR (EFFEXOR-XR) 37.5 MG 24 hr capsule TAKE ONE CAPSULE BY MOUTH EVERY MORNING WITH BREAKFAST 30 capsule 0  . oxyCODONE (OXY IR/ROXICODONE) 5 MG immediate release tablet Take 1-2 tablets (5-10 mg total) by mouth every 4 (four) hours as needed for moderate pain. (Patient not taking: Reported on 06/25/2017) 30 tablet 0   No current facility-administered medications for this visit.    Facility-Administered Medications Ordered in Other Visits  Medication Dose Route Frequency Provider Last Rate Last Dose  . fluorouracil (ADRUCIL) 4,100 mg in sodium  chloride 0.9 % 68 mL chemo infusion  2,400 mg/m2 (Treatment Plan Recorded) Intravenous 1 day or 1 dose Charlaine Dalton R, MD      . fluorouracil (ADRUCIL) chemo injection 700 mg  400 mg/m2 (Treatment Plan Recorded) Intravenous Once Charlaine Dalton R, MD      . leucovorin 700 mg in dextrose 5 % 250 mL infusion  700 mg Intravenous Once Cammie Sickle, MD 143 mL/hr at 06/25/17 1148 700 mg at 06/25/17 1148  . oxaliplatin (ELOXATIN) 150 mg in dextrose 5 % 500 mL chemo infusion  150 mg Intravenous Once Cammie Sickle, MD 265 mL/hr at 06/25/17 1148 150 mg at 06/25/17 1148  . sodium chloride flush (NS) 0.9 % injection 10 mL  10 mL Intravenous PRN Cammie Sickle, MD   10 mL at 06/25/17 1004    PHYSICAL EXAMINATION: ECOG PERFORMANCE STATUS: 1 - Symptomatic  but completely ambulatory  BP (!) 144/87   Pulse 66   Temp 98 F (36.7 C) (Tympanic)   Resp 16   Wt 144 lb (65.3 kg)   BMI 23.24 kg/m   Filed Weights   06/25/17 1016  Weight: 144 lb (65.3 kg)    GENERAL: Well-nourished well-developed; Alert, no distress and comfortable. Accompanied by her family. EYES: no pallor or icterus OROPHARYNX: no thrush or ulceration; good dentition  NECK: supple, no masses felt LYMPH:  no palpable lymphadenopathy in the cervical, axillary or inguinal regions LUNGS: clear to auscultation and  No wheeze or crackles HEART/CVS: regular rate & rhythm and no murmurs; No lower extremity edema ABDOMEN:abdomen soft, non-tender and normal bowel sounds Musculoskeletal:no cyanosis of digits and no clubbing  PSYCH: alert & oriented x 3; flat affect NEURO: no focal motor/sensory deficits SKIN:  no rashes or significant lesions  LABORATORY DATA:  I have reviewed the data as listed    Component Value Date/Time   NA 138 06/25/2017 1000   NA 140 03/08/2017 1223   NA 144 07/25/2014 0444   K 3.3 (L) 06/25/2017 1000   K 3.7 07/25/2014 0444   CL 105 06/25/2017 1000   CL 107 07/25/2014 0444    CO2 28 06/25/2017 1000   CO2 30 07/25/2014 0444   GLUCOSE 147 (H) 06/25/2017 1000   GLUCOSE 87 07/25/2014 0444   BUN 13 06/25/2017 1000   BUN 13 03/08/2017 1223   BUN 8 07/25/2014 0444   CREATININE 0.62 06/25/2017 1000   CREATININE 0.69 07/25/2014 0444   CALCIUM 9.1 06/25/2017 1000   CALCIUM 8.4 (L) 07/25/2014 0444   PROT 6.8 06/25/2017 1000   PROT 6.8 03/08/2017 1223   PROT 6.5 07/25/2014 0444   ALBUMIN 3.5 06/25/2017 1000   ALBUMIN 4.2 03/08/2017 1223   ALBUMIN 2.6 (L) 07/25/2014 0444   AST 24 06/25/2017 1000   AST 96 (H) 07/25/2014 0444   ALT 10 (L) 06/25/2017 1000   ALT 123 (H) 07/25/2014 0444   ALKPHOS 103 06/25/2017 1000   ALKPHOS 189 (H) 07/25/2014 0444   BILITOT 0.3 06/25/2017 1000   BILITOT 0.2 03/08/2017 1223   BILITOT 2.0 (H) 07/25/2014 0444   GFRNONAA >60 06/25/2017 1000   GFRNONAA >60 07/25/2014 0444   GFRAA >60 06/25/2017 1000   GFRAA >60 07/25/2014 0444    No results found for: SPEP, UPEP  Lab Results  Component Value Date   WBC 4.3 06/25/2017   NEUTROABS 3.1 06/25/2017   HGB 11.2 (L) 06/25/2017   HCT 33.3 (L) 06/25/2017   MCV 82.7 06/25/2017   PLT 118 (L) 06/25/2017      Chemistry      Component Value Date/Time   NA 138 06/25/2017 1000   NA 140 03/08/2017 1223   NA 144 07/25/2014 0444   K 3.3 (L) 06/25/2017 1000   K 3.7 07/25/2014 0444   CL 105 06/25/2017 1000   CL 107 07/25/2014 0444   CO2 28 06/25/2017 1000   CO2 30 07/25/2014 0444   BUN 13 06/25/2017 1000   BUN 13 03/08/2017 1223   BUN 8 07/25/2014 0444   CREATININE 0.62 06/25/2017 1000   CREATININE 0.69 07/25/2014 0444      Component Value Date/Time   CALCIUM 9.1 06/25/2017 1000   CALCIUM 8.4 (L) 07/25/2014 0444   ALKPHOS 103 06/25/2017 1000   ALKPHOS 189 (H) 07/25/2014 0444   AST 24 06/25/2017 1000   AST 96 (H) 07/25/2014 0444   ALT 10 (L)  06/25/2017 1000   ALT 123 (H) 07/25/2014 0444   BILITOT 0.3 06/25/2017 1000   BILITOT 0.2 03/08/2017 1223   BILITOT 2.0 (H) 07/25/2014  0444     IMPRESSION: 1. Ill-defined circumferential wall thickening in the distal rectum with perirectal edema/inflammation. The soft tissue nodule contiguous with the right aspect of the rectum on the prior study, likely representing direct distention, has decreased in the interval. Small perirectal lymph nodes persist. 2. No evidence for metastatic disease elsewhere in the abdomen or pelvis   Electronically Signed   By: Misty Stanley M.D.   On: 11/15/2016 17:23  RADIOGRAPHIC STUDIES: I have personally reviewed the radiological images as listed and agreed with the findings in the report. No results found.   ASSESSMENT & PLAN:  Rectal cancer metastasized to intrapelvic lymph node ([place]) # Rectal  Cancer- stage III- s/p neoadjuvant chemoradiation with 5-FU continuous infusion with radiation; s/p APR April 7th 2018;  On adjuvant FOLFOX. 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 Monitor for now.  # Nausea- G-1- continue antiemetics.  # Hypokalemia- 3.3 continue 69mq/day. Discussed regarding dietary supp.   # Thrombocytopenia- 118. Monitor for now.   # Depression- stable. recommend continued Effexor.  # thyroid nodule- positive on PET scan; Bx- Hurtle cell neoplasm/ [name]- will need definitive management post finishing adjuvant therapy.  # follow up in 2 weeks/labs/MD; again in 4 weeks.    No orders of the defined types were placed in this encounter.      GCammie Sickle MD 06/25/2017 1:45 PM

## 2017-06-25 NOTE — Assessment & Plan Note (Addendum)
#   Rectal  Cancer- stage III- s/p neoadjuvant chemoradiation with 5-FU continuous infusion with radiation; s/p APR April 7th 2018;  On adjuvant FOLFOX. 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 Monitor for now.  # Nausea- G-1- continue antiemetics.  # Hypokalemia- 3.3 continue 61meq/day. Discussed regarding dietary supp.   # Thrombocytopenia- 118. Monitor for now.   # Depression- stable. recommend continued Effexor.  # thyroid nodule- positive on PET scan; Bx- Hurtle cell neoplasm/ [name]- will need definitive management post finishing adjuvant therapy.  # follow up in 2 weeks/labs/MD; again in 4 weeks.

## 2017-06-27 ENCOUNTER — Inpatient Hospital Stay: Payer: Medicaid Other

## 2017-06-27 VITALS — BP 128/84 | HR 69 | Temp 98.1°F | Resp 18

## 2017-06-27 DIAGNOSIS — C801 Malignant (primary) neoplasm, unspecified: Secondary | ICD-10-CM

## 2017-06-27 DIAGNOSIS — Z5111 Encounter for antineoplastic chemotherapy: Secondary | ICD-10-CM | POA: Diagnosis not present

## 2017-06-27 MED ORDER — HEPARIN SOD (PORK) LOCK FLUSH 100 UNIT/ML IV SOLN
INTRAVENOUS | Status: AC
  Filled 2017-06-27: qty 5

## 2017-06-27 MED ORDER — SODIUM CHLORIDE 0.9% FLUSH
10.0000 mL | INTRAVENOUS | Status: DC | PRN
  Administered 2017-06-27: 10 mL via INTRAVENOUS
  Filled 2017-06-27: qty 10

## 2017-06-27 MED ORDER — HEPARIN SOD (PORK) LOCK FLUSH 100 UNIT/ML IV SOLN
500.0000 [IU] | Freq: Once | INTRAVENOUS | Status: AC
  Administered 2017-06-27: 500 [IU] via INTRAVENOUS

## 2017-07-05 LAB — SURGICAL PATHOLOGY

## 2017-07-09 ENCOUNTER — Inpatient Hospital Stay: Payer: Medicaid Other

## 2017-07-09 ENCOUNTER — Inpatient Hospital Stay (HOSPITAL_BASED_OUTPATIENT_CLINIC_OR_DEPARTMENT_OTHER): Payer: Medicaid Other | Admitting: Internal Medicine

## 2017-07-09 VITALS — BP 146/104 | HR 80 | Temp 97.2°F | Wt 142.6 lb

## 2017-07-09 DIAGNOSIS — Z79899 Other long term (current) drug therapy: Secondary | ICD-10-CM

## 2017-07-09 DIAGNOSIS — D696 Thrombocytopenia, unspecified: Secondary | ICD-10-CM

## 2017-07-09 DIAGNOSIS — Z5111 Encounter for antineoplastic chemotherapy: Secondary | ICD-10-CM | POA: Diagnosis not present

## 2017-07-09 DIAGNOSIS — C20 Malignant neoplasm of rectum: Secondary | ICD-10-CM

## 2017-07-09 DIAGNOSIS — E876 Hypokalemia: Secondary | ICD-10-CM

## 2017-07-09 DIAGNOSIS — C775 Secondary and unspecified malignant neoplasm of intrapelvic lymph nodes: Secondary | ICD-10-CM | POA: Diagnosis not present

## 2017-07-09 LAB — CBC WITH DIFFERENTIAL/PLATELET
BASOS ABS: 0 10*3/uL (ref 0–0.1)
BASOS PCT: 1 %
EOS ABS: 0.1 10*3/uL (ref 0–0.7)
EOS PCT: 2 %
HCT: 34.1 % — ABNORMAL LOW (ref 35.0–47.0)
Hemoglobin: 11.5 g/dL — ABNORMAL LOW (ref 12.0–16.0)
Lymphocytes Relative: 16 %
Lymphs Abs: 0.6 10*3/uL — ABNORMAL LOW (ref 1.0–3.6)
MCH: 28 pg (ref 26.0–34.0)
MCHC: 33.6 g/dL (ref 32.0–36.0)
MCV: 83.2 fL (ref 80.0–100.0)
Monocytes Absolute: 0.5 10*3/uL (ref 0.2–0.9)
Monocytes Relative: 13 %
Neutro Abs: 2.5 10*3/uL (ref 1.4–6.5)
Neutrophils Relative %: 68 %
PLATELETS: 67 10*3/uL — AB (ref 150–440)
RBC: 4.1 MIL/uL (ref 3.80–5.20)
RDW: 20.4 % — AB (ref 11.5–14.5)
WBC: 3.7 10*3/uL (ref 3.6–11.0)

## 2017-07-09 LAB — COMPREHENSIVE METABOLIC PANEL
ALK PHOS: 96 U/L (ref 38–126)
ALT: 13 U/L — AB (ref 14–54)
AST: 27 U/L (ref 15–41)
Albumin: 3.4 g/dL — ABNORMAL LOW (ref 3.5–5.0)
Anion gap: 6 (ref 5–15)
BILIRUBIN TOTAL: 0.4 mg/dL (ref 0.3–1.2)
BUN: 12 mg/dL (ref 6–20)
CALCIUM: 9 mg/dL (ref 8.9–10.3)
CO2: 29 mmol/L (ref 22–32)
CREATININE: 0.69 mg/dL (ref 0.44–1.00)
Chloride: 105 mmol/L (ref 101–111)
GFR calc Af Amer: >60 mL/min (ref >60)
Glucose, Bld: 180 mg/dL — ABNORMAL HIGH (ref 65–99)
POTASSIUM: 3.2 mmol/L — AB (ref 3.5–5.1)
Sodium: 140 mmol/L (ref 135–145)
TOTAL PROTEIN: 6.5 g/dL (ref 6.5–8.1)

## 2017-07-09 MED ORDER — SODIUM CHLORIDE 0.9% FLUSH
10.0000 mL | Freq: Once | INTRAVENOUS | Status: AC
  Administered 2017-07-09: 10 mL via INTRAVENOUS
  Filled 2017-07-09: qty 10

## 2017-07-09 MED ORDER — HEPARIN SOD (PORK) LOCK FLUSH 100 UNIT/ML IV SOLN
500.0000 [IU] | Freq: Once | INTRAVENOUS | Status: AC
  Administered 2017-07-09: 500 [IU] via INTRAVENOUS
  Filled 2017-07-09: qty 5

## 2017-07-09 NOTE — Progress Notes (Signed)
Patient is here today for a follow up. Patient states no new concerns today.  

## 2017-07-09 NOTE — Assessment & Plan Note (Addendum)
#   Rectal  Cancer- stage III- s/p neoadjuvant chemoradiation with 5-FU continuous infusion with radiation; s/p APR April 7th 2018;  On adjuvant FOLFOX. Patient tolerating chemotherapy fairly well without major side effects-noted below except thrombocytopenia  # HOLD cycle  #5 today.Labs today reviewed;  Sec to low platelets- 67; will decrease the dose of oxaliplatin to 70mg /m2; and discontinue bolus 5 FU.   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 Monitor for now.  # Nausea- G-1- continue antiemetics.  # Hypokalemia- 3.2 continue 65meq/day. Discussed regarding dietary supp.   # Thrombocytopenia- 67; sec to ox; will decrease the dose.   # follow up in 2 weeks/labs/MD; again in 4 weeks.

## 2017-07-09 NOTE — Progress Notes (Signed)
[place] OFFICE PROGRESS NOTE  Patient Care Team: Lynnell Jude, MD as PCP - General (Family Medicine) Christene Lye, MD (General Surgery)  Cancer Staging No matching staging information was found for the patient.   Oncology History   # SEP 2017- RECTAL CA mod diff adeno; STAGE III [no EUS; positive ~68m LN; Dr.sankar] CEA-7; Sep 21st START 5FU-RT [finished NOV 2017]; April 7th APR- 2018- ypT3ypN0 [5LN]; positive for LVI/PNI [poor response to chemo]; Margins-Negative.  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  # smoker/ TIA [aug 2017- Plavix]  # Will order MMR/Mol testing on surgical resection  # Multiple family members-malignancy- ordered- MMR on surgical resection specimen.          [place]     INTERVAL HISTORY:  Rachel MENSING89y.o.  female patient with Above history of rectal cancer currently on adjuvant chemotherapy-with FOLFOX. Patient is currently status post 4 cycles.  Patient denies any significant tingling or numbness. She has intermittent difficulty with cold sensitivity. Patient complains of mild nausea with chemotherapy. Improved with antiemetics. No vomiting. No diarrhea. No sores in the mouth.   Denies any nosebleeds or gum bleeding.  No fevers or chills. Otherwise no chest pain or shortness of breath or cough. Mild intermittent difficulty swallowing.  REVIEW OF SYSTEMS:  A complete 10 point review of system is done which is negative except mentioned above/history of present illness.   PAST MEDICAL HISTORY :  Past Medical History:  Diagnosis Date  . Arthritis   . Body mass index (BMI) of 24.0-24.9 in adult   . Cardiovascular disease   . Current every day smoker     a. ~35 years - > has cut down to < 3/4 ppd  . Headache   . History of UTI   . Hypertension   . Midsternal chest pain    a. 05/2014 Stress Echo: Ex time: 8:09, ECG w/o acute changes, EF nl with possible mid and apical anterior HK-->cath recommended.  . Port-a-cath in place    RIGHT SIDE  . [place]  . [place] [place]    07[place] t[place] no neurology followup now seen by pcp  . Syncope and collapse   . Thyroid nodule 2018   FINDINGS CONSISTENT WITH A HURTHLE CELL LESION AND/OR NEOPLASM (BETHESDA CATEGORY IV).    PAST SURGICAL HISTORY :   Past Surgical History:  Procedure Laterality Date  . ABDOMINAL HYSTERECTOMY    . ABDOMINAL PERINEAL BOWEL RESECTION N/A 03/16/2017   Procedure: ABDOMINAL PERINEAL RESECTION;  Surgeon: SChristene Lye MD;  Location: [facility];  Service: General;  Laterality: N/A;  . APPENDECTOMY  03/16/2017   Procedure: APPENDECTOMY;  Surgeon: SChristene Lye MD;  Location: [facility];  Service: General;;  . BACK SURGERY     l 3,4,5  . BIOPSY THYROID Right 11/16/2016   FINDINGS CONSISTENT WITH A HURTHLE CELL LESION AND/OR NEOPLASM (BETHESDA CATEGORY IV).  .Marland KitchenCHOLECYSTECTOMY    . COLONOSCOPY WITH PROPOFOL N/A 08/15/2016   Procedure: COLONOSCOPY WITH PROPOFOL;  Surgeon: MLollie Sails MD;  [facility];  Service: Endoscopy;  Laterality: N/A;  . COLONOSCOPY WITH PROPOFOL N/A 11/08/2016   Procedure: COLONOSCOPY WITH PROPOFOL;  Surgeon: SChristene Lye MD;  Location: [facility];  Service: Endoscopy;  Laterality: N/A;  . COLOSTOMY N/A 11/23/2016   Procedure: SIGMOID COLOSTOMY;  Surgeon: Christene Lye, MD;  Location: [facility];  Service: General;  Laterality: N/A;  . ERCP    . LAPAROSCOPY N/A 11/23/2016   Procedure: LAPAROSCOPY DIAGNOSTIC;  Surgeon: Christene Lye, MD;  Location: [facility];  Service: General;  Laterality: N/A;  . PARTIAL HYSTERECTOMY    . PORTACATH PLACEMENT N/A 08/18/2016   Procedure: INSERTION  PORT-A-CATH;  Surgeon: Christene Lye, MD;  Location: [facility];  Service: General;  Laterality: N/A;    FAMILY HISTORY :  Mom- gall bladder ca; breast cancer- 36s; mat grand ma- stomach cancer- 65s; sblings- 2 no cancers; 2 daughters- one daughter? Cervical  Family History  Problem Relation Age of Onset  . Breast cancer Mother   . Hyperlipidemia Father   . Heart disease Paternal Grandmother   . Stroke Maternal Grandfather     SOCIAL HISTORY:   Social History  Substance Use Topics  . Smoking status: Former Smoker    Packs/day: 0.25    Years: 35.00    Types: Cigarettes    Quit date: 08/11/2016  . Smokeless tobacco: Never Used  . Alcohol use No     Comment: occasional    ALLERGIES:  is allergic to prednisone.  MEDICATIONS:  Current Outpatient Prescriptions  Medication Sig Dispense Refill  . clopidogrel (PLAVIX) 75 MG tablet Take 75 mg by mouth every evening.     . diphenoxylate-atropine (LOMOTIL) 2.5-0.025 MG tablet Take 1 tablet by mouth 4 (four) times daily as needed for diarrhea or loose stools. Take it along with immodium 30 tablet 0  . lisinopril (PRINIVIL,ZESTRIL) 10 MG tablet Take 5 mg by mouth every morning.     . nitroGLYCERIN (NITROSTAT) 0.4 MG SL tablet Place 1 tablet (0.4 mg total) under the tongue every 5 (five) minutes as needed for chest pain. 25 tablet 6  . oxyCODONE (OXY IR/ROXICODONE) 5 MG immediate release tablet Take 1-2 tablets (5-10 mg total) by mouth every 4 (four) hours as needed for moderate pain. 30 tablet 0  . potassium chloride SA (K-DUR,KLOR-CON) 20 MEQ tablet TAKE ONE TABLET BY MOUTH TWICE DAILY 60 tablet 2  . traMADol (ULTRAM) 50 MG tablet Take 50 mg by mouth every 6 (six) hours as needed (for pain.).     [name] venlafaxine XR (EFFEXOR-XR) 37.5 MG 24 hr capsule TAKE ONE CAPSULE BY MOUTH EVERY MORNING WITH BREAKFAST 30 capsule 0   No current facility-administered medications for this visit.     PHYSICAL EXAMINATION: ECOG PERFORMANCE STATUS: 1 -  Symptomatic but completely ambulatory  BP (!) 146/104 (BP Location: Left Arm, Patient Position: Sitting)   Pulse 80   Temp (!) 97.2 F (36.2 C) (Tympanic)   Wt 142 lb 9.6 oz (64.7 kg)   BMI 23.02 kg/m   Filed Weights   07/09/17 0911  Weight: 142 lb 9.6 oz (64.7 kg)    GENERAL: Well-nourished well-developed; Alert, no distress and comfortable. Accompanied by her family. EYES: no pallor or icterus OROPHARYNX: no thrush or ulceration; good dentition  NECK: supple, no masses felt LYMPH:  no palpable lymphadenopathy in the cervical, axillary or inguinal regions LUNGS: clear to auscultation and  No wheeze or crackles HEART/CVS: regular rate & rhythm and no murmurs; No lower extremity edema ABDOMEN:abdomen soft, non-tender and normal bowel sounds Musculoskeletal:no cyanosis of digits and no clubbing  PSYCH: alert & oriented x 3; flat affect NEURO: no focal motor/sensory deficits SKIN:  no  rashes or significant lesions  LABORATORY DATA:  I have reviewed the data as listed    Component Value Date/Time   NA 140 07/09/2017 0848   NA 140 03/08/2017 1223   NA 144 07/25/2014 0444   K 3.2 (L) 07/09/2017 0848   K 3.7 07/25/2014 0444   CL 105 07/09/2017 0848   CL 107 07/25/2014 0444   CO2 29 07/09/2017 0848   CO2 30 07/25/2014 0444   GLUCOSE 180 (H) 07/09/2017 0848   GLUCOSE 87 07/25/2014 0444   BUN 12 07/09/2017 0848   BUN 13 03/08/2017 1223   BUN 8 07/25/2014 0444   CREATININE 0.69 07/09/2017 0848   CREATININE 0.69 07/25/2014 0444   CALCIUM 9.0 07/09/2017 0848   CALCIUM 8.4 (L) 07/25/2014 0444   PROT 6.5 07/09/2017 0848   PROT 6.8 03/08/2017 1223   PROT 6.5 07/25/2014 0444   ALBUMIN 3.4 (L) 07/09/2017 0848   ALBUMIN 4.2 03/08/2017 1223   ALBUMIN 2.6 (L) 07/25/2014 0444   AST 27 07/09/2017 0848   AST 96 (H) 07/25/2014 0444   ALT 13 (L) 07/09/2017 0848   ALT 123 (H) 07/25/2014 0444   ALKPHOS 96 07/09/2017 0848   ALKPHOS 189 (H) 07/25/2014 0444   BILITOT 0.4 07/09/2017  0848   BILITOT 0.2 03/08/2017 1223   BILITOT 2.0 (H) 07/25/2014 0444   GFRNONAA >60 07/09/2017 0848   GFRNONAA >60 07/25/2014 0444   GFRAA >60 07/09/2017 0848   GFRAA >60 07/25/2014 0444    No results found for: SPEP, UPEP  Lab Results  Component Value Date   WBC 3.7 07/09/2017   NEUTROABS 2.5 07/09/2017   HGB 11.5 (L) 07/09/2017   HCT 34.1 (L) 07/09/2017   MCV 83.2 07/09/2017   PLT 67 (L) 07/09/2017      Chemistry      Component Value Date/Time   NA 140 07/09/2017 0848   NA 140 03/08/2017 1223   NA 144 07/25/2014 0444   K 3.2 (L) 07/09/2017 0848   K 3.7 07/25/2014 0444   CL 105 07/09/2017 0848   CL 107 07/25/2014 0444   CO2 29 07/09/2017 0848   CO2 30 07/25/2014 0444   BUN 12 07/09/2017 0848   BUN 13 03/08/2017 1223   BUN 8 07/25/2014 0444   CREATININE 0.69 07/09/2017 0848   CREATININE 0.69 07/25/2014 0444      Component Value Date/Time   CALCIUM 9.0 07/09/2017 0848   CALCIUM 8.4 (L) 07/25/2014 0444   ALKPHOS 96 07/09/2017 0848   ALKPHOS 189 (H) 07/25/2014 0444   AST 27 07/09/2017 0848   AST 96 (H) 07/25/2014 0444   ALT 13 (L) 07/09/2017 0848   ALT 123 (H) 07/25/2014 0444   BILITOT 0.4 07/09/2017 0848   BILITOT 0.2 03/08/2017 1223   BILITOT 2.0 (H) 07/25/2014 0444     IMPRESSION: 1. Ill-defined circumferential wall thickening in the distal rectum with perirectal edema/inflammation. The soft tissue nodule contiguous with the right aspect of the rectum on the prior study, likely representing direct distention, has decreased in the interval. Small perirectal lymph nodes persist. 2. No evidence for metastatic disease elsewhere in the abdomen or pelvis   Electronically Signed   By: Kennith Center M.D.   On: 11/15/2016 17:23  RADIOGRAPHIC STUDIES: I have personally reviewed the radiological images as listed and agreed with the findings in the report. No results found.   ASSESSMENT & PLAN:  Rectal cancer metastasized to intrapelvic lymph node  ([facility]) # Rectal  Cancer- stage  III- s/p neoadjuvant chemoradiation with 5-FU continuous infusion with radiation; s/p APR April 7th 2018;  On adjuvant FOLFOX. Patient tolerating chemotherapy fairly well without major side effects-noted below except thrombocytopenia  # HOLD cycle  #5 today.Labs today reviewed;  Sec to low platelets- 67; will decrease the dose of oxaliplatin to '70mg'$ /m2; and discontinue bolus 5 FU.   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 Monitor for now.  # Nausea- G-1- continue antiemetics.  # Hypokalemia- 3.2 continue 89mq/day. Discussed regarding dietary supp.   # Thrombocytopenia- 67; sec to ox; will decrease the dose.   # follow up in 2 weeks/labs/MD; again in 4 weeks.    No orders of the defined types were placed in this encounter.      GCammie Sickle MD 07/09/2017 11:20 AM

## 2017-07-11 ENCOUNTER — Inpatient Hospital Stay: Payer: Medicaid Other

## 2017-07-23 ENCOUNTER — Inpatient Hospital Stay: Payer: Medicaid Other

## 2017-07-23 ENCOUNTER — Inpatient Hospital Stay: Payer: Medicaid Other | Attending: Internal Medicine | Admitting: Internal Medicine

## 2017-07-23 ENCOUNTER — Other Ambulatory Visit: Payer: Self-pay | Admitting: *Deleted

## 2017-07-23 VITALS — BP 137/88 | HR 66 | Temp 97.6°F | Resp 20 | Ht 66.0 in | Wt 142.5 lb

## 2017-07-23 DIAGNOSIS — D696 Thrombocytopenia, unspecified: Secondary | ICD-10-CM | POA: Diagnosis not present

## 2017-07-23 DIAGNOSIS — E041 Nontoxic single thyroid nodule: Secondary | ICD-10-CM | POA: Insufficient documentation

## 2017-07-23 DIAGNOSIS — Z79899 Other long term (current) drug therapy: Secondary | ICD-10-CM

## 2017-07-23 DIAGNOSIS — Z87440 Personal history of urinary (tract) infections: Secondary | ICD-10-CM | POA: Diagnosis not present

## 2017-07-23 DIAGNOSIS — Z923 Personal history of irradiation: Secondary | ICD-10-CM | POA: Diagnosis not present

## 2017-07-23 DIAGNOSIS — C20 Malignant neoplasm of rectum: Secondary | ICD-10-CM

## 2017-07-23 DIAGNOSIS — E876 Hypokalemia: Secondary | ICD-10-CM | POA: Insufficient documentation

## 2017-07-23 DIAGNOSIS — F17210 Nicotine dependence, cigarettes, uncomplicated: Secondary | ICD-10-CM

## 2017-07-23 DIAGNOSIS — Z5111 Encounter for antineoplastic chemotherapy: Secondary | ICD-10-CM | POA: Insufficient documentation

## 2017-07-23 DIAGNOSIS — Z8673 Personal history of transient ischemic attack (TIA), and cerebral infarction without residual deficits: Secondary | ICD-10-CM | POA: Insufficient documentation

## 2017-07-23 DIAGNOSIS — I2510 Atherosclerotic heart disease of native coronary artery without angina pectoris: Secondary | ICD-10-CM | POA: Insufficient documentation

## 2017-07-23 DIAGNOSIS — C775 Secondary and unspecified malignant neoplasm of intrapelvic lymph nodes: Principal | ICD-10-CM

## 2017-07-23 DIAGNOSIS — I10 Essential (primary) hypertension: Secondary | ICD-10-CM | POA: Insufficient documentation

## 2017-07-23 LAB — COMPREHENSIVE METABOLIC PANEL
ALBUMIN: 3.5 g/dL (ref 3.5–5.0)
ALT: 11 U/L — AB (ref 14–54)
AST: 22 U/L (ref 15–41)
Alkaline Phosphatase: 100 U/L (ref 38–126)
Anion gap: 7 (ref 5–15)
BUN: 14 mg/dL (ref 6–20)
CHLORIDE: 105 mmol/L (ref 101–111)
CO2: 27 mmol/L (ref 22–32)
CREATININE: 0.63 mg/dL (ref 0.44–1.00)
Calcium: 9.1 mg/dL (ref 8.9–10.3)
GFR calc Af Amer: >60 mL/min (ref >60)
GLUCOSE: 80 mg/dL (ref 65–99)
Potassium: 4 mmol/L (ref 3.5–5.1)
SODIUM: 139 mmol/L (ref 135–145)
Total Bilirubin: 0.4 mg/dL (ref 0.3–1.2)
Total Protein: 6.9 g/dL (ref 6.5–8.1)

## 2017-07-23 LAB — CBC WITH DIFFERENTIAL/PLATELET
Basophils Absolute: 0.1 10*3/uL (ref 0–0.1)
Basophils Relative: 1 %
EOS ABS: 0.1 10*3/uL (ref 0–0.7)
EOS PCT: 2 %
HCT: 33.5 % — ABNORMAL LOW (ref 35.0–47.0)
Hemoglobin: 11.3 g/dL — ABNORMAL LOW (ref 12.0–16.0)
LYMPHS ABS: 0.7 10*3/uL — AB (ref 1.0–3.6)
Lymphocytes Relative: 12 %
MCH: 28.8 pg (ref 26.0–34.0)
MCHC: 33.8 g/dL (ref 32.0–36.0)
MCV: 85.3 fL (ref 80.0–100.0)
MONOS PCT: 10 %
Monocytes Absolute: 0.6 10*3/uL (ref 0.2–0.9)
Neutro Abs: 4.7 10*3/uL (ref 1.4–6.5)
Neutrophils Relative %: 75 %
PLATELETS: 114 10*3/uL — AB (ref 150–440)
RBC: 3.93 MIL/uL (ref 3.80–5.20)
RDW: 21.4 % — ABNORMAL HIGH (ref 11.5–14.5)
WBC: 6.2 10*3/uL (ref 3.6–11.0)

## 2017-07-23 MED ORDER — DEXAMETHASONE SODIUM PHOSPHATE 10 MG/ML IJ SOLN
10.0000 mg | Freq: Once | INTRAMUSCULAR | Status: AC
  Administered 2017-07-23: 10 mg via INTRAVENOUS
  Filled 2017-07-23: qty 1

## 2017-07-23 MED ORDER — SODIUM CHLORIDE 0.9 % IV SOLN
2400.0000 mg/m2 | INTRAVENOUS | Status: DC
  Administered 2017-07-23: 4100 mg via INTRAVENOUS
  Filled 2017-07-23: qty 82

## 2017-07-23 MED ORDER — PALONOSETRON HCL INJECTION 0.25 MG/5ML
0.2500 mg | Freq: Once | INTRAVENOUS | Status: AC
  Administered 2017-07-23: 0.25 mg via INTRAVENOUS
  Filled 2017-07-23: qty 5

## 2017-07-23 MED ORDER — OXALIPLATIN CHEMO INJECTION 100 MG/20ML
70.0000 mg/m2 | Freq: Once | INTRAVENOUS | Status: AC
  Administered 2017-07-23: 120 mg via INTRAVENOUS
  Filled 2017-07-23: qty 20

## 2017-07-23 MED ORDER — LEUCOVORIN CALCIUM INJECTION 350 MG
700.0000 mg | Freq: Once | INTRAMUSCULAR | Status: AC
  Administered 2017-07-23: 700 mg via INTRAVENOUS
  Filled 2017-07-23: qty 35

## 2017-07-23 MED ORDER — DEXTROSE 5 % IV SOLN
Freq: Once | INTRAVENOUS | Status: AC
  Administered 2017-07-23: 10:00:00 via INTRAVENOUS
  Filled 2017-07-23: qty 1000

## 2017-07-23 NOTE — Progress Notes (Signed)
[place] OFFICE PROGRESS NOTE  Patient Care Team: Lynnell Jude, MD as PCP - General (Family Medicine) Christene Lye, MD (General Surgery)  Cancer Staging No matching staging information was found for the patient.   Oncology History   # SEP 2017- RECTAL CA mod diff adeno; STAGE III [no EUS; positive ~77m LN; Dr.sankar] CEA-7; Sep 21st START 5FU-RT [finished NOV 2017]; April 7th APR- 2018- [id number] [5LN]; positive for LVI/PNI [poor response to chemo]; Margins-Negative.  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  # smoker/ TIA [aug 2017- Plavix]  # Will order MMR/Mol testing on surgical resection  # Multiple family members-malignancy- ordered- MMR on surgical resection specimen.          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  female patient with Above history of rectal cancer currently on adjuvant chemotherapy-with FOLFOX. Patient is currently status post 4 cycles.  Patient cycle 5 was held 2 weeks ago because of thrombocytopenia.  Patient denies any significant tingling or numbness. No vomiting. No diarrhea. No sores in the mouth.  Denies any nosebleeds or gum bleeding.  No fevers or chills. Otherwise no chest pain or shortness of breath or cough.  REVIEW OF SYSTEMS:  A complete 10 point review of system is done which is negative except mentioned above/history of present illness.   PAST MEDICAL HISTORY :  Past Medical History:  Diagnosis Date  . Arthritis   . Body mass index (BMI) of 24.0-24.9 in adult   . Cardiovascular disease   . Current every day smoker    a. ~35 years - > has cut down to < 3/4 ppd  . Headache   . History of UTI   . Hypertension   .  Midsternal chest pain    a. 05/2014 Stress Echo: Ex time: 8:09, ECG w/o acute changes, EF nl with possible mid and apical anterior HK-->cath recommended.  . Port-a-cath in place    RIGHT SIDE  . [place]  . [place]    [place] no neurology followup now seen by pcp  . Syncope and collapse   . Thyroid nodule 2018   FINDINGS CONSISTENT WITH A HURTHLE CELL LESION AND/OR NEOPLASM (BETHESDA CATEGORY IV).    PAST SURGICAL HISTORY :   Past Surgical History:  Procedure Laterality Date  . ABDOMINAL HYSTERECTOMY    . ABDOMINAL PERINEAL BOWEL RESECTION N/A 03/16/2017   Procedure: ABDOMINAL PERINEAL RESECTION;  Surgeon: SChristene Lye MD;  Location: [facility] OR[facility];  Service: General;  Laterality: N/A;  . APPENDECTOMY  03/16/2017   Procedure: APPENDECTOMY;  Surgeon: SChristene Lye MD;  Location: [facility] OR[facility];  Service: General;;  . BACK SURGERY     l 3,4,5  . BIOPSY THYROID Right 11/16/2016   FINDINGS CONSISTENT WITH A HURTHLE CELL LESION AND/OR NEOPLASM (BETHESDA CATEGORY IV).  .Marland KitchenCHOLECYSTECTOMY    . COLONOSCOPY WITH PROPOFOL N/A 08/15/2016   Procedure: COLONOSCOPY WITH PROPOFOL;  Surgeon: MLollie Sails MD;  [facility];  Service: Endoscopy;  Laterality: N/A;  . COLONOSCOPY WITH PROPOFOL N/A 11/08/2016   Procedure: COLONOSCOPY WITH PROPOFOL;  Surgeon: SChristene Lye MD;  Location: [facility] ENDOS[facility];  Service: Endoscopy;  Laterality: N/A;  . COLOSTOMY N/A  11/23/2016   Procedure: SIGMOID COLOSTOMY;  Surgeon: Christene Lye, MD;  Location: [facility];  Service: General;  Laterality: N/A;  . ERCP    . LAPAROSCOPY N/A 11/23/2016   Procedure: LAPAROSCOPY DIAGNOSTIC;  Surgeon: Christene Lye, MD;  Location: [facility];  Service: General;  Laterality: N/A;  . PARTIAL HYSTERECTOMY    . PORTACATH PLACEMENT N/A 08/18/2016   Procedure: INSERTION PORT-A-CATH;  Surgeon: Christene Lye, MD;  Location: [facility];  Service: General;  Laterality: N/A;     FAMILY HISTORY :  Mom- gall bladder ca; breast cancer- 37s; mat grand ma- stomach cancer- 67s; sblings- 2 no cancers; 2 daughters- one daughter? Cervical  Family History  Problem Relation Age of Onset  . Breast cancer Mother   . Hyperlipidemia Father   . Heart disease Paternal Grandmother   . Stroke Maternal Grandfather     SOCIAL HISTORY:   Social History  Substance Use Topics  . Smoking status: Former Smoker    Packs/day: 0.25    Years: 35.00    Types: Cigarettes    Quit date: 08/11/2016  . Smokeless tobacco: Never Used  . Alcohol use No     Comment: occasional    ALLERGIES:  is allergic to prednisone.  MEDICATIONS:  Current Outpatient Prescriptions  Medication Sig Dispense Refill  . clopidogrel (PLAVIX) 75 MG tablet Take 75 mg by mouth every evening.     [name] lisinopril (PRINIVIL,ZESTRIL) 10 MG tablet Take 5 mg by mouth every morning.     . potassium chloride SA (K-DUR,KLOR-CON) 20 MEQ tablet TAKE ONE TABLET BY MOUTH TWICE DAILY 60 tablet 2  . traMADol (ULTRAM) 50 MG tablet Take 50 mg by mouth every 6 (six) hours as needed (for pain.).     [name] venlafaxine XR (EFFEXOR-XR) 37.5 MG 24 hr capsule TAKE ONE CAPSULE BY MOUTH EVERY MORNING WITH BREAKFAST 30 capsule 0  . diphenoxylate-atropine (LOMOTIL) 2.5-0.025 MG tablet Take 1 tablet by mouth 4 (four) times daily as needed for diarrhea or loose stools. Take it along with immodium (Patient not taking: Reported on 07/23/2017) 30 tablet 0  . nitroGLYCERIN (NITROSTAT) 0.4 MG SL tablet Place 1 tablet (0.4 mg total) under the tongue every 5 (five) minutes as needed for chest pain. (Patient not taking: Reported on 07/23/2017) 25 tablet 6  . oxyCODONE (OXY IR/ROXICODONE) 5 MG immediate release tablet Take 1-2 tablets (5-10 mg total) by mouth every 4 (four) hours as needed for moderate pain. (Patient not taking: Reported on 07/23/2017) 30 tablet 0  . prochlorperazine (COMPAZINE) 10 MG tablet Take 1 tablet by mouth 4 (four) times daily as needed  for nausea.  2   No current facility-administered medications for this visit.    Facility-Administered Medications Ordered in Other Visits  Medication Dose Route Frequency Provider Last Rate Last Dose  . fluorouracil (ADRUCIL) 4,100 mg in sodium chloride 0.9 % 68 mL chemo infusion  2,400 mg/m2 (Treatment Plan Recorded) Intravenous 1 day or 1 dose Charlaine Dalton R, MD   4,100 mg at 07/23/17 1300    PHYSICAL EXAMINATION: ECOG PERFORMANCE STATUS: 1 - Symptomatic but completely ambulatory  BP 137/88   Pulse 66   Temp 97.6 F (36.4 C) (Tympanic)   Resp 20   Ht 5' 6" (1.676 m)   Wt 142 lb 8 oz (64.6 kg)   BMI 23.00 kg/m   Filed Weights   07/23/17 0910  Weight: 142 lb 8 oz (64.6 kg)    GENERAL: Well-nourished well-developed; Alert, no distress  and comfortable. Accompanied by her family. EYES: no pallor or icterus OROPHARYNX: no thrush or ulceration; good dentition  NECK: supple, no masses felt LYMPH:  no palpable lymphadenopathy in the cervical, axillary or inguinal regions LUNGS: clear to auscultation and  No wheeze or crackles HEART/CVS: regular rate & rhythm and no murmurs; No lower extremity edema ABDOMEN:abdomen soft, non-tender and normal bowel sounds Musculoskeletal:no cyanosis of digits and no clubbing  PSYCH: alert & oriented x 3; flat affect NEURO: no focal motor/sensory deficits SKIN:  no rashes or significant lesions  LABORATORY DATA:  I have reviewed the data as listed    Component Value Date/Time   NA 139 07/23/2017 0853   NA 140 03/08/2017 1223   NA 144 07/25/2014 0444   K 4.0 07/23/2017 0853   K 3.7 07/25/2014 0444   CL 105 07/23/2017 0853   CL 107 07/25/2014 0444   CO2 27 07/23/2017 0853   CO2 30 07/25/2014 0444   GLUCOSE 80 07/23/2017 0853   GLUCOSE 87 07/25/2014 0444   BUN 14 07/23/2017 0853   BUN 13 03/08/2017 1223   BUN 8 07/25/2014 0444   CREATININE 0.63 07/23/2017 0853   CREATININE 0.69 07/25/2014 0444   CALCIUM 9.1 07/23/2017 0853    CALCIUM 8.4 (L) 07/25/2014 0444   PROT 6.9 07/23/2017 0853   PROT 6.8 03/08/2017 1223   PROT 6.5 07/25/2014 0444   ALBUMIN 3.5 07/23/2017 0853   ALBUMIN 4.2 03/08/2017 1223   ALBUMIN 2.6 (L) 07/25/2014 0444   AST 22 07/23/2017 0853   AST 96 (H) 07/25/2014 0444   ALT 11 (L) 07/23/2017 0853   ALT 123 (H) 07/25/2014 0444   ALKPHOS 100 07/23/2017 0853   ALKPHOS 189 (H) 07/25/2014 0444   BILITOT 0.4 07/23/2017 0853   BILITOT 0.2 03/08/2017 1223   BILITOT 2.0 (H) 07/25/2014 0444   GFRNONAA >60 07/23/2017 0853   GFRNONAA >60 07/25/2014 0444   GFRAA >60 07/23/2017 0853   GFRAA >60 07/25/2014 0444    No results found for: SPEP, UPEP  Lab Results  Component Value Date   WBC 6.2 07/23/2017   NEUTROABS 4.7 07/23/2017   HGB 11.3 (L) 07/23/2017   HCT 33.5 (L) 07/23/2017   MCV 85.3 07/23/2017   PLT 114 (L) 07/23/2017      Chemistry      Component Value Date/Time   NA 139 07/23/2017 0853   NA 140 03/08/2017 1223   NA 144 07/25/2014 0444   K 4.0 07/23/2017 0853   K 3.7 07/25/2014 0444   CL 105 07/23/2017 0853   CL 107 07/25/2014 0444   CO2 27 07/23/2017 0853   CO2 30 07/25/2014 0444   BUN 14 07/23/2017 0853   BUN 13 03/08/2017 1223   BUN 8 07/25/2014 0444   CREATININE 0.63 07/23/2017 0853   CREATININE 0.69 07/25/2014 0444      Component Value Date/Time   CALCIUM 9.1 07/23/2017 0853   CALCIUM 8.4 (L) 07/25/2014 0444   ALKPHOS 100 07/23/2017 0853   ALKPHOS 189 (H) 07/25/2014 0444   AST 22 07/23/2017 0853   AST 96 (H) 07/25/2014 0444   ALT 11 (L) 07/23/2017 0853   ALT 123 (H) 07/25/2014 0444   BILITOT 0.4 07/23/2017 0853   BILITOT 0.2 03/08/2017 1223   BILITOT 2.0 (H) 07/25/2014 0444     IMPRESSION: 1. Ill-defined circumferential wall thickening in the distal rectum with perirectal edema/inflammation. The soft tissue nodule contiguous with the right aspect of the rectum on the prior study,  likely representing direct distention, has decreased in the interval. Small  perirectal lymph nodes persist. 2. No evidence for metastatic disease elsewhere in the abdomen or pelvis   Electronically Signed   By: Misty Stanley M.D.   On: 11/15/2016 17:23  RADIOGRAPHIC STUDIES: I have personally reviewed the radiological images as listed and agreed with the findings in the report. No results found.   ASSESSMENT & PLAN:  Rectal cancer metastasized to intrapelvic lymph node ([place]) # Rectal  Cancer- stage III- s/p neoadjuvant chemoradiation with 5-FU continuous infusion with radiation; s/p APR April 7th 2018;  On adjuvant FOLFOX. Patient tolerating chemotherapy fairly well without major side effects-noted below except thrombocytopenia  # proceed with cycle  #5 today.Labs today reviewed;  Sec to low platelets- 114.  will decrease the dose of oxaliplatin to 56m/m2; and discontinue bolus 5 FU.   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 Monitor for now.  # Hypokalemia- today 4.0; improved.   # Thrombocytopenia- 114 sec to ox; will decrease the dose to 70 mg/m2.    # follow up in 2 weeks/labs/MD; again in 4 weeks.    No orders of the defined types were placed in this encounter.      GCammie Sickle MD 07/23/2017 1:25 PM

## 2017-07-23 NOTE — Assessment & Plan Note (Addendum)
#   Rectal  Cancer- stage III- s/p neoadjuvant chemoradiation with 5-FU continuous infusion with radiation; s/p APR April 7th 2018;  On adjuvant FOLFOX. Patient tolerating chemotherapy fairly well without major side effects-noted below except thrombocytopenia  # proceed with cycle  #5 today.Labs today reviewed;  Sec to low platelets- 114.  will decrease the dose of oxaliplatin to 70mg /m2; and discontinue bolus 5 FU.   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 Monitor for now.  # Hypokalemia- today 4.0; improved.   # Thrombocytopenia- 114 sec to ox; will decrease the dose to 70 mg/m2.    # follow up in 2 weeks/labs/MD; again in 4 weeks.

## 2017-07-25 ENCOUNTER — Inpatient Hospital Stay: Payer: Medicaid Other

## 2017-07-25 VITALS — BP 124/84 | HR 69 | Temp 97.4°F | Resp 20

## 2017-07-25 DIAGNOSIS — C775 Secondary and unspecified malignant neoplasm of intrapelvic lymph nodes: Principal | ICD-10-CM

## 2017-07-25 DIAGNOSIS — Z5111 Encounter for antineoplastic chemotherapy: Secondary | ICD-10-CM | POA: Diagnosis not present

## 2017-07-25 DIAGNOSIS — C20 Malignant neoplasm of rectum: Secondary | ICD-10-CM

## 2017-07-25 MED ORDER — HEPARIN SOD (PORK) LOCK FLUSH 100 UNIT/ML IV SOLN
500.0000 [IU] | Freq: Once | INTRAVENOUS | Status: AC | PRN
  Administered 2017-07-25: 500 [IU]
  Filled 2017-07-25: qty 5

## 2017-07-25 MED ORDER — SODIUM CHLORIDE 0.9% FLUSH
10.0000 mL | INTRAVENOUS | Status: DC | PRN
  Administered 2017-07-25: 10 mL
  Filled 2017-07-25: qty 10

## 2017-08-06 ENCOUNTER — Ambulatory Visit
Admission: RE | Admit: 2017-08-06 | Discharge: 2017-08-06 | Disposition: A | Discharge status: Home / Self Care | Payer: Medicaid Other | Source: Ambulatory Visit | Attending: Internal Medicine | Admitting: Internal Medicine

## 2017-08-06 ENCOUNTER — Inpatient Hospital Stay (HOSPITAL_BASED_OUTPATIENT_CLINIC_OR_DEPARTMENT_OTHER): Payer: Medicaid Other | Admitting: Internal Medicine

## 2017-08-06 ENCOUNTER — Inpatient Hospital Stay: Payer: Medicaid Other

## 2017-08-06 DIAGNOSIS — R509 Fever, unspecified: Secondary | ICD-10-CM | POA: Insufficient documentation

## 2017-08-06 DIAGNOSIS — C20 Malignant neoplasm of rectum: Secondary | ICD-10-CM

## 2017-08-06 DIAGNOSIS — Z79899 Other long term (current) drug therapy: Secondary | ICD-10-CM | POA: Diagnosis not present

## 2017-08-06 DIAGNOSIS — C775 Secondary and unspecified malignant neoplasm of intrapelvic lymph nodes: Secondary | ICD-10-CM | POA: Diagnosis not present

## 2017-08-06 DIAGNOSIS — F17210 Nicotine dependence, cigarettes, uncomplicated: Secondary | ICD-10-CM

## 2017-08-06 DIAGNOSIS — Z5111 Encounter for antineoplastic chemotherapy: Secondary | ICD-10-CM | POA: Diagnosis not present

## 2017-08-06 DIAGNOSIS — E876 Hypokalemia: Secondary | ICD-10-CM | POA: Diagnosis not present

## 2017-08-06 DIAGNOSIS — D696 Thrombocytopenia, unspecified: Secondary | ICD-10-CM | POA: Diagnosis not present

## 2017-08-06 DIAGNOSIS — I700 Atherosclerosis of aorta: Secondary | ICD-10-CM | POA: Insufficient documentation

## 2017-08-06 DIAGNOSIS — R05 Cough: Secondary | ICD-10-CM | POA: Diagnosis not present

## 2017-08-06 DIAGNOSIS — E041 Nontoxic single thyroid nodule: Secondary | ICD-10-CM

## 2017-08-06 DIAGNOSIS — Z923 Personal history of irradiation: Secondary | ICD-10-CM

## 2017-08-06 LAB — URINALYSIS, COMPLETE (UACMP) WITH MICROSCOPIC
BACTERIA UA: NONE SEEN
BILIRUBIN URINE: NEGATIVE
Glucose, UA: NEGATIVE mg/dL
KETONES UR: NEGATIVE mg/dL
LEUKOCYTES UA: NEGATIVE
NITRITE: NEGATIVE
Protein, ur: NEGATIVE mg/dL
SPECIFIC GRAVITY, URINE: 1.014 (ref 1.005–1.030)
pH: 5 (ref 5.0–8.0)

## 2017-08-06 LAB — COMPREHENSIVE METABOLIC PANEL
ALBUMIN: 3.8 g/dL (ref 3.5–5.0)
ALT: 15 U/L (ref 14–54)
ANION GAP: 8 (ref 5–15)
AST: 26 U/L (ref 15–41)
Alkaline Phosphatase: 113 U/L (ref 38–126)
BILIRUBIN TOTAL: 0.5 mg/dL (ref 0.3–1.2)
BUN: 9 mg/dL (ref 6–20)
CO2: 28 mmol/L (ref 22–32)
Calcium: 8.8 mg/dL — ABNORMAL LOW (ref 8.9–10.3)
Chloride: 102 mmol/L (ref 101–111)
Creatinine, Ser: 0.63 mg/dL (ref 0.44–1.00)
Glucose, Bld: 85 mg/dL (ref 65–99)
POTASSIUM: 3.1 mmol/L — AB (ref 3.5–5.1)
Sodium: 138 mmol/L (ref 135–145)
TOTAL PROTEIN: 7.2 g/dL (ref 6.5–8.1)

## 2017-08-06 LAB — CBC WITH DIFFERENTIAL/PLATELET
BASOS PCT: 1 %
Basophils Absolute: 0 10*3/uL (ref 0–0.1)
Eosinophils Absolute: 0.1 10*3/uL (ref 0–0.7)
Eosinophils Relative: 2 %
HEMATOCRIT: 36.8 % (ref 35.0–47.0)
Hemoglobin: 12.5 g/dL (ref 12.0–16.0)
Lymphocytes Relative: 11 %
Lymphs Abs: 0.6 10*3/uL — ABNORMAL LOW (ref 1.0–3.6)
MCH: 29.3 pg (ref 26.0–34.0)
MCHC: 33.9 g/dL (ref 32.0–36.0)
MCV: 86.4 fL (ref 80.0–100.0)
MONO ABS: 0.8 10*3/uL (ref 0.2–0.9)
MONOS PCT: 15 %
NEUTROS ABS: 3.9 10*3/uL (ref 1.4–6.5)
Neutrophils Relative %: 71 %
Platelets: 95 10*3/uL — ABNORMAL LOW (ref 150–440)
RBC: 4.26 MIL/uL (ref 3.80–5.20)
RDW: 19.8 % — AB (ref 11.5–14.5)
WBC: 5.5 10*3/uL (ref 3.6–11.0)

## 2017-08-06 MED ORDER — LEVOFLOXACIN 500 MG PO TABS: 500.0000 mg | ORAL_TABLET | Freq: Every day | ORAL | 0 refills | Status: DC

## 2017-08-06 NOTE — Assessment & Plan Note (Addendum)
#   Rectal  Cancer- stage III- s/p neoadjuvant chemoradiation with 5-FU continuous infusion with radiation; s/p APR April 7th 2018;  On adjuvant FOLFOX. Patient tolerating chemotherapy fairly well without major side effects-noted below except thrombocytopenia. Currently s/p 6 cycles.   # HOLD chemo today [see discussion below]; otherwise labs within normal limits except for platelets of 98.   # cough fevers- question upper respiratory tract versus lower respiratory tract infection. check cxr; ua + uc; statr levaquin 500  A day x 5 days.   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 Monitor for now.  # Hypokalemia- today 3.1; continue for now.    # Thrombocytopenia- 98 sec to ox;[continue current dose to 70 mg/m2]    # follow up in 2 weeks/labs/MD./Chemotherapy.  Addendum; chest x-ray reviewed- negative for any pneumonia/or interstitial lung disease. Recommend plan as above.

## 2017-08-06 NOTE — Progress Notes (Signed)
[facility] OFFICE PROGRESS NOTE  Patient Care Team: Lynnell Jude, MD as PCP - General (Family Medicine) Christene Lye, MD (General Surgery)  Cancer Staging No matching staging information was found for the patient.   Oncology History   # SEP 2017- RECTAL CA mod diff adeno; STAGE III [no EUS; positive ~41m LN; Dr.sankar] CEA-7; Sep 21st START 5FU-RT [finished NOV 2017]; April 7th APR- 2018- [id number] [5LN]; positive for LVI/PNI [poor response to chemo]; Margins-Negative.  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  # smoker/ TIA [aug 2017- Plavix]  # Will order MMR/Mol testing on surgical resection  # Multiple family members-malignancy- ordered- MMR on surgical resection specimen.          Rectal cancer metastas[place] to intrapelvic lymph node ([place]     INTERVAL HISTORY:  Rachel [id number].o.  female patient with Above history of rectal cancer currently on adjuvant chemotherapy-with FOLFOX. Patient is currently status post 5 cycles.  Patient noted to have cough; with yellowish phlegm; also shortness of breath over the last 3-4 days. Also having fevers up to 101 at home. Complains of diffuse body aches muscle aches. One of her grandson was sick recently.  Patient denies any significant tingling or numbness. No vomiting. No diarrhea. No sores in the mouth.  Denies any nosebleeds or gum bleeding.  No fevers or chills.  REVIEW OF SYSTEMS:  A complete 10 point review of system is done which is negative except mentioned above/history of present illness.   PAST MEDICAL HISTORY :  Past Medical History:  Diagnosis Date  . Arthritis   . Body mass index (BMI) of 24.0-24.9 in adult   . Cardiovascular disease   . Current every day smoker    a.  ~35 years - > has cut down to < 3/4 ppd  . Headache   . History of UTI   . Hypertension   . Midsternal chest pain    a. 05/2014 Stress Echo: Ex time: 8:09, ECG w/o acute changes, EF nl with possible mid and apical anterior HK-->cath recommended.  . Port-a-cath in place    RIGHT SIDE  . [place]  . [place]    07/2016 tia no neurology followup now seen by pcp  . Syncope and collapse   . Thyroid nodule 2018   FINDINGS CONSISTENT WITH A HURTHLE CELL LESION AND/OR NEOPLASM (BETHESDA CATEGORY IV).    PAST SURGICAL HISTORY :   Past Surgical History:  Procedure Laterality Date  . ABDOMINAL HYSTERECTOMY    . ABDOMINAL PERINEAL BOWEL RESECTION N/A 03/16/2017   Procedure: ABDOMINAL PERINEAL RESECTION;  Surgeon: SChristene Lye MD;  Location: [facility];  Service: General;  Laterality: N/A;  . APPENDECTOMY  03/16/2017   Procedure: APPENDECTOMY;  Surgeon: SChristene Lye MD;  Location: [facility];  Service: General;;  . BACK SURGERY     l 3,4,5  . BIOPSY THYROID Right 11/16/2016   FINDINGS CONSISTENT WITH A HURTHLE CELL LESION AND/OR NEOPLASM (BETHESDA CATEGORY IV).  .Marland KitchenCHOLECYSTECTOMY    . COLONOSCOPY WITH PROPOFOL N/A 08/15/2016   Procedure: COLONOSCOPY WITH PROPOFOL;  Surgeon: MLollie Sails MD;  [facility];  Service: Endoscopy;  Laterality: N/A;  . COLONOSCOPY WITH PROPOFOL N/A 11/08/2016   Procedure: COLONOSCOPY WITH PROPOFOL;  Surgeon:  Seeplaputhur Robinette Haines, MD;  Location: [facility] END[facility];  Service: Endoscopy;  Laterality: N/A;  . COLOSTOMY N/A 11/23/2016   Procedure: SIGMOID COLOSTOMY;  Surgeon: Christene Lye, MD;  Location: [facility];  Service: General;  Laterality: N/A;  . ERCP    . LAPAROSCOPY N/A 11/23/2016   Procedure: LAPAROSCOPY DIAGNOSTIC;  Surgeon: Christene Lye, MD;  Location: [facility];  Service: General;  Laterality: N/A;  . PARTIAL HYSTERECTOMY    . PORTACATH PLACEMENT N/A 08/18/2016   Procedure: INSERTION PORT-A-CATH;   Surgeon: Christene Lye, MD;  Location: [facility];  Service: General;  Laterality: N/A;    FAMILY HISTORY :  Mom- gall bladder ca; breast cancer- 50s; mat grand ma- stomach cancer- 12s; sblings- 2 no cancers; 2 daughters- one daughter? Cervical  Family History  Problem Relation Age of Onset  . Breast cancer Mother   . Hyperlipidemia Father   . Heart disease Paternal Grandmother   . Stroke Maternal Grandfather     SOCIAL HISTORY:   Social History  Substance Use Topics  . Smoking status: Former Smoker    Packs/day: 0.25    Years: 35.00    Types: Cigarettes    Quit date: 08/11/2016  . Smokeless tobacco: Never Used  . Alcohol use No     Comment: occasional    ALLERGIES:  is allergic to prednisone.  MEDICATIONS:  Current Outpatient Prescriptions  Medication Sig Dispense Refill  . clopidogrel (PLAVIX) 75 MG tablet Take 75 mg by mouth every evening.     [name] lisinopril (PRINIVIL,ZESTRIL) 10 MG tablet Take 5 mg by mouth every morning.     . potassium chloride SA (K-DUR,KLOR-CON) 20 MEQ tablet TAKE ONE TABLET BY MOUTH TWICE DAILY 60 tablet 2  . traMADol (ULTRAM) 50 MG tablet Take 50 mg by mouth every 6 (six) hours as needed (for pain.).     [name] venlafaxine XR (EFFEXOR-XR) 37.5 MG 24 hr capsule TAKE ONE CAPSULE BY MOUTH EVERY MORNING WITH BREAKFAST 30 capsule 0  . diphenoxylate-atropine (LOMOTIL) 2.5-0.025 MG tablet Take 1 tablet by mouth 4 (four) times daily as needed for diarrhea or loose stools. Take it along with immodium (Patient not taking: Reported on 07/23/2017) 30 tablet 0  . levofloxacin (LEVAQUIN) 500 MG tablet Take 1 tablet (500 mg total) by mouth daily. 5 tablet 0  . nitroGLYCERIN (NITROSTAT) 0.4 MG SL tablet Place 1 tablet (0.4 mg total) under the tongue every 5 (five) minutes as needed for chest pain. (Patient not taking: Reported on 07/23/2017) 25 tablet 6  . oxyCODONE (OXY IR/ROXICODONE) 5 MG immediate release tablet Take 1-2 tablets (5-10 mg total) by mouth every 4 (four)  hours as needed for moderate pain. (Patient not taking: Reported on 07/23/2017) 30 tablet 0  . prochlorperazine (COMPAZINE) 10 MG tablet Take 1 tablet by mouth 4 (four) times daily as needed for nausea.  2   No current facility-administered medications for this visit.     PHYSICAL EXAMINATION: ECOG PERFORMANCE STATUS: 1 - Symptomatic but completely ambulatory  There were no vitals taken for this visit.  There were no vitals filed for this visit.  GENERAL: Well-nourished well-developed; Alert, no distress and comfortable.She is alone. EYES: no pallor or icterus OROPHARYNX: no thrush or ulceration; good dentition  NECK: supple, no masses felt LYMPH:  no palpable lymphadenopathy in the cervical, axillary or inguinal regions LUNGS: clear to auscultation and  No wheeze or crackles HEART/CVS: regular rate & rhythm and no murmurs; No lower extremity edema ABDOMEN:abdomen soft, non-tender  and normal bowel sounds Musculoskeletal:no cyanosis of digits and no clubbing  PSYCH: alert & oriented x 3; flat affect NEURO: no focal motor/sensory deficits SKIN:  no rashes or significant lesions  LABORATORY DATA:  I have reviewed the data as listed    Component Value Date/Time   NA 138 08/06/2017 0844   NA 140 03/08/2017 1223   NA 144 07/25/2014 0444   K 3.1 (L) 08/06/2017 0844   K 3.7 07/25/2014 0444   CL 102 08/06/2017 0844   CL 107 07/25/2014 0444   CO2 28 08/06/2017 0844   CO2 30 07/25/2014 0444   GLUCOSE 85 08/06/2017 0844   GLUCOSE 87 07/25/2014 0444   BUN 9 08/06/2017 0844   BUN 13 03/08/2017 1223   BUN 8 07/25/2014 0444   CREATININE 0.63 08/06/2017 0844   CREATININE 0.69 07/25/2014 0444   CALCIUM 8.8 (L) 08/06/2017 0844   CALCIUM 8.4 (L) 07/25/2014 0444   PROT 7.2 08/06/2017 0844   PROT 6.8 03/08/2017 1223   PROT 6.5 07/25/2014 0444   ALBUMIN 3.8 08/06/2017 0844   ALBUMIN 4.2 03/08/2017 1223   ALBUMIN 2.6 (L) 07/25/2014 0444   AST 26 08/06/2017 0844   AST 96 (H) 07/25/2014  0444   ALT 15 08/06/2017 0844   ALT 123 (H) 07/25/2014 0444   ALKPHOS 113 08/06/2017 0844   ALKPHOS 189 (H) 07/25/2014 0444   BILITOT 0.5 08/06/2017 0844   BILITOT 0.2 03/08/2017 1223   BILITOT 2.0 (H) 07/25/2014 0444   GFRNONAA >60 08/06/2017 0844   GFRNONAA >60 07/25/2014 0444   GFRAA >60 08/06/2017 0844   GFRAA >60 07/25/2014 0444    No results found for: SPEP, UPEP  Lab Results  Component Value Date   WBC 5.5 08/06/2017   NEUTROABS 3.9 08/06/2017   HGB 12.5 08/06/2017   HCT 36.8 08/06/2017   MCV 86.4 08/06/2017   PLT 95 (L) 08/06/2017      Chemistry      Component Value Date/Time   NA 138 08/06/2017 0844   NA 140 03/08/2017 1223   NA 144 07/25/2014 0444   K 3.1 (L) 08/06/2017 0844   K 3.7 07/25/2014 0444   CL 102 08/06/2017 0844   CL 107 07/25/2014 0444   CO2 28 08/06/2017 0844   CO2 30 07/25/2014 0444   BUN 9 08/06/2017 0844   BUN 13 03/08/2017 1223   BUN 8 07/25/2014 0444   CREATININE 0.63 08/06/2017 0844   CREATININE 0.69 07/25/2014 0444      Component Value Date/Time   CALCIUM 8.8 (L) 08/06/2017 0844   CALCIUM 8.4 (L) 07/25/2014 0444   ALKPHOS 113 08/06/2017 0844   ALKPHOS 189 (H) 07/25/2014 0444   AST 26 08/06/2017 0844   AST 96 (H) 07/25/2014 0444   ALT 15 08/06/2017 0844   ALT 123 (H) 07/25/2014 0444   BILITOT 0.5 08/06/2017 0844   BILITOT 0.2 03/08/2017 1223   BILITOT 2.0 (H) 07/25/2014 0444     IMPRESSION: 1. Ill-defined circumferential wall thickening in the distal rectum with perirectal edema/inflammation. The soft tissue nodule contiguous with the right aspect of the rectum on the prior study, likely representing direct distention, has decreased in the interval. Small perirectal lymph nodes persist. 2. No evidence for metastatic disease elsewhere in the abdomen or pelvis   Electronically Signed   By: Misty Stanley M.D.   On: 11/15/2016 17:23  RADIOGRAPHIC STUDIES: I have personally reviewed the radiological images as listed  and agreed with the findings in  the report. Dg Chest 2 View  Result Date: 08/06/2017 CLINICAL DATA:  Cough, fever. EXAM: CHEST  2 VIEW COMPARISON:  Radiographs of August 18, 2016. FINDINGS: The heart size and mediastinal contours are within normal limits. Both lungs are clear. Atherosclerosis of thoracic aorta is noted. No pneumothorax or pleural effusion is noted. Right subclavian Port-A-Cath is unchanged in position with distal tip in expected position of SVC. The visualized skeletal structures are unremarkable. IMPRESSION: No active cardiopulmonary disease.  Aortic atherosclerosis. Electronically Signed   By: Marijo Conception, M.D.   On: 08/06/2017 10:20     ASSESSMENT & PLAN:  Rectal cancer metastasized to intrapelvic lymph node ([place]) # Rectal  Cancer- stage III- s/p neoadjuvant chemoradiation with 5-FU continuous infusion with radiation; s/p APR April 7th 2018;  On adjuvant FOLFOX. Patient tolerating chemotherapy fairly well without major side effects-noted below except thrombocytopenia. Currently s/p 6 cycles.   # HOLD chemo today [see discussion below]; otherwise labs within normal limits except for platelets of 98.   # cough fevers- question upper respiratory tract versus lower respiratory tract infection. check cxr; ua + uc; statr levaquin 500  A day x 5 days.   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 Monitor for now.  # Hypokalemia- today 3.1; continue for now.    # Thrombocytopenia- 98 sec to ox;[continue current dose to 70 mg/m2]    # follow up in 2 weeks/labs/MD./Chemotherapy.  Addendum; chest x-ray reviewed- negative for any pneumonia/or interstitial lung disease. Recommend plan as above.    Orders Placed This Encounter  Procedures  . Urine Culture    Standing Status:   Future    Number of Occurrences:   1    Standing Expiration Date:   08/07/2017  . DG Chest 2 View    Standing Status:   Future    Number of Occurrences:   1    Standing Expiration Date:    10/06/2018    Order Specific Question:   Reason for Exam (SYMPTOM  OR DIAGNOSIS REQUIRED)    Answer:   cough; fever    Order Specific Question:   Preferred imaging location?    Answer:   [facility]  . Urinalysis, Complete w Microscopic    Standing Status:   Future    Number of Occurrences:   1    Standing Expiration Date:   08/07/2017       Cammie Sickle, MD 08/06/2017 1:42 PM

## 2017-08-06 NOTE — Progress Notes (Signed)
Patient presents to clinic today with c/o sore throat, post nasal drip, itchy water eyes, and reports a fever of 100.1 (yesterday at noon). Pt took 1 tylenol and has not checked her temp since the initial onset of fever. Temp today at 97.0 - tympanic. Pt reports fatigue and overall body aches.

## 2017-08-07 LAB — URINE CULTURE

## 2017-08-08 ENCOUNTER — Inpatient Hospital Stay: Payer: Medicaid Other

## 2017-08-20 ENCOUNTER — Inpatient Hospital Stay: Payer: Medicaid Other | Attending: Internal Medicine | Admitting: Internal Medicine

## 2017-08-20 ENCOUNTER — Inpatient Hospital Stay: Payer: Medicaid Other

## 2017-08-20 VITALS — BP 144/91 | HR 74 | Resp 16 | Ht 66.0 in | Wt 148.2 lb

## 2017-08-20 VITALS — Temp 97.1°F

## 2017-08-20 DIAGNOSIS — Z87891 Personal history of nicotine dependence: Secondary | ICD-10-CM | POA: Diagnosis not present

## 2017-08-20 DIAGNOSIS — Z7902 Long term (current) use of antithrombotics/antiplatelets: Secondary | ICD-10-CM | POA: Insufficient documentation

## 2017-08-20 DIAGNOSIS — Z8052 Family history of malignant neoplasm of bladder: Secondary | ICD-10-CM

## 2017-08-20 DIAGNOSIS — C775 Secondary and unspecified malignant neoplasm of intrapelvic lymph nodes: Secondary | ICD-10-CM | POA: Diagnosis not present

## 2017-08-20 DIAGNOSIS — E876 Hypokalemia: Secondary | ICD-10-CM | POA: Diagnosis not present

## 2017-08-20 DIAGNOSIS — Z87440 Personal history of urinary (tract) infections: Secondary | ICD-10-CM | POA: Insufficient documentation

## 2017-08-20 DIAGNOSIS — D696 Thrombocytopenia, unspecified: Secondary | ICD-10-CM | POA: Insufficient documentation

## 2017-08-20 DIAGNOSIS — E041 Nontoxic single thyroid nodule: Secondary | ICD-10-CM | POA: Insufficient documentation

## 2017-08-20 DIAGNOSIS — C20 Malignant neoplasm of rectum: Secondary | ICD-10-CM

## 2017-08-20 DIAGNOSIS — I10 Essential (primary) hypertension: Secondary | ICD-10-CM | POA: Diagnosis not present

## 2017-08-20 DIAGNOSIS — M1990 Unspecified osteoarthritis, unspecified site: Secondary | ICD-10-CM | POA: Diagnosis not present

## 2017-08-20 DIAGNOSIS — Z800 Family history of malignant neoplasm of digestive organs: Secondary | ICD-10-CM

## 2017-08-20 DIAGNOSIS — Z79899 Other long term (current) drug therapy: Secondary | ICD-10-CM | POA: Diagnosis not present

## 2017-08-20 DIAGNOSIS — Z923 Personal history of irradiation: Secondary | ICD-10-CM | POA: Insufficient documentation

## 2017-08-20 DIAGNOSIS — Z8673 Personal history of transient ischemic attack (TIA), and cerebral infarction without residual deficits: Secondary | ICD-10-CM | POA: Diagnosis not present

## 2017-08-20 DIAGNOSIS — Z5111 Encounter for antineoplastic chemotherapy: Secondary | ICD-10-CM | POA: Insufficient documentation

## 2017-08-20 DIAGNOSIS — Z803 Family history of malignant neoplasm of breast: Secondary | ICD-10-CM

## 2017-08-20 LAB — CBC WITH DIFFERENTIAL/PLATELET
BASOS ABS: 0.1 10*3/uL (ref 0–0.1)
Basophils Relative: 1 %
Eosinophils Absolute: 0.1 10*3/uL (ref 0–0.7)
Eosinophils Relative: 1 %
HEMATOCRIT: 34.2 % — AB (ref 35.0–47.0)
Hemoglobin: 11.7 g/dL — ABNORMAL LOW (ref 12.0–16.0)
LYMPHS PCT: 10 %
Lymphs Abs: 0.7 10*3/uL — ABNORMAL LOW (ref 1.0–3.6)
MCH: 29.6 pg (ref 26.0–34.0)
MCHC: 34.2 g/dL (ref 32.0–36.0)
MCV: 86.4 fL (ref 80.0–100.0)
MONO ABS: 0.6 10*3/uL (ref 0.2–0.9)
Monocytes Relative: 9 %
NEUTROS ABS: 5.6 10*3/uL (ref 1.4–6.5)
Neutrophils Relative %: 79 %
PLATELETS: 144 10*3/uL — AB (ref 150–440)
RBC: 3.96 MIL/uL (ref 3.80–5.20)
RDW: 19.1 % — AB (ref 11.5–14.5)
WBC: 7.1 10*3/uL (ref 3.6–11.0)

## 2017-08-20 LAB — COMPREHENSIVE METABOLIC PANEL
ALBUMIN: 3.4 g/dL — AB (ref 3.5–5.0)
ALT: 13 U/L — AB (ref 14–54)
AST: 25 U/L (ref 15–41)
Alkaline Phosphatase: 117 U/L (ref 38–126)
Anion gap: 7 (ref 5–15)
BUN: 15 mg/dL (ref 6–20)
CHLORIDE: 104 mmol/L (ref 101–111)
CO2: 26 mmol/L (ref 22–32)
CREATININE: 0.71 mg/dL (ref 0.44–1.00)
Calcium: 8.9 mg/dL (ref 8.9–10.3)
GFR calc Af Amer: >60 mL/min (ref >60)
GLUCOSE: 105 mg/dL — AB (ref 65–99)
POTASSIUM: 3.6 mmol/L (ref 3.5–5.1)
Sodium: 137 mmol/L (ref 135–145)
Total Bilirubin: 0.4 mg/dL (ref 0.3–1.2)
Total Protein: 7 g/dL (ref 6.5–8.1)

## 2017-08-20 MED ORDER — PALONOSETRON HCL INJECTION 0.25 MG/5ML
0.2500 mg | Freq: Once | INTRAVENOUS | Status: AC
  Administered 2017-08-20: 0.25 mg via INTRAVENOUS
  Filled 2017-08-20: qty 5

## 2017-08-20 MED ORDER — OXALIPLATIN CHEMO INJECTION 100 MG/20ML
70.0000 mg/m2 | Freq: Once | INTRAVENOUS | Status: AC
  Administered 2017-08-20: 120 mg via INTRAVENOUS
  Filled 2017-08-20: qty 20

## 2017-08-20 MED ORDER — DEXTROSE 5 % IV SOLN
Freq: Once | INTRAVENOUS | Status: AC
  Administered 2017-08-20: 10:00:00 via INTRAVENOUS
  Filled 2017-08-20: qty 1000

## 2017-08-20 MED ORDER — SODIUM CHLORIDE 0.9 % IV SOLN
2400.0000 mg/m2 | INTRAVENOUS | Status: DC
  Administered 2017-08-20: 4100 mg via INTRAVENOUS
  Filled 2017-08-20: qty 82

## 2017-08-20 MED ORDER — DEXAMETHASONE SODIUM PHOSPHATE 10 MG/ML IJ SOLN
10.0000 mg | Freq: Once | INTRAMUSCULAR | Status: AC
  Administered 2017-08-20: 10 mg via INTRAVENOUS
  Filled 2017-08-20: qty 1

## 2017-08-20 MED ORDER — DEXTROSE 5 % IV SOLN
700.0000 mg | Freq: Once | INTRAVENOUS | Status: AC
  Administered 2017-08-20: 700 mg via INTRAVENOUS
  Filled 2017-08-20: qty 35

## 2017-08-20 NOTE — Assessment & Plan Note (Addendum)
#   Rectal  Cancer- stage III- s/p neoadjuvant chemoradiation with 5-FU continuous infusion with radiation; s/p APR April 7th 2018;  On adjuvant FOLFOX. Patient tolerating chemotherapy fairly well without major side effects-noted below except thrombocytopenia. Currently s/p 5 cycles.   # proceed with cycle #6 today; Labs today reviewed;  acceptable for treatment today. Total of 10 ; wil get scan after adjuvant chemo.   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 Monitor for now.  # Hypokalemia- today 3.6; monitor for now.; continue for now.    # Thrombocytopenia- 144 sec to ox;[continue current dose to 70 mg/m2]    # follow up in 2 weeks/labs/MD./Chemotherapy.

## 2017-08-20 NOTE — Progress Notes (Signed)
Patient is here today for a follow up. Patient states no new concerns today.  

## 2017-08-20 NOTE — Progress Notes (Signed)
[name] OFFICE PROGRESS NOTE  Patient Care Team: Bliss, Laura K, MD as PCP - General (Family Medicine) [name], [name], MD (General Surgery)  Cancer Staging No matching staging information was found for the patient.   Oncology History   # SEP 2017- RECTAL CA mod diff adeno; STAGE III [no EUS; positive ~7mm LN; Dr.sankar] CEA-7; Sep 21st START 5FU-RT [finished NOV 2017]; April 7th APR- 2018- ypT3ypN0 [5LN]; positive for LVI/PNI [poor response to chemo]; Margins-Negative.  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  female patient with Above history of rectal cancer currently on adjuvant chemotherapy-with FOLFOX. Patient is currently status post 5 cycles.  Chemotherapy was held last week because of acute bronchitis. Status post Levaquin symptoms resolved.  Patient denies any significant tingling or numbness. No vomiting. No diarrhea. No sores in the mouth.  Denies any nosebleeds or gum bleeding.  No fevers or chills.  REVIEW OF SYSTEMS:  A complete 10 point review of system is done which is negative except mentioned above/history of present illness.   PAST MEDICAL HISTORY :  Past Medical History:  Diagnosis Date  . Arthritis   . Body mass index (BMI) of 24.0-24.9 in adult   . Cardiovascular disease   . Current every day smoker    a. ~35 years - > has cut down to < 3/4 ppd  . Headache   . History of UTI   . Hypertension   . Midsternal chest pain    a. 05/2014 Stress Echo: Ex time: 8:09, ECG w/o acute changes, EF nl with possible mid and  apical anterior HK-->cath recommended.  . Port-a-cath in place    RIGHT SIDE  . Rectal cancer (HCC) 2017  . Stroke (HCC)    07/2016 tia no neurology followup now seen by pcp  . Syncope and collapse   . Thyroid nodule 2018   FINDINGS CONSISTENT WITH A HURTHLE CELL LESION AND/OR NEOPLASM (BETHESDA CATEGORY IV).    PAST SURGICAL HISTORY :   Past Surgical History:  Procedure Laterality Date  . ABDOMINAL HYSTERECTOMY    . ABDOMINAL PERINEAL BOWEL RESECTION N/A 03/16/2017   Procedure: ABDOMINAL PERINEAL RESECTION;  Surgeon: [name] [name], MD;  Location: [facility];  Service: General;  Laterality: N/A;  . APPENDECTOMY  03/16/2017   Procedure: APPENDECTOMY;  Surgeon: [name] [name], MD;  Location: [facility];  Service: General;;  . BACK SURGERY     l 3,4,5  . BIOPSY THYROID Right 11/16/2016   FINDINGS CONSISTENT WITH A HURTHLE CELL LESION AND/OR NEOPLASM (BETHESDA CATEGORY IV).  . CHOLECYSTECTOMY    . COLONOSCOPY WITH PROPOFOL N/A 08/15/2016   Procedure: COLONOSCOPY WITH PROPOFOL;  Surgeon: Martin U Skulskie, MD;  Location: [facility];  Service: Endoscopy;  Laterality: N/A;  . COLONOSCOPY WITH PROPOFOL N/A 11/08/2016   Procedure: COLONOSCOPY WITH PROPOFOL;  Surgeon: [name] [name], MD;  Location: [facility];  Service: Endoscopy;  Laterality: N/A;  . COLOSTOMY N/A 11/23/2016   Procedure: SIGMOID COLOSTOMY;  Surgeon: [name] [name], MD;  Location: [facility];  Service: General;  Laterality: N/A;  .   ERCP    . LAPAROSCOPY N/A 11/23/2016   Procedure: LAPAROSCOPY DIAGNOSTIC;  Surgeon: [name] [name], MD;  Location: [facility];  Service: General;  Laterality: N/A;  . PARTIAL HYSTERECTOMY    . PORTACATH PLACEMENT N/A 08/18/2016   Procedure: INSERTION PORT-A-CATH;  Surgeon: [name] [name], MD;  Location: [facility];  Service: General;  Laterality: N/A;    FAMILY HISTORY :  Mom- gall bladder ca; breast cancer- 70s; mat grand ma- stomach cancer- 60s; sblings- 2 no  cancers; 2 daughters- one daughter? Cervical  Family History  Problem Relation Age of Onset  . Breast cancer Mother   . Hyperlipidemia Father   . Heart disease Paternal Grandmother   . Stroke Maternal Grandfather     SOCIAL HISTORY:   Social History  Substance Use Topics  . Smoking status: Former Smoker    Packs/day: 0.25    Years: 35.00    Types: Cigarettes    Quit date: 08/11/2016  . Smokeless tobacco: Never Used  . Alcohol use No     Comment: occasional    ALLERGIES:  is allergic to prednisone.  MEDICATIONS:  Current Outpatient Prescriptions  Medication Sig Dispense Refill  . clopidogrel (PLAVIX) 75 MG tablet Take 75 mg by mouth every evening.     . diphenoxylate-atropine (LOMOTIL) 2.5-0.025 MG tablet Take 1 tablet by mouth 4 (four) times daily as needed for diarrhea or loose stools. Take it along with immodium 30 tablet 0  . levofloxacin (LEVAQUIN) 500 MG tablet Take 1 tablet (500 mg total) by mouth daily. 5 tablet 0  . lisinopril (PRINIVIL,ZESTRIL) 10 MG tablet Take 5 mg by mouth every morning.     . potassium chloride SA (K-DUR,KLOR-CON) 20 MEQ tablet TAKE ONE TABLET BY MOUTH TWICE DAILY 60 tablet 2  . traMADol (ULTRAM) 50 MG tablet Take 50 mg by mouth every 6 (six) hours as needed (for pain.).     . venlafaxine XR (EFFEXOR-XR) 37.5 MG 24 hr capsule TAKE ONE CAPSULE BY MOUTH EVERY MORNING WITH BREAKFAST 30 capsule 0  . nitroGLYCERIN (NITROSTAT) 0.4 MG SL tablet Place 1 tablet (0.4 mg total) under the tongue every 5 (five) minutes as needed for chest pain. (Patient not taking: Reported on 08/20/2017) 25 tablet 6  . oxyCODONE (OXY IR/ROXICODONE) 5 MG immediate release tablet Take 1-2 tablets (5-10 mg total) by mouth every 4 (four) hours as needed for moderate pain. (Patient not taking: Reported on 07/23/2017) 30 tablet 0  . prochlorperazine (COMPAZINE) 10 MG tablet Take 1 tablet by mouth 4 (four) times daily as needed for nausea.  2   No current facility-administered medications  for this visit.    Facility-Administered Medications Ordered in Other Visits  Medication Dose Route Frequency Provider Last Rate Last Dose  . fluorouracil (ADRUCIL) 4,100 mg in sodium chloride 0.9 % 68 mL chemo infusion  2,400 mg/m2 (Treatment Plan Recorded) Intravenous 1 day or 1 dose Brahmanday, Govinda R, MD      . leucovorin 700 mg in dextrose 5 % 250 mL infusion  700 mg Intravenous Once Brahmanday, Govinda R, MD 143 mL/hr at 08/20/17 1025 700 mg at 08/20/17 1025  . oxaliplatin (ELOXATIN) 120 mg in dextrose 5 % 500 mL chemo infusion  70 mg/m2 (Treatment Plan Recorded) Intravenous Once Brahmanday, Govinda R, MD 262 mL/hr at 08/20/17 1025 120 mg at 08/20/17 1025    PHYSICAL EXAMINATION: ECOG PERFORMANCE STATUS: 1 - Symptomatic but completely ambulatory  BP (!) 144/91 (BP Location: Left Arm, Patient Position: Sitting)     Pulse 74   Resp 16   Ht 5' 6" (1.676 m)   Wt 148 lb 3.2 oz (67.2 kg)   BMI 23.92 kg/m   Filed Weights   08/20/17 0854  Weight: 148 lb 3.2 oz (67.2 kg)    GENERAL: Well-nourished well-developed; Alert, no distress and comfortable.She is alone. EYES: no pallor or icterus OROPHARYNX: no thrush or ulceration; good dentition  NECK: supple, no masses felt LYMPH:  no palpable lymphadenopathy in the cervical, axillary or inguinal regions LUNGS: clear to auscultation and  No wheeze or crackles HEART/CVS: regular rate & rhythm and no murmurs; No lower extremity edema ABDOMEN:abdomen soft, non-tender and normal bowel sounds Musculoskeletal:no cyanosis of digits and no clubbing  PSYCH: alert & oriented x 3; flat affect NEURO: no focal motor/sensory deficits SKIN:  no rashes or significant lesions  LABORATORY DATA:  I have reviewed the data as listed    Component Value Date/Time   NA 137 08/20/2017 0837   NA 140 03/08/2017 1223   NA 144 07/25/2014 0444   K 3.6 08/20/2017 0837   K 3.7 07/25/2014 0444   CL 104 08/20/2017 0837   CL 107 07/25/2014 0444   CO2 26  08/20/2017 0837   CO2 30 07/25/2014 0444   GLUCOSE 105 (H) 08/20/2017 0837   GLUCOSE 87 07/25/2014 0444   BUN 15 08/20/2017 0837   BUN 13 03/08/2017 1223   BUN 8 07/25/2014 0444   CREATININE 0.71 08/20/2017 0837   CREATININE 0.69 07/25/2014 0444   CALCIUM 8.9 08/20/2017 0837   CALCIUM 8.4 (L) 07/25/2014 0444   PROT 7.0 08/20/2017 0837   PROT 6.8 03/08/2017 1223   PROT 6.5 07/25/2014 0444   ALBUMIN 3.4 (L) 08/20/2017 0837   ALBUMIN 4.2 03/08/2017 1223   ALBUMIN 2.6 (L) 07/25/2014 0444   AST 25 08/20/2017 0837   AST 96 (H) 07/25/2014 0444   ALT 13 (L) 08/20/2017 0837   ALT 123 (H) 07/25/2014 0444   ALKPHOS 117 08/20/2017 0837   ALKPHOS 189 (H) 07/25/2014 0444   BILITOT 0.4 08/20/2017 0837   BILITOT 0.2 03/08/2017 1223   BILITOT 2.0 (H) 07/25/2014 0444   GFRNONAA >60 08/20/2017 0837   GFRNONAA >60 07/25/2014 0444   GFRAA >60 08/20/2017 0837   GFRAA >60 07/25/2014 0444    No results found for: SPEP, UPEP  Lab Results  Component Value Date   WBC 7.1 08/20/2017   NEUTROABS 5.6 08/20/2017   HGB 11.7 (L) 08/20/2017   HCT 34.2 (L) 08/20/2017   MCV 86.4 08/20/2017   PLT 144 (L) 08/20/2017      Chemistry      Component Value Date/Time   NA 137 08/20/2017 0837   NA 140 03/08/2017 1223   NA 144 07/25/2014 0444   K 3.6 08/20/2017 0837   K 3.7 07/25/2014 0444   CL 104 08/20/2017 0837   CL 107 07/25/2014 0444   CO2 26 08/20/2017 0837   CO2 30 07/25/2014 0444   BUN 15 08/20/2017 0837   BUN 13 03/08/2017 1223   BUN 8 07/25/2014 0444   CREATININE 0.71 08/20/2017 0837   CREATININE 0.69 07/25/2014 0444      Component Value Date/Time   CALCIUM 8.9 08/20/2017 0837   CALCIUM 8.4 (L) 07/25/2014 0444   ALKPHOS 117 08/20/2017 0837   ALKPHOS 189 (H) 07/25/2014 0444   AST 25 08/20/2017 0837   AST 96 (H) 07/25/2014 0444   ALT 13 (L) 08/20/2017 0837   ALT 123 (H) 07/25/2014 0444     BILITOT 0.4 08/20/2017 0837   BILITOT 0.2 03/08/2017 1223   BILITOT 2.0 (H) 07/25/2014 0444      IMPRESSION: 1. Ill-defined circumferential wall thickening in the distal rectum with perirectal edema/inflammation. The soft tissue nodule contiguous with the right aspect of the rectum on the prior study, likely representing direct distention, has decreased in the interval. Small perirectal lymph nodes persist. 2. No evidence for metastatic disease elsewhere in the abdomen or pelvis   Electronically Signed   By: Misty Stanley M.D.   On: 11/15/2016 17:23  RADIOGRAPHIC STUDIES: I have personally reviewed the radiological images as listed and agreed with the findings in the report. No results found.   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 Patient tolerating chemotherapy fairly well without major side effects-noted below except thrombocytopenia. Currently s/p 5 cycles.   # proceed with cycle #6 today; Labs today reviewed;  acceptable for treatment today. Total of 10 ; wil get scan after adjuvant chemo.   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 Monitor for now.  # Hypokalemia- today 3.6; monitor for now.; continue for now.    # Thrombocytopenia- 144 sec to ox;[continue current dose to 70 mg/m2]    # follow up in 2 weeks/labs/MD./Chemotherapy.   No orders of the defined types were placed in this encounter.      Cammie Sickle, MD 08/20/2017 11:23 AM

## 2017-08-22 ENCOUNTER — Inpatient Hospital Stay: Payer: Medicaid Other

## 2017-08-22 DIAGNOSIS — Z5111 Encounter for antineoplastic chemotherapy: Secondary | ICD-10-CM | POA: Diagnosis not present

## 2017-08-22 DIAGNOSIS — C20 Malignant neoplasm of rectum: Secondary | ICD-10-CM

## 2017-08-22 DIAGNOSIS — C775 Secondary and unspecified malignant neoplasm of intrapelvic lymph nodes: Principal | ICD-10-CM

## 2017-08-22 MED ORDER — SODIUM CHLORIDE 0.9% FLUSH
10.0000 mL | INTRAVENOUS | Status: DC | PRN
  Administered 2017-08-22: 10 mL
  Filled 2017-08-22: qty 10

## 2017-08-22 MED ORDER — HEPARIN SOD (PORK) LOCK FLUSH 100 UNIT/ML IV SOLN
500.0000 [IU] | Freq: Once | INTRAVENOUS | Status: AC | PRN
  Administered 2017-08-22: 500 [IU]

## 2017-09-03 ENCOUNTER — Inpatient Hospital Stay: Payer: Medicaid Other

## 2017-09-03 ENCOUNTER — Inpatient Hospital Stay (HOSPITAL_BASED_OUTPATIENT_CLINIC_OR_DEPARTMENT_OTHER): Payer: Medicaid Other | Admitting: Internal Medicine

## 2017-09-03 VITALS — BP 143/96 | HR 72 | Temp 97.6°F | Resp 20 | Ht 66.0 in | Wt 150.0 lb

## 2017-09-03 DIAGNOSIS — C20 Malignant neoplasm of rectum: Secondary | ICD-10-CM

## 2017-09-03 DIAGNOSIS — D696 Thrombocytopenia, unspecified: Secondary | ICD-10-CM | POA: Diagnosis not present

## 2017-09-03 DIAGNOSIS — Z79899 Other long term (current) drug therapy: Secondary | ICD-10-CM

## 2017-09-03 DIAGNOSIS — Z923 Personal history of irradiation: Secondary | ICD-10-CM | POA: Diagnosis not present

## 2017-09-03 DIAGNOSIS — E876 Hypokalemia: Secondary | ICD-10-CM | POA: Diagnosis not present

## 2017-09-03 DIAGNOSIS — C775 Secondary and unspecified malignant neoplasm of intrapelvic lymph nodes: Secondary | ICD-10-CM

## 2017-09-03 DIAGNOSIS — Z5111 Encounter for antineoplastic chemotherapy: Secondary | ICD-10-CM | POA: Diagnosis not present

## 2017-09-03 LAB — CBC WITH DIFFERENTIAL/PLATELET
BASOS PCT: 1 %
Basophils Absolute: 0 10*3/uL (ref 0–0.1)
Eosinophils Absolute: 0.1 10*3/uL (ref 0–0.7)
Eosinophils Relative: 2 %
HEMATOCRIT: 35.5 % (ref 35.0–47.0)
HEMOGLOBIN: 12.1 g/dL (ref 12.0–16.0)
LYMPHS ABS: 0.7 10*3/uL — AB (ref 1.0–3.6)
Lymphocytes Relative: 14 %
MCH: 29.9 pg (ref 26.0–34.0)
MCHC: 34 g/dL (ref 32.0–36.0)
MCV: 88.1 fL (ref 80.0–100.0)
MONOS PCT: 13 %
Monocytes Absolute: 0.7 10*3/uL (ref 0.2–0.9)
NEUTROS ABS: 3.6 10*3/uL (ref 1.4–6.5)
NEUTROS PCT: 70 %
Platelets: 92 10*3/uL — ABNORMAL LOW (ref 150–440)
RBC: 4.04 MIL/uL (ref 3.80–5.20)
RDW: 18.5 % — ABNORMAL HIGH (ref 11.5–14.5)
WBC: 5.1 10*3/uL (ref 3.6–11.0)

## 2017-09-03 LAB — COMPREHENSIVE METABOLIC PANEL
ALBUMIN: 3.4 g/dL — AB (ref 3.5–5.0)
ALK PHOS: 114 U/L (ref 38–126)
ALT: 15 U/L (ref 14–54)
ANION GAP: 9 (ref 5–15)
AST: 28 U/L (ref 15–41)
BILIRUBIN TOTAL: 0.4 mg/dL (ref 0.3–1.2)
BUN: 14 mg/dL (ref 6–20)
CALCIUM: 9.1 mg/dL (ref 8.9–10.3)
CO2: 27 mmol/L (ref 22–32)
CREATININE: 0.68 mg/dL (ref 0.44–1.00)
Chloride: 102 mmol/L (ref 101–111)
GFR calc Af Amer: >60 mL/min (ref >60)
GFR calc non Af Amer: >60 mL/min (ref >60)
GLUCOSE: 96 mg/dL (ref 65–99)
Potassium: 3.3 mmol/L — ABNORMAL LOW (ref 3.5–5.1)
SODIUM: 138 mmol/L (ref 135–145)
TOTAL PROTEIN: 7 g/dL (ref 6.5–8.1)

## 2017-09-03 MED ORDER — HEPARIN SOD (PORK) LOCK FLUSH 100 UNIT/ML IV SOLN: 500.0000 [IU] | Freq: Once | INTRAVENOUS | Status: DC | PRN

## 2017-09-03 MED ORDER — DEXAMETHASONE SODIUM PHOSPHATE 10 MG/ML IJ SOLN
10.0000 mg | Freq: Once | INTRAMUSCULAR | Status: AC
  Administered 2017-09-03: 10 mg via INTRAVENOUS
  Filled 2017-09-03: qty 1

## 2017-09-03 MED ORDER — PALONOSETRON HCL INJECTION 0.25 MG/5ML
0.2500 mg | Freq: Once | INTRAVENOUS | Status: AC
  Administered 2017-09-03: 0.25 mg via INTRAVENOUS
  Filled 2017-09-03: qty 5

## 2017-09-03 MED ORDER — LEUCOVORIN CALCIUM INJECTION 350 MG
700.0000 mg | Freq: Once | INTRAVENOUS | Status: AC
  Administered 2017-09-03: 700 mg via INTRAVENOUS
  Filled 2017-09-03: qty 35

## 2017-09-03 MED ORDER — OXALIPLATIN CHEMO INJECTION 100 MG/20ML
70.0000 mg/m2 | Freq: Once | INTRAVENOUS | Status: AC
  Administered 2017-09-03: 120 mg via INTRAVENOUS
  Filled 2017-09-03: qty 4

## 2017-09-03 MED ORDER — DEXTROSE 5 % IV SOLN
Freq: Once | INTRAVENOUS | Status: AC
  Administered 2017-09-03: 10:00:00 via INTRAVENOUS
  Filled 2017-09-03: qty 1000

## 2017-09-03 MED ORDER — SODIUM CHLORIDE 0.9 % IV SOLN
2400.0000 mg/m2 | INTRAVENOUS | Status: DC
  Administered 2017-09-03: 4100 mg via INTRAVENOUS
  Filled 2017-09-03: qty 82

## 2017-09-03 MED ORDER — SODIUM CHLORIDE 0.9% FLUSH
10.0000 mL | INTRAVENOUS | Status: DC | PRN
Start: 2017-09-03 — End: 2017-09-03
  Filled 2017-09-03: qty 10

## 2017-09-03 NOTE — Progress Notes (Signed)
[place] OFFICE PROGRESS NOTE  Patient Care Team: Rachel Jude, MD as PCP - General (Family Medicine) Rachel Lye, MD (General Surgery)  Cancer Staging No matching staging information was found for the patient.   Oncology History   # SEP 2017- RECTAL CA mod diff adeno; STAGE III [no EUS; positive ~52m LN; Dr.sankar] CEA-7; Sep 21st START 5FU-RT [finished NOV 2017]; April 7th APR- 2018- ypT3ypN0 [5LN]; positive for LVI/PNI [poor response to chemo]; Margins-Negative.  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  # smoker/ TIA [aug 2017- Plavix]  # Multiple family members-malignancy-# MMR-STABLE        [place]al cancer [place]elvic [place] node [place]     INTERVAL HISTORY:  Rachel KALP89y.o.  female patient with Above history of rectal cancer currently on adjuvant chemotherapy-with FOLFOX. Patient is currently status post 6 cycles Of planned total of 10 cycles.  In the [place] Patient denies any significant tingling or numbness. No vomiting. No diarrhea. No sores in the mouth.  Denies any nosebleeds or gum bleeding.  No fevers or chills. Complains of chronic mild back pain not worse. Denies any gum bleeding nose bleeds.  REVIEW OF SYSTEMS:  A complete 10 point review of system is done which is negative except mentioned above/history of present illness.   PAST MEDICAL HISTORY :  Past Medical History:  Diagnosis Date  . Arthritis   . Body mass index (BMI) of 24.0-24.9 in adult   . Cardiovascular disease   . Current every day smoker    a. ~35 years - > has cut down to < 3/4 ppd  . Headache   . History of UTI   . Hypertension   . Midsternal chest pain    a. 05/2014 Stress Echo: Ex time: 8:09, ECG  w/o acute changes, EF nl with possible mid and apical anterior HK-->cath recommended.  . Port-a-cath in place    RIGHT SIDE  . Rectal cancer [place]  . [place]    07/2016 tia no neurology followup now seen by pcp  . Syncope and collapse   . Thyroid nodule 2018   FINDINGS CONSISTENT WITH A HURTHLE CELL LESION AND/OR NEOPLASM (BETHESDA CATEGORY IV).    PAST SURGICAL HISTORY :   Past Surgical History:  Procedure Laterality Date  . ABDOMINAL HYSTERECTOMY    . ABDOMINAL PERINEAL BOWEL RESECTION N/A 03/16/2017   Procedure: ABDOMINAL PERINEAL RESECTION;  Surgeon: SChristene Lye MD;  Location: [facility];  Service: General;  Laterality: N/A;  . APPENDECTOMY  03/16/2017   Procedure: APPENDECTOMY;  Surgeon: SChristene Lye MD;  Location: [facility];  Service: General;;  . BACK SURGERY     l 3,4,5  . BIOPSY THYROID Right 11/16/2016   FINDINGS CONSISTENT WITH A HURTHLE CELL LESION AND/OR NEOPLASM (BETHESDA CATEGORY IV).  .Marland KitchenCHOLECYSTECTOMY    . COLONOSCOPY WITH PROPOFOL N/A 08/15/2016   Procedure: COLONOSCOPY WITH PROPOFOL;  Surgeon: MLollie Sails MD;  [facility];  Service: Endoscopy;  Laterality: N/A;  . COLONOSCOPY WITH PROPOFOL N/A 11/08/2016   Procedure: COLONOSCOPY WITH PROPOFOL;  Surgeon: SChristene Lye MD;  Location: [facility];  Service: Endoscopy;  Laterality: N/A;  . COLOSTOMY N/A 11/23/2016   Procedure: SIGMOID COLOSTOMY;  Surgeon: SChristene Lye  MD;  Location: [facility];  Service: General;  Laterality: N/A;  . ERCP    . LAPAROSCOPY N/A 11/23/2016   Procedure: LAPAROSCOPY DIAGNOSTIC;  Surgeon: Rachel Lye, MD;  Location: [facility];  Service: General;  Laterality: N/A;  . PARTIAL HYSTERECTOMY    . PORTACATH PLACEMENT N/A 08/18/2016   Procedure: INSERTION PORT-A-CATH;  Surgeon: Rachel Lye, MD;  Location: [facility];  Service: General;  Laterality: N/A;    FAMILY HISTORY :  Mom- gall bladder ca; breast cancer- 29s;  mat grand ma- stomach cancer- 44s; sblings- 2 no cancers; 2 daughters- one daughter? Cervical  Family History  Problem Relation Age of Onset  . Breast cancer Mother   . Hyperlipidemia Father   . Heart disease Paternal Grandmother   . Stroke Maternal Grandfather     SOCIAL HISTORY:   Social History  Substance Use Topics  . Smoking status: Former Smoker    Packs/day: 0.25    Years: 35.00    Types: Cigarettes    Quit date: 08/11/2016  . Smokeless tobacco: Never Used  . Alcohol use No     Comment: occasional    ALLERGIES:  is allergic to prednisone.  MEDICATIONS:  Current Outpatient Prescriptions  Medication Sig Dispense Refill  . clopidogrel (PLAVIX) 75 MG tablet Take 75 mg by mouth every evening.     [name] lisinopril (PRINIVIL,ZESTRIL) 10 MG tablet Take 5 mg by mouth every morning.     . potassium chloride SA (K-DUR,KLOR-CON) 20 MEQ tablet TAKE ONE TABLET BY MOUTH TWICE DAILY (Patient taking differently: TAKE ONE TABLET BY MOUTH DAILY) 60 tablet 2  . traMADol (ULTRAM) 50 MG tablet Take 50 mg by mouth every 6 (six) hours as needed (for pain.).     [name] venlafaxine XR (EFFEXOR-XR) 37.5 MG 24 hr capsule TAKE ONE CAPSULE BY MOUTH EVERY MORNING WITH BREAKFAST 30 capsule 0  . diphenoxylate-atropine (LOMOTIL) 2.5-0.025 MG tablet Take 1 tablet by mouth 4 (four) times daily as needed for diarrhea or loose stools. Take it along with immodium (Patient not taking: Reported on 09/03/2017) 30 tablet 0  . nitroGLYCERIN (NITROSTAT) 0.4 MG SL tablet Place 1 tablet (0.4 mg total) under the tongue every 5 (five) minutes as needed for chest pain. (Patient not taking: Reported on 08/20/2017) 25 tablet 6  . oxyCODONE (OXY IR/ROXICODONE) 5 MG immediate release tablet Take 1-2 tablets (5-10 mg total) by mouth every 4 (four) hours as needed for moderate pain. (Patient not taking: Reported on 07/23/2017) 30 tablet 0  . prochlorperazine (COMPAZINE) 10 MG tablet Take 1 tablet by mouth 4 (four) times daily as needed for  nausea.  2   No current facility-administered medications for this visit.     PHYSICAL EXAMINATION: ECOG PERFORMANCE STATUS: 1 - Symptomatic but completely ambulatory  BP (!) 143/96 (Patient Position: Sitting)   Pulse 72   Temp 97.6 F (36.4 C) (Tympanic)   Resp 20   Ht _0  (1.676 m)   Wt 150 lb (68 kg)   BMI 24.21 kg/m   Filed Weights   09/03/17 0844  Weight: 150 lb (68 kg)    GENERAL: Well-nourished well-developed; Alert, no distress and comfortable.She is alone. EYES: no pallor or icterus OROPHARYNX: no thrush or ulceration; good dentition  NECK: supple, no masses felt LYMPH:  no palpable lymphadenopathy in the cervical, axillary or inguinal regions LUNGS: clear to auscultation and  No wheeze or crackles HEART/CVS: regular rate & rhythm and no murmurs; No lower extremity edema ABDOMEN:abdomen soft,  non-tender and normal bowel sounds Musculoskeletal:no cyanosis of digits and no clubbing  PSYCH: alert & oriented x 3; flat affect NEURO: no focal motor/sensory deficits SKIN:  no rashes or significant lesions  LABORATORY DATA:  I have reviewed the data as listed    Component Value Date/Time   NA 138 09/03/2017 0838   NA 140 03/08/2017 1223   NA 144 07/25/2014 0444   K 3.3 (L) 09/03/2017 0838   K 3.7 07/25/2014 0444   CL 102 09/03/2017 0838   CL 107 07/25/2014 0444   CO2 27 09/03/2017 0838   CO2 30 07/25/2014 0444   GLUCOSE 96 09/03/2017 0838   GLUCOSE 87 07/25/2014 0444   BUN 14 09/03/2017 0838   BUN 13 03/08/2017 1223   BUN 8 07/25/2014 0444   CREATININE 0.68 09/03/2017 0838   CREATININE 0.69 07/25/2014 0444   CALCIUM 9.1 09/03/2017 0838   CALCIUM 8.4 (L) 07/25/2014 0444   PROT 7.0 09/03/2017 0838   PROT 6.8 03/08/2017 1223   PROT 6.5 07/25/2014 0444   ALBUMIN 3.4 (L) 09/03/2017 0838   ALBUMIN 4.2 03/08/2017 1223   ALBUMIN 2.6 (L) 07/25/2014 0444   AST 28 09/03/2017 0838   AST 96 (H) 07/25/2014 0444   ALT 15 09/03/2017 0838   ALT 123 (H) 07/25/2014  0444   ALKPHOS 114 09/03/2017 0838   ALKPHOS 189 (H) 07/25/2014 0444   BILITOT 0.4 09/03/2017 0838   BILITOT 0.2 03/08/2017 1223   BILITOT 2.0 (H) 07/25/2014 0444   GFRNONAA >60 09/03/2017 0838   GFRNONAA >60 07/25/2014 0444   GFRAA >60 09/03/2017 0838   GFRAA >60 07/25/2014 0444    No results found for: SPEP, UPEP  Lab Results  Component Value Date   WBC 5.1 09/03/2017   NEUTROABS 3.6 09/03/2017   HGB 12.1 09/03/2017   HCT 35.5 09/03/2017   MCV 88.1 09/03/2017   PLT 92 (L) 09/03/2017      Chemistry      Component Value Date/Time   NA 138 09/03/2017 0838   NA 140 03/08/2017 1223   NA 144 07/25/2014 0444   K 3.3 (L) 09/03/2017 0838   K 3.7 07/25/2014 0444   CL 102 09/03/2017 0838   CL 107 07/25/2014 0444   CO2 27 09/03/2017 0838   CO2 30 07/25/2014 0444   BUN 14 09/03/2017 0838   BUN 13 03/08/2017 1223   BUN 8 07/25/2014 0444   CREATININE 0.68 09/03/2017 0838   CREATININE 0.69 07/25/2014 0444      Component Value Date/Time   CALCIUM 9.1 09/03/2017 0838   CALCIUM 8.4 (L) 07/25/2014 0444   ALKPHOS 114 09/03/2017 0838   ALKPHOS 189 (H) 07/25/2014 0444   AST 28 09/03/2017 0838   AST 96 (H) 07/25/2014 0444   ALT 15 09/03/2017 0838   ALT 123 (H) 07/25/2014 0444   BILITOT 0.4 09/03/2017 0838   BILITOT 0.2 03/08/2017 1223   BILITOT 2.0 (H) 07/25/2014 0444     IMPRESSION: 1. Ill-defined circumferential wall thickening in the distal rectum with perirectal edema/inflammation. The soft tissue nodule contiguous with the right aspect of the rectum on the prior study, likely representing direct distention, has decreased in the interval. Small perirectal lymph nodes persist. 2. No evidence for metastatic disease elsewhere in the abdomen or pelvis   Electronically Signed   By: Kennith Center M.D.   On: 11/15/2016 17:23  RADIOGRAPHIC STUDIES: I have personally reviewed the radiological images as listed and agreed with the findings in the  report. No results found.    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 Patient tolerating chemotherapy fairly well without major side effects-noted below except thrombocytopenia. Currently s/p 6 cycles.   # proceed with cycle #7 today; Labs today reviewed;  acceptable for treatment today- except platelets- 92. Total of 10 ; wil get scan after adjuvant chemo.   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 Monitor for now.  # Hypokalemia- today 3.3; monitor for now.; continue for now.    # Thrombocytopenia- 92 sec to ox;[continue current dose to 70 mg/m2]    # follow up in 2 weeks/labs/MD./Chemotherapy.   No orders of the defined types were placed in this encounter.      Cammie Sickle, MD 09/03/2017 9:11 AM

## 2017-09-03 NOTE — Assessment & Plan Note (Addendum)
#   Rectal  Cancer- stage III- s/p neoadjuvant chemoradiation with 5-FU continuous infusion with radiation; s/p APR April 7th 2018;  On adjuvant FOLFOX. Patient tolerating chemotherapy fairly well without major side effects-noted below except thrombocytopenia. Currently s/p 6 cycles.   # proceed with cycle #7 today; Labs today reviewed;  acceptable for treatment today- except platelets- 92. Total of 10 ; wil get scan after adjuvant chemo.   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 Monitor for now.  # Hypokalemia- today 3.3; monitor for now.; continue for now.    # Thrombocytopenia- 92 sec to ox;[continue current dose to 70 mg/m2]    # follow up in 2 weeks/labs/MD./Chemotherapy.

## 2017-09-03 NOTE — Progress Notes (Signed)
Per MD note:   # Thrombocytopenia- 92 sec to ox;[continue current dose to 70 mg/m2]

## 2017-09-04 ENCOUNTER — Other Ambulatory Visit: Payer: Self-pay | Admitting: *Deleted

## 2017-09-04 DIAGNOSIS — E876 Hypokalemia: Secondary | ICD-10-CM

## 2017-09-04 DIAGNOSIS — C775 Secondary and unspecified malignant neoplasm of intrapelvic lymph nodes: Principal | ICD-10-CM

## 2017-09-04 DIAGNOSIS — C20 Malignant neoplasm of rectum: Secondary | ICD-10-CM

## 2017-09-04 MED ORDER — POTASSIUM CHLORIDE CRYS ER 20 MEQ PO TBCR: 20.0000 meq | EXTENDED_RELEASE_TABLET | Freq: Two times a day (BID) | ORAL | 4 refills | Status: DC

## 2017-09-04 NOTE — Telephone Encounter (Signed)
Dx:  Rectal cancer metastasized to intrape...   Ref Range & Units 1d ago  Potassium 3.5 - 5.1 mmol/L 3.3

## 2017-09-04 NOTE — Telephone Encounter (Signed)
Potassium rx approved. MD stated pt needs to take k dur 20 meq twice daily. New rx sent to pharmacy

## 2017-09-05 ENCOUNTER — Inpatient Hospital Stay: Payer: Medicaid Other

## 2017-09-05 VITALS — BP 135/85 | HR 65 | Temp 97.1°F | Resp 20

## 2017-09-05 DIAGNOSIS — C775 Secondary and unspecified malignant neoplasm of intrapelvic lymph nodes: Principal | ICD-10-CM

## 2017-09-05 DIAGNOSIS — C20 Malignant neoplasm of rectum: Secondary | ICD-10-CM

## 2017-09-05 DIAGNOSIS — Z5111 Encounter for antineoplastic chemotherapy: Secondary | ICD-10-CM | POA: Diagnosis not present

## 2017-09-05 MED ORDER — SODIUM CHLORIDE 0.9% FLUSH
10.0000 mL | INTRAVENOUS | Status: DC | PRN
  Administered 2017-09-05: 10 mL
  Filled 2017-09-05: qty 10

## 2017-09-05 MED ORDER — HEPARIN SOD (PORK) LOCK FLUSH 100 UNIT/ML IV SOLN
500.0000 [IU] | Freq: Once | INTRAVENOUS | Status: AC | PRN
  Administered 2017-09-05: 500 [IU]

## 2017-09-17 ENCOUNTER — Encounter: Payer: Self-pay | Admitting: Internal Medicine

## 2017-09-17 ENCOUNTER — Inpatient Hospital Stay: Payer: Medicaid Other

## 2017-09-17 ENCOUNTER — Inpatient Hospital Stay: Payer: Medicaid Other | Attending: Internal Medicine | Admitting: Internal Medicine

## 2017-09-17 VITALS — BP 166/108 | HR 74 | Temp 97.6°F | Resp 20 | Ht 66.0 in | Wt 150.0 lb

## 2017-09-17 DIAGNOSIS — Z8673 Personal history of transient ischemic attack (TIA), and cerebral infarction without residual deficits: Secondary | ICD-10-CM | POA: Insufficient documentation

## 2017-09-17 DIAGNOSIS — Z79899 Other long term (current) drug therapy: Secondary | ICD-10-CM | POA: Diagnosis not present

## 2017-09-17 DIAGNOSIS — I2510 Atherosclerotic heart disease of native coronary artery without angina pectoris: Secondary | ICD-10-CM | POA: Insufficient documentation

## 2017-09-17 DIAGNOSIS — C20 Malignant neoplasm of rectum: Secondary | ICD-10-CM

## 2017-09-17 DIAGNOSIS — E041 Nontoxic single thyroid nodule: Secondary | ICD-10-CM | POA: Diagnosis not present

## 2017-09-17 DIAGNOSIS — M961 Postlaminectomy syndrome, not elsewhere classified: Secondary | ICD-10-CM | POA: Insufficient documentation

## 2017-09-17 DIAGNOSIS — M5410 Radiculopathy, site unspecified: Secondary | ICD-10-CM | POA: Diagnosis not present

## 2017-09-17 DIAGNOSIS — C775 Secondary and unspecified malignant neoplasm of intrapelvic lymph nodes: Secondary | ICD-10-CM | POA: Insufficient documentation

## 2017-09-17 DIAGNOSIS — I10 Essential (primary) hypertension: Secondary | ICD-10-CM | POA: Insufficient documentation

## 2017-09-17 DIAGNOSIS — Z5111 Encounter for antineoplastic chemotherapy: Secondary | ICD-10-CM | POA: Insufficient documentation

## 2017-09-17 DIAGNOSIS — E876 Hypokalemia: Secondary | ICD-10-CM | POA: Insufficient documentation

## 2017-09-17 DIAGNOSIS — D696 Thrombocytopenia, unspecified: Secondary | ICD-10-CM | POA: Diagnosis not present

## 2017-09-17 DIAGNOSIS — K5900 Constipation, unspecified: Secondary | ICD-10-CM | POA: Diagnosis not present

## 2017-09-17 DIAGNOSIS — Z87440 Personal history of urinary (tract) infections: Secondary | ICD-10-CM | POA: Diagnosis not present

## 2017-09-17 DIAGNOSIS — Z87891 Personal history of nicotine dependence: Secondary | ICD-10-CM | POA: Diagnosis not present

## 2017-09-17 DIAGNOSIS — Z7902 Long term (current) use of antithrombotics/antiplatelets: Secondary | ICD-10-CM | POA: Insufficient documentation

## 2017-09-17 LAB — CBC WITH DIFFERENTIAL/PLATELET
Basophils Absolute: 0 10*3/uL (ref 0–0.1)
Basophils Relative: 1 %
EOS PCT: 2 %
Eosinophils Absolute: 0.1 10*3/uL (ref 0–0.7)
HCT: 36.6 % (ref 35.0–47.0)
Hemoglobin: 12.2 g/dL (ref 12.0–16.0)
LYMPHS ABS: 0.6 10*3/uL — AB (ref 1.0–3.6)
LYMPHS PCT: 12 %
MCH: 29.9 pg (ref 26.0–34.0)
MCHC: 33.4 g/dL (ref 32.0–36.0)
MCV: 89.5 fL (ref 80.0–100.0)
MONO ABS: 0.7 10*3/uL (ref 0.2–0.9)
Monocytes Relative: 13 %
Neutro Abs: 3.7 10*3/uL (ref 1.4–6.5)
Neutrophils Relative %: 72 %
PLATELETS: 85 10*3/uL — AB (ref 150–440)
RBC: 4.09 MIL/uL (ref 3.80–5.20)
RDW: 17.6 % — ABNORMAL HIGH (ref 11.5–14.5)
WBC: 5.1 10*3/uL (ref 3.6–11.0)

## 2017-09-17 LAB — COMPREHENSIVE METABOLIC PANEL
ALK PHOS: 127 U/L — AB (ref 38–126)
ALT: 17 U/L (ref 14–54)
AST: 28 U/L (ref 15–41)
Albumin: 3.6 g/dL (ref 3.5–5.0)
Anion gap: 7 (ref 5–15)
BILIRUBIN TOTAL: 0.3 mg/dL (ref 0.3–1.2)
BUN: 15 mg/dL (ref 6–20)
CALCIUM: 9 mg/dL (ref 8.9–10.3)
CHLORIDE: 102 mmol/L (ref 101–111)
CO2: 30 mmol/L (ref 22–32)
CREATININE: 0.81 mg/dL (ref 0.44–1.00)
Glucose, Bld: 83 mg/dL (ref 65–99)
Potassium: 3.5 mmol/L (ref 3.5–5.1)
Sodium: 139 mmol/L (ref 135–145)
Total Protein: 7.3 g/dL (ref 6.5–8.1)

## 2017-09-17 MED ORDER — LEUCOVORIN CALCIUM INJECTION 350 MG
700.0000 mg | Freq: Once | INTRAVENOUS | Status: AC
  Administered 2017-09-17: 700 mg via INTRAVENOUS
  Filled 2017-09-17: qty 35

## 2017-09-17 MED ORDER — PALONOSETRON HCL INJECTION 0.25 MG/5ML
0.2500 mg | Freq: Once | INTRAVENOUS | Status: AC
  Administered 2017-09-17: 0.25 mg via INTRAVENOUS
  Filled 2017-09-17: qty 5

## 2017-09-17 MED ORDER — TRAMADOL HCL 50 MG PO TABS: 50.0000 mg | ORAL_TABLET | Freq: Four times a day (QID) | ORAL | 0 refills | Status: DC | PRN

## 2017-09-17 MED ORDER — DEXTROSE 5 % IV SOLN
Freq: Once | INTRAVENOUS | Status: AC
  Administered 2017-09-17: 10:00:00 via INTRAVENOUS
  Filled 2017-09-17: qty 1000

## 2017-09-17 MED ORDER — DEXAMETHASONE SODIUM PHOSPHATE 10 MG/ML IJ SOLN
10.0000 mg | Freq: Once | INTRAMUSCULAR | Status: AC
  Administered 2017-09-17: 10 mg via INTRAVENOUS
  Filled 2017-09-17: qty 1

## 2017-09-17 MED ORDER — OXALIPLATIN CHEMO INJECTION 100 MG/20ML
100.0000 mg | Freq: Once | INTRAVENOUS | Status: AC
  Administered 2017-09-17: 100 mg via INTRAVENOUS
  Filled 2017-09-17 (×3): qty 20

## 2017-09-17 MED ORDER — OXYCODONE HCL 5 MG PO TABS: 5.0000 mg | ORAL_TABLET | ORAL | 0 refills | Status: DC | PRN

## 2017-09-17 MED ORDER — SODIUM CHLORIDE 0.9 % IV SOLN
2400.0000 mg/m2 | INTRAVENOUS | Status: DC
  Administered 2017-09-17: 4100 mg via INTRAVENOUS
  Filled 2017-09-17 (×2): qty 82

## 2017-09-17 NOTE — Progress Notes (Signed)
[facility] OFFICE PROGRESS NOTE  Patient Care Team: Lynnell Jude, MD as PCP - General (Family Medicine) Christene Lye, MD (General Surgery)  Cancer Staging No matching staging information was found for the patient.   Oncology History   # SEP 2017- RECTAL CA mod diff adeno; STAGE III [no EUS; positive ~48m LN; Dr.sankar] CEA-7; Sep 21st START 5FU-RT [finished NOV 2017]; April 7th APR- 2018- [id number] [5LN]; positive for LVI/PNI [poor response to chemo]; Margins-Negative.  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  female patient with above history of rectal cancer currently on adjuvant chemotherapy-with FOLFOX. Patient is currently status post 7 cycles of planned total of 10 cycles.  Patient complains of low back pain that is chronic in nature. Localizes to left lower back radiating down left leg. She complains of limp. She takes tramadol for pain which minimally controls pain. She is reluctant to take oxycodone due to constipation with colostomy. She has had previous hx of back surgery. She rates pain 8/10.   She has noticed some streaks of blood from her rectum and continues to have to wear a mini-pad post-operatively due to drainage from rectum. She denies nausea, vomiting, or diarrhea. Denies sores in mouth, nosebleeds, or gum bleeding. No fever or chills. No peripheral neuropathy.    REVIEW OF SYSTEMS:  A complete 10 point review of system is done which is negative except mentioned above/history of present illness.   PAST MEDICAL HISTORY :   Past Medical History:  Diagnosis Date  . Arthritis   . Body mass index (BMI) of 24.0-24.9 in adult   . Cardiovascular disease   . Current every day smoker    a. ~35 years - > has cut down to < 3/4 ppd  . Headache   . History of UTI   . Hypertension   . Midsternal chest pain    a. 05/2014 Stress Echo: Ex time: 8:09, ECG w/o acute changes, EF nl with possible mid and apical anterior HK-->cath recommended.  . Port-A-Cath in place    RIGHT SIDE  . Rectal cancer ([facility] 2017  . [place]    07/2016 tia no neurology followup now seen by pcp  . Syncope and collapse   . Thyroid nodule 2018   FINDINGS CONSISTENT WITH A HURTHLE CELL LESION AND/OR NEOPLASM (BETHESDA CATEGORY IV).    PAST SURGICAL HISTORY :   Past Surgical History:  Procedure Laterality Date  . ABDOMINAL HYSTERECTOMY    . ABDOMINAL PERINEAL BOWEL RESECTION N/A 03/16/2017   Procedure: ABDOMINAL PERINEAL RESECTION;  Surgeon: SChristene Lye MD;  Location: [facility] OR[facility];  Service: General;  Laterality: N/A;  . APPENDECTOMY  03/16/2017   Procedure: APPENDECTOMY;  Surgeon: SChristene Lye MD;  Location: [facility] OR[facility];  Service: General;;  . BACK SURGERY     l 3,4,5  . BIOPSY THYROID Right 11/16/2016   FINDINGS CONSISTENT WITH A HURTHLE CELL LESION AND/OR NEOPLASM (BETHESDA CATEGORY IV).  .Marland KitchenCHOLECYSTECTOMY    . COLONOSCOPY WITH PROPOFOL N/A 08/15/2016   Procedure: COLONOSCOPY WITH PROPOFOL;  Surgeon: MBillie Ruddy  Gustavo Lah, MD;  Location: [facility];  Service: Endoscopy;  Laterality: N/A;  . COLONOSCOPY WITH PROPOFOL N/A 11/08/2016   Procedure: COLONOSCOPY WITH PROPOFOL;  Surgeon: Christene Lye, MD;  Location: [facility];  Service: Endoscopy;  Laterality: N/A;  . COLOSTOMY N/A 11/23/2016   Procedure: SIGMOID COLOSTOMY;  Surgeon: Christene Lye, MD;  Location: [facility];  Service: General;  Laterality: N/A;  . ERCP    . LAPAROSCOPY N/A 11/23/2016   Procedure: LAPAROSCOPY DIAGNOSTIC;  Surgeon: Christene Lye, MD;  Location: [facility];  Service: General;  Laterality: N/A;  . PARTIAL HYSTERECTOMY    . PORTACATH PLACEMENT N/A 08/18/2016   Procedure: INSERTION PORT-A-CATH;  Surgeon: Christene Lye, MD;  Location: [facility];  Service: General;  Laterality: N/A;    FAMILY HISTORY :  Mom- gall bladder ca; breast cancer- 71s; mat grand ma- stomach cancer- 70s; sblings- 2 no cancers; 2 daughters- one daughter? Cervical  Family History  Problem Relation Age of Onset  . Breast cancer Mother   . Hyperlipidemia Father   . Heart disease Paternal Grandmother   . Stroke Maternal Grandfather     SOCIAL HISTORY:   Social History  Substance Use Topics  . Smoking status: Former Smoker    Packs/day: 0.25    Years: 35.00    Types: Cigarettes    Quit date: 08/11/2016  . Smokeless tobacco: Never Used  . Alcohol use No     Comment: occasional    ALLERGIES:  is allergic to prednisone.  MEDICATIONS:  Current Outpatient Prescriptions  Medication Sig Dispense Refill  . clopidogrel (PLAVIX) 75 MG tablet Take 75 mg by mouth every evening.     . lidocaine-prilocaine (EMLA) cream APPLY TO AFFECTED AREA AS NEEDED for port a cath access  3  . lisinopril (PRINIVIL,ZESTRIL) 10 MG tablet Take 5 mg by mouth every morning.     [name] oxyCODONE (OXY IR/ROXICODONE) 5 MG immediate release tablet Take 1-2 tablets (5-10 mg total) by mouth every 4 (four) hours as needed for moderate pain or severe pain. 30 tablet 0  . potassium chloride SA (K-DUR,KLOR-CON) 20 MEQ tablet Take 1 tablet (20 mEq total) by mouth 2 (two) times daily. 60 tablet 4  . traMADol (ULTRAM) 50 MG tablet Take 1 tablet (50 mg total) by mouth every 6 (six) hours as needed (for pain.). 30 tablet 0  . venlafaxine XR (EFFEXOR-XR) 37.5 MG 24 hr capsule TAKE ONE CAPSULE BY MOUTH EVERY MORNING WITH BREAKFAST 30 capsule 0  . diphenoxylate-atropine (LOMOTIL) 2.5-0.025 MG tablet Take 1 tablet by mouth 4 (four) times daily as needed for diarrhea or loose stools.  Take it along with immodium (Patient not taking: Reported on 09/17/2017) 30 tablet 0  . nitroGLYCERIN (NITROSTAT) 0.4 MG SL tablet Place 1 tablet (0.4 mg total) under the tongue every 5 (five) minutes as needed for chest pain. (Patient not taking: Reported on 08/20/2017) 25 tablet 6  . prochlorperazine (COMPAZINE) 10 MG tablet Take 1 tablet by mouth 4 (four) times daily as needed for nausea.  2   No current facility-administered medications for this visit.    Facility-Administered Medications Ordered in Other Visits  Medication Dose Route Frequency Provider Last Rate Last Dose  . dexamethasone (DECADRON) injection 10 mg  10 mg Intravenous Once Charlaine Dalton R, MD      . dextrose 5 % solution   Intravenous Once Charlaine Dalton R, MD      . fluorouracil (ADRUCIL) 4,100 mg in sodium chloride 0.9 %  68 mL chemo infusion  2,400 mg/m2 (Treatment Plan Recorded) Intravenous 1 day or 1 dose Charlaine Dalton R, MD      . leucovorin 684 mg in dextrose 5 % 250 mL infusion  400 mg/m2 (Treatment Plan Recorded) Intravenous Once Cammie Sickle, MD      . oxaliplatin (ELOXATIN) 105 mg in dextrose 5 % 500 mL chemo infusion  60 mg/m2 (Treatment Plan Recorded) Intravenous Once Cammie Sickle, MD      . palonosetron (ALOXI) injection 0.25 mg  0.25 mg Intravenous Once Cammie Sickle, MD        PHYSICAL EXAMINATION: ECOG PERFORMANCE STATUS: 1 - Symptomatic but completely ambulatory  BP (!) 166/108   Pulse 74   Temp 97.6 F (36.4 C) (Tympanic)   Resp 20   Ht _0  (1.676 m)   Wt 150 lb (68 kg)   BMI 24.21 kg/m   Filed Weights   09/17/17 0900  Weight: 150 lb (68 kg)    GENERAL: Well-nourished well-developed; Alert, obvious discomfort, grimacing w/ movement.She is alone. EYES: no pallor or icterus OROPHARYNX: no thrush or ulceration; good dentition  NECK: supple, no masses felt LYMPH:  no palpable lymphadenopathy in the cervical, axillary or inguinal regions LUNGS: clear  to auscultation and  No wheeze or crackles HEART/CVS: regular rate & rhythm and no murmurs; No lower extremity edema ABDOMEN:abdomen soft, non-tender and normal bowel sounds Musculoskeletal:no cyanosis of digits and no clubbing. Positive left sided straight leg raise. Limping during ambulation.  PSYCH: alert & oriented x 3 NEURO: no focal motor/sensory deficits SKIN:  no rashes or significant lesions  LABORATORY DATA:  I have reviewed the data as listed    Component Value Date/Time   NA 139 09/17/2017 0838   NA 140 03/08/2017 1223   NA 144 07/25/2014 0444   K 3.5 09/17/2017 0838   K 3.7 07/25/2014 0444   CL 102 09/17/2017 0838   CL 107 07/25/2014 0444   CO2 30 09/17/2017 0838   CO2 30 07/25/2014 0444   GLUCOSE 83 09/17/2017 0838   GLUCOSE 87 07/25/2014 0444   BUN 15 09/17/2017 0838   BUN 13 03/08/2017 1223   BUN 8 07/25/2014 0444   CREATININE 0.81 09/17/2017 0838   CREATININE 0.69 07/25/2014 0444   CALCIUM 9.0 09/17/2017 0838   CALCIUM 8.4 (L) 07/25/2014 0444   PROT 7.3 09/17/2017 0838   PROT 6.8 03/08/2017 1223   PROT 6.5 07/25/2014 0444   ALBUMIN 3.6 09/17/2017 0838   ALBUMIN 4.2 03/08/2017 1223   ALBUMIN 2.6 (L) 07/25/2014 0444   AST 28 09/17/2017 0838   AST 96 (H) 07/25/2014 0444   ALT 17 09/17/2017 0838   ALT 123 (H) 07/25/2014 0444   ALKPHOS 127 (H) 09/17/2017 0838   ALKPHOS 189 (H) 07/25/2014 0444   BILITOT 0.3 09/17/2017 0838   BILITOT 0.2 03/08/2017 1223   BILITOT 2.0 (H) 07/25/2014 0444   GFRNONAA >60 09/17/2017 0838   GFRNONAA >60 07/25/2014 0444   GFRAA >60 09/17/2017 0838   GFRAA >60 07/25/2014 0444    No results found for: SPEP, UPEP  Lab Results  Component Value Date   WBC 5.1 09/17/2017   NEUTROABS 3.7 09/17/2017   HGB 12.2 09/17/2017   HCT 36.6 09/17/2017   MCV 89.5 09/17/2017   PLT 85 (L) 09/17/2017      Chemistry      Component Value Date/Time   NA 139 09/17/2017 0838   NA 140 03/08/2017 1223   NA  144 07/25/2014 0444   K 3.5  09/17/2017 0838   K 3.7 07/25/2014 0444   CL 102 09/17/2017 0838   CL 107 07/25/2014 0444   CO2 30 09/17/2017 0838   CO2 30 07/25/2014 0444   BUN 15 09/17/2017 0838   BUN 13 03/08/2017 1223   BUN 8 07/25/2014 0444   CREATININE 0.81 09/17/2017 0838   CREATININE 0.69 07/25/2014 0444      Component Value Date/Time   CALCIUM 9.0 09/17/2017 0838   CALCIUM 8.4 (L) 07/25/2014 0444   ALKPHOS 127 (H) 09/17/2017 0838   ALKPHOS 189 (H) 07/25/2014 0444   AST 28 09/17/2017 0838   AST 96 (H) 07/25/2014 0444   ALT 17 09/17/2017 0838   ALT 123 (H) 07/25/2014 0444   BILITOT 0.3 09/17/2017 0838   BILITOT 0.2 03/08/2017 1223   BILITOT 2.0 (H) 07/25/2014 0444     IMPRESSION: 1. Ill-defined circumferential wall thickening in the distal rectum with perirectal edema/inflammation. The soft tissue nodule contiguous with the right aspect of the rectum on the prior study, likely representing direct distention, has decreased in the interval. Small perirectal lymph nodes persist. 2. No evidence for metastatic disease elsewhere in the abdomen or pelvis   Electronically Signed   By: Misty Stanley M.D.   On: 11/15/2016 17:23  RADIOGRAPHIC STUDIES: I have personally reviewed the radiological images as listed and agreed with the findings in the report. No results found.   ASSESSMENT & PLAN:  Rectal cancer metastasized to intrapelvic lymph node ([place]) # Rectal  Cancer- stage III- s/p neoadjuvant chemoradiation with 5-FU continuous infusion with radiation; s/p APR April 7th 2018;  On adjuvant FOLFOX. Patient tolerating chemotherapy fairly well without major side effects-noted below except thrombocytopenia. Currently s/p 7 cycles.   # proceed with cycle #8 today; Labs today reviewed;  acceptable for treatment today- except platelets- 85. Will dose reduce oxaliplatin per Dr. Rogue Bussing. Plan for total of 10 cycles and will scan after completion of adjuvant chemo.    #rectal drainage- streak of blood and  drainage. F/U with surgery on 10/03/17 Dr. Jamal Collin. Pt says this is chronic since surgery and has improved but not resolved. Ok to monitor. rtc if sx worsen  # PN- G-1/cold sensitivity secondary to oxaliplatin. Resolved.  # Hypokalemia- today 3.5; Stable. Continue oral potassium supplementation  # Thrombocytopenia- 85 sec to ox;[reduce dose to 60 mg/m2)  #left hip pain with radiculopathy- patient grimacing & limping. Obvious discomfort. Positive left side straight leg raise. MRI of lumbar spine and pelvis w and wo contrast asap. Refilled oxycodone and tramadol. Discussed constipation risk with opiates. Encouraged to take miralax and senna prn.   #hypertension- BP elevated today to 163/102. Patient reports significant pain 8/10 unrelieved by Tramadol. PCP manages hptn. Suspect today's reading r/t pain. Will recheck during infusion. Appreciate PCP's management of this issue.   # follow up in 2 weeks/labs/MD/FOLFOX   Orders Placed This Encounter  Procedures  . MR Lumbar Spine W Wo Contrast    Standing Status:   Future    Standing Expiration Date:   09/17/2018    Order Specific Question:   If indicated for the ordered procedure, I authorize the administration of contrast media per Radiology protocol    Answer:   Yes    Order Specific Question:   What is the patient's sedation requirement?    Answer:   No Sedation    Order Specific Question:   Does the patient have a pacemaker or implanted devices?  Answer:   No    Order Specific Question:   Radiology Contrast Protocol - do NOT remove file path    Answer:   [email or web address]    Order Specific Question:   Preferred imaging location?    Answer:   [facility] (table limit-300lbs)  . MR PELVIS W WO CONTRAST    Standing Status:   Future    Standing Expiration Date:   11/17/2018    Order Specific Question:   If indicated for the ordered procedure, I authorize the administration of contrast media per Radiology  protocol    Answer:   Yes    Order Specific Question:   What is the patient's sedation requirement?    Answer:   No Sedation    Order Specific Question:   Does the patient have a pacemaker or implanted devices?    Answer:   No    Order Specific Question:   Radiology Contrast Protocol - do NOT remove file path    Answer:   [email or web address]    Order Specific Question:   Reason for Exam additional comments    Answer:   rectal cancer; left hip pain    Order Specific Question:   Preferred imaging location?    Answer:   [facility] (table limit-300lbs)  . CBC with Differential    Standing Status:   Future    Standing Expiration Date:   09/17/2018  . Comprehensive metabolic panel    Standing Status:   Future    Standing Expiration Date:   09/17/2018      Beckey Rutter, DNP AGNP-C 09/17/17 9:59 AM   Verlon Au, NP 09/17/2017 10:01 AM

## 2017-09-17 NOTE — Assessment & Plan Note (Addendum)
#   Rectal  Cancer- stage III- s/p neoadjuvant chemoradiation with 5-FU continuous infusion with radiation; s/p APR April 7th 2018;  On adjuvant FOLFOX. Patient tolerating chemotherapy fairly well without major side effects-noted below except thrombocytopenia. Currently s/p 7 cycles.   # proceed with cycle #8 today; Labs today reviewed;  acceptable for treatment today- except platelets- 85. Will dose reduce oxaliplatin per Dr. Rogue Bussing. Plan for total of 10 cycles and will scan after completion of adjuvant chemo.    #rectal drainage- streak of blood and drainage. F/U with surgery on 10/03/17 Dr. Jamal Collin. Pt says this is chronic since surgery and has improved but not resolved. Ok to monitor. rtc if sx worsen  # PN- G-1/cold sensitivity secondary to oxaliplatin. Resolved.  # Hypokalemia- today 3.5; Stable. Continue oral potassium supplementation  # Thrombocytopenia- 85 sec to ox;[reduce dose to 60 mg/m2)  #left hip pain with radiculopathy- patient grimacing & limping. Obvious discomfort. Positive left side straight leg raise. MRI of lumbar spine and pelvis w and wo contrast asap. Refilled oxycodone and tramadol. Discussed constipation risk with opiates. Encouraged to take miralax and senna prn.   #hypertension- BP elevated today to 163/102. Patient reports significant pain 8/10 unrelieved by Tramadol. PCP manages hptn. Suspect today's reading r/t pain. Will recheck during infusion. Appreciate PCP's management of this issue.   # follow up in 2 weeks/labs/MD/FOLFOX

## 2017-09-19 ENCOUNTER — Inpatient Hospital Stay: Payer: Medicaid Other

## 2017-09-19 VITALS — BP 134/86 | HR 75 | Temp 97.5°F | Resp 20

## 2017-09-19 DIAGNOSIS — C20 Malignant neoplasm of rectum: Secondary | ICD-10-CM

## 2017-09-19 DIAGNOSIS — C775 Secondary and unspecified malignant neoplasm of intrapelvic lymph nodes: Principal | ICD-10-CM

## 2017-09-19 DIAGNOSIS — Z5111 Encounter for antineoplastic chemotherapy: Secondary | ICD-10-CM | POA: Diagnosis not present

## 2017-09-19 MED ORDER — SODIUM CHLORIDE 0.9% FLUSH
10.0000 mL | INTRAVENOUS | Status: DC | PRN
  Administered 2017-09-19: 10 mL
  Filled 2017-09-19: qty 10

## 2017-09-19 MED ORDER — HEPARIN SOD (PORK) LOCK FLUSH 100 UNIT/ML IV SOLN
500.0000 [IU] | Freq: Once | INTRAVENOUS | Status: AC | PRN
  Administered 2017-09-19: 500 [IU]

## 2017-09-19 MED ORDER — HEPARIN SOD (PORK) LOCK FLUSH 100 UNIT/ML IV SOLN
INTRAVENOUS | Status: AC
  Filled 2017-09-19: qty 5

## 2017-09-20 ENCOUNTER — Ambulatory Visit: Payer: Medicaid Other

## 2017-09-20 ENCOUNTER — Ambulatory Visit: Admission: RE | Admit: 2017-09-20 | Payer: Medicaid Other | Source: Ambulatory Visit

## 2017-09-25 ENCOUNTER — Telehealth: Payer: Self-pay | Admitting: *Deleted

## 2017-09-25 ENCOUNTER — Other Ambulatory Visit: Payer: Self-pay | Admitting: *Deleted

## 2017-09-25 DIAGNOSIS — C20 Malignant neoplasm of rectum: Secondary | ICD-10-CM

## 2017-09-25 DIAGNOSIS — C775 Secondary and unspecified malignant neoplasm of intrapelvic lymph nodes: Principal | ICD-10-CM

## 2017-09-25 DIAGNOSIS — M25552 Pain in left hip: Secondary | ICD-10-CM

## 2017-09-25 NOTE — Telephone Encounter (Signed)
Spoke with Dr. Rogue Bussing v/o to cnl the MRI of pelvis. Will proceed with MRI lumbar.  Orders entered for NM bone scan whole body to further evaluate for bone lesions.    Patient updated on plan of care and informed that her insurance would not approve the MRI pelvis. She gave verbal understanding to proceed with the mR lumbar spine as scheduled tom. She gave verbal understanding that scheduling will contact her with the apt NM bone scan.  msg sent to scheduling team to arrange the NM bone scan.

## 2017-09-25 NOTE — Telephone Encounter (Signed)
-----   Message from Loa Socks sent at 09/25/2017 10:21 AM EDT ----- Regarding: MRI Pelvis Denied Per [employer] MRI Pelvis denied: Based on [employer] Imaging Guidelines, we are unable to approve the requested procedure. Guidelines support advanced imaging in the evaluation of suspected bone metastatic disease for any of the following: 1) bone scan is not feasible or readily available, 2) continued suspicion despite inconclusive or negative bone scan supplemented by plain x-rays or other imaging modalities, 3) new onset of back pain in a patient with Stage 4 cancer, 4) there is neurological compromise, 5) there is a soft tissue component suggested on other imaging modalities or physical exam, or 6) to differentiate neoplastic disease from Paget's disease of bone. The clinical information provided does not describe at least one of these and, therefore, the requested imaging is not indicated at this time. Please advise

## 2017-09-26 ENCOUNTER — Ambulatory Visit: Payer: Medicaid Other

## 2017-09-26 ENCOUNTER — Ambulatory Visit
Admission: RE | Admit: 2017-09-26 | Discharge: 2017-09-26 | Disposition: A | Discharge status: Home / Self Care | Payer: Medicaid Other | Source: Ambulatory Visit | Attending: Internal Medicine | Admitting: Internal Medicine

## 2017-09-26 DIAGNOSIS — M5410 Radiculopathy, site unspecified: Secondary | ICD-10-CM

## 2017-09-26 DIAGNOSIS — C20 Malignant neoplasm of rectum: Secondary | ICD-10-CM

## 2017-09-26 DIAGNOSIS — C775 Secondary and unspecified malignant neoplasm of intrapelvic lymph nodes: Secondary | ICD-10-CM | POA: Insufficient documentation

## 2017-09-26 MED ORDER — GADOBENATE DIMEGLUMINE 529 MG/ML IV SOLN
14.0000 mL | Freq: Once | INTRAVENOUS | Status: AC | PRN
  Administered 2017-09-26: 14 mL via INTRAVENOUS

## 2017-09-27 ENCOUNTER — Telehealth: Payer: Self-pay | Admitting: Internal Medicine

## 2017-09-27 NOTE — Telephone Encounter (Signed)
Spoke to the patient regarding the results of the MRI; radiology recommends MRI sacrum. However, patient getting a bone scan tomorrow; we will wait for the results to make further recommendations.

## 2017-09-28 ENCOUNTER — Ambulatory Visit
Admission: RE | Admit: 2017-09-28 | Discharge: 2017-09-28 | Disposition: A | Discharge status: Home / Self Care | Payer: Medicaid Other | Source: Ambulatory Visit | Attending: Internal Medicine | Admitting: Internal Medicine

## 2017-09-28 ENCOUNTER — Encounter
Admission: RE | Admit: 2017-09-28 | Discharge: 2017-09-28 | Disposition: A | Discharge status: Home / Self Care | Payer: Medicaid Other | Source: Ambulatory Visit | Attending: Internal Medicine | Admitting: Internal Medicine

## 2017-09-28 DIAGNOSIS — C775 Secondary and unspecified malignant neoplasm of intrapelvic lymph nodes: Secondary | ICD-10-CM | POA: Diagnosis not present

## 2017-09-28 DIAGNOSIS — M5136 Other intervertebral disc degeneration, lumbar region: Secondary | ICD-10-CM | POA: Insufficient documentation

## 2017-09-28 DIAGNOSIS — C20 Malignant neoplasm of rectum: Secondary | ICD-10-CM | POA: Insufficient documentation

## 2017-09-28 DIAGNOSIS — M25552 Pain in left hip: Secondary | ICD-10-CM | POA: Diagnosis not present

## 2017-09-28 MED ORDER — TECHNETIUM TC 99M MEDRONATE IV KIT
25.0000 | PACK | Freq: Once | INTRAVENOUS | Status: AC | PRN
  Administered 2017-09-28: 23.28 via INTRAVENOUS

## 2017-10-01 ENCOUNTER — Inpatient Hospital Stay (HOSPITAL_BASED_OUTPATIENT_CLINIC_OR_DEPARTMENT_OTHER): Payer: Medicaid Other | Admitting: Internal Medicine

## 2017-10-01 ENCOUNTER — Inpatient Hospital Stay: Payer: Medicaid Other

## 2017-10-01 VITALS — BP 157/97 | HR 69 | Temp 97.2°F | Resp 18 | Wt 151.8 lb

## 2017-10-01 VITALS — BP 163/101

## 2017-10-01 DIAGNOSIS — Z79899 Other long term (current) drug therapy: Secondary | ICD-10-CM | POA: Diagnosis not present

## 2017-10-01 DIAGNOSIS — M5410 Radiculopathy, site unspecified: Secondary | ICD-10-CM

## 2017-10-01 DIAGNOSIS — C20 Malignant neoplasm of rectum: Secondary | ICD-10-CM | POA: Diagnosis not present

## 2017-10-01 DIAGNOSIS — E876 Hypokalemia: Secondary | ICD-10-CM | POA: Diagnosis not present

## 2017-10-01 DIAGNOSIS — D696 Thrombocytopenia, unspecified: Secondary | ICD-10-CM

## 2017-10-01 DIAGNOSIS — C775 Secondary and unspecified malignant neoplasm of intrapelvic lymph nodes: Secondary | ICD-10-CM | POA: Diagnosis not present

## 2017-10-01 DIAGNOSIS — E041 Nontoxic single thyroid nodule: Secondary | ICD-10-CM | POA: Diagnosis not present

## 2017-10-01 DIAGNOSIS — K5900 Constipation, unspecified: Secondary | ICD-10-CM

## 2017-10-01 DIAGNOSIS — Z5111 Encounter for antineoplastic chemotherapy: Secondary | ICD-10-CM | POA: Diagnosis not present

## 2017-10-01 LAB — COMPREHENSIVE METABOLIC PANEL
ALBUMIN: 3.6 g/dL (ref 3.5–5.0)
ALK PHOS: 126 U/L (ref 38–126)
ALT: 15 U/L (ref 14–54)
ANION GAP: 9 (ref 5–15)
AST: 29 U/L (ref 15–41)
BILIRUBIN TOTAL: 0.4 mg/dL (ref 0.3–1.2)
BUN: 11 mg/dL (ref 6–20)
CALCIUM: 9.1 mg/dL (ref 8.9–10.3)
CO2: 30 mmol/L (ref 22–32)
CREATININE: 0.86 mg/dL (ref 0.44–1.00)
Chloride: 100 mmol/L — ABNORMAL LOW (ref 101–111)
GFR calc Af Amer: >60 mL/min (ref >60)
GFR calc non Af Amer: >60 mL/min (ref >60)
GLUCOSE: 121 mg/dL — AB (ref 65–99)
Potassium: 3.6 mmol/L (ref 3.5–5.1)
SODIUM: 139 mmol/L (ref 135–145)
TOTAL PROTEIN: 7.4 g/dL (ref 6.5–8.1)

## 2017-10-01 LAB — CBC WITH DIFFERENTIAL/PLATELET
BASOS ABS: 0 10*3/uL (ref 0–0.1)
BASOS PCT: 1 %
EOS ABS: 0.1 10*3/uL (ref 0–0.7)
EOS PCT: 1 %
HEMATOCRIT: 37 % (ref 35.0–47.0)
Hemoglobin: 12.5 g/dL (ref 12.0–16.0)
Lymphocytes Relative: 14 %
Lymphs Abs: 0.7 10*3/uL — ABNORMAL LOW (ref 1.0–3.6)
MCH: 30.3 pg (ref 26.0–34.0)
MCHC: 33.8 g/dL (ref 32.0–36.0)
MCV: 89.6 fL (ref 80.0–100.0)
MONO ABS: 0.6 10*3/uL (ref 0.2–0.9)
MONOS PCT: 12 %
NEUTROS ABS: 3.7 10*3/uL (ref 1.4–6.5)
Neutrophils Relative %: 72 %
PLATELETS: 81 10*3/uL — AB (ref 150–440)
RBC: 4.13 MIL/uL (ref 3.80–5.20)
RDW: 17.5 % — ABNORMAL HIGH (ref 11.5–14.5)
WBC: 5.2 10*3/uL (ref 3.6–11.0)

## 2017-10-01 MED ORDER — SODIUM CHLORIDE 0.9 % IV SOLN
2400.0000 mg/m2 | INTRAVENOUS | Status: DC
  Administered 2017-10-01: 4100 mg via INTRAVENOUS
  Filled 2017-10-01: qty 82

## 2017-10-01 MED ORDER — DEXAMETHASONE SODIUM PHOSPHATE 10 MG/ML IJ SOLN
10.0000 mg | Freq: Once | INTRAMUSCULAR | Status: AC
  Administered 2017-10-01: 10 mg via INTRAVENOUS
  Filled 2017-10-01: qty 1

## 2017-10-01 MED ORDER — LEUCOVORIN CALCIUM INJECTION 350 MG
700.0000 mg | Freq: Once | INTRAVENOUS | Status: AC
  Administered 2017-10-01: 700 mg via INTRAVENOUS
  Filled 2017-10-01: qty 35

## 2017-10-01 MED ORDER — DEXTROSE 5 % IV SOLN
Freq: Once | INTRAVENOUS | Status: AC
  Administered 2017-10-01: 12:00:00 via INTRAVENOUS
  Filled 2017-10-01: qty 1000

## 2017-10-01 MED ORDER — OXALIPLATIN CHEMO INJECTION 100 MG/20ML
100.0000 mg | Freq: Once | INTRAVENOUS | Status: AC
  Administered 2017-10-01: 100 mg via INTRAVENOUS
  Filled 2017-10-01: qty 20

## 2017-10-01 MED ORDER — PALONOSETRON HCL INJECTION 0.25 MG/5ML
0.2500 mg | Freq: Once | INTRAVENOUS | Status: AC
  Administered 2017-10-01: 0.25 mg via INTRAVENOUS
  Filled 2017-10-01: qty 5

## 2017-10-01 NOTE — Progress Notes (Signed)
[place] OFFICE PROGRESS NOTE  Patient Care Team: Lynnell Jude, MD as PCP - General (Family Medicine) Christene Lye, MD (General Surgery)  Cancer Staging No matching staging information was found for the patient.   Oncology History   # SEP 2017- RECTAL CA mod diff adeno; STAGE III [no EUS; positive ~71m LN; Dr.sankar] CEA-7; Sep 21st START 5FU-RT [finished NOV 2017]; April 7th APR- 2018- [id number] [5LN]; positive for LVI/PNI [poor response to chemo]; Margins-Negative.  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  female patient with Above history of rectal cancer currently on adjuvant chemotherapy-with FOLFOX. Patient is currently status post 8 cycles Of planned total of 10 cycles.  Patient with interim had a bone scan; and also lumbar spine MRI for ongoing back pain.  She states her back pain is slightly better; she has mild tingling and numbness  Of the extremities. This is not getting any worse. No vomiting. No diarrhea. No sores in the mouth.  Denies any nosebleeds or gum bleeding.  No fevers or chills. Denies any gum bleeding nose bleeds.  REVIEW OF SYSTEMS:  A complete 10 point review of system is done which is negative except mentioned above/history of present illness.   PAST MEDICAL HISTORY :  Past Medical History:  Diagnosis Date  . Arthritis   . Body mass index (BMI) of 24.0-24.9 in adult   . Cardiovascular disease   . Current every day smoker    a. ~35 years - > has cut down to < 3/4 ppd  . Headache   . History of UTI   .  Hypertension   . Midsternal chest pain    a. 05/2014 Stress Echo: Ex time: 8:09, ECG w/o acute changes, EF nl with possible mid and apical anterior HK-->cath recommended.  . Port-A-Cath in place    RIGHT SIDE  . [place]  . [place]    07/2016 tia no neurology followup now seen by pcp  . Syncope and collapse   . Thyroid nodule 2018   FINDINGS CONSISTENT WITH A HURTHLE CELL LESION AND/OR NEOPLASM (BETHESDA CATEGORY IV).    PAST SURGICAL HISTORY :   Past Surgical History:  Procedure Laterality Date  . ABDOMINAL HYSTERECTOMY    . ABDOMINAL PERINEAL BOWEL RESECTION N/A 03/16/2017   Procedure: ABDOMINAL PERINEAL RESECTION;  Surgeon: SChristene Lye MD;  Location: [facility] ORS;  Service: General;  Laterality: N/A;  . APPENDECTOMY  03/16/2017   Procedure: APPENDECTOMY;  Surgeon: SChristene Lye MD;  Location: [facility] ORS;  Service: General;;  . BACK SURGERY     l 3,4,5  . BIOPSY THYROID Right 11/16/2016   FINDINGS CONSISTENT WITH A HURTHLE CELL LESION AND/OR NEOPLASM (BETHESDA CATEGORY IV).  .Marland KitchenCHOLECYSTECTOMY    . COLONOSCOPY WITH PROPOFOL N/A 08/15/2016   Procedure: COLONOSCOPY WITH PROPOFOL;  Surgeon: MLollie Sails MD;  [facility];  Service: Endoscopy;  Laterality: N/A;  . COLONOSCOPY WITH PROPOFOL N/A 11/08/2016   Procedure: COLONOSCOPY WITH PROPOFOL;  Surgeon: SChristene Lye MD;  Location: [facility] ENDOSCOPY;  Service:  Endoscopy;  Laterality: N/A;  . COLOSTOMY N/A 11/23/2016   Procedure: SIGMOID COLOSTOMY;  Surgeon: Christene Lye, MD;  Location: [facility];  Service: General;  Laterality: N/A;  . ERCP    . LAPAROSCOPY N/A 11/23/2016   Procedure: LAPAROSCOPY DIAGNOSTIC;  Surgeon: Christene Lye, MD;  Location: [facility] OR[facility];  Service: General;  Laterality: N/A;  . PARTIAL HYSTERECTOMY    . PORTACATH PLACEMENT N/A 08/18/2016   Procedure: INSERTION PORT-A-CATH;  Surgeon: Christene Lye, MD;  Location: [facility] ORS;  Service: General;   Laterality: N/A;    FAMILY HISTORY :  Mom- gall bladder ca; breast cancer- 34s; mat grand ma- stomach cancer- 36s; sblings- 2 no cancers; 2 daughters- one daughter? Cervical  Family History  Problem Relation Age of Onset  . Breast cancer Mother   . Hyperlipidemia Father   . Heart disease Paternal Grandmother   . Stroke Maternal Grandfather     SOCIAL HISTORY:   Social History  Substance Use Topics  . Smoking status: Former Smoker    Packs/day: 0.25    Years: 35.00    Types: Cigarettes    Quit date: 08/11/2016  . Smokeless tobacco: Never Used  . Alcohol use No     Comment: occasional    ALLERGIES:  is allergic to prednisone.  MEDICATIONS:  Current Outpatient Prescriptions  Medication Sig Dispense Refill  . clopidogrel (PLAVIX) 75 MG tablet Take 75 mg by mouth every evening.     . lidocaine-prilocaine (EMLA) cream APPLY TO AFFECTED AREA AS NEEDED for port a cath access  3  . lisinopril (PRINIVIL,ZESTRIL) 10 MG tablet Take 5 mg by mouth every morning.     . potassium chloride SA (K-DUR,KLOR-CON) 20 MEQ tablet Take 1 tablet (20 mEq total) by mouth 2 (two) times daily. 60 tablet 4  . traMADol (ULTRAM) 50 MG tablet Take 1 tablet (50 mg total) by mouth every 6 (six) hours as needed (for pain.). 30 tablet 0  . venlafaxine XR (EFFEXOR-XR) 37.5 MG 24 hr capsule TAKE ONE CAPSULE BY MOUTH EVERY MORNING WITH BREAKFAST 30 capsule 0  . diphenoxylate-atropine (LOMOTIL) 2.5-0.025 MG tablet Take 1 tablet by mouth 4 (four) times daily as needed for diarrhea or loose stools. Take it along with immodium (Patient not taking: Reported on 09/17/2017) 30 tablet 0  . nitroGLYCERIN (NITROSTAT) 0.4 MG SL tablet Place 1 tablet (0.4 mg total) under the tongue every 5 (five) minutes as needed for chest pain. (Patient not taking: Reported on 08/20/2017) 25 tablet 6  . oxyCODONE (OXY IR/ROXICODONE) 5 MG immediate release tablet Take 1-2 tablets (5-10 mg total) by mouth every 4 (four) hours as needed for moderate  pain or severe pain. (Patient not taking: Reported on 10/01/2017) 30 tablet 0  . prochlorperazine (COMPAZINE) 10 MG tablet Take 1 tablet by mouth 4 (four) times daily as needed for nausea.  2   No current facility-administered medications for this visit.     PHYSICAL EXAMINATION: ECOG PERFORMANCE STATUS: 1 - Symptomatic but completely ambulatory  BP (!) 157/97 (BP Location: Left Arm, Patient Position: Sitting)   Pulse 69   Temp (!) 97.2 F (36.2 C) (Tympanic)   Resp 18   Wt 151 lb 12.8 oz (68.9 kg)   BMI 24.50 kg/m   Filed Weights   10/01/17 1051  Weight: 151 lb 12.8 oz (68.9 kg)    GENERAL: Well-nourished well-developed; Alert, no distress and comfortable.She is alone. EYES: no pallor or icterus OROPHARYNX: no thrush or ulceration; good dentition  NECK: supple, no masses felt LYMPH:  no palpable lymphadenopathy in the cervical, axillary or inguinal regions LUNGS: clear to auscultation and  No wheeze or crackles HEART/CVS: regular rate & rhythm and no murmurs; No lower extremity edema ABDOMEN:abdomen soft, non-tender and normal bowel sounds Musculoskeletal:no cyanosis of digits and no clubbing  PSYCH: alert & oriented x 3; flat affect NEURO: no focal motor/sensory deficits SKIN:  no rashes or significant lesions  LABORATORY DATA:  I have reviewed the data as listed    Component Value Date/Time   NA 139 10/01/2017 0932   NA 140 03/08/2017 1223   NA 144 07/25/2014 0444   K 3.6 10/01/2017 0932   K 3.7 07/25/2014 0444   CL 100 (L) 10/01/2017 0932   CL 107 07/25/2014 0444   CO2 30 10/01/2017 0932   CO2 30 07/25/2014 0444   GLUCOSE 121 (H) 10/01/2017 0932   GLUCOSE 87 07/25/2014 0444   BUN 11 10/01/2017 0932   BUN 13 03/08/2017 1223   BUN 8 07/25/2014 0444   CREATININE 0.86 10/01/2017 0932   CREATININE 0.69 07/25/2014 0444   CALCIUM 9.1 10/01/2017 0932   CALCIUM 8.4 (L) 07/25/2014 0444   PROT 7.4 10/01/2017 0932   PROT 6.8 03/08/2017 1223   PROT 6.5 07/25/2014  0444   ALBUMIN 3.6 10/01/2017 0932   ALBUMIN 4.2 03/08/2017 1223   ALBUMIN 2.6 (L) 07/25/2014 0444   AST 29 10/01/2017 0932   AST 96 (H) 07/25/2014 0444   ALT 15 10/01/2017 0932   ALT 123 (H) 07/25/2014 0444   ALKPHOS 126 10/01/2017 0932   ALKPHOS 189 (H) 07/25/2014 0444   BILITOT 0.4 10/01/2017 0932   BILITOT 0.2 03/08/2017 1223   BILITOT 2.0 (H) 07/25/2014 0444   GFRNONAA >60 10/01/2017 0932   GFRNONAA >60 07/25/2014 0444   GFRAA >60 10/01/2017 0932   GFRAA >60 07/25/2014 0444    No results found for: SPEP, UPEP  Lab Results  Component Value Date   WBC 5.2 10/01/2017   NEUTROABS 3.7 10/01/2017   HGB 12.5 10/01/2017   HCT 37.0 10/01/2017   MCV 89.6 10/01/2017   PLT 81 (L) 10/01/2017      Chemistry      Component Value Date/Time   NA 139 10/01/2017 0932   NA 140 03/08/2017 1223   NA 144 07/25/2014 0444   K 3.6 10/01/2017 0932   K 3.7 07/25/2014 0444   CL 100 (L) 10/01/2017 0932   CL 107 07/25/2014 0444   CO2 30 10/01/2017 0932   CO2 30 07/25/2014 0444   BUN 11 10/01/2017 0932   BUN 13 03/08/2017 1223   BUN 8 07/25/2014 0444   CREATININE 0.86 10/01/2017 0932   CREATININE 0.69 07/25/2014 0444      Component Value Date/Time   CALCIUM 9.1 10/01/2017 0932   CALCIUM 8.4 (L) 07/25/2014 0444   ALKPHOS 126 10/01/2017 0932   ALKPHOS 189 (H) 07/25/2014 0444   AST 29 10/01/2017 0932   AST 96 (H) 07/25/2014 0444   ALT 15 10/01/2017 0932   ALT 123 (H) 07/25/2014 0444   BILITOT 0.4 10/01/2017 0932   BILITOT 0.2 03/08/2017 1223   BILITOT 2.0 (H) 07/25/2014 0444     IMPRESSION: No scintigraphic evidence of osseous metastatic disease.  Abnormal uptake at the LEFT lateral aspect of the lumbar spine at L3-L4 corresponding to degenerative disc disease changes and spur formation on prior radiographs.   Electronically Signed   By: Lavonia Dana M.D.   On: 09/28/2017  15:19  RADIOGRAPHIC STUDIES: I have personally reviewed the radiological images as listed and  agreed with the findings in the report. No results found.   ASSESSMENT & PLAN:  Rectal cancer metastasized to intrapelvic lymph node ([place]) # Rectal  Cancer- stage III- s/p neoadjuvant chemoradiation with 5-FU continuous infusion with radiation; s/p APR April 7th 2018;  On adjuvant FOLFOX. Patient tolerating chemotherapy fairly well without major side effects-noted below except thrombocytopenia. Currently s/p 8 cycles.   # proceed with cycle #9 today; Labs today reviewed;  acceptable for treatment today- except platelets- 81. Plan for total of 10 cycles and will scan after completion of adjuvant chemo.    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  # Hypokalemia- today 3.5; Stable. Continue oral potassium supplementation  # Thrombocytopenia- 81 sec to ox;[reduce dose to 60 mg/m2]  # Thyroid cancer- awaiting definitive surgery; appt with Dr.sankar next week; would recommend surgery in dec 2018; awaiting platelets to improve [atleast 4 weeks post last chemo]  #left hip pain with radiculopathy-  MRI lumbar spine- ? Sacral insufficiency-sacral MRI recommended. /bone scan ? Arthritis. Wants to hold off ortho/imaging evaluation.   # follow up in 2 weeks/labs/MD/FOLFOX.    No orders of the defined types were placed in this encounter.      Cammie Sickle, MD 10/02/2017 7:57 AM

## 2017-10-01 NOTE — Progress Notes (Signed)
BP out of parameters, ok to proceed with tx today per Dr B.

## 2017-10-01 NOTE — Assessment & Plan Note (Addendum)
#   Rectal  Cancer- stage III- s/p neoadjuvant chemoradiation with 5-FU continuous infusion with radiation; s/p APR April 7th 2018;  On adjuvant FOLFOX. Patient tolerating chemotherapy fairly well without major side effects-noted below except thrombocytopenia. Currently s/p 8 cycles.   # proceed with cycle #9 today; Labs today reviewed;  acceptable for treatment today- except platelets- 81. Plan for total of 10 cycles and will scan after completion of adjuvant chemo.    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  # Hypokalemia- today 3.5; Stable. Continue oral potassium supplementation  # Thrombocytopenia- 81 sec to ox;[reduce dose to 60 mg/m2]  # Thyroid cancer- awaiting definitive surgery; appt with Dr.sankar next week; would recommend surgery in dec 2018; awaiting platelets to improve [atleast 4 weeks post last chemo]  #left hip pain with radiculopathy-  MRI lumbar spine- ? Sacral insufficiency-sacral MRI recommended. /bone scan ? Arthritis. Wants to hold off ortho/imaging evaluation.   # follow up in 2 weeks/labs/MD/FOLFOX.

## 2017-10-01 NOTE — Progress Notes (Signed)
Here for follow up. Doing " ok" she stated .stated L side back pain -had MRI  and bone density last week-awaiting results she stated

## 2017-10-03 ENCOUNTER — Ambulatory Visit (INDEPENDENT_AMBULATORY_CARE_PROVIDER_SITE_OTHER): Payer: Medicaid Other | Admitting: General Surgery

## 2017-10-03 ENCOUNTER — Inpatient Hospital Stay: Payer: Medicaid Other

## 2017-10-03 ENCOUNTER — Encounter: Payer: Self-pay | Admitting: General Surgery

## 2017-10-03 VITALS — BP 126/74 | HR 72 | Resp 12 | Ht 66.0 in | Wt 151.0 lb

## 2017-10-03 VITALS — BP 126/83 | HR 66 | Temp 96.8°F | Resp 16

## 2017-10-03 DIAGNOSIS — C775 Secondary and unspecified malignant neoplasm of intrapelvic lymph nodes: Principal | ICD-10-CM

## 2017-10-03 DIAGNOSIS — Z5111 Encounter for antineoplastic chemotherapy: Secondary | ICD-10-CM | POA: Diagnosis not present

## 2017-10-03 DIAGNOSIS — C20 Malignant neoplasm of rectum: Secondary | ICD-10-CM | POA: Diagnosis not present

## 2017-10-03 DIAGNOSIS — D34 Benign neoplasm of thyroid gland: Secondary | ICD-10-CM

## 2017-10-03 MED ORDER — HEPARIN SOD (PORK) LOCK FLUSH 100 UNIT/ML IV SOLN
500.0000 [IU] | Freq: Once | INTRAVENOUS | Status: AC | PRN
  Administered 2017-10-03: 500 [IU]
  Filled 2017-10-03: qty 5

## 2017-10-03 MED ORDER — SODIUM CHLORIDE 0.9% FLUSH
10.0000 mL | INTRAVENOUS | Status: DC | PRN
  Administered 2017-10-03: 10 mL
  Filled 2017-10-03: qty 10

## 2017-10-03 NOTE — Patient Instructions (Signed)
Thyroidectomy A thyroidectomy is a surgery that is done to remove all (total thyroidectomy) or part (subtotal thyroidectomy) of your thyroid gland. The thyroid is a butterfly-shaped gland that is located at the lower front of your neck. It produces a substance that helps to control certain body processes (thyroid hormone). The amount of thyroid gland tissue that is removed during your thyroidectomy depends on the reason you need the procedure. You may have a thyroidectomy to treat conditions including:  Thyroid nodules.  Thyroid cancer.  Benign thyroid tumors.  Goiter.  Overactive thyroid gland (hyperthyroidism).  There are different ways to do a thyroidectomy:  Conventional thyroidectomy (open thyroidectomy). This procedure is the most common. In this procedure, the thyroid gland is removed through one surgical cut (incision) in the neck.  Endoscopic thyroidectomy. This procedure is less invasive. In this procedure, there may be several smaller incisions in the neck, chest, or armpit. The surgeon uses a tiny camera and other assistive tools to remove the thyroid gland.  Tell a health care provider about:  Any allergies you have.  All medicines you are taking, including vitamins, herbs, eye drops, creams, and over-the-counter medicines.  Any problems you or family members have had with anesthetic medicines.  Any blood disorders you have.  Any surgeries you have had.  Any medical conditions you have. What are the risks? Generally, this is a safe procedure. However, problems can occur and include:  A decrease in parathyroid hormone levels (hypoparathyroidism). Your parathyroid glands are located behind your thyroid gland, and they maintain the calcium level in your body. If these glands are damaged during surgery, your calcium level will drop. This will make your nerves irritable and cause muscle spasms.  An increase in thyroid hormone.  Damage to the nerves of your voice box  (larynx).  Bleeding.  Infection at the site of the incision or incisions.  Temporary breathing difficulties. This is a very rare complication. It usually goes away within weeks.  What happens before the procedure?  Your health care provider will perform a physical exam and assess your voice for vocal changes.  Ask your health care provider about: ? Changing or stopping your regular medicines. This is especially important if you are taking diabetes medicines or blood thinners. ? Taking medicines such as aspirin and ibuprofen. These medicines can thin your blood. Do not take these medicines before your procedure if your health care provider instructs you not to.  Follow instructions from your health care provider about eating or drinking restrictions. What happens during the procedure? You will be given a medicine that makes you go to sleep (general anesthetic). Depending on which type of thyroidectomy you have, this is what may happen during the procedure: Conventional Thyroidectomy  The surgeon will make an incision in the center of your lower neck.  The muscles in your neck will be separated to reveal your thyroid gland.  Part or all of your thyroid gland will be removed.  You may need a tube (catheter) at the incision site to drain blood and fluids that accumulate under the skin after the procedure.The catheter may have to stay in place for a day or two after the procedure.  The incision will be closed with stitches (sutures). Endoscopic Thyroidectomy  The surgeon will make several small incisions in your neck, chest, or armpit.  The surgeon will use a narrow tube with a light and camera at the end (endoscope). The surgeon will insert the endoscope into an incision.  Part or   all of your thyroid gland will be removed.  You may need a catheter at the incision site to drain blood and fluids that accumulate under the skin after the procedure.The catheter may have to stay in  place for a day or two after the procedure.  The incision will be closed with sutures. What happens after the procedure?  Your blood pressure, heart rate, breathing rate, and blood oxygen level will be monitored often until the medicines you were given have worn off.  Depending on the type of thyroidectomy you had, you may have: ? A swollen neck. ? Some mild neck pain. ? A slightly sore throat. ? A weak voice.  You will not be able to eat or drink until your health care provider says it is okay.  You may have a blood test to check the level of calcium in your body.  If you had a catheter put in during the procedure, it will usually be removed the next day. This information is not intended to replace advice given to you by your health care provider. Make sure you discuss any questions you have with your health care provider. Document Released: 05/23/2001 Document Revised: 07/30/2016 Document Reviewed: 04/29/2014 [employer] Interactive Patient Education  2018 [employer]  

## 2017-10-03 NOTE — Progress Notes (Signed)
Patient ID: Rachel [name], female   DOB: 10/23/1956, 61 y.o.   MRN: [id number]  Chief Complaint  Patient presents with  . Follow-up    rectal cancer   . Thyroid Nodule    HPI Rachel [name] is a 61 y.o. female here today for her follow up rectal cancer and thyroid nodule. Patient states she is doing okay.  Last saw Dr. Rogue Bussing 10/01/17 for rectal cancer, currently on 9/10 cycle with FOLFOX. Plan to re-scan after 10th cycle. Platelets have been decreased on chemo. Was treated with neoadjuvant chemoradiation, s/p abdomino-perineal resection.  HPI  Past Medical History:  Diagnosis Date  . Arthritis   . Body mass index (BMI) of 24.0-24.9 in adult   . Cardiovascular disease   . Current every day smoker    a. ~35 years - > has cut down to < 3/4 ppd  . Headache   . History of UTI   . Hypertension   . Midsternal chest pain    a. 05/2014 Stress Echo: Ex time: 8:09, ECG w/o acute changes, EF nl with possible mid and apical anterior HK-->cath recommended.  . Port-A-Cath in place    RIGHT SIDE  . Rectal cancer ([place]) 2017  . Stroke ([place])    07/2016 tia no neurology followup now seen by pcp  . Syncope and collapse   . Thyroid nodule 2018   FINDINGS CONSISTENT WITH A HURTHLE CELL LESION AND/OR NEOPLASM (BETHESDA CATEGORY IV).    Past Surgical History:  Procedure Laterality Date  . ABDOMINAL HYSTERECTOMY    . ABDOMINAL PERINEAL BOWEL RESECTION N/A 03/16/2017   Procedure: ABDOMINAL PERINEAL RESECTION;  Surgeon: Christene Lye, MD;  Location: [facility] ORS;  Service: General;  Laterality: N/A;  . APPENDECTOMY  03/16/2017   Procedure: APPENDECTOMY;  Surgeon: Christene Lye, MD;  Location: [facility] ORS;  Service: General;;  . BACK SURGERY     l 3,4,5  . BIOPSY THYROID Right 11/16/2016   FINDINGS CONSISTENT WITH A HURTHLE CELL LESION AND/OR NEOPLASM (BETHESDA CATEGORY IV).  [name] CHOLECYSTECTOMY    . COLONOSCOPY WITH PROPOFOL N/A 08/15/2016   Procedure: COLONOSCOPY WITH PROPOFOL;  Surgeon:  Lollie Sails, MD;  Location: [facility] ENDOSCOPY;  Service: Endoscopy;  Laterality: N/A;  . COLONOSCOPY WITH PROPOFOL N/A 11/08/2016   Procedure: COLONOSCOPY WITH PROPOFOL;  Surgeon: Christene Lye, MD;  Location: [facility] ENDOSCOPY;  Service: Endoscopy;  Laterality: N/A;  . COLOSTOMY N/A 11/23/2016   Procedure: SIGMOID COLOSTOMY;  Surgeon: Christene Lye, MD;  Location: [facility] ORS;  Service: General;  Laterality: N/A;  . ERCP    . LAPAROSCOPY N/A 11/23/2016   Procedure: LAPAROSCOPY DIAGNOSTIC;  Surgeon: Christene Lye, MD;  Location: [facility] ORS;  Service: General;  Laterality: N/A;  . PARTIAL HYSTERECTOMY    . PORTACATH PLACEMENT N/A 08/18/2016   Procedure: INSERTION PORT-A-CATH;  Surgeon: Christene Lye, MD;  Location: [facility] ORS;  Service: General;  Laterality: N/A;    Family History  Problem Relation Age of Onset  . Breast cancer Mother   . Hyperlipidemia Father   . Heart disease Paternal Grandmother   . Stroke Maternal Grandfather     Social History Social History  Substance Use Topics  . Smoking status: Former Smoker    Packs/day: 0.25    Years: 35.00    Types: Cigarettes    Quit date: 08/11/2016  . Smokeless tobacco: Never Used  . Alcohol use No     Comment: occasional    Allergies  Allergen Reactions  .  Prednisone Other (See Comments)    BLISTERS IN MOUTH AND ON TONGUE    Current Outpatient Prescriptions  Medication Sig Dispense Refill  . clopidogrel (PLAVIX) 75 MG tablet Take 75 mg by mouth every evening.     . diphenoxylate-atropine (LOMOTIL) 2.5-0.025 MG tablet Take 1 tablet by mouth 4 (four) times daily as needed for diarrhea or loose stools. Take it along with immodium (Patient not taking: Reported on 09/17/2017) 30 tablet 0  . lidocaine-prilocaine (EMLA) cream APPLY TO AFFECTED AREA AS NEEDED for port a cath access  3  . lisinopril (PRINIVIL,ZESTRIL) 10 MG tablet Take 5 mg by mouth every morning.     . nitroGLYCERIN (NITROSTAT) 0.4 MG SL tablet  Place 1 tablet (0.4 mg total) under the tongue every 5 (five) minutes as needed for chest pain. (Patient not taking: Reported on 08/20/2017) 25 tablet 6  . oxyCODONE (OXY IR/ROXICODONE) 5 MG immediate release tablet Take 1-2 tablets (5-10 mg total) by mouth every 4 (four) hours as needed for moderate pain or severe pain. (Patient not taking: Reported on 10/01/2017) 30 tablet 0  . potassium chloride SA (K-DUR,KLOR-CON) 20 MEQ tablet Take 1 tablet (20 mEq total) by mouth 2 (two) times daily. 60 tablet 4  . prochlorperazine (COMPAZINE) 10 MG tablet Take 1 tablet by mouth 4 (four) times daily as needed for nausea.  2  . traMADol (ULTRAM) 50 MG tablet Take 1 tablet (50 mg total) by mouth every 6 (six) hours as needed (for pain.). 30 tablet 0  . venlafaxine XR (EFFEXOR-XR) 37.5 MG 24 hr capsule TAKE ONE CAPSULE BY MOUTH EVERY MORNING WITH BREAKFAST 30 capsule 0   No current facility-administered medications for this visit.     Review of Systems Review of Systems  Constitutional: Negative.   Respiratory: Negative.   Cardiovascular: Negative.     Blood pressure 126/74, pulse 72, resp. rate 12, height 5\' 6"  (1.676 m), weight 151 lb (68.5 kg).  Physical Exam Physical Exam  Constitutional: She is oriented to person, place, and time. She appears well-developed and well-nourished.  Eyes: Conjunctivae are normal. No scleral icterus.  Neck: Neck supple.  Cardiovascular: Normal rate, regular rhythm and normal heart sounds.   Pulmonary/Chest: Effort normal and breath sounds normal.  Abdominal: Soft. Bowel sounds are normal. There is no tenderness.  Colostomy bag noted in LLQ   Genitourinary:     Lymphadenopathy:    She has no cervical adenopathy.  Neurological: She is alert and oriented to person, place, and time.  Skin: Skin is warm and dry.    Data Reviewed Oncology notes, previous notes, and thyroid US, and thyroid pathology results  Assessment    Carcinoma of rectum, Stage III-  post-operative course stable. S/p neoadjuvant chemoradiation and abdomino-perineal resection. Oncology re-evaluation after 10th round of chemotherapy.   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  Will discuss plan for thyroidectomy in the beginning of Dec with oncology after completion of radiation. Current platelets 81, would like improvement before surgery.     Plan  Patient to have a thyroidectomy in December, 2018. Follow up in November. Will consult with Dr. Rogue Bussing regarding chemotherapy plans.     HPI, Physical Exam, Assessment and Plan have been scribed under the direction and in the presence of Mckinley Jewel, MD  [name], CMA  I have completed the exam and reviewed the above documentation for accuracy and completeness.  I agree with the above.  Haematologist has been used and any  errors in dictation or transcription are unintentional.  Tavi Hoogendoorn G. Jamal Collin, M.D., F.A.C.S.   Junie Panning G 10/03/2017, 1:04 PM  Patient's surgery has been scheduled for 11[facility]13-17 [facility] Patient has been asked to stop Plavix five days prior to procedure and start an 81 mg aspirin once Plavix is stopped.  Dominga Ferry, CMA

## 2017-10-15 ENCOUNTER — Inpatient Hospital Stay: Payer: Medicaid Other

## 2017-10-15 ENCOUNTER — Inpatient Hospital Stay: Payer: Medicaid Other | Attending: Internal Medicine | Admitting: Internal Medicine

## 2017-10-15 ENCOUNTER — Encounter: Payer: Self-pay | Admitting: Internal Medicine

## 2017-10-15 VITALS — BP 128/82 | HR 74 | Resp 18

## 2017-10-15 VITALS — BP 162/107 | HR 76 | Temp 97.4°F | Resp 16 | Wt 151.4 lb

## 2017-10-15 DIAGNOSIS — T451X5S Adverse effect of antineoplastic and immunosuppressive drugs, sequela: Secondary | ICD-10-CM

## 2017-10-15 DIAGNOSIS — M5136 Other intervertebral disc degeneration, lumbar region: Secondary | ICD-10-CM | POA: Insufficient documentation

## 2017-10-15 DIAGNOSIS — D696 Thrombocytopenia, unspecified: Secondary | ICD-10-CM | POA: Insufficient documentation

## 2017-10-15 DIAGNOSIS — M549 Dorsalgia, unspecified: Secondary | ICD-10-CM

## 2017-10-15 DIAGNOSIS — Z87440 Personal history of urinary (tract) infections: Secondary | ICD-10-CM | POA: Insufficient documentation

## 2017-10-15 DIAGNOSIS — I950 Idiopathic hypotension: Secondary | ICD-10-CM

## 2017-10-15 DIAGNOSIS — Z5111 Encounter for antineoplastic chemotherapy: Secondary | ICD-10-CM | POA: Diagnosis present

## 2017-10-15 DIAGNOSIS — C73 Malignant neoplasm of thyroid gland: Secondary | ICD-10-CM | POA: Insufficient documentation

## 2017-10-15 DIAGNOSIS — C775 Secondary and unspecified malignant neoplasm of intrapelvic lymph nodes: Secondary | ICD-10-CM

## 2017-10-15 DIAGNOSIS — C20 Malignant neoplasm of rectum: Secondary | ICD-10-CM | POA: Insufficient documentation

## 2017-10-15 DIAGNOSIS — Z87891 Personal history of nicotine dependence: Secondary | ICD-10-CM

## 2017-10-15 DIAGNOSIS — Z8673 Personal history of transient ischemic attack (TIA), and cerebral infarction without residual deficits: Secondary | ICD-10-CM | POA: Insufficient documentation

## 2017-10-15 DIAGNOSIS — I2510 Atherosclerotic heart disease of native coronary artery without angina pectoris: Secondary | ICD-10-CM | POA: Diagnosis not present

## 2017-10-15 DIAGNOSIS — Z7902 Long term (current) use of antithrombotics/antiplatelets: Secondary | ICD-10-CM | POA: Diagnosis not present

## 2017-10-15 DIAGNOSIS — Z809 Family history of malignant neoplasm, unspecified: Secondary | ICD-10-CM | POA: Insufficient documentation

## 2017-10-15 DIAGNOSIS — I10 Essential (primary) hypertension: Secondary | ICD-10-CM | POA: Insufficient documentation

## 2017-10-15 DIAGNOSIS — Z923 Personal history of irradiation: Secondary | ICD-10-CM | POA: Diagnosis not present

## 2017-10-15 DIAGNOSIS — Z79899 Other long term (current) drug therapy: Secondary | ICD-10-CM | POA: Diagnosis not present

## 2017-10-15 DIAGNOSIS — Z90710 Acquired absence of both cervix and uterus: Secondary | ICD-10-CM | POA: Insufficient documentation

## 2017-10-15 DIAGNOSIS — R970 Elevated carcinoembryonic antigen [CEA]: Secondary | ICD-10-CM | POA: Diagnosis not present

## 2017-10-15 DIAGNOSIS — D6959 Other secondary thrombocytopenia: Secondary | ICD-10-CM | POA: Insufficient documentation

## 2017-10-15 DIAGNOSIS — R112 Nausea with vomiting, unspecified: Secondary | ICD-10-CM | POA: Diagnosis not present

## 2017-10-15 DIAGNOSIS — E876 Hypokalemia: Secondary | ICD-10-CM | POA: Diagnosis not present

## 2017-10-15 LAB — CBC WITH DIFFERENTIAL/PLATELET
Basophils Absolute: 0.1 10*3/uL (ref 0–0.1)
Basophils Relative: 2 %
EOS ABS: 0.1 10*3/uL (ref 0–0.7)
Eosinophils Relative: 2 %
HEMATOCRIT: 37.5 % (ref 35.0–47.0)
HEMOGLOBIN: 12.6 g/dL (ref 12.0–16.0)
LYMPHS ABS: 0.6 10*3/uL — AB (ref 1.0–3.6)
LYMPHS PCT: 13 %
MCH: 30.3 pg (ref 26.0–34.0)
MCHC: 33.4 g/dL (ref 32.0–36.0)
MCV: 90.5 fL (ref 80.0–100.0)
MONOS PCT: 13 %
Monocytes Absolute: 0.6 10*3/uL (ref 0.2–0.9)
NEUTROS PCT: 71 %
Neutro Abs: 3.3 10*3/uL (ref 1.4–6.5)
Platelets: 77 10*3/uL — ABNORMAL LOW (ref 150–440)
RBC: 4.15 MIL/uL (ref 3.80–5.20)
RDW: 17.5 % — ABNORMAL HIGH (ref 11.5–14.5)
WBC: 4.6 10*3/uL (ref 3.6–11.0)

## 2017-10-15 LAB — COMPREHENSIVE METABOLIC PANEL
ALK PHOS: 131 U/L — AB (ref 38–126)
ALT: 17 U/L (ref 14–54)
ANION GAP: 6 (ref 5–15)
AST: 27 U/L (ref 15–41)
Albumin: 3.5 g/dL (ref 3.5–5.0)
BILIRUBIN TOTAL: 0.5 mg/dL (ref 0.3–1.2)
BUN: 11 mg/dL (ref 6–20)
CALCIUM: 9.1 mg/dL (ref 8.9–10.3)
CO2: 29 mmol/L (ref 22–32)
CREATININE: 0.62 mg/dL (ref 0.44–1.00)
Chloride: 103 mmol/L (ref 101–111)
Glucose, Bld: 102 mg/dL — ABNORMAL HIGH (ref 65–99)
Potassium: 3.6 mmol/L (ref 3.5–5.1)
SODIUM: 138 mmol/L (ref 135–145)
TOTAL PROTEIN: 7.2 g/dL (ref 6.5–8.1)

## 2017-10-15 MED ORDER — SODIUM CHLORIDE 0.9 % IV SOLN
2400.0000 mg/m2 | INTRAVENOUS | Status: DC
  Administered 2017-10-15: 4100 mg via INTRAVENOUS
  Filled 2017-10-15: qty 82

## 2017-10-15 MED ORDER — DEXTROSE 5 % IV SOLN
700.0000 mg | Freq: Once | INTRAVENOUS | Status: AC
  Administered 2017-10-15: 700 mg via INTRAVENOUS
  Filled 2017-10-15: qty 35

## 2017-10-15 MED ORDER — OXALIPLATIN CHEMO INJECTION 100 MG/20ML
100.0000 mg | Freq: Once | INTRAVENOUS | Status: AC
  Administered 2017-10-15: 100 mg via INTRAVENOUS
  Filled 2017-10-15: qty 20

## 2017-10-15 MED ORDER — PALONOSETRON HCL INJECTION 0.25 MG/5ML
0.2500 mg | Freq: Once | INTRAVENOUS | Status: AC
  Administered 2017-10-15: 0.25 mg via INTRAVENOUS
  Filled 2017-10-15: qty 5

## 2017-10-15 MED ORDER — HYDRALAZINE HCL 50 MG PO TABS
50.0000 mg | ORAL_TABLET | Freq: Once | ORAL | Status: AC
  Administered 2017-10-15: 50 mg via ORAL
  Filled 2017-10-15: qty 1

## 2017-10-15 MED ORDER — DEXTROSE 5 % IV SOLN
Freq: Once | INTRAVENOUS | Status: AC
  Administered 2017-10-15: 10:00:00 via INTRAVENOUS
  Filled 2017-10-15: qty 1000

## 2017-10-15 MED ORDER — DEXAMETHASONE SODIUM PHOSPHATE 10 MG/ML IJ SOLN
10.0000 mg | Freq: Once | INTRAMUSCULAR | Status: AC
  Administered 2017-10-15: 10 mg via INTRAVENOUS
  Filled 2017-10-15: qty 1

## 2017-10-15 NOTE — Progress Notes (Signed)
Patient is here today for a follow up. Patient states no new concerns today.  

## 2017-10-15 NOTE — Progress Notes (Signed)
[place] OFFICE PROGRESS NOTE  Patient Care Team: Lynnell Jude, MD as PCP - General (Family Medicine) Christene Lye, MD (General Surgery)  Cancer Staging No matching staging information was found for the patient.   Oncology History   # SEP 2017- RECTAL CA mod diff adeno; STAGE III [no EUS; positive ~56m LN; Dr.sankar] CEA-7; Sep 21st START 5FU-RT [finished NOV 2017]; April 7th APR- 2018- [id number] [5LN]; positive for LVI/PNI [poor response to chemo]; Margins-Negative.  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  female patient with Above history of rectal cancer currently on adjuvant chemotherapy-with FOLFOX. Patient is currently status post 9 cycles Of planned total of 10 cycles.  Patient complains of mild back pain. Overall not getting any worse. She complains of mild tingling and numbness in the extremities. Again not any worse. She had episode of nausea with vomiting; she had to take the anti-emetics after chemotherapy. Currently resolved. She is awaiting to have surgery for her thyroid cancer in the beginning of December 2018.  She denies any significant headaches. Denies any leg swelling. Denies any unusual shortness of breath or chest pain. She states her blood pressure is under range of [id number] at Home. She takes 5 mg of lisinopril  REVIEW OF SYSTEMS:  A complete 10 point review of system is done which is negative except mentioned above/history of present illness.   PAST MEDICAL HISTORY :  Past Medical History:   Diagnosis Date  . Arthritis   . Body mass index (BMI) of 24.0-24.9 in adult   . Cardiovascular disease   . Current every day smoker    a. ~35 years - > has cut down to < 3/4 ppd  . Headache   . History of UTI   . Hypertension   . Midsternal chest pain    a. 05/2014 Stress Echo: Ex time: 8:09, ECG w/o acute changes, EF nl with possible mid and apical anterior HK-->cath recommended.  . Port-A-Cath in place    RIGHT SIDE  . [place]  . [place]    07/2016 tia no neurology followup now seen by pcp  . Syncope and collapse   . Thyroid nodule 2018   FINDINGS CONSISTENT WITH A HURTHLE CELL LESION AND/OR NEOPLASM (BETHESDA CATEGORY IV).    PAST SURGICAL HISTORY :   Past Surgical History:  Procedure Laterality Date  . ABDOMINAL HYSTERECTOMY    . BACK SURGERY     l 3,4,5  . BIOPSY THYROID Right 11/16/2016   FINDINGS CONSISTENT WITH A HURTHLE CELL LESION AND/OR NEOPLASM (BETHESDA CATEGORY IV).  .Marland KitchenCHOLECYSTECTOMY    . ERCP    . PARTIAL HYSTERECTOMY      FAMILY HISTORY :  Mom- gall bladder ca; breast cancer- 769s mat grand ma- stomach cancer- 697s sblings- 2 no cancers; 2 daughters- one daughter? Cervical  Family History  Problem Relation Age of Onset  . Breast cancer Mother   . Hyperlipidemia Father   . Heart disease Paternal Grandmother   . Stroke Maternal Grandfather     SOCIAL  HISTORY:   Social History   Tobacco Use  . Smoking status: Former Smoker    Packs/day: 0.25    Years: 35.00    Pack years: 8.75    Types: Cigarettes    Last attempt to quit: 08/11/2016    Years since quitting: 1.1  . Smokeless tobacco: Never Used  Substance Use Topics  . Alcohol use: No    Comment: occasional  . Drug use: No    ALLERGIES:  is allergic to prednisone.  MEDICATIONS:  Current Outpatient Medications  Medication Sig Dispense Refill  . clopidogrel (PLAVIX) 75 MG tablet Take 75 mg by mouth every evening.     . lidocaine-prilocaine (EMLA) cream APPLY TO  AFFECTED AREA AS NEEDED for port a cath access  3  . lisinopril (PRINIVIL,ZESTRIL) 10 MG tablet Take 5 mg by mouth every morning.     [name] oxyCODONE (OXY IR/ROXICODONE) 5 MG immediate release tablet Take 1-2 tablets (5-10 mg total) by mouth every 4 (four) hours as needed for moderate pain or severe pain. 30 tablet 0  . potassium chloride SA (K-DUR,KLOR-CON) 20 MEQ tablet Take 1 tablet (20 mEq total) by mouth 2 (two) times daily. 60 tablet 4  . prochlorperazine (COMPAZINE) 10 MG tablet Take 1 tablet by mouth 4 (four) times daily as needed for nausea.  2  . traMADol (ULTRAM) 50 MG tablet Take 1 tablet (50 mg total) by mouth every 6 (six) hours as needed (for pain.). 30 tablet 0  . venlafaxine XR (EFFEXOR-XR) 37.5 MG 24 hr capsule TAKE ONE CAPSULE BY MOUTH EVERY MORNING WITH BREAKFAST 30 capsule 0  . diphenoxylate-atropine (LOMOTIL) 2.5-0.025 MG tablet Take 1 tablet by mouth 4 (four) times daily as needed for diarrhea or loose stools. Take it along with immodium (Patient not taking: Reported on 10/15/2017) 30 tablet 0  . nitroGLYCERIN (NITROSTAT) 0.4 MG SL tablet Place 1 tablet (0.4 mg total) under the tongue every 5 (five) minutes as needed for chest pain. (Patient not taking: Reported on 10/15/2017) 25 tablet 6   No current facility-administered medications for this visit.    Facility-Administered Medications Ordered in Other Visits  Medication Dose Route Frequency Provider Last Rate Last Dose  . fluorouracil (ADRUCIL) 4,100 mg in sodium chloride 0.9 % 68 mL chemo infusion  2,400 mg/m2 (Treatment Plan Recorded) Intravenous 1 day or 1 dose Charlaine Dalton R, MD      . leucovorin 700 mg in dextrose 5 % 250 mL infusion  700 mg Intravenous Once Cammie Sickle, MD   Stopped at 10/15/17 1242  . oxaliplatin (ELOXATIN) 100 mg in dextrose 5 % 500 mL chemo infusion  100 mg Intravenous Once Cammie Sickle, MD   Stopped at 10/15/17 1241    PHYSICAL EXAMINATION: ECOG PERFORMANCE STATUS: 1 -  Symptomatic but completely ambulatory  BP (!) 162/107 (BP Location: Right Arm)   Pulse 76   Temp (!) 97.4 F (36.3 C) (Tympanic)   Resp 16   Wt 151 lb 6.4 oz (68.7 kg)   BMI 24.44 kg/m   Filed Weights   10/15/17 0850  Weight: 151 lb 6.4 oz (68.7 kg)    GENERAL: Well-nourished well-developed; Alert, no distress and comfortable.She is alone. EYES: no pallor or icterus OROPHARYNX: no thrush or ulceration; good dentition  NECK: supple, no masses felt LYMPH:  no palpable lymphadenopathy in the cervical, axillary or inguinal regions LUNGS: clear to auscultation and  No wheeze or crackles HEART/CVS: regular rate & rhythm and no murmurs; No lower  extremity edema ABDOMEN:abdomen soft, non-tender and normal bowel sounds Musculoskeletal:no cyanosis of digits and no clubbing  PSYCH: alert & oriented x 3; flat affect NEURO: no focal motor/sensory deficits SKIN:  no rashes or significant lesions  LABORATORY DATA:  I have reviewed the data as listed    Component Value Date/Time   NA 138 10/15/2017 0833   NA 140 03/08/2017 1223   NA 144 07/25/2014 0444   K 3.6 10/15/2017 0833   K 3.7 07/25/2014 0444   CL 103 10/15/2017 0833   CL 107 07/25/2014 0444   CO2 29 10/15/2017 0833   CO2 30 07/25/2014 0444   GLUCOSE 102 (H) 10/15/2017 0833   GLUCOSE 87 07/25/2014 0444   BUN 11 10/15/2017 0833   BUN 13 03/08/2017 1223   BUN 8 07/25/2014 0444   CREATININE 0.62 10/15/2017 0833   CREATININE 0.69 07/25/2014 0444   CALCIUM 9.1 10/15/2017 0833   CALCIUM 8.4 (L) 07/25/2014 0444   PROT 7.2 10/15/2017 0833   PROT 6.8 03/08/2017 1223   PROT 6.5 07/25/2014 0444   ALBUMIN 3.5 10/15/2017 0833   ALBUMIN 4.2 03/08/2017 1223   ALBUMIN 2.6 (L) 07/25/2014 0444   AST 27 10/15/2017 0833   AST 96 (H) 07/25/2014 0444   ALT 17 10/15/2017 0833   ALT 123 (H) 07/25/2014 0444   ALKPHOS 131 (H) 10/15/2017 0833   ALKPHOS 189 (H) 07/25/2014 0444   BILITOT 0.5 10/15/2017 0833   BILITOT 0.2 03/08/2017 1223    BILITOT 2.0 (H) 07/25/2014 0444   GFRNONAA >60 10/15/2017 0833   GFRNONAA >60 07/25/2014 0444   GFRAA >60 10/15/2017 0833   GFRAA >60 07/25/2014 0444    No results found for: SPEP, UPEP  Lab Results  Component Value Date   WBC 4.6 10/15/2017   NEUTROABS 3.3 10/15/2017   HGB 12.6 10/15/2017   HCT 37.5 10/15/2017   MCV 90.5 10/15/2017   PLT 77 (L) 10/15/2017      Chemistry      Component Value Date/Time   NA 138 10/15/2017 0833   NA 140 03/08/2017 1223   NA 144 07/25/2014 0444   K 3.6 10/15/2017 0833   K 3.7 07/25/2014 0444   CL 103 10/15/2017 0833   CL 107 07/25/2014 0444   CO2 29 10/15/2017 0833   CO2 30 07/25/2014 0444   BUN 11 10/15/2017 0833   BUN 13 03/08/2017 1223   BUN 8 07/25/2014 0444   CREATININE 0.62 10/15/2017 0833   CREATININE 0.69 07/25/2014 0444      Component Value Date/Time   CALCIUM 9.1 10/15/2017 0833   CALCIUM 8.4 (L) 07/25/2014 0444   ALKPHOS 131 (H) 10/15/2017 0833   ALKPHOS 189 (H) 07/25/2014 0444   AST 27 10/15/2017 0833   AST 96 (H) 07/25/2014 0444   ALT 17 10/15/2017 0833   ALT 123 (H) 07/25/2014 0444   BILITOT 0.5 10/15/2017 0833   BILITOT 0.2 03/08/2017 1223   BILITOT 2.0 (H) 07/25/2014 0444     IMPRESSION: No scintigraphic evidence of osseous metastatic disease.  Abnormal uptake at the LEFT lateral aspect of the lumbar spine at L3-L4 corresponding to degenerative disc disease changes and spur formation on prior radiographs.   Electronically Signed   By: Lavonia Dana M.D.   On: 09/28/2017 15:19  RADIOGRAPHIC STUDIES: I have personally reviewed the radiological images as listed and agreed with the findings in the report. No results found.   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 Patient tolerating chemotherapy fairly well without  major side effects-noted below except thrombocytopenia. Currently s/Rachel 9 cycles.   # proceed with cycle #10 today [last treatment today]; Labs today reviewed;  acceptable for treatment today- except platelets- 76.  # Elevated Blood pressure- 140s/ 90-100s at home- recommend take 10 mg once a day. Also given hydralazine 50 mg PO x1 now. Spoke to pharmacist.   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  # Hypokalemia- today 3.5; Stable. Continue oral potassium supplementation  # Thrombocytopenia- 77 sec to ox;[reduce dose to 60 mg/m2]  # Thyroid cancer- awaiting definitive surgery; surgery planned on dec 4th.  #left hip pain with radiculopathy-  MRI lumbar spine- ? Sacral insufficiency-sacral MRI recommended. /bone scan ? Arthritis. Wants to hold off ortho/imaging evaluation.   # follow up Friday/nov 30th/labs.    Orders Placed This Encounter  Procedures  . CBC with Differential/Platelet    Standing Status:   Future    Standing Expiration Date:   10/15/2018  . Comprehensive metabolic panel    Standing Status:   Future    Standing Expiration Date:   10/15/2018  . CEA    Standing Status:   Future    Standing Expiration Date:   10/15/2018       Cammie Sickle, MD 10/15/2017 12:32 PM

## 2017-10-15 NOTE — Progress Notes (Signed)
Plt =77.  MD ok to proceed with treatment today

## 2017-10-15 NOTE — Assessment & Plan Note (Addendum)
#   Rectal  Cancer- stage III- s/p neoadjuvant chemoradiation with 5-FU continuous infusion with radiation; s/p APR April 7th 2018;  On adjuvant FOLFOX. Patient tolerating chemotherapy fairly well without major side effects-noted below except thrombocytopenia. Currently s/p 9 cycles.   # proceed with cycle #10 today [last treatment today]; Labs today reviewed;  acceptable for treatment today- except platelets- 76.  # Elevated Blood pressure- 140s/ 90-100s at home- recommend take 10 mg once a day. Also given hydralazine 50 mg PO x1 now. Spoke to pharmacist.   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  # Hypokalemia- today 3.5; Stable. Continue oral potassium supplementation  # Thrombocytopenia- 77 sec to ox;[reduce dose to 60 mg/m2]  # Thyroid cancer- awaiting definitive surgery; surgery planned on dec 4th.  #left hip pain with radiculopathy-  MRI lumbar spine- ? Sacral insufficiency-sacral MRI recommended. /bone scan ? Arthritis. Wants to hold off ortho/imaging evaluation.   # follow up Friday/nov 30th/labs.

## 2017-10-17 ENCOUNTER — Inpatient Hospital Stay: Payer: Medicaid Other

## 2017-10-17 VITALS — BP 132/83 | HR 83 | Temp 97.9°F | Resp 18

## 2017-10-17 DIAGNOSIS — C20 Malignant neoplasm of rectum: Secondary | ICD-10-CM

## 2017-10-17 DIAGNOSIS — Z5111 Encounter for antineoplastic chemotherapy: Secondary | ICD-10-CM | POA: Diagnosis not present

## 2017-10-17 DIAGNOSIS — C775 Secondary and unspecified malignant neoplasm of intrapelvic lymph nodes: Principal | ICD-10-CM

## 2017-10-17 MED ORDER — SODIUM CHLORIDE 0.9% FLUSH
10.0000 mL | INTRAVENOUS | Status: DC | PRN
  Administered 2017-10-17: 10 mL
  Filled 2017-10-17: qty 10

## 2017-10-17 MED ORDER — HEPARIN SOD (PORK) LOCK FLUSH 100 UNIT/ML IV SOLN
INTRAVENOUS | Status: AC
  Filled 2017-10-17: qty 5

## 2017-10-17 MED ORDER — HEPARIN SOD (PORK) LOCK FLUSH 100 UNIT/ML IV SOLN
500.0000 [IU] | Freq: Once | INTRAVENOUS | Status: AC | PRN
  Administered 2017-10-17: 500 [IU]

## 2017-10-22 ENCOUNTER — Telehealth: Payer: Self-pay | Admitting: *Deleted

## 2017-10-22 NOTE — Telephone Encounter (Signed)
Patient called the office stating that her Medicaid runs out the end of November.   The patient would like to see if she can move surgery up from 11-13-17 due to this.   If patient is not able to move surgery to November, she will have to cancel for now and reschedule once she gets other insurance.   Message sent to Dr. Jamal Collin to see if he is willing to do this.

## 2017-10-24 ENCOUNTER — Encounter: Payer: Self-pay | Admitting: Radiation Oncology

## 2017-10-24 ENCOUNTER — Other Ambulatory Visit: Payer: Self-pay

## 2017-10-24 ENCOUNTER — Ambulatory Visit
Admission: RE | Admit: 2017-10-24 | Discharge: 2017-10-24 | Disposition: A | Discharge status: Home / Self Care | Payer: Medicaid Other | Source: Ambulatory Visit | Attending: Radiation Oncology | Admitting: Radiation Oncology

## 2017-10-24 VITALS — BP 149/99 | HR 80 | Temp 96.9°F | Resp 20 | Wt 152.2 lb

## 2017-10-24 DIAGNOSIS — E041 Nontoxic single thyroid nodule: Secondary | ICD-10-CM | POA: Insufficient documentation

## 2017-10-24 DIAGNOSIS — Z9221 Personal history of antineoplastic chemotherapy: Secondary | ICD-10-CM | POA: Insufficient documentation

## 2017-10-24 DIAGNOSIS — C20 Malignant neoplasm of rectum: Secondary | ICD-10-CM

## 2017-10-24 DIAGNOSIS — Z923 Personal history of irradiation: Secondary | ICD-10-CM | POA: Diagnosis not present

## 2017-10-24 DIAGNOSIS — Z85048 Personal history of other malignant neoplasm of rectum, rectosigmoid junction, and anus: Secondary | ICD-10-CM | POA: Insufficient documentation

## 2017-10-24 NOTE — Progress Notes (Signed)
Radiation Oncology Follow up Note  Name: Rachel [name]   Date:   10/24/2017 MRN:  [id number] DOB: 10-Jun-1956    This 61 y.o. female presents to the clinic today for 10 month follow-up status post neoadjuvant concurrent chemoradiation for stage III rectal cancer.  REFERRING PROVIDER: Lynnell Jude, MD  HPI: Patient is a 61 year old female now seen out 10 months having completed concurrent chemoradiation therapy in a neoadjuvant setting for rectal cancer. She had an AP resection showing moderately differentiated adenocarcinoma. Margins were negative with no tumor seen in 5 lymph nodes examined. She completed consolidative FOLFOX chemotherapy. She seen today in routine follow-up is doing well. She's having surgery on her thyroid for a nodule in the near future. She specifically denies diarrhea dysuria or any other GI/GU complaints. She is an MRI of her pelvis ordered by medical oncology.  COMPLICATIONS OF TREATMENT: none  FOLLOW UP COMPLIANCE: keeps appointments   PHYSICAL EXAM:  BP (!) 149/99   Pulse 80   Temp (!) 96.9 F (36.1 C)   Resp 20   Wt 152 lb 3.6 oz (69 kg)   BMI 24.57 kg/m  Patient does have a palpable nodule and a right thyroid bed Well-developed well-nourished patient in NAD. HEENT reveals PERLA, EOMI, discs not visualized.  Oral cavity is clear. No oral mucosal lesions are identified. Neck is clear without evidence of cervical or supraclavicular adenopathy. Lungs are clear to A&P. Cardiac examination is essentially unremarkable with regular rate and rhythm without murmur rub or thrill. Abdomen is benign with no organomegaly or masses noted. Motor sensory and DTR levels are equal and symmetric in the upper and lower extremities. Cranial nerves II through XII are grossly intact. Proprioception is intact. No peripheral adenopathy or edema is identified. No motor or sensory levels are noted. Crude visual fields are within normal range.  RADIOLOGY RESULTS: No current films for  review  PLAN: At the present time patient continues to do well with no evidence of disease. At this time as per patient request I will turn over follow-up care to medical oncology. I would be happy to reevaluate the patient any time should further radiation oncology consultation be indicated. She continues to do well with no evidence of disease. As above she is scheduled for thyroid surgery.  I would like to take this opportunity to thank you for allowing me to participate in the care of your patient.Armstead Peaks., MD

## 2017-10-29 ENCOUNTER — Telehealth: Payer: Self-pay | Admitting: *Deleted

## 2017-10-29 NOTE — Telephone Encounter (Signed)
Message left for patient to call the office.   Surgery has been rescheduled to 11-05-17 at [facility] per patient's request to have surgery in November since insurance is running out.   The patient's pre-admission appointment has been moved to 10-31-17 at 8:30 am. She will need to come in and see Dr. Jamal Collin after she meets with Pre-admit (this appointment was also moved up).   We need to remind the patient that she will need to stop Plavix 5 days prior to surgery and start an 81 mg aspirin once Plavix is stopped.

## 2017-10-29 NOTE — Telephone Encounter (Signed)
Patient called the office back and was notified of surgery date for 11-05-17. She is aware of all instructions.   The patient was instructed to call the office if she has further questions.   Patient verbalizes understanding.

## 2017-10-31 ENCOUNTER — Encounter: Payer: Self-pay | Admitting: General Surgery

## 2017-10-31 ENCOUNTER — Other Ambulatory Visit: Payer: Self-pay

## 2017-10-31 ENCOUNTER — Other Ambulatory Visit
Admission: RE | Admit: 2017-10-31 | Discharge: 2017-10-31 | Disposition: A | Discharge status: Home / Self Care | Payer: Medicaid Other | Source: Ambulatory Visit | Attending: General Surgery | Admitting: General Surgery

## 2017-10-31 ENCOUNTER — Ambulatory Visit (INDEPENDENT_AMBULATORY_CARE_PROVIDER_SITE_OTHER): Payer: Medicaid Other | Admitting: General Surgery

## 2017-10-31 ENCOUNTER — Other Ambulatory Visit: Payer: Self-pay | Admitting: General Surgery

## 2017-10-31 ENCOUNTER — Encounter
Admission: RE | Admit: 2017-10-31 | Discharge: 2017-10-31 | Disposition: A | Discharge status: Home / Self Care | Payer: Medicaid Other | Source: Ambulatory Visit | Attending: General Surgery | Admitting: General Surgery

## 2017-10-31 VITALS — BP 124/72 | HR 70 | Resp 14 | Ht 66.0 in | Wt 152.0 lb

## 2017-10-31 DIAGNOSIS — R079 Chest pain, unspecified: Secondary | ICD-10-CM | POA: Insufficient documentation

## 2017-10-31 DIAGNOSIS — D34 Benign neoplasm of thyroid gland: Secondary | ICD-10-CM | POA: Diagnosis not present

## 2017-10-31 DIAGNOSIS — D696 Thrombocytopenia, unspecified: Secondary | ICD-10-CM

## 2017-10-31 DIAGNOSIS — Z01810 Encounter for preprocedural cardiovascular examination: Secondary | ICD-10-CM | POA: Diagnosis present

## 2017-10-31 DIAGNOSIS — Z01812 Encounter for preprocedural laboratory examination: Secondary | ICD-10-CM | POA: Diagnosis not present

## 2017-10-31 DIAGNOSIS — C20 Malignant neoplasm of rectum: Secondary | ICD-10-CM

## 2017-10-31 HISTORY — DX: Angina pectoris, unspecified: I20.9

## 2017-10-31 LAB — CBC WITH DIFFERENTIAL/PLATELET
BASOS ABS: 0 10*3/uL (ref 0–0.1)
BASOS PCT: 1 %
EOS ABS: 0.1 10*3/uL (ref 0–0.7)
Eosinophils Relative: 1 %
HEMATOCRIT: 39 % (ref 35.0–47.0)
Hemoglobin: 12.7 g/dL (ref 12.0–16.0)
Lymphocytes Relative: 13 %
Lymphs Abs: 0.6 10*3/uL — ABNORMAL LOW (ref 1.0–3.6)
MCH: 29.8 pg (ref 26.0–34.0)
MCHC: 32.5 g/dL (ref 32.0–36.0)
MCV: 91.8 fL (ref 80.0–100.0)
MONO ABS: 0.7 10*3/uL (ref 0.2–0.9)
Monocytes Relative: 14 %
NEUTROS ABS: 3.4 10*3/uL (ref 1.4–6.5)
NEUTROS PCT: 71 %
PLATELETS: 85 10*3/uL — AB (ref 150–440)
RBC: 4.25 MIL/uL (ref 3.80–5.20)
RDW: 18.1 % — AB (ref 11.5–14.5)
WBC: 4.8 10*3/uL (ref 3.6–11.0)

## 2017-10-31 MED ORDER — CHLORHEXIDINE GLUCONATE CLOTH 2 % EX PADS
6.0000 | MEDICATED_PAD | Freq: Once | CUTANEOUS | Status: DC
  Filled 2017-10-31: qty 6

## 2017-10-31 NOTE — Pre-Procedure Instructions (Signed)
Called Dr Angie Fava office with result of CBC/ PLT count.

## 2017-10-31 NOTE — Patient Instructions (Signed)
Your procedure is scheduled on: 11/05/17 Mon Report to Same Day Surgery 2nd floor medical mall [facility] Entrance-take elevator on left to 2nd floor.  Check in with surgery information desk.) To find out your arrival time please call [phone] between 1PM - 3PM on 11/02/17 Fri  Remember: Instructions that are not followed completely may result in serious medical risk, up to and including death, or upon the discretion of your surgeon and anesthesiologist your surgery may need to be rescheduled.    _x___ 1. Do not eat food after midnight the night before your procedure. You may drink clear liquids up to 2 hours before you are scheduled to arrive at the hospital for your procedure.  Do not drink clear liquids within 2 hours of your scheduled arrival to the hospital.  Clear liquids include  --Water or Apple juice without pulp  --Clear carbohydrate beverage such as ClearFast or Gatorade  --Black Coffee or Clear Tea (No milk, no creamers, do not add anything to                  the coffee or Tea Type 1 and type 2 diabetics should only drink water.  No gum chewing or hard candies.     __x__ 2. No Alcohol for 24 hours before or after surgery.   __x__3. No Smoking for 24 prior to surgery.   ____  4. Bring all medications with you on the day of surgery if instructed.    __x__ 5. Notify your doctor if there is any change in your medical condition     (cold, fever, infections).     Do not wear jewelry, make-up, hairpins, clips or nail polish.  Do not wear lotions, powders, or perfumes. You may wear deodorant.  Do not shave 48 hours prior to surgery. Men may shave face and neck.  Do not bring valuables to the hospital.    [facility] is not responsible for any belongings or valuables.               Contacts, dentures or bridgework may not be worn into surgery.  Leave your suitcase in the car. After surgery it may be brought to your room.  For patients admitted to the hospital, discharge  time is determined by your                       treatment team.   Patients discharged the day of surgery will not be allowed to drive home.  You will need someone to drive you home and stay with you the night of your procedure.    Please read over the following fact sheets that you were given:   [facility] Preparing for Surgery and or MRSA Information   _x___ Take anti-hypertensive listed below, cardiac, seizure, asthma,     anti-reflux and psychiatric medicines. These include:  1. amLODipine (NORVASC) 10 MG tablet  2.venlafaxine XR (EFFEXOR-XR) 37.5 MG 24 hr capsule  3.take pain medication if needed  4.  5.  6.  ____Fleets enema or Magnesium Citrate as directed.   _x___ Use CHG Soap or sage wipes as directed on instruction sheet   ____ Use inhalers on the day of surgery and bring to hospital day of surgery  ____ Stop Metformin and Janumet 2 days prior to surgery.    ____ Take 1/2 of usual insulin dose the night before surgery and none on the morning     surgery.   _x___ Follow  recommendations from Cardiologist, Pulmonologist or PCP regarding          stopping Aspirin, Coumadin, Plavix ,Eliquis, Effient, or Pradaxa, and Pletal.  Stopped plavix on 10/30/17 X____Stop Anti-inflammatories such as Advil, Aleve, Ibuprofen, Motrin, Naproxen, Naprosyn, Goodies powders or aspirin products. OK to take Tylenol and                          Celebrex.   _x___ Stop supplements until after surgery.  But may continue Vitamin D, Vitamin B,       and multivitamin.   ____ Bring C-Pap to the hospital.

## 2017-10-31 NOTE — Pre-Procedure Instructions (Signed)
Recheck plty count 85 per michele at dr sankar's and ok to proceed as planned

## 2017-10-31 NOTE — Progress Notes (Signed)
Patient ID: Rachel [name], female   DOB: 1956-09-04, 61 y.o.   MRN: [id number]  Chief Complaint  Patient presents with  . Pre-op Exam    HPI Rachel [name] is a 61 y.o. female here today for pre op thyroid surgery. Finished her chemo on 10/15/2017.  HPI. Pt has completed treatment for rectal CA, AP resection, chemo,radiation. She is known to have a large PET pos right thyroid nodule- biopsy showed Hurthle cell adenoma. She denies any pressure symptoms.  Past Medical History:  Diagnosis Date  . Anginal pain ([place])   . Arthritis   . Body mass index (BMI) of 24.0-24.9 in adult   . Cardiovascular disease   . Current every day smoker    a. ~35 years - > has cut down to < 3/4 ppd  . Headache   . History of UTI   . Hypertension   . Midsternal chest pain    a. 05/2014 Stress Echo: Ex time: 8:09, ECG w/o acute changes, EF nl with possible mid and apical anterior HK-->cath recommended.  . Port-A-Cath in place    RIGHT SIDE  . Rectal cancer ([place]) 2017  . Stroke ([place])    07/2016 tia no neurology followup now seen by pcp  . Syncope and collapse   . Thyroid nodule 2018   FINDINGS CONSISTENT WITH A HURTHLE CELL LESION AND/OR NEOPLASM (BETHESDA CATEGORY IV).    Past Surgical History:  Procedure Laterality Date  . ABDOMINAL HYSTERECTOMY    . ABDOMINAL PERINEAL BOWEL RESECTION N/A 03/16/2017   Procedure: ABDOMINAL PERINEAL RESECTION;  Surgeon: Christene Lye, MD;  Location: [facility] ORS;  Service: General;  Laterality: N/A;  . APPENDECTOMY  03/16/2017   Procedure: APPENDECTOMY;  Surgeon: Christene Lye, MD;  Location: [facility] ORS;  Service: General;;  . BACK SURGERY     l 3,4,5  . BIOPSY THYROID Right 11/16/2016   FINDINGS CONSISTENT WITH A HURTHLE CELL LESION AND/OR NEOPLASM (BETHESDA CATEGORY IV).  [name] CHOLECYSTECTOMY    . COLONOSCOPY WITH PROPOFOL N/A 08/15/2016   Procedure: COLONOSCOPY WITH PROPOFOL;  Surgeon: Lollie Sails, MD;  Location: [facility] ENDOSCOPY;  Service: Endoscopy;   Laterality: N/A;  . COLONOSCOPY WITH PROPOFOL N/A 11/08/2016   Procedure: COLONOSCOPY WITH PROPOFOL;  Surgeon: Christene Lye, MD;  Location: [facility] ENDOSCOPY;  Service: Endoscopy;  Laterality: N/A;  . COLOSTOMY N/A 11/23/2016   Procedure: SIGMOID COLOSTOMY;  Surgeon: Christene Lye, MD;  Location: [facility] ORS;  Service: General;  Laterality: N/A;  . ERCP    . LAPAROSCOPY N/A 11/23/2016   Procedure: LAPAROSCOPY DIAGNOSTIC;  Surgeon: Christene Lye, MD;  Location: [facility] ORS;  Service: General;  Laterality: N/A;  . PARTIAL HYSTERECTOMY    . PORTACATH PLACEMENT N/A 08/18/2016   Procedure: INSERTION PORT-A-CATH;  Surgeon: Christene Lye, MD;  Location: [facility] ORS;  Service: General;  Laterality: N/A;    Family History  Problem Relation Age of Onset  . Breast cancer Mother   . Hyperlipidemia Father   . Heart disease Paternal Grandmother   . Stroke Maternal Grandfather     Social History Social History   Tobacco Use  . Smoking status: Former Smoker    Packs/day: 0.25    Years: 35.00    Pack years: 8.75    Types: Cigarettes    Last attempt to quit: 08/11/2016    Years since quitting: 1.2  . Smokeless tobacco: Never Used  Substance Use Topics  . Alcohol use: No  . Drug use: No  Allergies  Allergen Reactions  . Prednisone Other (See Comments)    BLISTERS IN MOUTH AND ON TONGUE    Current Outpatient Medications  Medication Sig Dispense Refill  . amLODipine (NORVASC) 10 MG tablet Take 10 mg daily by mouth.    [name] aspirin EC 81 MG tablet Take 81 mg by mouth daily.    . clopidogrel (PLAVIX) 75 MG tablet Take 75 mg by mouth every evening.     . diphenoxylate-atropine (LOMOTIL) 2.5-0.025 MG tablet Take 1 tablet by mouth 4 (four) times daily as needed for diarrhea or loose stools. Take it along with immodium 30 tablet 0  . docusate sodium (COLACE) 100 MG capsule Take 100 mg daily by mouth.    . nitroGLYCERIN (NITROSTAT) 0.4 MG SL tablet Place 1 tablet (0.4 mg total)  under the tongue every 5 (five) minutes as needed for chest pain. (Patient taking differently: Place 0.4 mg every 5 (five) minutes as needed under the tongue for chest pain. Chest pain) 25 tablet 6  . oxyCODONE (OXY IR/ROXICODONE) 5 MG immediate release tablet Take 1-2 tablets (5-10 mg total) by mouth every 4 (four) hours as needed for moderate pain or severe pain. (Patient taking differently: Take 5 mg every 4 (four) hours as needed by mouth for moderate pain or severe pain. ) 30 tablet 0  . potassium chloride SA (K-DUR,KLOR-CON) 20 MEQ tablet Take 1 tablet (20 mEq total) by mouth 2 (two) times daily. (Patient taking differently: Take 20 mEq daily by mouth. ) 60 tablet 4  . traMADol (ULTRAM) 50 MG tablet Take 1 tablet (50 mg total) by mouth every 6 (six) hours as needed (for pain.). (Patient taking differently: Take 100 mg 3 (three) times daily by mouth. ) 30 tablet 0  . venlafaxine XR (EFFEXOR-XR) 37.5 MG 24 hr capsule TAKE ONE CAPSULE BY MOUTH EVERY MORNING WITH BREAKFAST 30 capsule 0   No current facility-administered medications for this visit.    Facility-Administered Medications Ordered in Other Visits  Medication Dose Route Frequency Provider Last Rate Last Dose  . Chlorhexidine Gluconate Cloth 2 % PADS 6 each  6 each Topical Once Christene Lye, MD        Review of Systems Review of Systems  Constitutional: Negative.   Respiratory: Negative.   Cardiovascular: Negative.     Blood pressure 124/72, pulse 70, resp. rate 14, height 5\' 6"  (1.676 m), weight 152 lb (68.9 kg).  Physical Exam Physical Exam  Constitutional: She is oriented to person, place, and time. She appears well-developed and well-nourished.  Eyes: Conjunctivae are normal. No scleral icterus.  Neck: Thyromegaly (2-3 cm firm mass in right thyroid lobe.) present.  Cardiovascular: Normal rate, regular rhythm and normal heart sounds.  Pulmonary/Chest: Effort normal and breath sounds normal.  Abdominal: Soft. Bowel  sounds are normal. There is no hepatomegaly. There is no tenderness.    Lymphadenopathy:    She has no cervical adenopathy.    She has no axillary adenopathy.       Right: No inguinal adenopathy present.       Left: No inguinal adenopathy present.  Neurological: She is alert and oriented to person, place, and time.  Skin: Skin is warm.    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  The platelet count was 77K 2 weeks ago     Plan   Patient is scheduled for surgery on 11/05/2017. Procedure has  already been explained to the patient and this is reiterated today. We will recheck platelet count today     HPI, Physical Exam, Assessment and Plan have been scribed under the direction and in the presence of Mckinley Jewel, MD  [name], CMA    I have completed the exam and reviewed the above documentation for accuracy and completeness.  I agree with the above.  Haematologist has been used and any errors in dictation or transcription are unintentional.  Leverne Amrhein G. Jamal Collin, M.D., F.A.C.S.    Junie Panning G 10/31/2017, 9:58 AM  Patient to have a CBC with diff and platelets at [facility] today.   Surgery planned for 11-05-17 at [facility].   Dominga Ferry, CMA

## 2017-10-31 NOTE — Pre-Procedure Instructions (Addendum)
AS INSTRUCTED BY DR Randa Lynn, SPOKE WITH MICHELE AT DR [facility]. PATIENT BEING SEEN TODAY AND ASKED THEIR OFFICE TO REPEAT PLT CLT AND ARRANGE IF PLTS NEEDED PREOP. [place] ARE WORKING ON IT TODAY

## 2017-10-31 NOTE — H&P (View-Only) (Signed)
Patient ID: Rachel [name], female   DOB: Jun 28, 1956, 61 y.o.   MRN: [id number]  Chief Complaint  Patient presents with  . Pre-op Exam    HPI Rachel PLACKE is a 61 y.o. female here today for pre op thyroid surgery. Finished her chemo on 10/15/2017.  HPI. Pt has completed treatment for rectal CA, AP resection, chemo,radiation. She is known to have a large PET pos right thyroid nodule- biopsy showed Hurthle cell adenoma. She denies any pressure symptoms.  Past Medical History:  Diagnosis Date  . Anginal pain ([place])   . Arthritis   . Body mass index (BMI) of 24.0-24.9 in adult   . Cardiovascular disease   . Current every day smoker    a. ~35 years - > has cut down to < 3/4 ppd  . Headache   . History of UTI   . Hypertension   . Midsternal chest pain    a. 05/2014 Stress Echo: Ex time: 8:09, ECG w/o acute changes, EF nl with possible mid and apical anterior HK-->cath recommended.  . Port-A-Cath in place    RIGHT SIDE  . Rectal cancer ([place]) 2017  . Stroke ([place])    07/2016 tia no neurology followup now seen by pcp  . Syncope and collapse   . Thyroid nodule 2018   FINDINGS CONSISTENT WITH A HURTHLE CELL LESION AND/OR NEOPLASM (BETHESDA CATEGORY IV).    Past Surgical History:  Procedure Laterality Date  . ABDOMINAL HYSTERECTOMY    . ABDOMINAL PERINEAL BOWEL RESECTION N/A 03/16/2017   Procedure: ABDOMINAL PERINEAL RESECTION;  Surgeon: Christene Lye, MD;  Location: [facility] ORS;  Service: General;  Laterality: N/A;  . APPENDECTOMY  03/16/2017   Procedure: APPENDECTOMY;  Surgeon: Christene Lye, MD;  Location: [facility] ORS;  Service: General;;  . BACK SURGERY     l 3,4,5  . BIOPSY THYROID Right 11/16/2016   FINDINGS CONSISTENT WITH A HURTHLE CELL LESION AND/OR NEOPLASM (BETHESDA CATEGORY IV).  [name] CHOLECYSTECTOMY    . COLONOSCOPY WITH PROPOFOL N/A 08/15/2016   Procedure: COLONOSCOPY WITH PROPOFOL;  Surgeon: Lollie Sails, MD;  Location[facility] ENDOSCOPY;  Service: Endoscopy;   Laterality: N/A;  . COLONOSCOPY WITH PROPOFOL N/A 11/08/2016   Procedure: COLONOSCOPY WITH PROPOFOL;  Surgeon: Christene Lye, MD;  Location: [facility] ENDOSCOPY;  Service: Endoscopy;  Laterality: N/A;  . COLOSTOMY N/A 11/23/2016   Procedure: SIGMOID COLOSTOMY;  Surgeon: Christene Lye, MD;  Location: [facility] ORS;  Service: General;  Laterality: N/A;  . ERCP    . LAPAROSCOPY N/A 11/23/2016   Procedure: LAPAROSCOPY DIAGNOSTIC;  Surgeon: Christene Lye, MD;  Location: [facility] ORS;  Service: General;  Laterality: N/A;  . PARTIAL HYSTERECTOMY    . PORTACATH PLACEMENT N/A 08/18/2016   Procedure: INSERTION PORT-A-CATH;  Surgeon: Christene Lye, MD;  Location: [facility] ORS;  Service: General;  Laterality: N/A;    Family History  Problem Relation Age of Onset  . Breast cancer Mother   . Hyperlipidemia Father   . Heart disease Paternal Grandmother   . Stroke Maternal Grandfather     Social History Social History   Tobacco Use  . Smoking status: Former Smoker    Packs/day: 0.25    Years: 35.00    Pack years: 8.75    Types: Cigarettes    Last attempt to quit: 08/11/2016    Years since quitting: 1.2  . Smokeless tobacco: Never Used  Substance Use Topics  . Alcohol use: No  . Drug use: No  Allergies  Allergen Reactions  . Prednisone Other (See Comments)    BLISTERS IN MOUTH AND ON TONGUE    Current Outpatient Medications  Medication Sig Dispense Refill  . amLODipine (NORVASC) 10 MG tablet Take 10 mg daily by mouth.    [name] aspirin EC 81 MG tablet Take 81 mg by mouth daily.    . clopidogrel (PLAVIX) 75 MG tablet Take 75 mg by mouth every evening.     . diphenoxylate-atropine (LOMOTIL) 2.5-0.025 MG tablet Take 1 tablet by mouth 4 (four) times daily as needed for diarrhea or loose stools. Take it along with immodium 30 tablet 0  . docusate sodium (COLACE) 100 MG capsule Take 100 mg daily by mouth.    . nitroGLYCERIN (NITROSTAT) 0.4 MG SL tablet Place 1 tablet (0.4 mg total)  under the tongue every 5 (five) minutes as needed for chest pain. (Patient taking differently: Place 0.4 mg every 5 (five) minutes as needed under the tongue for chest pain. Chest pain) 25 tablet 6  . oxyCODONE (OXY IR/ROXICODONE) 5 MG immediate release tablet Take 1-2 tablets (5-10 mg total) by mouth every 4 (four) hours as needed for moderate pain or severe pain. (Patient taking differently: Take 5 mg every 4 (four) hours as needed by mouth for moderate pain or severe pain. ) 30 tablet 0  . potassium chloride SA (K-DUR,KLOR-CON) 20 MEQ tablet Take 1 tablet (20 mEq total) by mouth 2 (two) times daily. (Patient taking differently: Take 20 mEq daily by mouth. ) 60 tablet 4  . traMADol (ULTRAM) 50 MG tablet Take 1 tablet (50 mg total) by mouth every 6 (six) hours as needed (for pain.). (Patient taking differently: Take 100 mg 3 (three) times daily by mouth. ) 30 tablet 0  . venlafaxine XR (EFFEXOR-XR) 37.5 MG 24 hr capsule TAKE ONE CAPSULE BY MOUTH EVERY MORNING WITH BREAKFAST 30 capsule 0   No current facility-administered medications for this visit.    Facility-Administered Medications Ordered in Other Visits  Medication Dose Route Frequency Provider Last Rate Last Dose  . Chlorhexidine Gluconate Cloth 2 % PADS 6 each  6 each Topical Once Christene Lye, MD        Review of Systems Review of Systems  Constitutional: Negative.   Respiratory: Negative.   Cardiovascular: Negative.     Blood pressure 124/72, pulse 70, resp. rate 14, height 5\' 6"  (1.676 m), weight 152 lb (68.9 kg).  Physical Exam Physical Exam  Constitutional: She is oriented to person, place, and time. She appears well-developed and well-nourished.  Eyes: Conjunctivae are normal. No scleral icterus.  Neck: Thyromegaly (2-3 cm firm mass in right thyroid lobe.) present.  Cardiovascular: Normal rate, regular rhythm and normal heart sounds.  Pulmonary/Chest: Effort normal and breath sounds normal.  Abdominal: Soft. Bowel  sounds are normal. There is no hepatomegaly. There is no tenderness.    Lymphadenopathy:    She has no cervical adenopathy.    She has no axillary adenopathy.       Right: No inguinal adenopathy present.       Left: No inguinal adenopathy present.  Neurological: She is alert and oriented to person, place, and time.  Skin: Skin is warm.    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  The platelet count was 77K 2 weeks ago     Plan   Patient is scheduled for surgery on 11/05/2017. Procedure has  already been explained to the patient and this is reiterated today. We will recheck platelet count today     HPI, Physical Exam, Assessment and Plan have been scribed under the direction and in the presence of Mckinley Jewel, MD  [name], CMA    I have completed the exam and reviewed the above documentation for accuracy and completeness.  I agree with the above.  Haematologist has been used and any errors in dictation or transcription are unintentional.  Ali Mclaurin G. Jamal Collin, M.D., F.A.C.S.    Junie Panning G 10/31/2017, 9:58 AM  Patient to have a CBC with diff and platelets at [facility] today.   Surgery planned for 11-05-17 at [facility].   Dominga Ferry, CMA

## 2017-11-04 MED ORDER — CEFAZOLIN SODIUM-DEXTROSE 2-4 GM/100ML-% IV SOLN
2.0000 g | INTRAVENOUS | Status: AC
  Administered 2017-11-05: 2 g via INTRAVENOUS

## 2017-11-05 ENCOUNTER — Other Ambulatory Visit: Payer: Self-pay

## 2017-11-05 ENCOUNTER — Ambulatory Visit
Admission: RE | Admit: 2017-11-05 | Discharge: 2017-11-05 | Disposition: A | Discharge status: Home / Self Care | Payer: Medicaid Other | Source: Ambulatory Visit | Attending: General Surgery | Admitting: General Surgery

## 2017-11-05 ENCOUNTER — Ambulatory Visit: Payer: Medicaid Other | Admitting: Registered Nurse

## 2017-11-05 ENCOUNTER — Encounter
Admission: RE | Disposition: A | Discharge status: Home / Self Care | Payer: Self-pay | Source: Ambulatory Visit | Attending: General Surgery

## 2017-11-05 ENCOUNTER — Encounter: Payer: Self-pay | Admitting: *Deleted

## 2017-11-05 DIAGNOSIS — Z7902 Long term (current) use of antithrombotics/antiplatelets: Secondary | ICD-10-CM | POA: Diagnosis not present

## 2017-11-05 DIAGNOSIS — I2510 Atherosclerotic heart disease of native coronary artery without angina pectoris: Secondary | ICD-10-CM | POA: Diagnosis not present

## 2017-11-05 DIAGNOSIS — Z9221 Personal history of antineoplastic chemotherapy: Secondary | ICD-10-CM | POA: Insufficient documentation

## 2017-11-05 DIAGNOSIS — D6959 Other secondary thrombocytopenia: Secondary | ICD-10-CM | POA: Insufficient documentation

## 2017-11-05 DIAGNOSIS — I10 Essential (primary) hypertension: Secondary | ICD-10-CM | POA: Insufficient documentation

## 2017-11-05 DIAGNOSIS — Z87891 Personal history of nicotine dependence: Secondary | ICD-10-CM | POA: Insufficient documentation

## 2017-11-05 DIAGNOSIS — Z8673 Personal history of transient ischemic attack (TIA), and cerebral infarction without residual deficits: Secondary | ICD-10-CM | POA: Insufficient documentation

## 2017-11-05 DIAGNOSIS — Z85048 Personal history of other malignant neoplasm of rectum, rectosigmoid junction, and anus: Secondary | ICD-10-CM | POA: Diagnosis not present

## 2017-11-05 DIAGNOSIS — Z933 Colostomy status: Secondary | ICD-10-CM | POA: Insufficient documentation

## 2017-11-05 DIAGNOSIS — D34 Benign neoplasm of thyroid gland: Secondary | ICD-10-CM | POA: Diagnosis not present

## 2017-11-05 DIAGNOSIS — Z7982 Long term (current) use of aspirin: Secondary | ICD-10-CM | POA: Insufficient documentation

## 2017-11-05 DIAGNOSIS — Z923 Personal history of irradiation: Secondary | ICD-10-CM | POA: Diagnosis not present

## 2017-11-05 DIAGNOSIS — I739 Peripheral vascular disease, unspecified: Secondary | ICD-10-CM | POA: Insufficient documentation

## 2017-11-05 DIAGNOSIS — Z79899 Other long term (current) drug therapy: Secondary | ICD-10-CM | POA: Diagnosis not present

## 2017-11-05 HISTORY — PX: THYROID LOBECTOMY: SHX420

## 2017-11-05 SURGERY — LOBECTOMY, THYROID
Anesthesia: General | Laterality: Right | Wound class: Clean

## 2017-11-05 MED ORDER — LIDOCAINE HCL (CARDIAC) 20 MG/ML IV SOLN
INTRAVENOUS | Status: DC | PRN
  Administered 2017-11-05: 50 mg via INTRAVENOUS

## 2017-11-05 MED ORDER — MIDAZOLAM HCL 2 MG/2ML IJ SOLN
INTRAMUSCULAR | Status: DC | PRN
  Administered 2017-11-05: 2 mg via INTRAVENOUS

## 2017-11-05 MED ORDER — FENTANYL CITRATE (PF) 100 MCG/2ML IJ SOLN: 25.0000 ug | INTRAMUSCULAR | Status: DC | PRN

## 2017-11-05 MED ORDER — ACETAMINOPHEN 10 MG/ML IV SOLN
INTRAVENOUS | Status: AC
  Filled 2017-11-05: qty 100

## 2017-11-05 MED ORDER — MIDAZOLAM HCL 2 MG/2ML IJ SOLN
INTRAMUSCULAR | Status: AC
  Filled 2017-11-05: qty 2

## 2017-11-05 MED ORDER — ACETAMINOPHEN 10 MG/ML IV SOLN
INTRAVENOUS | Status: DC | PRN
  Administered 2017-11-05: 1000 mg via INTRAVENOUS

## 2017-11-05 MED ORDER — BUPIVACAINE HCL (PF) 0.5 % IJ SOLN
INTRAMUSCULAR | Status: AC
  Filled 2017-11-05: qty 30

## 2017-11-05 MED ORDER — PROPOFOL 10 MG/ML IV BOLUS
INTRAVENOUS | Status: AC
  Filled 2017-11-05: qty 20

## 2017-11-05 MED ORDER — SUCCINYLCHOLINE CHLORIDE 20 MG/ML IJ SOLN
INTRAMUSCULAR | Status: DC | PRN
  Administered 2017-11-05: 100 mg via INTRAVENOUS

## 2017-11-05 MED ORDER — FAMOTIDINE 20 MG PO TABS
20.0000 mg | ORAL_TABLET | Freq: Once | ORAL | Status: AC
  Administered 2017-11-05: 20 mg via ORAL

## 2017-11-05 MED ORDER — PROPOFOL 10 MG/ML IV BOLUS
INTRAVENOUS | Status: DC | PRN
  Administered 2017-11-05: 160 mg via INTRAVENOUS

## 2017-11-05 MED ORDER — EPHEDRINE SULFATE 50 MG/ML IJ SOLN
INTRAMUSCULAR | Status: DC | PRN
  Administered 2017-11-05 (×2): 10 mg via INTRAVENOUS

## 2017-11-05 MED ORDER — BUPIVACAINE-EPINEPHRINE 0.5% -1:200000 IJ SOLN
INTRAMUSCULAR | Status: DC | PRN
  Administered 2017-11-05: 5 mL
  Administered 2017-11-05: 20 mL

## 2017-11-05 MED ORDER — FENTANYL CITRATE (PF) 100 MCG/2ML IJ SOLN
INTRAMUSCULAR | Status: AC
  Filled 2017-11-05: qty 2

## 2017-11-05 MED ORDER — PROMETHAZINE HCL 25 MG/ML IJ SOLN: 6.2500 mg | INTRAMUSCULAR | Status: DC | PRN

## 2017-11-05 MED ORDER — LACTATED RINGERS IV SOLN
INTRAVENOUS | Status: DC
  Administered 2017-11-05 (×2): via INTRAVENOUS

## 2017-11-05 MED ORDER — BUPIVACAINE-EPINEPHRINE (PF) 0.5% -1:200000 IJ SOLN
INTRAMUSCULAR | Status: AC
  Filled 2017-11-05: qty 30

## 2017-11-05 MED ORDER — ONDANSETRON HCL 4 MG/2ML IJ SOLN
INTRAMUSCULAR | Status: DC | PRN
  Administered 2017-11-05: 4 mg via INTRAVENOUS

## 2017-11-05 SURGICAL SUPPLY — 42 items
ADH SKN CLS APL DERMABOND .7 (GAUZE/BANDAGES/DRESSINGS) ×2
BLADE SURG 15 STRL SS SAFETY (BLADE) ×4 IMPLANT
CHLORAPREP W/TINT 26ML (MISCELLANEOUS) ×4 IMPLANT
DERMABOND ADVANCED (GAUZE/BANDAGES/DRESSINGS) ×2
DERMABOND ADVANCED .7 DNX12 (GAUZE/BANDAGES/DRESSINGS) ×2 IMPLANT
DRAPE LAPAROTOMY TRNSV 106X77 (MISCELLANEOUS) ×4 IMPLANT
DRAPE MAG INST 16X20 L/F (DRAPES) ×4 IMPLANT
DRAPE SHEET LG 3/4 BI-LAMINATE (DRAPES) ×4 IMPLANT
ELECT REM PT RETURN 9FT ADLT (ELECTROSURGICAL) ×4
ELECTRODE REM PT RTRN 9FT ADLT (ELECTROSURGICAL) ×2 IMPLANT
GAUZE SPONGE 4X4 12PLY STRL (GAUZE/BANDAGES/DRESSINGS) ×1 IMPLANT
GLOVE BIO SURGEON STRL SZ7 (GLOVE) ×7 IMPLANT
GLOVE BIO SURGEON STRL SZ7.5 (GLOVE) ×4 IMPLANT
GLOVE BIOGEL PI IND STRL 6.5 (GLOVE) ×1 IMPLANT
GLOVE BIOGEL PI INDICATOR 6.5 (GLOVE) ×2
GLOVE INDICATOR 8.0 STRL GRN (GLOVE) ×7 IMPLANT
GOWN STRL REUS W/ TWL LRG LVL3 (GOWN DISPOSABLE) ×7 IMPLANT
GOWN STRL REUS W/TWL LRG LVL3 (GOWN DISPOSABLE) ×16
LABEL OR SOLS (LABEL) ×4 IMPLANT
NEEDLE HYPO 22GX1.5 SAFETY (NEEDLE) ×3 IMPLANT
NS IRRIG 500ML POUR BTL (IV SOLUTION) ×4 IMPLANT
PACK BASIN MINOR ARMC (MISCELLANEOUS) ×4 IMPLANT
SHEARS HARMONIC 9CM CVD (BLADE) ×3 IMPLANT
SPONGE KITTNER 5P (MISCELLANEOUS) ×4 IMPLANT
SUCTION FRAZIER HANDLE 10FR (MISCELLANEOUS) ×2
SUCTION TUBE FRAZIER 10FR DISP (MISCELLANEOUS) ×2 IMPLANT
SUT ETHILON 3-0 FS-10 30 BLK (SUTURE) ×4
SUT SILK 2 0 (SUTURE) ×4
SUT SILK 2-0 18XBRD TIE 12 (SUTURE) ×2 IMPLANT
SUT SILK 3 0 (SUTURE) ×4
SUT SILK 3-0 (SUTURE) ×8 IMPLANT
SUT SILK 3-0 18XBRD TIE 12 (SUTURE) ×3 IMPLANT
SUT SILK 4 0 (SUTURE)
SUT SILK 4-0 18XBRD TIE 12 (SUTURE) ×1 IMPLANT
SUT VIC AB 3-0 SH 27 (SUTURE) ×4
SUT VIC AB 3-0 SH 27X BRD (SUTURE) ×3 IMPLANT
SUT VIC AB 4-0 FS2 27 (SUTURE) ×4 IMPLANT
SUT VICRYL+ 3-0 144IN (SUTURE) ×7 IMPLANT
SUTURE EHLN 3-0 FS-10 30 BLK (SUTURE) ×2 IMPLANT
SYR BULB IRRIG 60ML STRL (SYRINGE) ×4 IMPLANT
SYRINGE 10CC LL (SYRINGE) ×3 IMPLANT
SYSTEM CHEST DRAIN TLS 7FR (DRAIN) IMPLANT

## 2017-11-05 NOTE — Op Note (Signed)
Preop diagnosis: Hurthle cell adenoma right lower thyroid  Post op diagnosis: Same  Operation: Right thyroid lobectomy  Surgeon: Mckinley Jewel  Assistant: Hervey Ard  Anesthesia: General  Complications: None  EBL: Minimal  Drains: None  Description: Patient was put to sleep with an endotracheal tube in place with the [facility] and neck slightly extended using a shoulder pad.  This patient had a prominent nodule in the upper half of the right lobe of the thyroid which by prior FNA was known to be a Hurthle cell adenoma -Beyhesda  category 4. The neck area was prepped and draped in a sterile field and timeout performed.  A collar incision was made in the suprasternal sternal area approximately a fingerbreadth and a half above the notch.  Skin incision was made after infiltration of approximately 20-25 mL of 0.5% Marcaine mixed with 1% Xylocaine with epi.  Incision was deepened through the subcutaneous tissue and the platysmal layer.  The midline fascia overlying the trachea was then incised longitudinally strap muscle was retracted laterally on the right.  The right lobe was easily visible containing the large nodule from 2-2-1/2 cm size.  With the use of Harmonic decides this was circumferentially freed.  Both the superior pole vessels and the inferior pole vessels were taken down with the harmonic device.  The inferior parathyroid was identified.  The recurrent laryngeal nerve was also identified.  The right lobe was then freed down and towards the midline where the isthmus was taken down with the use of the harmonic device.  The right lobe was then sent in formalin to pathology with the lower pole tagged with a stitch.  Hemostasis was intact.  The midline fascia was closed with 3-0 Vicryl.  Platysma was also closed with 3-0 Vicryl.  Skin closed with subcuticular 4-0 Vicryl covered with Dermabond.  Procedure was well tolerated and no immediate problems were encountered.  Patient was subsequently  returned to recovery room in stable condition

## 2017-11-05 NOTE — Anesthesia Postprocedure Evaluation (Signed)
Anesthesia Post Note  Patient: Rachel [name]  Procedure(s) Performed: THYROID LOBECTOMY (Right )  Patient location during evaluation: PACU Anesthesia Type: General Level of consciousness: awake and alert Pain management: pain level controlled Vital Signs Assessment: post-procedure vital signs reviewed and stable Respiratory status: spontaneous breathing, nonlabored ventilation, respiratory function stable and patient connected to nasal cannula oxygen Cardiovascular status: blood pressure returned to baseline and stable Postop Assessment: no apparent nausea or vomiting Anesthetic complications: no     Last Vitals:  Vitals:   11/05/17 0948 11/05/17 1029  BP: (!) 146/83 (!) 145/85  Pulse: 71 64  Resp:    Temp: 37 C 37.1 C  SpO2: 96% 94%    Last Pain:  Vitals:   11/05/17 1029  TempSrc: Temporal  PainSc: 3                  Martha Clan

## 2017-11-05 NOTE — OR Nursing (Signed)
Pt given ice pack for home use

## 2017-11-05 NOTE — Anesthesia Procedure Notes (Signed)
Procedure Name: Intubation Date/Time: 11/05/2017 7:38 AM Performed by: Lesle Reek, CRNA Pre-anesthesia Checklist: Patient identified, Timeout performed, Patient being monitored, Suction available and Emergency Drugs available Patient Re-evaluated:Patient Re-evaluated prior to induction Oxygen Delivery Method: Circle system utilized Preoxygenation: Pre-oxygenation with 100% oxygen Induction Type: IV induction Laryngoscope Size: Mac and 3 Grade View: Grade I Tube type: Oral Tube size: 7.0 mm Number of attempts: 1 Airway Equipment and Method: Stylet Placement Confirmation: ETT inserted through vocal cords under direct vision,  breath sounds checked- equal and bilateral,  CO2 detector and positive ETCO2 Secured at: 22 cm Tube secured with: Tape

## 2017-11-05 NOTE — Interval H&P Note (Signed)
History and Physical Interval Note:  11/05/2017 7:01 AM  Rachel [name]  has presented today for surgery, with the diagnosis of right thyroid adenoma  The various methods of treatment have been discussed with the patient and family. After consideration of risks, benefits and other options for treatment, the patient has consented to  Procedure(s): THYROID LOBECTOMY (Right) THYROIDECTOMY (N/A) as a surgical intervention .  The patient's history has been reviewed, patient examined, no change in status, stable for surgery.  I have reviewed the patient's chart and labs.  Questions were answered to the patient's satisfaction.     [name],[name]

## 2017-11-05 NOTE — Anesthesia Post-op Follow-up Note (Signed)
Anesthesia QCDR form completed.        

## 2017-11-05 NOTE — Anesthesia Preprocedure Evaluation (Signed)
Anesthesia Evaluation  Patient identified by MRN, date of birth, ID band Patient awake    Reviewed: Allergy & Precautions, H&P , NPO status , Patient's Chart, lab work & pertinent test results, reviewed documented beta blocker date and time   Airway Mallampati: II  TM Distance: >3 FB Neck ROM: full    Dental  (+) Edentulous Upper, Edentulous Lower, Upper Dentures, Lower Dentures, Dental Advidsory Given   Pulmonary neg shortness of breath, neg sleep apnea, neg COPD, Current Smoker,           Cardiovascular hypertension, (-) angina+ CAD and + Peripheral Vascular Disease  (-) Past MI, (-) Cardiac Stents and (-) CABG negative cardio ROS  (-) dysrhythmias (-) Valvular Problems/Murmurs     Neuro/Psych neg Seizures TIAnegative psych ROS   GI/Hepatic negative GI ROS, Neg liver ROS,   Endo/Other  negative endocrine ROS  Renal/GU negative Renal ROS  negative genitourinary   Musculoskeletal   Abdominal   Peds  Hematology negative hematology ROS (+)   Anesthesia Other Findings Past Medical History: No date: Anginal pain ([place]) No date: Arthritis No date: Body mass index (BMI) of 24.0-24.9 in adult No date: Cardiovascular disease No date: Current every day smoker     Comment:  a. ~35 years - > has cut down to < 3/4 ppd No date: Headache No date: History of UTI No date: Hypertension No date: Midsternal chest pain     Comment:  a. 05/2014 Stress Echo: Ex time: 8:09, ECG w/o acute               changes, EF nl with possible mid and apical anterior               HK-->cath recommended. No date: Port-A-Cath in place     Comment:  RIGHT SIDE 2017: Rectal cancer ([place]) No date: Stroke [facility]     Comment:  07/2016 tia no neurology followup now seen by pcp No date: Syncope and collapse 2018: Thyroid nodule     Comment:  FINDINGS CONSISTENT WITH A HURTHLE CELL LESION AND/OR               NEOPLASM (BETHESDA CATEGORY IV).   Reproductive/Obstetrics negative OB ROS                             Anesthesia Physical Anesthesia Plan  ASA: II  Anesthesia Plan: General   Post-op Pain Management:    Induction: Intravenous  PONV Risk Score and Plan: 2 and Ondansetron and Dexamethasone  Airway Management Planned: Oral ETT  Additional Equipment:   Intra-op Plan:   Post-operative Plan: Extubation in OR  Informed Consent: I have reviewed the patients History and Physical, chart, labs and discussed the procedure including the risks, benefits and alternatives for the proposed anesthesia with the patient or authorized representative who has indicated his/her understanding and acceptance.   Dental Advisory Given  Plan Discussed with: Anesthesiologist, CRNA and Surgeon  Anesthesia Plan Comments:         Anesthesia Quick Evaluation

## 2017-11-05 NOTE — Discharge Instructions (Signed)

## 2017-11-05 NOTE — Transfer of Care (Signed)
Immediate Anesthesia Transfer of Care Note  Patient: Rachel [name]  Procedure(s) Performed: THYROID LOBECTOMY (Right )  Patient Location: PACU  Anesthesia Type:General  Level of Consciousness: awake and alert   Airway & Oxygen Therapy: Patient Spontanous Breathing and Patient connected to face mask oxygen  Post-op Assessment: Report given to RN  Post vital signs: Reviewed and stable  Last Vitals:  Vitals:   11/05/17 0624  BP: (!) 135/102  Pulse: 86  Resp: 16  Temp: 36.6 C  SpO2: 95%    Last Pain:  Vitals:   11/05/17 0624  TempSrc: Tympanic         Complications: No apparent anesthesia complications

## 2017-11-06 ENCOUNTER — Inpatient Hospital Stay: Admission: RE | Admit: 2017-11-06 | Payer: Medicaid Other | Source: Ambulatory Visit

## 2017-11-06 ENCOUNTER — Ambulatory Visit: Payer: Medicaid Other | Admitting: General Surgery

## 2017-11-09 ENCOUNTER — Inpatient Hospital Stay (HOSPITAL_BASED_OUTPATIENT_CLINIC_OR_DEPARTMENT_OTHER): Payer: Medicaid Other | Admitting: Internal Medicine

## 2017-11-09 ENCOUNTER — Inpatient Hospital Stay: Payer: Medicaid Other

## 2017-11-09 VITALS — BP 135/86 | HR 77 | Temp 98.1°F | Resp 16 | Wt 149.4 lb

## 2017-11-09 DIAGNOSIS — Z5111 Encounter for antineoplastic chemotherapy: Secondary | ICD-10-CM | POA: Diagnosis not present

## 2017-11-09 DIAGNOSIS — Z923 Personal history of irradiation: Secondary | ICD-10-CM | POA: Diagnosis not present

## 2017-11-09 DIAGNOSIS — C73 Malignant neoplasm of thyroid gland: Secondary | ICD-10-CM

## 2017-11-09 DIAGNOSIS — R112 Nausea with vomiting, unspecified: Secondary | ICD-10-CM | POA: Diagnosis not present

## 2017-11-09 DIAGNOSIS — C775 Secondary and unspecified malignant neoplasm of intrapelvic lymph nodes: Secondary | ICD-10-CM

## 2017-11-09 DIAGNOSIS — R970 Elevated carcinoembryonic antigen [CEA]: Secondary | ICD-10-CM

## 2017-11-09 DIAGNOSIS — C20 Malignant neoplasm of rectum: Secondary | ICD-10-CM

## 2017-11-09 DIAGNOSIS — D696 Thrombocytopenia, unspecified: Secondary | ICD-10-CM

## 2017-11-09 DIAGNOSIS — Z79899 Other long term (current) drug therapy: Secondary | ICD-10-CM

## 2017-11-09 DIAGNOSIS — E876 Hypokalemia: Secondary | ICD-10-CM | POA: Diagnosis not present

## 2017-11-09 LAB — CBC WITH DIFFERENTIAL/PLATELET
BASOS ABS: 0 10*3/uL (ref 0–0.1)
BASOS PCT: 1 %
EOS ABS: 0.2 10*3/uL (ref 0–0.7)
EOS PCT: 4 %
HCT: 39 % (ref 35.0–47.0)
HEMOGLOBIN: 12.9 g/dL (ref 12.0–16.0)
LYMPHS ABS: 0.6 10*3/uL — AB (ref 1.0–3.6)
Lymphocytes Relative: 12 %
MCH: 30.3 pg (ref 26.0–34.0)
MCHC: 33 g/dL (ref 32.0–36.0)
MCV: 91.7 fL (ref 80.0–100.0)
Monocytes Absolute: 0.6 10*3/uL (ref 0.2–0.9)
Monocytes Relative: 11 %
NEUTROS PCT: 72 %
Neutro Abs: 3.9 10*3/uL (ref 1.4–6.5)
PLATELETS: 129 10*3/uL — AB (ref 150–440)
RBC: 4.25 MIL/uL (ref 3.80–5.20)
RDW: 17.8 % — ABNORMAL HIGH (ref 11.5–14.5)
WBC: 5.3 10*3/uL (ref 3.6–11.0)

## 2017-11-09 LAB — SURGICAL PATHOLOGY

## 2017-11-09 LAB — COMPREHENSIVE METABOLIC PANEL
ALBUMIN: 3.5 g/dL (ref 3.5–5.0)
ALK PHOS: 127 U/L — AB (ref 38–126)
ALT: 16 U/L (ref 14–54)
AST: 25 U/L (ref 15–41)
Anion gap: 10 (ref 5–15)
BUN: 14 mg/dL (ref 6–20)
CALCIUM: 9.2 mg/dL (ref 8.9–10.3)
CHLORIDE: 98 mmol/L — AB (ref 101–111)
CO2: 30 mmol/L (ref 22–32)
CREATININE: 0.8 mg/dL (ref 0.44–1.00)
GFR calc non Af Amer: >60 mL/min (ref >60)
GLUCOSE: 123 mg/dL — AB (ref 65–99)
Potassium: 3.4 mmol/L — ABNORMAL LOW (ref 3.5–5.1)
SODIUM: 138 mmol/L (ref 135–145)
Total Bilirubin: 0.5 mg/dL (ref 0.3–1.2)
Total Protein: 7.6 g/dL (ref 6.5–8.1)

## 2017-11-09 NOTE — Progress Notes (Signed)
[place] OFFICE PROGRESS NOTE  [name] Care Team: Lynnell Jude, MD as PCP - General (Family Medicine) Christene Lye, MD (General Surgery)  Cancer Staging No matching staging information was found for the [name].   Oncology History   # SEP 2017- RECTAL CA mod diff adeno; STAGE III [no EUS; positive ~18m LN; Dr.sankar] CEA-7; Sep 21st START 5FU-RT [finished NOV 2017]; April 7th APR- 2018- [id number] [5LN]; positive for LVI/PNI [poor response to chemo]; Margins-Negative.  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 [name] is currently status post 10 cycles of adjuvant chemotherapy approximately 1 month ago.  Interim [name] underwent partial thyroidectomy with Dr. SJamal Collinapproximately 3 days ago.  [name] is recovering well from surgery.  She unfortunately continues to have tingling and numbness of the  extremities however this is not getting any worse.    Continues to have back pain-not getting any worse or better.  REVIEW OF SYSTEMS:  A complete 10 point review of system is done which is negative except mentioned above/history of present illness.   PAST MEDICAL HISTORY :  Past Medical History:  Diagnosis Date  . Anginal pain ([place]   . Arthritis   . Body mass index (BMI) of 24.0-24.9 in adult   . Cardiovascular disease   . Current every day smoker    a. ~35 years - > has cut down to < 3/4 ppd  . Headache   . History of UTI   . Hypertension   . Midsternal chest pain    a. 05/2014 Stress Echo:  Ex time: 8:09, ECG w/o acute changes, EF nl with possible mid and apical anterior HK-->cath recommended.  . Port-A-Cath in place    RIGHT SIDE  . Rectal cancer ([place] 2017  . Stroke ([place]    07/2016 tia no neurology followup now seen by pcp  . Syncope and collapse   . Thyroid nodule 2018   FINDINGS CONSISTENT WITH A HURTHLE CELL LESION AND/OR NEOPLASM (BETHESDA CATEGORY IV).    PAST SURGICAL HISTORY :   Past Surgical History:  Procedure Laterality Date  . ABDOMINAL HYSTERECTOMY    . ABDOMINAL PERINEAL BOWEL RESECTION N/A 03/16/2017   Procedure: ABDOMINAL PERINEAL RESECTION;  Surgeon: SChristene Lye MD;  Location: [facility] ORS;  Service: General;  Laterality: N/A;  . APPENDECTOMY  03/16/2017   Procedure: APPENDECTOMY;  Surgeon: SChristene Lye MD;  Location: [facility] ORS;  Service: General;;  . BACK SURGERY     l 3,4,5  . BIOPSY THYROID Right 11/16/2016   FINDINGS CONSISTENT WITH A HURTHLE CELL LESION AND/OR NEOPLASM (BETHESDA CATEGORY IV).  .Marland KitchenCHOLECYSTECTOMY    . COLONOSCOPY WITH PROPOFOL N/A 08/15/2016   Procedure: COLONOSCOPY WITH PROPOFOL;  Surgeon: MLollie Sails MD;  [facility];  Service: Endoscopy;  Laterality: N/A;  . COLONOSCOPY WITH PROPOFOL N/A 11/08/2016   Procedure: COLONOSCOPY WITH PROPOFOL;  Surgeon: SChristene Lye MD;  Location: [facility] ENDOSCOPY;  Service: Endoscopy;  Laterality: N/A;  . COLOSTOMY N/A 11/23/2016   Procedure: SIGMOID COLOSTOMY;  Surgeon: SDaryel Gerald  Robinette Haines, MD;  Location: [facility] ORS;  Service: General;  Laterality: N/A;  . ERCP    . LAPAROSCOPY N/A 11/23/2016   Procedure: LAPAROSCOPY DIAGNOSTIC;  Surgeon: Christene Lye, MD;  Location: [facility] ORS;  Service: General;  Laterality: N/A;  . PARTIAL HYSTERECTOMY    . PORTACATH PLACEMENT N/A 08/18/2016   Procedure: INSERTION PORT-A-CATH;  Surgeon: Christene Lye, MD;  Location: [facility] ORS;  Service: General;  Laterality: N/A;  . THYROID LOBECTOMY Right 11/05/2017    Procedure: THYROID LOBECTOMY;  Surgeon: Christene Lye, MD;  Location: [facility] ORS;  Service: General;  Laterality: Right;    FAMILY HISTORY :  Mom- gall bladder ca; breast cancer- 43s; mat grand ma- stomach cancer- 68s; sblings- 2 no cancers; 2 daughters- one daughter? Cervical  Family History  Problem Relation Age of Onset  . Breast cancer Mother   . Hyperlipidemia Father   . Heart disease Paternal Grandmother   . Stroke Maternal Grandfather     SOCIAL HISTORY:   Social History   Tobacco Use  . Smoking status: Former Smoker    Packs/day: 0.25    Years: 35.00    Pack years: 8.75    Types: Cigarettes    Last attempt to quit: 08/11/2016    Years since quitting: 1.2  . Smokeless tobacco: Never Used  Substance Use Topics  . Alcohol use: No  . Drug use: No    ALLERGIES:  is allergic to prednisone.  MEDICATIONS:  Current Outpatient Medications  Medication Sig Dispense Refill  . amLODipine (NORVASC) 10 MG tablet Take 10 mg daily by mouth.    [name] aspirin EC 81 MG tablet Take 81 mg by mouth daily.    . clopidogrel (PLAVIX) 75 MG tablet Take 75 mg by mouth every evening.     . diphenoxylate-atropine (LOMOTIL) 2.5-0.025 MG tablet Take 1 tablet by mouth 4 (four) times daily as needed for diarrhea or loose stools. Take it along with immodium 30 tablet 0  . docusate sodium (COLACE) 100 MG capsule Take 100 mg daily by mouth.    . oxyCODONE (OXY IR/ROXICODONE) 5 MG immediate release tablet Take 1-2 tablets (5-10 mg total) by mouth every 4 (four) hours as needed for moderate pain or severe pain. ([name] taking differently: Take 5 mg every 4 (four) hours as needed by mouth for moderate pain or severe pain. ) 30 tablet 0  . potassium chloride SA (K-DUR,KLOR-CON) 20 MEQ tablet Take 1 tablet (20 mEq total) by mouth 2 (two) times daily. ([name] taking differently: Take 20 mEq daily by mouth. ) 60 tablet 4  . traMADol (ULTRAM) 50 MG tablet Take 1 tablet (50 mg total) by mouth every 6 (six)  hours as needed (for pain.). ([name] taking differently: Take 100 mg 3 (three) times daily by mouth. ) 30 tablet 0  . venlafaxine XR (EFFEXOR-XR) 37.5 MG 24 hr capsule TAKE ONE CAPSULE BY MOUTH EVERY MORNING WITH BREAKFAST 30 capsule 0  . doxycycline (VIBRA-TABS) 100 MG tablet Take 1 tablet (100 mg total) by mouth 2 (two) times daily. 14 tablet 0  . nitroGLYCERIN (NITROSTAT) 0.4 MG SL tablet Place 1 tablet (0.4 mg total) under the tongue every 5 (five) minutes as needed for chest pain. 25 tablet 6   No current facility-administered medications for this visit.     PHYSICAL EXAMINATION: ECOG PERFORMANCE STATUS: 1 - Symptomatic but completely ambulatory  BP 135/86 (BP Location: Left Arm, [name] Position: Sitting)   Pulse 77   Temp 98.1 F (  36.7 C)   Resp 16   Wt 149 lb 6.4 oz (67.8 kg)   BMI 24.11 kg/m   Filed Weights   11/09/17 1151  Weight: 149 lb 6.4 oz (67.8 kg)    GENERAL: Well-nourished well-developed; Alert, no distress and comfortable.accompanied by sister. EYES: no pallor or icterus OROPHARYNX: no thrush or ulceration; good dentition  NECK: supple, no masses felt; incision well healing noted. LYMPH:  no palpable lymphadenopathy in the cervical, axillary or inguinal regions LUNGS: clear to auscultation and  No wheeze or crackles HEART/CVS: regular rate & rhythm and no murmurs; No lower extremity edema ABDOMEN:abdomen soft, non-tender and normal bowel sounds Musculoskeletal:no cyanosis of digits and no clubbing  PSYCH: alert & oriented x 3; flat affect NEURO: no focal motor/sensory deficits SKIN:  no rashes or significant lesions  LABORATORY DATA:  I have reviewed the data as listed    Component Value Date/Time   NA 138 11/09/2017 1101   NA 140 03/08/2017 1223   NA 144 07/25/2014 0444   K 3.4 (L) 11/09/2017 1101   K 3.7 07/25/2014 0444   CL 98 (L) 11/09/2017 1101   CL 107 07/25/2014 0444   CO2 30 11/09/2017 1101   CO2 30 07/25/2014 0444   GLUCOSE 123 (H)  11/09/2017 1101   GLUCOSE 87 07/25/2014 0444   BUN 14 11/09/2017 1101   BUN 13 03/08/2017 1223   BUN 8 07/25/2014 0444   CREATININE 0.80 11/09/2017 1101   CREATININE 0.69 07/25/2014 0444   CALCIUM 9.2 11/09/2017 1101   CALCIUM 8.4 (L) 07/25/2014 0444   PROT 7.6 11/09/2017 1101   PROT 6.8 03/08/2017 1223   PROT 6.5 07/25/2014 0444   ALBUMIN 3.5 11/09/2017 1101   ALBUMIN 4.2 03/08/2017 1223   ALBUMIN 2.6 (L) 07/25/2014 0444   AST 25 11/09/2017 1101   AST 96 (H) 07/25/2014 0444   ALT 16 11/09/2017 1101   ALT 123 (H) 07/25/2014 0444   ALKPHOS 127 (H) 11/09/2017 1101   ALKPHOS 189 (H) 07/25/2014 0444   BILITOT 0.5 11/09/2017 1101   BILITOT 0.2 03/08/2017 1223   BILITOT 2.0 (H) 07/25/2014 0444   GFRNONAA >60 11/09/2017 1101   GFRNONAA >60 07/25/2014 0444   GFRAA >60 11/09/2017 1101   GFRAA >60 07/25/2014 0444    No results found for: SPEP, UPEP  Lab Results  Component Value Date   WBC 5.3 11/09/2017   NEUTROABS 3.9 11/09/2017   HGB 12.9 11/09/2017   HCT 39.0 11/09/2017   MCV 91.7 11/09/2017   PLT 129 (L) 11/09/2017      Chemistry      Component Value Date/Time   NA 138 11/09/2017 1101   NA 140 03/08/2017 1223   NA 144 07/25/2014 0444   K 3.4 (L) 11/09/2017 1101   K 3.7 07/25/2014 0444   CL 98 (L) 11/09/2017 1101   CL 107 07/25/2014 0444   CO2 30 11/09/2017 1101   CO2 30 07/25/2014 0444   BUN 14 11/09/2017 1101   BUN 13 03/08/2017 1223   BUN 8 07/25/2014 0444   CREATININE 0.80 11/09/2017 1101   CREATININE 0.69 07/25/2014 0444      Component Value Date/Time   CALCIUM 9.2 11/09/2017 1101   CALCIUM 8.4 (L) 07/25/2014 0444   ALKPHOS 127 (H) 11/09/2017 1101   ALKPHOS 189 (H) 07/25/2014 0444   AST 25 11/09/2017 1101   AST 96 (H) 07/25/2014 0444   ALT 16 11/09/2017 1101   ALT 123 (H) 07/25/2014 0444  BILITOT 0.5 11/09/2017 1101   BILITOT 0.2 03/08/2017 1223   BILITOT 2.0 (H) 07/25/2014 0444     IMPRESSION: No scintigraphic evidence of osseous metastatic  disease.  Abnormal uptake at the LEFT lateral aspect of the lumbar spine at L3-L4 corresponding to degenerative disc disease changes and spur formation on prior radiographs.   Electronically Signed   By: Lavonia Dana M.D.   On: 09/28/2017 15:19  RADIOGRAPHIC STUDIES: I have personally reviewed the radiological images as listed and agreed with the findings in the report. No results found.   ASSESSMENT & PLAN:  Rectal cancer metastasized to intrapelvic lymph node ([place]) # Rectal  Cancer- stage III- s/p neoadjuvant chemoradiation with 5-FU continuous infusion with radiation; s/p APR April 7th 2018; currently status post adjuvant FOLFOX [finished November 2018].  #Clinically no evidence of recurrence; monitor CEA.  Plan to get a CT scan in approximately 3 months.  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  # Hypokalemia- today 3.5; Stable. Continue oral potassium supplementation  # Thrombocytopenia-140s-improving.   # "Thyroid cancer"- s/p sugery- adenoma; no evidence of any malignancy.  #left hip pain with radiculopathy-  MRI lumbar spine- ? Sacral insufficiency-sacral MRI recommended. /bone scan ? Arthritis.  #  CT scan in begenning of march 2019; port flush in 2 months/a nd next viist/ labs cbc/cmp/cea; CT scan prior.   Addendum:  CEA elevated at 5.3; recommend repeating CEA in 1 month; and if still rising recommend scan sooner.   Orders Placed This Encounter  Procedures  . CT Abdomen Pelvis W Contrast    Standing Status:   Future    Standing Expiration Date:   11/09/2018    Scheduling Instructions:     1-2 days prior to MD visit.    Order Specific Question:   If indicated for the ordered procedure, I authorize the administration of contrast media per Radiology protocol    Answer:   Yes    Order Specific Question:   Preferred imaging location?    Answer:   [facility]    Order Specific Question:   Radiology Contrast Protocol - do NOT remove file  path    Answer:   [email or web address]    Order Specific Question:   Reason for Exam additional comments    Answer:   rectal cancer       Cammie Sickle, MD 11/12/2017 5:22 PM

## 2017-11-09 NOTE — Assessment & Plan Note (Addendum)
#   Rectal  Cancer- stage III- s/p neoadjuvant chemoradiation with 5-FU continuous infusion with radiation; s/p APR April 7th 2018; currently status post adjuvant FOLFOX [finished November 2018].  #Clinically no evidence of recurrence; monitor CEA.  Plan to get a CT scan in approximately 3 months.  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  # Hypokalemia- today 3.5; Stable. Continue oral potassium supplementation  # Thrombocytopenia-140s-improving.   # "Thyroid cancer"- s/p sugery- adenoma; no evidence of any malignancy.  #left hip pain with radiculopathy-  MRI lumbar spine- ? Sacral insufficiency-sacral MRI recommended. /bone scan ? Arthritis.  #  CT scan in begenning of march 2019; port flush in 2 months/a nd next viist/ labs cbc/cmp/cea; CT scan prior.   Addendum:  CEA elevated at 5.3; recommend repeating CEA in 1 month; and if still rising recommend scan sooner.

## 2017-11-10 LAB — CEA: CEA1: 5.3 ng/mL — AB (ref 0.0–4.7)

## 2017-11-12 ENCOUNTER — Telehealth: Payer: Self-pay | Admitting: Internal Medicine

## 2017-11-12 ENCOUNTER — Ambulatory Visit (INDEPENDENT_AMBULATORY_CARE_PROVIDER_SITE_OTHER): Payer: Self-pay | Admitting: General Surgery

## 2017-11-12 ENCOUNTER — Encounter: Payer: Self-pay | Admitting: General Surgery

## 2017-11-12 VITALS — BP 118/72 | HR 74 | Resp 12 | Ht 68.0 in | Wt 150.0 lb

## 2017-11-12 DIAGNOSIS — E041 Nontoxic single thyroid nodule: Secondary | ICD-10-CM

## 2017-11-12 MED ORDER — DOXYCYCLINE HYCLATE 100 MG PO TABS: 100.0000 mg | ORAL_TABLET | Freq: Two times a day (BID) | ORAL | 0 refills | Status: DC

## 2017-11-12 NOTE — Progress Notes (Signed)
Patient ID: Rachel [name], female   DOB: 14-Apr-1956, 61 y.o.   MRN: [id number]  Chief Complaint  Patient presents with  . Routine Post Op    HPI Rachel [name] is a 61 y.o. female here today for her post op thyroid lobectomy done on 11/05/2017. She reports drainage from the area.  HPI  Past Medical History:  Diagnosis Date  . Anginal pain ([place])   . Arthritis   . Body mass index (BMI) of 24.0-24.9 in adult   . Cardiovascular disease   . Current every day smoker    a. ~35 years - > has cut down to < 3/4 ppd  . Headache   . History of UTI   . Hypertension   . Midsternal chest pain    a. 05/2014 Stress Echo: Ex time: 8:09, ECG w/o acute changes, EF nl with possible mid and apical anterior HK-->cath recommended.  . Port-A-Cath in place    RIGHT SIDE  . Rectal cancer ([place]) 2017  . Stroke ([place])    07/2016 tia no neurology followup now seen by pcp  . Syncope and collapse   . Thyroid nodule 2018   FINDINGS CONSISTENT WITH A HURTHLE CELL LESION AND/OR NEOPLASM (BETHESDA CATEGORY IV).    Past Surgical History:  Procedure Laterality Date  . ABDOMINAL HYSTERECTOMY    . ABDOMINAL PERINEAL BOWEL RESECTION N/A 03/16/2017   Procedure: ABDOMINAL PERINEAL RESECTION;  Surgeon: Christene Lye, MD;  Location: [facility] ORS;  Service: General;  Laterality: N/A;  . APPENDECTOMY  03/16/2017   Procedure: APPENDECTOMY;  Surgeon: Christene Lye, MD;  Location: [facility] ORS;  Service: General;;  . BACK SURGERY     l 3,4,5  . BIOPSY THYROID Right 11/16/2016   FINDINGS CONSISTENT WITH A HURTHLE CELL LESION AND/OR NEOPLASM (BETHESDA CATEGORY IV).  [name] CHOLECYSTECTOMY    . COLONOSCOPY WITH PROPOFOL N/A 08/15/2016   Procedure: COLONOSCOPY WITH PROPOFOL;  Surgeon: Lollie Sails, MD;  Location: [facility] ENDOSCOPY;  Service: Endoscopy;  Laterality: N/A;  . COLONOSCOPY WITH PROPOFOL N/A 11/08/2016   Procedure: COLONOSCOPY WITH PROPOFOL;  Surgeon: Christene Lye, MD;  Location: [facility] ENDOSCOPY;  Service:  Endoscopy;  Laterality: N/A;  . COLOSTOMY N/A 11/23/2016   Procedure: SIGMOID COLOSTOMY;  Surgeon: Christene Lye, MD;  Location: [facility] ORS;  Service: General;  Laterality: N/A;  . ERCP    . LAPAROSCOPY N/A 11/23/2016   Procedure: LAPAROSCOPY DIAGNOSTIC;  Surgeon: Christene Lye, MD;  Location: [facility] ORS;  Service: General;  Laterality: N/A;  . PARTIAL HYSTERECTOMY    . PORTACATH PLACEMENT N/A 08/18/2016   Procedure: INSERTION PORT-A-CATH;  Surgeon: Christene Lye, MD;  Location: [facility] ORS;  Service: General;  Laterality: N/A;  . THYROID LOBECTOMY Right 11/05/2017   Procedure: THYROID LOBECTOMY;  Surgeon: Christene Lye, MD;  Location: [facility] ORS;  Service: General;  Laterality: Right;    Family History  Problem Relation Age of Onset  . Breast cancer Mother   . Hyperlipidemia Father   . Heart disease Paternal Grandmother   . Stroke Maternal Grandfather     Social History Social History   Tobacco Use  . Smoking status: Former Smoker    Packs/day: 0.25    Years: 35.00    Pack years: 8.75    Types: Cigarettes    Last attempt to quit: 08/11/2016    Years since quitting: 1.2  . Smokeless tobacco: Never Used  Substance Use Topics  . Alcohol use: No  . Drug use: No  Allergies  Allergen Reactions  . Prednisone Other (See Comments)    BLISTERS IN MOUTH AND ON TONGUE    Current Outpatient Medications  Medication Sig Dispense Refill  . amLODipine (NORVASC) 10 MG tablet Take 10 mg daily by mouth.    [name] aspirin EC 81 MG tablet Take 81 mg by mouth daily.    . clopidogrel (PLAVIX) 75 MG tablet Take 75 mg by mouth every evening.     . diphenoxylate-atropine (LOMOTIL) 2.5-0.025 MG tablet Take 1 tablet by mouth 4 (four) times daily as needed for diarrhea or loose stools. Take it along with immodium 30 tablet 0  . docusate sodium (COLACE) 100 MG capsule Take 100 mg daily by mouth.    . nitroGLYCERIN (NITROSTAT) 0.4 MG SL tablet Place 1 tablet (0.4 mg total) under  the tongue every 5 (five) minutes as needed for chest pain. 25 tablet 6  . oxyCODONE (OXY IR/ROXICODONE) 5 MG immediate release tablet Take 1-2 tablets (5-10 mg total) by mouth every 4 (four) hours as needed for moderate pain or severe pain. (Patient taking differently: Take 5 mg every 4 (four) hours as needed by mouth for moderate pain or severe pain. ) 30 tablet 0  . potassium chloride SA (K-DUR,KLOR-CON) 20 MEQ tablet Take 1 tablet (20 mEq total) by mouth 2 (two) times daily. (Patient taking differently: Take 20 mEq daily by mouth. ) 60 tablet 4  . traMADol (ULTRAM) 50 MG tablet Take 1 tablet (50 mg total) by mouth every 6 (six) hours as needed (for pain.). (Patient taking differently: Take 100 mg 3 (three) times daily by mouth. ) 30 tablet 0  . venlafaxine XR (EFFEXOR-XR) 37.5 MG 24 hr capsule TAKE ONE CAPSULE BY MOUTH EVERY MORNING WITH BREAKFAST 30 capsule 0  . doxycycline (VIBRA-TABS) 100 MG tablet Take 1 tablet (100 mg total) by mouth 2 (two) times daily. 14 tablet 0   No current facility-administered medications for this visit.     Review of Systems Review of Systems  Constitutional: Negative.   Respiratory: Negative.   Cardiovascular: Negative.     Blood pressure 118/72, pulse 74, resp. rate 12, height 5\' 8"  (1.727 m), weight 150 lb (68 kg).  Physical Exam Physical Exam  Constitutional: She is oriented to person, place, and time. She appears well-developed and well-nourished.  Neck:  Swelling and redness noted to incision site.   Neurological: She is alert and oriented to person, place, and time.  Skin: Skin is warm and dry.  Psychiatric: She has a normal mood and affect.    Data Reviewed  Pathology revealed adenomatoid nodule benign in nature. Assessment    Postop right thyroid lobectomy.  Minimal redness and swelling along the incision.  No deep seeded signs of abscess or hematoma    Plan    1 week course of doxycycline reassess in a couple of weeks,    HPI,  Physical Exam, Assessment and Plan have been scribed under the direction and in the presence of Mckinley Jewel, MD  [name], CMA  I have completed the exam and reviewed the above documentation for accuracy and completeness.  I agree with the above.  Haematologist has been used and any errors in dictation or transcription are unintentional.  [name]. Jamal Collin, M.D., F.A.C.S.  Junie Panning G 11/18/2017, 12:15 PM

## 2017-11-12 NOTE — Patient Instructions (Signed)
Finish antibiotics. Follow up here in 2 weeks.

## 2017-11-12 NOTE — Telephone Encounter (Signed)
H/B-please inform pt that CEA slightly elevated at 5.3; recommend repeating CEA in 1 month; and if still rising recommend scan sooner.  # Please order CEA in appx 1 month.

## 2017-11-13 ENCOUNTER — Other Ambulatory Visit: Payer: Self-pay | Admitting: Internal Medicine

## 2017-11-13 DIAGNOSIS — C775 Secondary and unspecified malignant neoplasm of intrapelvic lymph nodes: Principal | ICD-10-CM

## 2017-11-13 DIAGNOSIS — C20 Malignant neoplasm of rectum: Secondary | ICD-10-CM

## 2017-11-13 NOTE — Telephone Encounter (Signed)
Lab order has been entered and patient has been notified. Patient verbalized understanding. Colette, would you mind calling patient to set up a lab appt in 1 month? Thank you!

## 2017-11-26 ENCOUNTER — Encounter: Payer: Self-pay | Admitting: General Surgery

## 2017-11-26 ENCOUNTER — Ambulatory Visit (INDEPENDENT_AMBULATORY_CARE_PROVIDER_SITE_OTHER): Payer: Self-pay | Admitting: General Surgery

## 2017-11-26 VITALS — BP 132/74 | HR 72 | Resp 13 | Ht 67.0 in | Wt 148.0 lb

## 2017-11-26 DIAGNOSIS — C20 Malignant neoplasm of rectum: Secondary | ICD-10-CM

## 2017-11-26 DIAGNOSIS — E041 Nontoxic single thyroid nodule: Secondary | ICD-10-CM

## 2017-11-26 NOTE — Progress Notes (Signed)
Patient ID: Rachel [name], female   DOB: 12/30/1955, 61 y.o.   MRN: [id number]  Chief Complaint  Patient presents with  . Follow-up    HPI Rachel [name] is a 61 y.o. female here today for her post op thyroid lobectomy  done on 11/05/2017. Patient states she is doing well.  HPI  Past Medical History:  Diagnosis Date  . Anginal pain ([place])   . Arthritis   . Body mass index (BMI) of 24.0-24.9 in adult   . Cardiovascular disease   . Current every day smoker    a. ~35 years - > has cut down to < 3/4 ppd  . Headache   . History of UTI   . Hypertension   . Midsternal chest pain    a. 05/2014 Stress Echo: Ex time: 8:09, ECG w/o acute changes, EF nl with possible mid and apical anterior HK-->cath recommended.  . Port-A-Cath in place    RIGHT SIDE  . Rectal cancer ([place]) 2017  . Stroke ([place])    07/2016 tia no neurology followup now seen by pcp  . Syncope and collapse   . Thyroid nodule 2018   FINDINGS CONSISTENT WITH A HURTHLE CELL LESION AND/OR NEOPLASM (BETHESDA CATEGORY IV).    Past Surgical History:  Procedure Laterality Date  . ABDOMINAL HYSTERECTOMY    . ABDOMINAL PERINEAL BOWEL RESECTION N/A 03/16/2017   Procedure: ABDOMINAL PERINEAL RESECTION;  Surgeon: Christene Lye, MD;  Location: [facility] ORS;  Service: General;  Laterality: N/A;  . APPENDECTOMY  03/16/2017   Procedure: APPENDECTOMY;  Surgeon: Christene Lye, MD;  Location: [facility] ORS;  Service: General;;  . BACK SURGERY     l 3,4,5  . BIOPSY THYROID Right 11/16/2016   FINDINGS CONSISTENT WITH A HURTHLE CELL LESION AND/OR NEOPLASM (BETHESDA CATEGORY IV).  [name] CHOLECYSTECTOMY    . COLONOSCOPY WITH PROPOFOL N/A 08/15/2016   Procedure: COLONOSCOPY WITH PROPOFOL;  Surgeon: Lollie Sails, MD;  Location: [facility] ENDOSCOPY;  Service: Endoscopy;  Laterality: N/A;  . COLONOSCOPY WITH PROPOFOL N/A 11/08/2016   Procedure: COLONOSCOPY WITH PROPOFOL;  Surgeon: Christene Lye, MD;  Location: [facility] ENDOSCOPY;  Service:  Endoscopy;  Laterality: N/A;  . COLOSTOMY N/A 11/23/2016   Procedure: SIGMOID COLOSTOMY;  Surgeon: Christene Lye, MD;  Location: [facility] ORS;  Service: General;  Laterality: N/A;  . ERCP    . LAPAROSCOPY N/A 11/23/2016   Procedure: LAPAROSCOPY DIAGNOSTIC;  Surgeon: Christene Lye, MD;  Location: [facility]S;  Service: General;  Laterality: N/A;  . PARTIAL HYSTERECTOMY    . PORTACATH PLACEMENT N/A 08/18/2016   Procedure: INSERTION PORT-A-CATH;  Surgeon: Christene Lye, MD;  Location: [facility] ORS;  Service: General;  Laterality: N/A;  . THYROID LOBECTOMY Right 11/05/2017   Procedure: THYROID LOBECTOMY;  Surgeon: Christene Lye, MD;  Location: [facility] ORS;  Service: General;  Laterality: Right;    Family History  Problem Relation Age of Onset  . Breast cancer Mother   . Hyperlipidemia Father   . Heart disease Paternal Grandmother   . Stroke Maternal Grandfather     Social History Social History   Tobacco Use  . Smoking status: Former Smoker    Packs/day: 0.25    Years: 35.00    Pack years: 8.75    Types: Cigarettes    Last attempt to quit: 08/11/2016    Years since quitting: 1.2  . Smokeless tobacco: Never Used  Substance Use Topics  . Alcohol use: No  . Drug use: No  Allergies  Allergen Reactions  . Prednisone Other (See Comments)    BLISTERS IN MOUTH AND ON TONGUE    Current Outpatient Medications  Medication Sig Dispense Refill  . amLODipine (NORVASC) 10 MG tablet Take 10 mg daily by mouth.    [name] aspirin EC 81 MG tablet Take 81 mg by mouth daily.    . clopidogrel (PLAVIX) 75 MG tablet Take 75 mg by mouth every evening.     . diphenoxylate-atropine (LOMOTIL) 2.5-0.025 MG tablet Take 1 tablet by mouth 4 (four) times daily as needed for diarrhea or loose stools. Take it along with immodium 30 tablet 0  . docusate sodium (COLACE) 100 MG capsule Take 100 mg daily by mouth.    . doxycycline (VIBRA-TABS) 100 MG tablet Take 1 tablet (100 mg total) by mouth 2  (two) times daily. 14 tablet 0  . nitroGLYCERIN (NITROSTAT) 0.4 MG SL tablet Place 1 tablet (0.4 mg total) under the tongue every 5 (five) minutes as needed for chest pain. 25 tablet 6  . oxyCODONE (OXY IR/ROXICODONE) 5 MG immediate release tablet Take 1-2 tablets (5-10 mg total) by mouth every 4 (four) hours as needed for moderate pain or severe pain. (Patient taking differently: Take 5 mg every 4 (four) hours as needed by mouth for moderate pain or severe pain. ) 30 tablet 0  . potassium chloride SA (K-DUR,KLOR-CON) 20 MEQ tablet Take 1 tablet (20 mEq total) by mouth 2 (two) times daily. (Patient taking differently: Take 20 mEq daily by mouth. ) 60 tablet 4  . traMADol (ULTRAM) 50 MG tablet Take 1 tablet (50 mg total) by mouth every 6 (six) hours as needed (for pain.). (Patient taking differently: Take 100 mg 3 (three) times daily by mouth. ) 30 tablet 0  . venlafaxine XR (EFFEXOR-XR) 37.5 MG 24 hr capsule TAKE ONE CAPSULE BY MOUTH EVERY MORNING WITH BREAKFAST 30 capsule 0   No current facility-administered medications for this visit.     Review of Systems Review of Systems  Constitutional: Negative.   Respiratory: Negative.   Cardiovascular: Negative.     Blood pressure 132/74, pulse 72, resp. rate 13, height 5\' 7"  (1.702 m), weight 148 lb (67.1 kg).  Physical Exam Physical Exam  Constitutional: She is oriented to person, place, and time. She appears well-developed.  Neck:    Neurological: She is alert and oriented to person, place, and time.  Skin: Skin is warm and dry.    Data Reviewed Notes reviewed   Assessment    Post op right thyroid lobectomy. Benign adenoma CA rectum- APR, chemo radiation. Currently stable.    Plan       Patient to return in four months for follow up. The patient is aware to call back for any questions or concerns.   HPI, Physical Exam, Assessment and Plan have been scribed under the direction and in the presence of Mckinley Jewel, MD  [name], CMA  I have completed the exam and reviewed the above documentation for accuracy and completeness.  I agree with the above.  Haematologist has been used and any errors in dictation or transcription are unintentional.  Taylee Gunnells G. Jamal Collin, M.D., F.A.C.S.  Junie Panning G 11/26/2017, 2:17 PM

## 2017-11-26 NOTE — Patient Instructions (Signed)
Patient to return in four months for cancer of rectum. The patient is aware to call back for any questions or concerns.

## 2017-11-28 ENCOUNTER — Inpatient Hospital Stay: Payer: Self-pay

## 2017-12-14 ENCOUNTER — Inpatient Hospital Stay: Payer: Self-pay | Attending: Internal Medicine

## 2018-01-09 ENCOUNTER — Inpatient Hospital Stay: Payer: Medicaid Other | Attending: Internal Medicine

## 2018-02-20 ENCOUNTER — Inpatient Hospital Stay: Payer: Self-pay

## 2018-03-07 ENCOUNTER — Inpatient Hospital Stay: Payer: BLUE CROSS/BLUE SHIELD | Attending: Internal Medicine

## 2018-03-07 ENCOUNTER — Encounter: Payer: Self-pay | Admitting: Internal Medicine

## 2018-03-07 ENCOUNTER — Inpatient Hospital Stay (HOSPITAL_BASED_OUTPATIENT_CLINIC_OR_DEPARTMENT_OTHER): Payer: BLUE CROSS/BLUE SHIELD | Admitting: Internal Medicine

## 2018-03-07 VITALS — BP 126/85 | HR 70 | Temp 98.1°F | Resp 16 | Wt 160.6 lb

## 2018-03-07 DIAGNOSIS — Z87891 Personal history of nicotine dependence: Secondary | ICD-10-CM | POA: Diagnosis not present

## 2018-03-07 DIAGNOSIS — C20 Malignant neoplasm of rectum: Secondary | ICD-10-CM | POA: Insufficient documentation

## 2018-03-07 DIAGNOSIS — Z87440 Personal history of urinary (tract) infections: Secondary | ICD-10-CM | POA: Diagnosis not present

## 2018-03-07 DIAGNOSIS — C775 Secondary and unspecified malignant neoplasm of intrapelvic lymph nodes: Secondary | ICD-10-CM

## 2018-03-07 DIAGNOSIS — I2510 Atherosclerotic heart disease of native coronary artery without angina pectoris: Secondary | ICD-10-CM | POA: Insufficient documentation

## 2018-03-07 DIAGNOSIS — Z7982 Long term (current) use of aspirin: Secondary | ICD-10-CM | POA: Insufficient documentation

## 2018-03-07 DIAGNOSIS — Z9221 Personal history of antineoplastic chemotherapy: Secondary | ICD-10-CM | POA: Diagnosis not present

## 2018-03-07 DIAGNOSIS — Z923 Personal history of irradiation: Secondary | ICD-10-CM | POA: Diagnosis not present

## 2018-03-07 DIAGNOSIS — Z7902 Long term (current) use of antithrombotics/antiplatelets: Secondary | ICD-10-CM | POA: Diagnosis not present

## 2018-03-07 DIAGNOSIS — I10 Essential (primary) hypertension: Secondary | ICD-10-CM | POA: Insufficient documentation

## 2018-03-07 DIAGNOSIS — Z79899 Other long term (current) drug therapy: Secondary | ICD-10-CM | POA: Insufficient documentation

## 2018-03-07 DIAGNOSIS — Z95828 Presence of other vascular implants and grafts: Secondary | ICD-10-CM

## 2018-03-07 DIAGNOSIS — Z8673 Personal history of transient ischemic attack (TIA), and cerebral infarction without residual deficits: Secondary | ICD-10-CM | POA: Diagnosis not present

## 2018-03-07 DIAGNOSIS — M5116 Intervertebral disc disorders with radiculopathy, lumbar region: Secondary | ICD-10-CM | POA: Insufficient documentation

## 2018-03-07 LAB — CBC WITH DIFFERENTIAL/PLATELET
BASOS ABS: 0.1 10*3/uL (ref 0–0.1)
Basophils Relative: 1 %
EOS ABS: 0.1 10*3/uL (ref 0–0.7)
Eosinophils Relative: 2 %
HCT: 38 % (ref 35.0–47.0)
HEMOGLOBIN: 13.2 g/dL (ref 12.0–16.0)
LYMPHS ABS: 0.9 10*3/uL — AB (ref 1.0–3.6)
LYMPHS PCT: 16 %
MCH: 31.1 pg (ref 26.0–34.0)
MCHC: 34.6 g/dL (ref 32.0–36.0)
MCV: 90 fL (ref 80.0–100.0)
Monocytes Absolute: 0.5 10*3/uL (ref 0.2–0.9)
Monocytes Relative: 9 %
NEUTROS PCT: 72 %
Neutro Abs: 4.3 10*3/uL (ref 1.4–6.5)
Platelets: 160 10*3/uL (ref 150–440)
RBC: 4.22 MIL/uL (ref 3.80–5.20)
RDW: 13.9 % (ref 11.5–14.5)
WBC: 6 10*3/uL (ref 3.6–11.0)

## 2018-03-07 LAB — COMPREHENSIVE METABOLIC PANEL
ALT: 15 U/L (ref 14–54)
ANION GAP: 10 (ref 5–15)
AST: 26 U/L (ref 15–41)
Albumin: 3.8 g/dL (ref 3.5–5.0)
Alkaline Phosphatase: 137 U/L — ABNORMAL HIGH (ref 38–126)
BUN: 13 mg/dL (ref 6–20)
CHLORIDE: 101 mmol/L (ref 101–111)
CO2: 26 mmol/L (ref 22–32)
Calcium: 8.8 mg/dL — ABNORMAL LOW (ref 8.9–10.3)
Creatinine, Ser: 0.69 mg/dL (ref 0.44–1.00)
Glucose, Bld: 93 mg/dL (ref 65–99)
POTASSIUM: 3.4 mmol/L — AB (ref 3.5–5.1)
Sodium: 137 mmol/L (ref 135–145)
TOTAL PROTEIN: 7.6 g/dL (ref 6.5–8.1)
Total Bilirubin: 0.6 mg/dL (ref 0.3–1.2)

## 2018-03-07 MED ORDER — SODIUM CHLORIDE 0.9% FLUSH
10.0000 mL | INTRAVENOUS | Status: AC | PRN
  Administered 2018-03-07: 10 mL
  Filled 2018-03-07: qty 10

## 2018-03-07 MED ORDER — HEPARIN SOD (PORK) LOCK FLUSH 100 UNIT/ML IV SOLN
500.0000 [IU] | INTRAVENOUS | Status: AC | PRN
  Administered 2018-03-07: 500 [IU]

## 2018-03-07 NOTE — Assessment & Plan Note (Addendum)
#   Rectal  Cancer- stage III- s/p neoadjuvant chemoradiation with 5-FU continuous infusion with radiation; s/p APR April 7th 2018; currently status post adjuvant FOLFOX [finished November 2018].  #Clinically no evidence of recurrence; monitor CEA.  Plan to get a CT scan in approximately 3 months.  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 Improved.   # port flush every 2 months; for now.   # left hip pain with radiculopathy-  MRI lumbar spine- ? Sacral insufficiency; overall improved.   # will wait on CEA from today; if elevated recommend CT scan asap; if not follow up in 2 months/CT scan/CEA/labs/port flush.

## 2018-03-07 NOTE — Progress Notes (Signed)
[facility] OFFICE PROGRESS NOTE  Patient Care Team: Lynnell Jude, MD as PCP - General (Family Medicine) Christene Lye, MD (General Surgery)  Cancer Staging No matching staging information was found for the patient.   Oncology History   # SEP 2017- RECTAL CA mod diff adeno; STAGE III [no EUS; positive ~4m LN; Dr.sankar] CEA-7; Sep 21st START 5FU-RT [finished NOV 2017]; April 7th APR- 2018- [id number] [5LN]; positive for LVI/PNI [poor response to chemo]; Margins-Negative.  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  female patient about history of stage III colon cancer status post adjuvant chemotherapy is here for follow-up   Patient denies abdominal pain.  Denies any nausea vomiting.  Denies any chest pain or shortness of breath or cough.  Tingling and numbness in the feet is improved; not completely resolved.  Her back pain has mostly resolved.  REVIEW OF SYSTEMS:  A complete 10 point review of system is done which is negative except mentioned above/history of present illness.   PAST MEDICAL HISTORY :  Past Medical History:  Diagnosis Date  . [place]   . Arthritis   . Body mass index (BMI) of 24.0-24.9 in adult   . Cardiovascular disease   . Current every day smoker    a. ~35 years - > has cut down to < 3/4 ppd  . Headache   . History of UTI   . Hypertension   . Midsternal chest pain    a. 05/2014 Stress Echo: Ex time: 8:09, ECG w/o acute changes, EF nl with possible mid and apical anterior HK-->cath recommended.  . Port-A-Cath in place    RIGHT SIDE  . [place]al cancer ([place]  [place]  . [place]    07/2016 tia no neurology followup now seen by pcp  . Syncope and collapse   . Thyroid nodule 2018   FINDINGS CONSISTENT WITH A HURTHLE CELL LESION AND/OR NEOPLASM (BETHESDA CATEGORY IV).    PAST SURGICAL HISTORY :   Past Surgical History:  Procedure Laterality Date  . ABDOMINAL HYSTERECTOMY    . ABDOMINAL PERINEAL BOWEL RESECTION N/A 03/16/2017   Procedure: ABDOMINAL PERINEAL RESECTION;  Surgeon: SChristene Lye MD;  Location: [facility] ORS;  Service: General;  Laterality: N/A;  . APPENDECTOMY  03/16/2017   Procedure: APPENDECTOMY;  Surgeon: SChristene Lye MD;  Location: [facility] OR[facility];  Service: General;;  . BACK SURGERY     l 3,4,5  . BIOPSY THYROID Right 11/16/2016   FINDINGS CONSISTENT WITH A HURTHLE CELL LESION AND/OR NEOPLASM (BETHESDA CATEGORY IV).  .Marland KitchenCHOLECYSTECTOMY    . COLONOSCOPY WITH PROPOFOL N/A 08/15/2016   Procedure: COLONOSCOPY WITH PROPOFOL;  Surgeon: MLollie Sails MD;  [facility];  Service: Endoscopy;  Laterality: N/A;  . COLONOSCOPY WITH PROPOFOL N/A 11/08/2016   Procedure: COLONOSCOPY WITH PROPOFOL;  Surgeon: SChristene Lye MD;  Location: [facility] ENDOSCOPY;  Service: Endoscopy;  Laterality: N/A;  . COLOSTOMY N/A 11/23/2016   Procedure: SIGMOID COLOSTOMY;  Surgeon: SChristene Lye MD;  Location: [facility] ORS;  Service: General;  Laterality: N/A;  . ERCP    . LAPAROSCOPY N/A  11/23/2016   Procedure: LAPAROSCOPY DIAGNOSTIC;  Surgeon: [facility] Lye, MD;  Location: [facility] OR[facility];  Service: General;  Laterality: N/A;  . PARTIAL HYSTERECTOMY    . PORTACATH PLACEMENT N/A 08/18/2016   Procedure: INSERTION PORT-A-CATH;  Surgeon: [facility] Lye, MD;  Location: [facility] OR[facility];  Service: General;  Laterality: N/A;  . THYROID LOBECTOMY Right 11/05/2017   Procedure: THYROID LOBECTOMY;  Surgeon: [facility] Lye, MD;  Location: [facility] ORS;  Service: General;  Laterality: Right;    FAMILY HISTORY :  Mom- gall bladder ca;  breast cancer- 59s; mat grand ma- stomach cancer- 75s; sblings- 2 no cancers; 2 daughters- one daughter? Cervical  Family History  Problem Relation Age of Onset  . Breast cancer Mother   . Hyperlipidemia Father   . Heart disease Paternal Grandmother   . Stroke Maternal Grandfather     SOCIAL HISTORY:   Social History   Tobacco Use  . Smoking status: Former Smoker    Packs/day: 0.25    Years: 35.00    Pack years: 8.75    Types: Cigarettes    Last attempt to quit: 08/11/2016    Years since quitting: 1.5  . Smokeless tobacco: Never Used  Substance Use Topics  . Alcohol use: No  . Drug use: No    ALLERGIES:  is allergic to prednisone.  MEDICATIONS:  Current Outpatient Medications  Medication Sig Dispense Refill  . amLODipine (NORVASC) 10 MG tablet Take 10 mg daily by mouth.    [name] aspirin EC 81 MG tablet Take 81 mg by mouth daily.    . diphenoxylate-atropine (LOMOTIL) 2.5-0.025 MG tablet Take 1 tablet by mouth 4 (four) times daily as needed for diarrhea or loose stools. Take it along with immodium 30 tablet 0  . docusate sodium (COLACE) 100 MG capsule Take 100 mg daily by mouth.    . nitroGLYCERIN (NITROSTAT) 0.4 MG SL tablet Place 1 tablet (0.4 mg total) under the tongue every 5 (five) minutes as needed for chest pain. 25 tablet 6  . traMADol (ULTRAM) 50 MG tablet Take 1 tablet (50 mg total) by mouth every 6 (six) hours as needed (for pain.). (Patient taking differently: Take 100 mg 3 (three) times daily by mouth. ) 30 tablet 0  . venlafaxine XR (EFFEXOR-XR) 37.5 MG 24 hr capsule TAKE ONE CAPSULE BY MOUTH EVERY MORNING WITH BREAKFAST 30 capsule 0  . clopidogrel (PLAVIX) 75 MG tablet Take 75 mg by mouth every evening.     [name] oxyCODONE (OXY IR/ROXICODONE) 5 MG immediate release tablet Take 1-2 tablets (5-10 mg total) by mouth every 4 (four) hours as needed for moderate pain or severe pain. (Patient not taking: Reported on 03/07/2018) 30 tablet 0  . potassium chloride SA (K-DUR,KLOR-CON)  20 MEQ tablet Take 1 tablet (20 mEq total) by mouth 2 (two) times daily. (Patient not taking: Reported on 03/07/2018) 60 tablet 4   No current facility-administered medications for this visit.     PHYSICAL EXAMINATION: ECOG PERFORMANCE STATUS: 1 - Symptomatic but completely ambulatory  BP 126/85 (BP Location: Left Arm, Patient Position: Sitting)   Pulse 70   Temp 98.1 F (36.7 C) (Tympanic)   Resp 16   Wt 160 lb 9.6 oz (72.8 kg)   BMI 25.15 kg/m   Filed Weights   03/07/18 1009  Weight: 160 lb 9.6 oz (72.8 kg)    GENERAL: Well-nourished well-developed; Alert, no distress and comfortable.she is alone. EYES: no pallor or icterus OROPHARYNX: no thrush or ulceration; good dentition  NECK: supple, no masses felt; incision well healing noted. LYMPH:  no palpable lymphadenopathy in the cervical, axillary or inguinal regions LUNGS: clear to auscultation and  No wheeze or crackles HEART/CVS: regular rate & rhythm and no murmurs; No lower extremity edema ABDOMEN:abdomen soft, non-tender and normal bowel sounds Musculoskeletal:no cyanosis of digits and no clubbing  PSYCH: alert & oriented x 3; flat affect NEURO: no focal motor/sensory deficits SKIN:  no rashes or significant lesions  LABORATORY DATA:  I have reviewed the data as listed    Component Value Date/Time   NA 137 03/07/2018 0924   NA 140 03/08/2017 1223   NA 144 07/25/2014 0444   K 3.4 (L) 03/07/2018 0924   K 3.7 07/25/2014 0444   CL 101 03/07/2018 0924   CL 107 07/25/2014 0444   CO2 26 03/07/2018 0924   CO2 30 07/25/2014 0444   GLUCOSE 93 03/07/2018 0924   GLUCOSE 87 07/25/2014 0444   BUN 13 03/07/2018 0924   BUN 13 03/08/2017 1223   BUN 8 07/25/2014 0444   CREATININE 0.69 03/07/2018 0924   CREATININE 0.69 07/25/2014 0444   CALCIUM 8.8 (L) 03/07/2018 0924   CALCIUM 8.4 (L) 07/25/2014 0444   PROT 7.6 03/07/2018 0924   PROT 6.8 03/08/2017 1223   PROT 6.5 07/25/2014 0444   ALBUMIN 3.8 03/07/2018 0924   ALBUMIN  4.2 03/08/2017 1223   ALBUMIN 2.6 (L) 07/25/2014 0444   AST 26 03/07/2018 0924   AST 96 (H) 07/25/2014 0444   ALT 15 03/07/2018 0924   ALT 123 (H) 07/25/2014 0444   ALKPHOS 137 (H) 03/07/2018 0924   ALKPHOS 189 (H) 07/25/2014 0444   BILITOT 0.6 03/07/2018 0924   BILITOT 0.2 03/08/2017 1223   BILITOT 2.0 (H) 07/25/2014 0444   GFRNONAA >60 03/07/2018 0924   GFRNONAA >60 07/25/2014 0444   GFRAA >60 03/07/2018 0924   GFRAA >60 07/25/2014 0444    No results found for: SPEP, UPEP  Lab Results  Component Value Date   WBC 6.0 03/07/2018   NEUTROABS 4.3 03/07/2018   HGB 13.2 03/07/2018   HCT 38.0 03/07/2018   MCV 90.0 03/07/2018   PLT 160 03/07/2018      Chemistry      Component Value Date/Time   NA 137 03/07/2018 0924   NA 140 03/08/2017 1223   NA 144 07/25/2014 0444   K 3.4 (L) 03/07/2018 0924   K 3.7 07/25/2014 0444   CL 101 03/07/2018 0924   CL 107 07/25/2014 0444   CO2 26 03/07/2018 0924   CO2 30 07/25/2014 0444   BUN 13 03/07/2018 0924   BUN 13 03/08/2017 1223   BUN 8 07/25/2014 0444   CREATININE 0.69 03/07/2018 0924   CREATININE 0.69 07/25/2014 0444      Component Value Date/Time   CALCIUM 8.8 (L) 03/07/2018 0924   CALCIUM 8.4 (L) 07/25/2014 0444   ALKPHOS 137 (H) 03/07/2018 0924   ALKPHOS 189 (H) 07/25/2014 0444   AST 26 03/07/2018 0924   AST 96 (H) 07/25/2014 0444   ALT 15 03/07/2018 0924   ALT 123 (H) 07/25/2014 0444   BILITOT 0.6 03/07/2018 0924   BILITOT 0.2 03/08/2017 1223   BILITOT 2.0 (H) 07/25/2014 0444     IMPRESSION: No scintigraphic evidence of osseous metastatic disease.  Abnormal uptake at the LEFT lateral aspect of the lumbar spine at L3-L4 corresponding to degenerative disc disease changes and spur formation on prior radiographs.   Electronically Signed   By: Elta [place]  Thornton Papas M.D.   On: 09/28/2017 15:19  RADIOGRAPHIC STUDIES: I have personally reviewed the radiological images as listed and agreed with the findings in the  report. No results found.   ASSESSMENT & PLAN:  Rectal cancer metastasized to intrapelvic lymph node ([place]) # Rectal  Cancer- stage III- s/p neoadjuvant chemoradiation with 5-FU continuous infusion with radiation; s/p APR April 7th 2018; currently status post adjuvant FOLFOX [finished November 2018].  #Clinically no evidence of recurrence; monitor CEA.  Plan to get a CT scan in approximately 3 months.  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 Improved.   # port flush every 2 months; for now.   # left hip pain with radiculopathy-  MRI lumbar spine- ? Sacral insufficiency; overall improved.   # will wait on CEA from today; if elevated recommend CT scan asap; if not follow up in 2 months/CT scan/CEA/labs/port flush.    No orders of the defined types were placed in this encounter.      Cammie Sickle, MD 03/07/2018 2:24 PM

## 2018-03-08 LAB — CEA: CEA1: 5.4 ng/mL — AB (ref 0.0–4.7)

## 2018-03-11 ENCOUNTER — Telehealth: Payer: Self-pay | Admitting: Internal Medicine

## 2018-03-11 DIAGNOSIS — C20 Malignant neoplasm of rectum: Secondary | ICD-10-CM

## 2018-03-11 DIAGNOSIS — C775 Secondary and unspecified malignant neoplasm of intrapelvic lymph nodes: Principal | ICD-10-CM

## 2018-03-11 NOTE — Telephone Encounter (Signed)
I advised patient of Dr. Aletha Halim recommendations and she verbalized understanding. I advised patient that schedulers will be calling her shortly to set up CT scan.

## 2018-03-11 NOTE — Telephone Encounter (Signed)
Please inform patient that tumor marker/continues to be slightly elevated at 5.4/CEA.   I recommend CT of the chest and pelvis in a week or so; have the patient follow-up with me a few days after the scans; no labs needed. Thx

## 2018-03-18 ENCOUNTER — Ambulatory Visit
Admission: RE | Admit: 2018-03-18 | Discharge: 2018-03-18 | Disposition: A | Discharge status: Home / Self Care | Payer: BLUE CROSS/BLUE SHIELD | Source: Ambulatory Visit | Attending: Internal Medicine | Admitting: Internal Medicine

## 2018-03-18 DIAGNOSIS — Z933 Colostomy status: Secondary | ICD-10-CM | POA: Insufficient documentation

## 2018-03-18 DIAGNOSIS — J439 Emphysema, unspecified: Secondary | ICD-10-CM | POA: Insufficient documentation

## 2018-03-18 DIAGNOSIS — R918 Other nonspecific abnormal finding of lung field: Secondary | ICD-10-CM | POA: Insufficient documentation

## 2018-03-18 DIAGNOSIS — J984 Other disorders of lung: Secondary | ICD-10-CM | POA: Insufficient documentation

## 2018-03-18 DIAGNOSIS — C775 Secondary and unspecified malignant neoplasm of intrapelvic lymph nodes: Secondary | ICD-10-CM | POA: Diagnosis not present

## 2018-03-18 DIAGNOSIS — C20 Malignant neoplasm of rectum: Secondary | ICD-10-CM | POA: Diagnosis not present

## 2018-03-18 DIAGNOSIS — Z9049 Acquired absence of other specified parts of digestive tract: Secondary | ICD-10-CM | POA: Diagnosis not present

## 2018-03-18 MED ORDER — IOHEXOL 300 MG/ML  SOLN
100.0000 mL | Freq: Once | INTRAMUSCULAR | Status: AC | PRN
  Administered 2018-03-18: 100 mL via INTRAVENOUS

## 2018-03-20 ENCOUNTER — Telehealth: Payer: Self-pay | Admitting: *Deleted

## 2018-03-20 ENCOUNTER — Telehealth: Payer: Self-pay | Admitting: Internal Medicine

## 2018-03-20 DIAGNOSIS — C775 Secondary and unspecified malignant neoplasm of intrapelvic lymph nodes: Principal | ICD-10-CM

## 2018-03-20 DIAGNOSIS — C20 Malignant neoplasm of rectum: Secondary | ICD-10-CM

## 2018-03-20 NOTE — Telephone Encounter (Signed)
Please inform patient that CT scan negative for any recurrence.   Recommend follow-up in 2 monthsMD-/CBC CMP CEA/port flush

## 2018-03-20 NOTE — Addendum Note (Signed)
Addended by: Sabino Gasser on: 03/20/2018 01:43 PM   Modules accepted: Orders

## 2018-03-20 NOTE — Telephone Encounter (Signed)
Patient called asking for results of CT done Monday, She has no FOLLOW UP appointment scheduled. Please return her call [phone]

## 2018-03-20 NOTE — Telephone Encounter (Signed)
See md phone note 

## 2018-03-20 NOTE — Telephone Encounter (Signed)
Rachel [name], please schedule patient for lab/md in 2 months

## 2018-04-03 ENCOUNTER — Inpatient Hospital Stay (HOSPITAL_BASED_OUTPATIENT_CLINIC_OR_DEPARTMENT_OTHER): Payer: BLUE CROSS/BLUE SHIELD | Admitting: Oncology

## 2018-04-03 ENCOUNTER — Inpatient Hospital Stay: Payer: BLUE CROSS/BLUE SHIELD

## 2018-04-03 ENCOUNTER — Inpatient Hospital Stay: Payer: BLUE CROSS/BLUE SHIELD | Attending: Internal Medicine

## 2018-04-03 ENCOUNTER — Inpatient Hospital Stay: Payer: Self-pay

## 2018-04-03 DIAGNOSIS — M545 Low back pain: Secondary | ICD-10-CM

## 2018-04-03 DIAGNOSIS — Z85048 Personal history of other malignant neoplasm of rectum, rectosigmoid junction, and anus: Secondary | ICD-10-CM | POA: Insufficient documentation

## 2018-04-03 DIAGNOSIS — C20 Malignant neoplasm of rectum: Secondary | ICD-10-CM

## 2018-04-03 DIAGNOSIS — Z79899 Other long term (current) drug therapy: Secondary | ICD-10-CM | POA: Insufficient documentation

## 2018-04-03 DIAGNOSIS — G8929 Other chronic pain: Secondary | ICD-10-CM | POA: Insufficient documentation

## 2018-04-03 DIAGNOSIS — E041 Nontoxic single thyroid nodule: Secondary | ICD-10-CM | POA: Insufficient documentation

## 2018-04-03 DIAGNOSIS — Z9221 Personal history of antineoplastic chemotherapy: Secondary | ICD-10-CM | POA: Insufficient documentation

## 2018-04-03 DIAGNOSIS — C775 Secondary and unspecified malignant neoplasm of intrapelvic lymph nodes: Principal | ICD-10-CM

## 2018-04-03 DIAGNOSIS — Z95828 Presence of other vascular implants and grafts: Secondary | ICD-10-CM

## 2018-04-03 MED ORDER — SODIUM CHLORIDE 0.9% FLUSH
10.0000 mL | INTRAVENOUS | Status: AC | PRN
  Administered 2018-04-03: 10 mL
  Filled 2018-04-03: qty 10

## 2018-04-03 MED ORDER — HEPARIN SOD (PORK) LOCK FLUSH 100 UNIT/ML IV SOLN
500.0000 [IU] | INTRAVENOUS | Status: AC | PRN
  Administered 2018-04-03: 500 [IU]

## 2018-04-03 NOTE — Progress Notes (Signed)
Survivorship Care Plan visit completed.  Treatment summary reviewed and given to patient.  ASCO answers booklet reviewed and given to patient.  CARE program and Cancer Transitions discussed with patient along with other resources cancer center offers to patients and caregivers.  Patient verbalized understanding.    

## 2018-04-03 NOTE — Progress Notes (Signed)
 Survivorship Clinic Consult Note [facility]  [phone] [phone]  CLINIC:  Survivorship  REASON FOR VISIT:  Long-term survivorship surveillance visit for patient with history of rectal cancer with metastasis to intrapelvic lymph node  BRIEF ONCOLOGIC HISTORY:  Oncology History   # SEP 2017- RECTAL CA mod diff adeno; STAGE III [no EUS; positive ~7mm LN; Dr.sankar] CEA-7; Sep 21st START 5FU-RT [finished NOV 2017]; April 7th APR- 2018- [id number] [5LN]; positive for LVI/PNI [poor response to chemo]; Margins-Negative.  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 Brahmanday on 03/04/2018 status post adjuvant chemotherapy for follow-up. Patient admitted to continue tingling and numbness in bilateral feet but noted improvement. Her back pain has mostly resolved. She denied any additional symptoms at this time. Patient received neoadjuvant chemotherapy/radiation with 5-FU continuous infusion with radiation. Status post APR 03/17/2017 and status post adjuvant FOLFOX finished in November 2018.   Patient clinically has no evidence of recurrence. Plan is to monitor her CEA. Follow-up CT scan approximately q 3 months. CEA from 03/07/2018 was 5.4. Previously 5.3.   Dr. Brahmanday recommended a STAT CT scan given her CEA is slightly elevated. CT scan from 03/18/2018 revealed no evidence of recurrence  ADDITIONAL REVIEW OF SYSTEMS:  Review of Systems  Constitutional: Negative.  Negative for chills, fever, malaise/fatigue and weight loss.  HENT: Negative for congestion and ear pain.   Eyes:  Negative.  Negative for blurred vision and double vision.  Respiratory: Negative.  Negative for cough, sputum production and shortness of breath.   Cardiovascular: Negative.  Negative for chest pain, palpitations and leg swelling.  Gastrointestinal: Positive for blood in stool. Negative for abdominal pain, constipation, diarrhea, nausea and vomiting.       Occasional oozing from rectum- Schedule to follow-up with Dr. Burnette tomorrow.   Genitourinary: Negative for dysuria, frequency and urgency.  Musculoskeletal: Negative for back pain and falls.  Skin: Negative.  Negative for rash.  Neurological: Negative.  Negative for weakness and headaches.  Endo/Heme/Allergies: Negative.  Does not bruise/bleed easily.  Psychiatric/Behavioral: Negative.  Negative for depression. The patient is not nervous/anxious and does not have insomnia.    PAST MEDICAL & SURGICAL HISTORY:  Past Medical History:  Diagnosis Date  . Anginal pain (HCC)   . Arthritis   . Body mass index (BMI) of 24.0-24.9 in adult   . Cardiovascular disease   . Current every day smoker    a. ~35 years - > has cut down to < 3/4 ppd  . Headache   . History of UTI   . Hypertension   . Midsternal chest pain    a. 05/2014 Stress Echo: Ex time: 8:09, ECG w/o acute changes, EF nl with possible mid and apical anterior HK-->cath recommended.  . Port-A-Cath in place    RIGHT SIDE  . Rectal cancer (HCC) 2017  . Stroke (HCC)    07/2016 tia no neurology followup now seen by pcp  . Syncope and collapse   . Thyroid nodule 2018   FINDINGS CONSISTENT WITH A HURTHLE CELL LESION AND/OR NEOPLASM (BETHESDA CATEGORY IV).   Past Surgical History:  Procedure   Laterality Date  . ABDOMINAL HYSTERECTOMY    . ABDOMINAL PERINEAL BOWEL RESECTION N/A 03/16/2017   Procedure: ABDOMINAL PERINEAL RESECTION;  Surgeon: [name] [name], MD;  Location: [facility] ORS;  Service: General;  Laterality: N/A;  . APPENDECTOMY  03/16/2017   Procedure: APPENDECTOMY;   Surgeon: [name] [name], MD;  Location: [facility] ORS;  Service: General;;  . BACK SURGERY     l 3,4,5  . BIOPSY THYROID Right 11/16/2016   FINDINGS CONSISTENT WITH A HURTHLE CELL LESION AND/OR NEOPLASM (BETHESDA CATEGORY IV).  . CHOLECYSTECTOMY    . COLONOSCOPY WITH PROPOFOL N/A 08/15/2016   Procedure: COLONOSCOPY WITH PROPOFOL;  Surgeon: Martin U Skulskie, MD;  Location: [facility] ENDOSCOPY;  Service: Endoscopy;  Laterality: N/A;  . COLONOSCOPY WITH PROPOFOL N/A 11/08/2016   Procedure: COLONOSCOPY WITH PROPOFOL;  Surgeon: [name] [name], MD;  Location: [facility] ENDOSCOPY;  Service: Endoscopy;  Laterality: N/A;  . COLOSTOMY N/A 11/23/2016   Procedure: SIGMOID COLOSTOMY;  Surgeon: [name] [name], MD;  Location: [facility]S;  Service: General;  Laterality: N/A;  . ERCP    . LAPAROSCOPY N/A 11/23/2016   Procedure: LAPAROSCOPY DIAGNOSTIC;  Surgeon: [name] [name], MD;  Location: [facility];  Service: General;  Laterality: N/A;  . PARTIAL HYSTERECTOMY    . PORTACATH PLACEMENT N/A 08/18/2016   Procedure: INSERTION PORT-A-CATH;  Surgeon: [name] [name], MD;  Location: [facility];  Service: General;  Laterality: N/A;  . THYROID LOBECTOMY Right 11/05/2017   Procedure: THYROID LOBECTOMY;  Surgeon: [name], [name], MD;  Location: [facility] ORS;  Service: General;  Laterality: Right;    CURRENT MEDICATIONS:  Current Outpatient Medications on File Prior to Visit  Medication Sig Dispense Refill  . amLODipine (NORVASC) 10 MG tablet Take 10 mg daily by mouth.    . aspirin EC 81 MG tablet Take 81 mg by mouth daily.    . clopidogrel (PLAVIX) 75 MG tablet Take 75 mg by mouth every evening.     . diphenoxylate-atropine (LOMOTIL) 2.5-0.025 MG tablet Take 1 tablet by mouth 4 (four) times daily as needed for diarrhea or loose stools. Take it along with immodium 30 tablet 0  . docusate sodium (COLACE) 100 MG capsule Take 100 mg daily by mouth.    . nitroGLYCERIN (NITROSTAT) 0.4 MG SL tablet Place  1 tablet (0.4 mg total) under the tongue every 5 (five) minutes as needed for chest pain. 25 tablet 6  . oxyCODONE (OXY IR/ROXICODONE) 5 MG immediate release tablet Take 1-2 tablets (5-10 mg total) by mouth every 4 (four) hours as needed for moderate pain or severe pain. (Patient not taking: Reported on 03/07/2018) 30 tablet 0  . potassium chloride SA (K-DUR,KLOR-CON) 20 MEQ tablet Take 1 tablet (20 mEq total) by mouth 2 (two) times daily. (Patient not taking: Reported on 03/07/2018) 60 tablet 4  . traMADol (ULTRAM) 50 MG tablet Take 1 tablet (50 mg total) by mouth every 6 (six) hours as needed (for pain.). (Patient taking differently: Take 100 mg 3 (three) times daily by mouth. ) 30 tablet 0  . venlafaxine XR (EFFEXOR-XR) 37.5 MG 24 hr capsule TAKE ONE CAPSULE BY MOUTH EVERY MORNING WITH BREAKFAST 30 capsule 0   No current facility-administered medications on file prior to visit.     ALLERGIES:  Allergies  Allergen Reactions  . Prednisone Other (See Comments)    BLISTERS IN MOUTH AND ON TONGUE    PHYSICAL EXAM:  There were no vitals filed for this visit. There were no vitals filed for   this visit.  Physical Exam  Constitutional: She is oriented to person, place, and time. Vital signs are normal.  HENT:  Head: Normocephalic and atraumatic.  Eyes: Pupils are equal, round, and reactive to light.  Neck: Normal range of motion.  Cardiovascular: Normal rate, regular rhythm and normal heart sounds.  No murmur heard. Pulmonary/Chest: Effort normal and breath sounds normal. She has no wheezes.  Abdominal: Soft. Normal appearance and bowel sounds are normal. She exhibits no distension. There is no tenderness.  Musculoskeletal: Normal range of motion. She exhibits no edema.  Neurological: She is alert and oriented to person, place, and time.  Skin: Skin is warm and dry. No rash noted.  Psychiatric: Judgment normal.    LABORATORY DATA:  None at this visit.  Most recent CEA: 10/2017 5.3 and  03/07/18 5.4  DIAGNOSTIC IMAGING:   CT Abdomen Pelvis 03/18/18  IMPRESSION: 1. Status post APR and left lower quadrant colostomy. Probable postoperative changes and radiation changes in the perirectal space and presacral area. No postoperative CTs are available for comparison. 2. No findings suspicious for recurrent tumor, regional adenopathy or metastatic disease elsewhere. 3. Stable bilateral adrenal gland nodules. 4. Status post cholecystectomy with pneumobilia likely related to sphincterotomy. 5. Emphysematous changes are noted in lungs with biapical pleural and parenchymal scarring. Small stable scattered pulmonary nodules, likely benign. No findings suspicious for pulmonary metastatic disease.  CT Chest 03/18/18  IMPRESSION: 1. Status post APR and left lower quadrant colostomy. Probable postoperative changes and radiation changes in the perirectal space and presacral area. No postoperative CTs are available for comparison. 2. No findings suspicious for recurrent tumor, regional adenopathy or metastatic disease elsewhere. 3. Stable bilateral adrenal gland nodules. 4. Status post cholecystectomy with pneumobilia likely related to sphincterotomy. 5. Emphysematous changes are noted in lungs with biapical pleural and parenchymal scarring. Small stable scattered pulmonary nodules, likely benign. No findings suspicious for pulmonary metastatic disease.   ASSESSMENT & PLAN:  Ms. Holroyd is a pleasant 61 y.o. female with history of Stage III Rectal Cancer, treated with 6 doses of 5-FU and 10 cycles of FOLFOX; completed treatment on 10/15/2017.  Patient presents to survivorship clinic today for survivorship care plan visit and to address any acute survivorship concerns since completing treatment.    1. History of stage III Rectal cancer: Clinically, she is without evidence of disease recurrence based on physical exam/diagnostic imaging.  Today, she received a copy of her survivorship  care plan (SCP) document, which was reviewed with her in detail.  The SCP details her cancer treatment history and potential late/long-term side effects of those treatments.  We discussed the follow-up schedule she can anticipate with interval imaging for surveillance of her cancer.  I have also shared a copy of her treatment summary/SCP with her PCP.  Ms. Romberg will return to the survivorship clinic as needed; she will return to [facility] for surveillance visit with Dr. Brahmanday in 2 months.   Per NCCN Guidelines for Stage III Rectal Cancer surveillance is as follows:    2. Problem at visit: Intermittent oozing from rectum with brown/red discharge. She is scheduled to follow-up with Dr. Byrnett tomorrow and will ask him about this concern. Patient is having to wear a pad daily for leak prevention. Oozing is intermittent and not present everyday.  Previously discussed peripheral neuropathy has resolved. Patient continues to experience mild chronic lower back pain. Takes OTC pain relievers with some relief. She will follow-up with PCP for this chronic condition.    3. Smoking cessation: I commended Ms. Jurich's continued efforts to remain tobacco-free.  We discussed that one of the most important risk reduction strategies in preventing recurrence is smoking cessation.  She is committed to abstaining from tobacco.  4. Physical activity/Healthy eating: Getting adequate physical activity and maintaining a healthy diet as a cancer survivor is important for overall wellness and reduces the risk of cancer recurrence. We discussed the CARE program and which is a fitness program that is offered to cancer survivors free of charge.  We also reviewed the American Cancer Society's booklet with recommendations for nutrition and physical activity.    4. Health promotion/Cancer screening:  Ms. Saadeh is reportedly up-to-date on her colonoscopy, pap smear, skin screenings, and vaccinations.  I  encouraged her to talk with his PCP about arranging appropriate cancer screening tests, as appropriate.   5. Support services/Counseling: Ms. Aydt was seen today in in effort to address both the physical and social concerns of our cancer survivors at [facility]. It is not uncommon for this period of the patient's cancer care trajectory to be one of many emotions and stressors.  I provided support today through active listening, validation of concerns, and expressive supportive counseling.  Ms. Harron was encouraged to take advantage of our support services programs and support groups to better cope in her new life as a cancer survivor after completing anti-cancer treatment. Cancer transitions was thoroughly discussed with patient by survivorship coordinator Rosa Skidgel.  Dispo:  -Return to survivorship clinic as needed; no additional follow-up needed at this time.  -Consider transitioning the patient to long-term survivorship, when clinically appropriate.   A total of 30 minutes was spent in face-to-face care of this patient, with greater than 50% of that time spent in counseling and care coordination.   Jenny Burns, AGNP-C Survivorship Clinic [facility] [phone] (office) 04/03/18 11:48 AM 

## 2018-04-04 ENCOUNTER — Ambulatory Visit (INDEPENDENT_AMBULATORY_CARE_PROVIDER_SITE_OTHER): Payer: BLUE CROSS/BLUE SHIELD | Admitting: General Surgery

## 2018-04-04 ENCOUNTER — Encounter: Payer: Self-pay | Admitting: General Surgery

## 2018-04-04 VITALS — BP 122/78 | HR 72 | Ht 67.0 in | Wt 162.0 lb

## 2018-04-04 DIAGNOSIS — D34 Benign neoplasm of thyroid gland: Secondary | ICD-10-CM

## 2018-04-04 DIAGNOSIS — N739 Female pelvic inflammatory disease, unspecified: Secondary | ICD-10-CM | POA: Diagnosis not present

## 2018-04-04 DIAGNOSIS — C20 Malignant neoplasm of rectum: Secondary | ICD-10-CM | POA: Diagnosis not present

## 2018-04-04 NOTE — Patient Instructions (Signed)
Patient to have a incision and drainage rectal area.  The patient is aware to call back for any questions or concerns.

## 2018-04-04 NOTE — Progress Notes (Signed)
Patient ID: Rachel [name], female   DOB: February 12, 1956, 62 y.o.   MRN: [id number]  Chief Complaint  Patient presents with  . Colon Cancer    HPI [name] is a 62 y.o. female here today for her follow up thyroid lobectomy  done on 11/05/2017 and rectal cancer. She states the area at her retcal area is draining.There is some blood and green discharge that comes out. Ct scan done on 03/18/2018.  HPI  Past Medical History:  Diagnosis Date  . Anginal pain ([place])   . Arthritis   . Body mass index (BMI) of 24.0-24.9 in adult   . Cardiovascular disease   . Current every day smoker    a. ~35 years - > has cut down to < 3/4 ppd  . Headache   . History of UTI   . Hypertension   . Midsternal chest pain    a. 05/2014 Stress Echo: Ex time: 8:09, ECG w/o acute changes, EF nl with possible mid and apical anterior HK-->cath recommended.  . Port-A-Cath in place    RIGHT SIDE  . Rectal cancer ([name]) 2017  . Stroke ([place])    07/2016 tia no neurology followup now seen by pcp  . Syncope and collapse   . Thyroid nodule 2018   FINDINGS CONSISTENT WITH A HURTHLE CELL LESION AND/OR NEOPLASM (BETHESDA CATEGORY IV).    Past Surgical History:  Procedure Laterality Date  . ABDOMINAL HYSTERECTOMY    . ABDOMINAL PERINEAL BOWEL RESECTION N/A 03/16/2017   Procedure: ABDOMINAL PERINEAL RESECTION;  Surgeon: Christene Lye, MD;  Location: [facility] ORS;  Service: General;  Laterality: N/A;  . APPENDECTOMY  03/16/2017   Procedure: APPENDECTOMY;  Surgeon: Christene Lye, MD;  Location: [facility];  Service: General;;  . BACK SURGERY     l 3,4,5  . BIOPSY THYROID Right 11/16/2016   FINDINGS CONSISTENT WITH A HURTHLE CELL LESION AND/OR NEOPLASM (BETHESDA CATEGORY IV).  [name] CHOLECYSTECTOMY    . COLONOSCOPY WITH PROPOFOL N/A 08/15/2016   Procedure: COLONOSCOPY WITH PROPOFOL;  Surgeon: Lollie Sails, MD;  Location: [facility] ENDOSCOPY;  Service: Endoscopy;  Laterality: N/A;  . COLONOSCOPY WITH PROPOFOL N/A 11/08/2016    Procedure: COLONOSCOPY WITH PROPOFOL;  Surgeon: Christene Lye, MD;  Location: [facility] ENDOSCOPY;  Service: Endoscopy;  Laterality: N/A;  . COLOSTOMY N/A 11/23/2016   Procedure: SIGMOID COLOSTOMY;  Surgeon: Christene Lye, MD;  Location: [facility] OR[facility];  Service: General;  Laterality: N/A;  . ERCP    . LAPAROSCOPY N/A 11/23/2016   Procedure: LAPAROSCOPY DIAGNOSTIC;  Surgeon: Christene Lye, MD;  Location: [facility] ORS;  Service: General;  Laterality: N/A;  . PARTIAL HYSTERECTOMY    . PORTACATH PLACEMENT N/A 08/18/2016   Procedure: INSERTION PORT-A-CATH;  Surgeon: Christene Lye, MD;  Location: [facility] ORS;  Service: General;  Laterality: N/A;  . THYROID LOBECTOMY Right 11/05/2017   Procedure: THYROID LOBECTOMY;  Surgeon: Christene Lye, MD;  Location: [facility] ORS;  Service: General;  Laterality: Right;    Family History  Problem Relation Age of Onset  . Breast cancer Mother   . Hyperlipidemia Father   . Heart disease Paternal Grandmother   . Stroke Maternal Grandfather     Social History Social History   Tobacco Use  . Smoking status: Former Smoker    Packs/day: 0.25    Years: 35.00    Pack years: 8.75    Types: Cigarettes    Last attempt to quit: 08/11/2016    Years since quitting:  1.6  . Smokeless tobacco: Never Used  Substance Use Topics  . Alcohol use: No  . Drug use: No    Allergies  Allergen Reactions  . Prednisone Other (See Comments)    BLISTERS IN MOUTH AND ON TONGUE    Current Outpatient Medications  Medication Sig Dispense Refill  . amLODipine (NORVASC) 10 MG tablet Take 10 mg daily by mouth.    [name] aspirin EC 81 MG tablet Take 81 mg by mouth daily.    . clopidogrel (PLAVIX) 75 MG tablet Take 75 mg by mouth every evening.     . diphenoxylate-atropine (LOMOTIL) 2.5-0.025 MG tablet Take 1 tablet by mouth 4 (four) times daily as needed for diarrhea or loose stools. Take it along with immodium 30 tablet 0  . docusate sodium (COLACE) 100 MG  capsule Take 100 mg daily by mouth.    . nitroGLYCERIN (NITROSTAT) 0.4 MG SL tablet Place 1 tablet (0.4 mg total) under the tongue every 5 (five) minutes as needed for chest pain. 25 tablet 6  . oxyCODONE (OXY IR/ROXICODONE) 5 MG immediate release tablet Take 1-2 tablets (5-10 mg total) by mouth every 4 (four) hours as needed for moderate pain or severe pain. 30 tablet 0  . potassium chloride SA (K-DUR,KLOR-CON) 20 MEQ tablet Take 1 tablet (20 mEq total) by mouth 2 (two) times daily. 60 tablet 4  . traMADol (ULTRAM) 50 MG tablet Take 1 tablet (50 mg total) by mouth every 6 (six) hours as needed (for pain.). (Patient taking differently: Take 100 mg 3 (three) times daily by mouth. ) 30 tablet 0  . venlafaxine XR (EFFEXOR-XR) 37.5 MG 24 hr capsule TAKE ONE CAPSULE BY MOUTH EVERY MORNING WITH BREAKFAST 30 capsule 0   No current facility-administered medications for this visit.     Review of Systems Review of Systems  Constitutional: Negative.   Respiratory: Negative.   Cardiovascular: Negative.     Blood pressure 122/78, pulse 72, height 5\' 7"  (1.702 m), weight 162 lb (73.5 kg).  Physical Exam Physical Exam  Constitutional: She is oriented to person, place, and time. She appears well-developed and well-nourished.  Eyes: Conjunctivae are normal. No scleral icterus.  Neck: Neck supple.  Cardiovascular: Normal rate, regular rhythm and normal heart sounds.  Pulmonary/Chest: Effort normal and breath sounds normal.  Abdominal: Soft. Bowel sounds are normal. There is no tenderness.  Genitourinary:     Lymphadenopathy:    She has no cervical adenopathy.  Neurological: She is alert and oriented to person, place, and time.  Skin: Skin is warm and dry.    Data Reviewed March 18, 2018 CT of the abdomen and pelvis reviewed. Stomach/Bowel: The stomach, duodenum, small bowel and colon are unremarkable. Left lower quadrant colostomy noted. Status post APR with probable postoperative changes with  granulation tissue and small loculated fluid collection. No other post surgical CTs are available for comparison. No definite findings for residual or recurrent tumor but difficult to assess with no prior for Comparison.   March 07, 2018 CBC showed a hemoglobin of 13.2, MCV of 90, white blood cell count of 6000 with normal differential, platelet count of 160,000.  CEA remains minimally elevated at 5.5.  Assessment    Chronic perineal drainage since APR 1 year ago.  Likely related to chronic residual inflammation from preoperative radiation and subsequent postoperative scarring.    Plan  The patient has intermittent drainage p, usually occurring every 2-3 days.  I suspect this is when the pressure builds up enough  to overcome the resistance from the draining sinus.  Based on review of the CT I think opening this area will allow for better drainage and more likely total closure.    The patient is aware to call back for any questions or concerns.   HPI, Physical Exam, Assessment and Plan have been scribed under the direction and in the presence of Hervey Ard, MD.  [name], CMA  I have completed the exam and reviewed the above documentation for accuracy and completeness.  I agree with the above.  Haematologist has been used and any errors in dictation or transcription are unintentional.  Hervey Ard, M.D., F.A.C.S.  Patient's surgery has been scheduled for 04-15-18 at [facility]. It is okay for patient to continue an 81 mg aspirin once daily. Patient reports she is no longer taking Plavix.  Dominga Ferry, CMA

## 2018-04-09 ENCOUNTER — Other Ambulatory Visit: Payer: Self-pay

## 2018-04-09 ENCOUNTER — Encounter
Admission: RE | Admit: 2018-04-09 | Discharge: 2018-04-09 | Disposition: A | Discharge status: Home / Self Care | Payer: BLUE CROSS/BLUE SHIELD | Source: Ambulatory Visit | Attending: General Surgery | Admitting: General Surgery

## 2018-04-09 NOTE — Pre-Procedure Instructions (Signed)
Rachel [name]  ECHO COMPLETE WO IMAGING ENHANCING AGENT  Order# [id number]  Reading physician: Lelon Perla, MD Ordering physician: Edwin Dada, MD Study date: 07/17/16  Study Result   Result status: Final result                              *[facility]*                         [address], [place] [address]                            [phone]  ------------------------------------------------------------------- Transthoracic Echocardiography  Patient:    Rachel [name], Rachel [name] MR #:       [id number] Study Date: 07/17/2016 Gender:     F Age:        62 Height:     167.6 cm Weight:     67.5 kg BSA:        1.78 m^2 Pt. Status: Room:       [address]    Rachel [name] [id number]  PERFORMING   [facility], Inpatient  SONOGRAPHER  [facility]  ADMITTING    Edwin Dada  ORDERING     Danford, Christopher P  REFERRING    Myrene Buddy P  cc:  ------------------------------------------------------------------- LV EF: 50% -   55%  ------------------------------------------------------------------- Indications:      CVA 62.  ------------------------------------------------------------------- History:   PMH:   Angina pectoris.  Risk factors:  Current tobacco use. Hypertension.  ------------------------------------------------------------------- Study Conclusions  - Left ventricle: The cavity size was normal. Wall thickness was   increased in a pattern of mild LVH. Systolic function was normal.   The estimated ejection fraction was in the range of 50% to 55%.   Wall motion was normal; there were no regional wall motion   abnormalities. Doppler parameters are consistent with abnormal   left ventricular relaxation (grade 1 diastolic dysfunction).   Doppler parameters are consistent with high ventricular filling   pressure. - Aortic valve: There was trivial  regurgitation.  Impressions:  - Low normal LV systolic function; grade 1 diastolic dysfunction   with elevated LV filling pressure; trace AI and MR; mild TR.  ------------------------------------------------------------------- Study data:  No prior study was available for comparison.  Study status:  Routine.  Procedure:  The patient reported no pain pre or post test. Transthoracic echocardiography. Image quality was adequate.  Study completion:  There were no complications. Transthoracic echocardiography.  M-mode, complete 2D, spectral Doppler, and color Doppler.  Birthdate:  Patient birthdate: January 10, 1956.  Age:  Patient is 62 yr old.  Sex:  Gender: female. BMI: 24 kg/m^2.  Blood pressure:     159/92  Patient status: Inpatient.  Study date:  Study date: 07/17/2016. Study time: 01:51 PM.  Location:  Bedside.  -------------------------------------------------------------------  ------------------------------------------------------------------- Left ventricle:  The cavity size was normal. Wall thickness was increased in a pattern of mild LVH. Systolic function was normal. The estimated ejection fraction was in the range of 50% to 55%. Wall motion was normal; there were no regional wall  motion abnormalities. Doppler parameters are consistent with abnormal left ventricular relaxation (grade 1 diastolic dysfunction). Doppler parameters are consistent with high ventricular filling pressure.   ------------------------------------------------------------------- Aortic valve:   Trileaflet; mildly thickened leaflets. Mobility was not restricted.  Doppler:  Transvalvular velocity was within the normal range. There was no stenosis. There was trivial regurgitation.  ------------------------------------------------------------------- Aorta:  Aortic root: The aortic root was normal in size.  ------------------------------------------------------------------- Mitral valve:    Structurally normal valve.   Mobility was not restricted.  Doppler:  Transvalvular velocity was within the normal range. There was no evidence for stenosis. There was trivial regurgitation.  ------------------------------------------------------------------- Left atrium:  The atrium was normal in size.  ------------------------------------------------------------------- Right ventricle:  The cavity size was normal. Systolic function was normal.  ------------------------------------------------------------------- Pulmonic valve:    Doppler:  Transvalvular velocity was within the normal range. There was no evidence for stenosis.  ------------------------------------------------------------------- Tricuspid valve:   Structurally normal valve.    Doppler: Transvalvular velocity was within the normal range. There was mild regurgitation.  ------------------------------------------------------------------- Pulmonary artery:   Systolic pressure was within the normal range.   ------------------------------------------------------------------- Right atrium:  The atrium was normal in size.  ------------------------------------------------------------------- Pericardium:  There was no pericardial effusion.  ------------------------------------------------------------------- Systemic veins: Inferior vena cava: The vessel was normal in size.  ------------------------------------------------------------------- Measurements   Left ventricle                           Value        Reference  LV ID, ED, PLAX chordal                  45    mm     43 - 52  LV ID, ES, PLAX chordal                  26    mm     23 - 38  LV fx shortening, PLAX chordal           42    %      >=29  LV PW thickness, ED                      11.06 mm     ---------  IVS/LV PW ratio, ED                      1.08         <=1.3  Stroke volume, 2D                        63    ml     ---------  Stroke volume/bsa,  2D                    35    ml/m^2 ---------  LV e&', lateral                           9.68  cm/s   ---------  LV e&', medial                            3.92  cm/s   ---------  LV e&', average  6.8   cm/s   ---------    Ventricular septum                       Value        Reference  IVS thickness, ED                        12    mm     ---------    LVOT                                     Value        Reference  LVOT ID, S                               17    mm     ---------  LVOT area                                2.27  cm^2   ---------  LVOT peak velocity, S                    105   cm/s   ---------  LVOT mean velocity, S                    66.3  cm/s   ---------  LVOT VTI, S                              27.7  cm     ---------  LVOT peak gradient, S                    4     mm Hg  ---------    Aortic valve                             Value        Reference  Aortic regurg pressure half-time         760   ms     ---------    Aorta                                    Value        Reference  Aortic root ID, ED                       36.58 mm     ---------    Left atrium                              Value        Reference  LA ID, A-P, ES                           27    mm     ---------  LA ID/bsa, A-P  1.52  cm/m^2 <=2.2  LA volume, S                             45.8  ml     ---------  LA volume/bsa, S                         25.7  ml/m^2 ---------  LA volume, ES, 1-p A4C                   38.5  ml     ---------  LA volume/bsa, ES, 1-p A4C               21.6  ml/m^2 ---------  LA volume, ES, 1-p A2C                   46.5  ml     ---------  LA volume/bsa, ES, 1-p A2C               26.1  ml/m^2 ---------    Mitral valve                             Value        Reference  Mitral deceleration time         (H)     292   ms     150 - 230  Mitral E/A ratio, peak                   1.1          ---------    Pulmonary arteries                        Value        Reference  PA pressure, S, DP                       24    mm Hg  <=30    Tricuspid valve                          Value        Reference  Tricuspid regurg peak velocity           227   cm/s   ---------  Tricuspid peak RV-RA gradient            21    mm Hg  ---------    Systemic veins                           Value        Reference  Estimated CVP                            3     mm Hg  ---------    Right ventricle                          Value        Reference  TAPSE  25.6  mm     ---------  RV pressure, S, DP                       24    mm Hg  <=30  RV s&', lateral, S                        13.3  cm/s   ---------  Legend: (L)  and  (H)  mark values outside specified reference range.  ------------------------------------------------------------------- Prepared and Electronically Authenticated by  Kirk Ruths 2017-08-07T14:41:11  MERGE Images   Show images for Echocardiogram  Patient Information   Patient Name Marcelline, Temkin Sex Female DOB 1956/04/24 SSN [id number]  Reason for Exam  Priority: Routine  Not on file  Surgical History   Surgical History   No past medical history on file.    Other Surgical History   Procedure Laterality Date Comment Source  ABDOMINAL HYSTERECTOMY    Provider  ABDOMINAL PERINEAL BOWEL RESECTION N/A 03/16/2017 Procedure: ABDOMINAL PERINEAL RESECTION; Surgeon: Christene Lye, MD; Location: [facility] ORS; Service: General; Laterality: N/A; Provider  APPENDECTOMY  03/16/2017 Procedure: APPENDECTOMY; Surgeon: Christene Lye, MD; Location: [facility] ORS; Service: General;; Provider  BACK SURGERY   l 3,4,5 Provider  BIOPSY THYROID Right 11/16/2016 FINDINGS CONSISTENT WITH A HURTHLE CELL LESION AND/OR NEOPLASM (BETHESDA CATEGORY IV). Provider  CHOLECYSTECTOMY    Provider  COLONOSCOPY WITH PROPOFOL N/A 08/15/2016 Procedure: COLONOSCOPY WITH PROPOFOL; Surgeon: Lollie Sails, MD;  Location: [facility] ENDOSCOPY; Service: Endoscopy; Laterality: N/A; Provider  COLONOSCOPY WITH PROPOFOL N/A 11/08/2016 Procedure: COLONOSCOPY WITH PROPOFOL; Surgeon: Christene Lye, MD; Location: [facility] ENDOSCOPY; Service: Endoscopy; Laterality: N/A; Provider  COLOSTOMY N/A 11/23/2016 Procedure: SIGMOID COLOSTOMY; Surgeon: Christene Lye, MD; Location: [facility] ORS; Service: General; Laterality: N/A; Provider  ERCP    Provider  LAPAROSCOPY N/A 11/23/2016 Procedure: LAPAROSCOPY DIAGNOSTIC; Surgeon: Christene Lye, MD; Location: [facility] ORS; Service: General; Laterality: N/A; Provider  PARTIAL HYSTERECTOMY    Provider  PORTACATH PLACEMENT N/A 08/18/2016 Procedure: INSERTION PORT-A-CATH; Surgeon: Christene Lye, MD; Location: [facility] ORS; Service: General; Laterality: N/A; Provider  THYROID LOBECTOMY Right 11/05/2017 Procedure: THYROID LOBECTOMY; Surgeon: Christene Lye, MD; Location: [facility] ORS; Service: General; Laterality: Right; Provider    Patient Data   Height 66 in    BP 159/92 mmHg       Performing Technologist/Nurse   Performing Technologist/Nurse: Bobbye Charleston, RDCS  EF and Strain Measurements   Ejection Fraction  TDI e' lateral 9.68     TDI e' medial 3.92          2D Measurements   LV PW d 8 mm (Range: 0.6 - 1.1)    FS 42 % (Range: 28 - 44)    LA ID, A-P, ES 27 mm    LA diam end sys 27 mm    LA vol 45.8 mL    LA vol index 26 mL/m2    IVS/LV PW RATIO, ED 1.5     FS 42 % (Range: 28 - 44)        Right Ventricle Measurements   Lateral S' vel 13.3 cm/sec    TAPSE 25.6 mm    RV sys press 24 mmHg        Aortic Valve Measurements   Stenosis  LVOT diameter 17 mm    LVOT area 2.27 cm2    LVOT VTI 27.7 cm    LVOT peak vel 105 cm/s  Regurgitation  P 1/2 time 760 ms     PISA/HCM  LVOT peak grad rest 4 mmHg           Left Atrium Measurements   LA ID, A-P, ES 27 mm    LA vol 45.8 mL    LA vol index  26 mL/m2    LA vol A4C 38.5 ml    LVOT SV 63 mL        Mitral Valve Measurements   Regurgitation  MV Dec 292     E decel time 292 msec     PISA-MS/PISA-MR  MV pk E vel 1.1 m/s          Tricuspid Valve Measurements   PISA  TR max vel 227 cm/s             Implants    Catheter  Power [place] [id number] - Implanted  (Right) Subclavian   Inventory item: POWER PORT VUE Model/Cat number: [id number]  Manufacturer: [place] [employer] Implantation: Subclavian  As of 08/18/2016    Status: Implanted      Order-Level Documents - 07/17/2016:   Scan on 07/18/2016 12:07 PM by Default, Provider, MD      Encounter-Level Documents - 07/17/2016:   Document on 07/21/2016 3:24 AM by Jacquenette Shone, RN: ED Encounter Summary  Document on 07/21/2016 3:24 AM by Jacquenette Shone, RN: ED PB Summary  Scan on 07/19/2016 12:16 PM by Default, Provider, MD  Scan on 07/18/2016 11:54 AM by Default, Provider, MD  Document on 07/17/2016 5:46 PM by Suzan Nailer, RN: IP After Visit Summary  Scan on 07/17/2016 9:03 AM by Default, Provider, MD  Document on 07/17/2016 1:37 AM by Orpah Greek, MD: ED PB Summary      Signed   Electronically signed by Lelon Perla, MD on 07/17/16 at 1441 EDT  Printable Result Report   Result Report   External Result Report   External Result Report

## 2018-04-09 NOTE — Patient Instructions (Signed)
Your procedure is scheduled on: 04-15-18 Report to Same Day Surgery 2nd floor medical mall [facility] Entrance-take elevator on left to 2nd floor.  Check in with surgery information desk.) To find out your arrival time please call [phone] between 1PM - 3PM on  04-12-18  Remember: Instructions that are not followed completely may result in serious medical risk, up to and including death, or upon the discretion of your surgeon and anesthesiologist your surgery may need to be rescheduled.    _x___ 1. Do not eat food after midnight the night before your procedure. NO GUM OR CANDY AFTER MIDNIGHT.  You may drink clear liquids up to 2 hours before you are scheduled to arrive at the hospital for your procedure.  Do not drink clear liquids within 2 hours of your scheduled arrival to the hospital.  Clear liquids include  --Water or Apple juice without pulp  --Clear carbohydrate beverage such as ClearFast or Gatorade  --Black Coffee or Clear Tea (No milk, no creamers, do not add anything to the coffee or Tea      __x__ 2. No Alcohol for 24 hours before or after surgery.   __x__3. No Smoking or e-cigarettes for 24 prior to surgery.  Do not use any chewable tobacco products for at least 6 hour prior to surgery   ____  4. Bring all medications with you on the day of surgery if instructed.    __x__ 5. Notify your doctor if there is any change in your medical condition     (cold, fever, infections).    x___6. On the morning of surgery brush your teeth with toothpaste and water.  You may rinse your mouth with mouth wash if you wish.  Do not swallow any toothpaste or mouthwash.   Do not wear jewelry, make-up, hairpins, clips or nail polish.  Do not wear lotions, powders, or perfumes. You may wear deodorant.  Do not shave 48 hours prior to surgery. Men may shave face and neck.  Do not bring valuables to the hospital.    [facility] is not responsible for any belongings or valuables.    Contacts, dentures or bridgework may not be worn into surgery.  Leave your suitcase in the car. After surgery it may be brought to your room.  For patients admitted to the hospital, discharge time is determined by your treatment team.  _  Patients discharged the day of surgery will not be allowed to drive home.  You will need someone to drive you home and stay with you the night of your procedure.     _x___ TAKE THE FOLLOWING MEDICATION THE MORNING OF SURGERY. These include:  1. AMLODIPINE  2. TRAMADOL  3. EFFEXOR  4.  5.  6.  ____Fleets enema or Magnesium Citrate as directed.   ____ Use CHG Soap or sage wipes as directed on instruction sheet   ____ Use inhalers on the day of surgery and bring to hospital day of surgery  ____ Stop Metformin and Janumet 2 days prior to surgery.    ____ Take 1/2 of usual insulin dose the night before surgery and none on the morning surgery.   _x___ Follow recommendations from Cardiologist, Pulmonologist or PCP regarding stopping Aspirin, Coumadin, Plavix ,Eliquis, Effient, or Pradaxa, and Pletal-OK TO CONTINUE 81 MG ASA-DO NOT TAKE AM OF SURGERY  ____Stop Anti-inflammatories such as Advil, Aleve, Ibuprofen, Motrin, Naproxen, Naprosyn, Goodies powders or aspirin products. OK to take Tylenol   ____ Stop supplements until after surgery.  ____ Bring C-Pap to the hospital.

## 2018-04-14 MED ORDER — SODIUM CHLORIDE 0.9 % IV SOLN
1.0000 g | INTRAVENOUS | Status: AC
  Administered 2018-04-15: 1 g via INTRAVENOUS
  Filled 2018-04-14: qty 1

## 2018-04-15 ENCOUNTER — Ambulatory Visit
Admission: RE | Admit: 2018-04-15 | Discharge: 2018-04-15 | Disposition: A | Discharge status: Home / Self Care | Payer: BLUE CROSS/BLUE SHIELD | Source: Ambulatory Visit | Attending: General Surgery | Admitting: General Surgery

## 2018-04-15 ENCOUNTER — Encounter
Admission: RE | Disposition: A | Discharge status: Home / Self Care | Payer: Self-pay | Source: Ambulatory Visit | Attending: General Surgery

## 2018-04-15 ENCOUNTER — Ambulatory Visit: Payer: BLUE CROSS/BLUE SHIELD | Admitting: Anesthesiology

## 2018-04-15 DIAGNOSIS — I2510 Atherosclerotic heart disease of native coronary artery without angina pectoris: Secondary | ICD-10-CM | POA: Diagnosis not present

## 2018-04-15 DIAGNOSIS — Z87440 Personal history of urinary (tract) infections: Secondary | ICD-10-CM | POA: Insufficient documentation

## 2018-04-15 DIAGNOSIS — N739 Female pelvic inflammatory disease, unspecified: Secondary | ICD-10-CM

## 2018-04-15 DIAGNOSIS — I739 Peripheral vascular disease, unspecified: Secondary | ICD-10-CM | POA: Insufficient documentation

## 2018-04-15 DIAGNOSIS — K6811 Postprocedural retroperitoneal abscess: Secondary | ICD-10-CM | POA: Insufficient documentation

## 2018-04-15 DIAGNOSIS — Z79899 Other long term (current) drug therapy: Secondary | ICD-10-CM | POA: Diagnosis not present

## 2018-04-15 DIAGNOSIS — I10 Essential (primary) hypertension: Secondary | ICD-10-CM | POA: Diagnosis not present

## 2018-04-15 DIAGNOSIS — K611 Rectal abscess: Secondary | ICD-10-CM | POA: Diagnosis not present

## 2018-04-15 HISTORY — PX: INCISION AND DRAINAGE PERIRECTAL ABSCESS: SHX1804

## 2018-04-15 SURGERY — INCISION AND DRAINAGE, ABSCESS, PERIRECTAL
Anesthesia: General | Wound class: Clean Contaminated

## 2018-04-15 MED ORDER — DEXAMETHASONE SODIUM PHOSPHATE 10 MG/ML IJ SOLN
INTRAMUSCULAR | Status: DC | PRN
  Administered 2018-04-15: 10 mg via INTRAVENOUS

## 2018-04-15 MED ORDER — ACETAMINOPHEN 10 MG/ML IV SOLN
INTRAVENOUS | Status: AC
  Filled 2018-04-15: qty 100

## 2018-04-15 MED ORDER — FENTANYL CITRATE (PF) 100 MCG/2ML IJ SOLN
INTRAMUSCULAR | Status: AC
  Filled 2018-04-15: qty 2

## 2018-04-15 MED ORDER — PHENYLEPHRINE HCL 10 MG/ML IJ SOLN
INTRAMUSCULAR | Status: DC | PRN
Start: 2018-04-15 — End: 2018-04-15
  Administered 2018-04-15: 100 ug via INTRAVENOUS

## 2018-04-15 MED ORDER — LIDOCAINE HCL (PF) 2 % IJ SOLN
INTRAMUSCULAR | Status: AC
  Filled 2018-04-15: qty 10

## 2018-04-15 MED ORDER — SEVOFLURANE IN SOLN
RESPIRATORY_TRACT | Status: AC
  Filled 2018-04-15: qty 250

## 2018-04-15 MED ORDER — KETOROLAC TROMETHAMINE 30 MG/ML IJ SOLN
INTRAMUSCULAR | Status: DC | PRN
Start: 2018-04-15 — End: 2018-04-15
  Administered 2018-04-15: 30 mg via INTRAVENOUS

## 2018-04-15 MED ORDER — HEPARIN SOD (PORK) LOCK FLUSH 100 UNIT/ML IV SOLN
INTRAVENOUS | Status: AC
  Filled 2018-04-15: qty 5

## 2018-04-15 MED ORDER — FENTANYL CITRATE (PF) 100 MCG/2ML IJ SOLN
INTRAMUSCULAR | Status: DC | PRN
  Administered 2018-04-15 (×2): 50 ug via INTRAVENOUS

## 2018-04-15 MED ORDER — EPHEDRINE SULFATE 50 MG/ML IJ SOLN
INTRAMUSCULAR | Status: DC | PRN
  Administered 2018-04-15: 10 mg via INTRAVENOUS

## 2018-04-15 MED ORDER — MEPERIDINE HCL 50 MG/ML IJ SOLN: 6.2500 mg | INTRAMUSCULAR | Status: DC | PRN

## 2018-04-15 MED ORDER — PROPOFOL 10 MG/ML IV BOLUS
INTRAVENOUS | Status: AC
  Filled 2018-04-15: qty 20

## 2018-04-15 MED ORDER — LACTATED RINGERS IV SOLN
INTRAVENOUS | Status: DC
  Administered 2018-04-15: 13:00:00 via INTRAVENOUS

## 2018-04-15 MED ORDER — FAMOTIDINE 20 MG PO TABS
20.0000 mg | ORAL_TABLET | Freq: Once | ORAL | Status: AC
  Administered 2018-04-15: 20 mg via ORAL

## 2018-04-15 MED ORDER — PROPOFOL 10 MG/ML IV BOLUS
INTRAVENOUS | Status: DC | PRN
  Administered 2018-04-15: 160 mg via INTRAVENOUS

## 2018-04-15 MED ORDER — ACETAMINOPHEN 160 MG/5ML PO SOLN
325.0000 mg | ORAL | Status: DC | PRN
  Filled 2018-04-15: qty 20.3

## 2018-04-15 MED ORDER — BUPIVACAINE-EPINEPHRINE (PF) 0.5% -1:200000 IJ SOLN
INTRAMUSCULAR | Status: DC | PRN
  Administered 2018-04-15: 30 mL via PERINEURAL

## 2018-04-15 MED ORDER — MIDAZOLAM HCL 2 MG/2ML IJ SOLN
INTRAMUSCULAR | Status: DC | PRN
  Administered 2018-04-15: 2 mg via INTRAVENOUS

## 2018-04-15 MED ORDER — LACTATED RINGERS IV SOLN
INTRAVENOUS | Status: DC | PRN
  Administered 2018-04-15: 14:00:00 via INTRAVENOUS

## 2018-04-15 MED ORDER — ACETAMINOPHEN 10 MG/ML IV SOLN
INTRAVENOUS | Status: DC | PRN
  Administered 2018-04-15: 1000 mg via INTRAVENOUS

## 2018-04-15 MED ORDER — LIDOCAINE HCL (CARDIAC) PF 100 MG/5ML IV SOSY
PREFILLED_SYRINGE | INTRAVENOUS | Status: DC | PRN
  Administered 2018-04-15: 100 mg via INTRAVENOUS

## 2018-04-15 MED ORDER — FAMOTIDINE 20 MG PO TABS
ORAL_TABLET | ORAL | Status: AC
  Administered 2018-04-15: 20 mg via ORAL
  Filled 2018-04-15: qty 1

## 2018-04-15 MED ORDER — HYDROMORPHONE HCL 1 MG/ML IJ SOLN: 0.2500 mg | INTRAMUSCULAR | Status: DC | PRN

## 2018-04-15 MED ORDER — ACETAMINOPHEN 325 MG PO TABS: 325.0000 mg | ORAL_TABLET | ORAL | Status: DC | PRN

## 2018-04-15 MED ORDER — SODIUM CHLORIDE FLUSH 0.9 % IV SOLN
INTRAVENOUS | Status: AC
  Filled 2018-04-15: qty 10

## 2018-04-15 MED ORDER — HYDROCODONE-ACETAMINOPHEN 5-325 MG PO TABS: 1.0000 | ORAL_TABLET | ORAL | 0 refills | Status: DC | PRN

## 2018-04-15 MED ORDER — PROMETHAZINE HCL 25 MG/ML IJ SOLN: 6.2500 mg | INTRAMUSCULAR | Status: DC | PRN

## 2018-04-15 MED ORDER — HYDROCODONE-ACETAMINOPHEN 7.5-325 MG PO TABS
1.0000 | ORAL_TABLET | Freq: Once | ORAL | Status: DC | PRN
  Filled 2018-04-15: qty 1

## 2018-04-15 MED ORDER — BUPIVACAINE-EPINEPHRINE (PF) 0.5% -1:200000 IJ SOLN
INTRAMUSCULAR | Status: AC
  Filled 2018-04-15: qty 30

## 2018-04-15 MED ORDER — ONDANSETRON HCL 4 MG/2ML IJ SOLN
INTRAMUSCULAR | Status: DC | PRN
  Administered 2018-04-15: 4 mg via INTRAVENOUS

## 2018-04-15 MED ORDER — MIDAZOLAM HCL 2 MG/2ML IJ SOLN
INTRAMUSCULAR | Status: AC
Start: 2018-04-15 — End: 2018-04-15
  Filled 2018-04-15: qty 2

## 2018-04-15 SURGICAL SUPPLY — 31 items
BLADE SURG 11 STRL SS SAFETY (MISCELLANEOUS) ×3 IMPLANT
BRIEF STRETCH MATERNITY 2XLG (MISCELLANEOUS) ×3 IMPLANT
CANISTER SUCT 1200ML W/VALVE (MISCELLANEOUS) ×3 IMPLANT
DRAIN PENROSE 5/8X18 LTX STRL (WOUND CARE) ×2 IMPLANT
DRAPE LAPAROTOMY 100X77 ABD (DRAPES) ×3 IMPLANT
DRAPE LEGGINS SURG 28X43 STRL (DRAPES) ×3 IMPLANT
DRAPE UNDER BUTTOCK W/FLU (DRAPES) ×3 IMPLANT
ELECT REM PT RETURN 9FT ADLT (ELECTROSURGICAL) ×3
ELECTRODE REM PT RTRN 9FT ADLT (ELECTROSURGICAL) ×1 IMPLANT
GLOVE BIO SURGEON STRL SZ7.5 (GLOVE) ×3 IMPLANT
GLOVE INDICATOR 8.0 STRL GRN (GLOVE) ×3 IMPLANT
GOWN STRL REUS W/ TWL LRG LVL3 (GOWN DISPOSABLE) ×2 IMPLANT
GOWN STRL REUS W/TWL LRG LVL3 (GOWN DISPOSABLE) ×6
KIT TURNOVER KIT A (KITS) ×3 IMPLANT
LABEL OR SOLS (LABEL) ×3 IMPLANT
NDL HYPO 25X1 1.5 SAFETY (NEEDLE) IMPLANT
NEEDLE HYPO 25X1 1.5 SAFETY (NEEDLE) IMPLANT
NS IRRIG 500ML POUR BTL (IV SOLUTION) ×3 IMPLANT
PACK BASIN MINOR ARMC (MISCELLANEOUS) ×3 IMPLANT
PAD OB MATERNITY 4.3X12.25 (PERSONAL CARE ITEMS) ×3 IMPLANT
PAD PREP 24X41 OB/GYN DISP (PERSONAL CARE ITEMS) ×3 IMPLANT
PIN SAFETY STRL (MISCELLANEOUS) ×2 IMPLANT
SOL PREP PVP 2OZ (MISCELLANEOUS) ×3
SOLUTION PREP PVP 2OZ (MISCELLANEOUS) ×1 IMPLANT
SURGILUBE 2OZ TUBE FLIPTOP (MISCELLANEOUS) ×3 IMPLANT
SUT SILK 0 SH 30 (SUTURE) ×3 IMPLANT
SUT VIC AB 3-0 SH 27 (SUTURE) ×3
SUT VIC AB 3-0 SH 27X BRD (SUTURE) ×1 IMPLANT
SWAB CULTURE AMIES ANAERIB BLU (MISCELLANEOUS) IMPLANT
SYR BULB IRRIG 60ML STRL (SYRINGE) ×3 IMPLANT
SYR CONTROL 10ML (SYRINGE) IMPLANT

## 2018-04-15 NOTE — Transfer of Care (Signed)
Immediate Anesthesia Transfer of Care Note  Patient: Rachel [name]  Procedure(s) Performed: RECTAL ABSCESS (N/A )  Patient Location: PACU  Anesthesia Type:General  Level of Consciousness: sedated  Airway & Oxygen Therapy: Patient Spontanous Breathing and Patient connected to face mask oxygen  Post-op Assessment: Report given to RN and Post -op Vital signs reviewed and stable  Post vital signs: Reviewed and stable  Last Vitals:  Vitals Value Taken Time  BP    Temp    Pulse    Resp    SpO2      Last Pain:  Vitals:   04/15/18 1234  TempSrc: Tympanic         Complications: No apparent anesthesia complications

## 2018-04-15 NOTE — H&P (Signed)
No change in clinical condition or exam. For exam under anesthesia, drainage of presacral fluid collection.

## 2018-04-15 NOTE — Anesthesia Post-op Follow-up Note (Signed)
Anesthesia QCDR form completed.        

## 2018-04-15 NOTE — Anesthesia Procedure Notes (Signed)
Procedure Name: LMA Insertion Date/Time: 04/15/2018 2:10 PM Performed by: Justus Memory, CRNA Pre-anesthesia Checklist: Patient identified, Patient being monitored, Timeout performed, Emergency Drugs available and Suction available Patient Re-evaluated:Patient Re-evaluated prior to induction Oxygen Delivery Method: Circle system utilized Preoxygenation: Pre-oxygenation with 100% oxygen Induction Type: IV induction Ventilation: Mask ventilation without difficulty LMA: LMA inserted LMA Size: 4.5 Tube type: Oral Number of attempts: 1 Placement Confirmation: positive ETCO2 and breath sounds checked- equal and bilateral Tube secured with: Tape Dental Injury: Teeth and Oropharynx as per pre-operative assessment

## 2018-04-15 NOTE — Anesthesia Preprocedure Evaluation (Signed)
Anesthesia Evaluation  Patient identified by MRN, date of birth, ID band Patient awake    Reviewed: Allergy & Precautions, H&P , NPO status , reviewed documented beta blocker date and time   Airway Mallampati: II  TM Distance: >3 FB Neck ROM: full    Dental  (+) Upper Dentures, Lower Dentures   Pulmonary former smoker,    Pulmonary exam normal        Cardiovascular hypertension, + angina + CAD and + Peripheral Vascular Disease  Normal cardiovascular exam  2017 ECHO Study Conclusions  - Left ventricle: The cavity size was normal. Wall thickness was   increased in a pattern of mild LVH. Systolic function was normal.   The estimated ejection fraction was in the range of 50% to 55%.   Wall motion was normal; there were no regional wall motion   abnormalities. Doppler parameters are consistent with abnormal   left ventricular relaxation (grade 1 diastolic dysfunction).   Doppler parameters are consistent with high ventricular filling   pressure. - Aortic valve: There was trivial regurgitation.  Impressions:  - Low normal LV systolic function; grade 1 diastolic dysfunction   with elevated LV filling pressure; trace AI and MR; mild TR   Neuro/Psych  Headaches,  Neuromuscular disease CVA    GI/Hepatic   Endo/Other    Renal/GU      Musculoskeletal   Abdominal   Peds  Hematology   Anesthesia Other Findings Past Medical History: No date: Anginal pain ([place]) No date: Arthritis No date: Body mass index (BMI) of 24.0-24.9 in adult No date: Cardiovascular disease No date: Current every day smoker     Comment:  a. ~35 years - > has cut down to < 3/4 ppd No date: Headache No date: History of UTI No date: Hypertension No date: Midsternal chest pain     Comment:  a. 05/2014 Stress Echo: Ex time: 8:09, ECG w/o acute               changes, EF nl with possible mid and apical anterior               HK-->cath  recommended. No date: Port-A-Cath in place     Comment:  RIGHT SIDE 2017: Rectal cancer ([place]) No date: Stroke [facility])     Comment:  07/2016 tia no neurology followup now seen by pcp No date: Syncope and collapse 2018: Thyroid nodule     Comment:  FINDINGS CONSISTENT WITH A HURTHLE CELL LESION AND/OR               NEOPLASM (BETHESDA CATEGORY IV).  Past Surgical History: No date: ABDOMINAL HYSTERECTOMY 03/16/2017: ABDOMINAL PERINEAL BOWEL RESECTION; N/A     Comment:  Procedure: ABDOMINAL PERINEAL RESECTION;  Surgeon:               Christene Lye, MD;  Location: [facility] ORS;  Service:              General;  Laterality: N/A; 03/16/2017: APPENDECTOMY     Comment:  Procedure: APPENDECTOMY;  Surgeon: Christene Lye, MD;  Location: [facility] ORS;  Service: General;; No date: BACK SURGERY     Comment:  l 3,4,5 11/16/2016: BIOPSY THYROID; Right     Comment:  FINDINGS CONSISTENT WITH A HURTHLE CELL LESION AND/OR               NEOPLASM (BETHESDA CATEGORY IV). No date: CHOLECYSTECTOMY  08/15/2016: COLONOSCOPY WITH PROPOFOL; N/A     Comment:  Procedure: COLONOSCOPY WITH PROPOFOL;  Surgeon: Lollie Sails, MD;  Location: [facility] ENDOSCOPY;  Service:               Endoscopy;  Laterality: N/A; 11/08/2016: COLONOSCOPY WITH PROPOFOL; N/A     Comment:  Procedure: COLONOSCOPY WITH PROPOFOL;  Surgeon:               Christene Lye, MD;  Location: [facility] ENDOSCOPY;                Service: Endoscopy;  Laterality: N/A; 11/23/2016: COLOSTOMY; N/A     Comment:  Procedure: SIGMOID COLOSTOMY;  Surgeon: Christene Lye, MD;  Location: [facility] ORS;  Service: General;                Laterality: N/A; No date: ERCP 11/23/2016: LAPAROSCOPY; N/A     Comment:  Procedure: LAPAROSCOPY DIAGNOSTIC;  Surgeon:               Christene Lye, MD;  Location: [facility] ORS;  Service:              General;  Laterality: N/A; No date: PARTIAL HYSTERECTOMY 08/18/2016: PORTACATH  PLACEMENT; N/A     Comment:  Procedure: INSERTION PORT-A-CATH;  Surgeon: Christene Lye, MD;  Location: [facility] ORS;  Service: General;                Laterality: N/A; 11/05/2017: THYROID LOBECTOMY; Right     Comment:  Procedure: THYROID LOBECTOMY;  Surgeon: Christene Lye, MD;  Location: [facility] ORS;  Service:               General;  Laterality: Right;     Reproductive/Obstetrics                             Anesthesia Physical Anesthesia Plan  ASA: III  Anesthesia Plan: General   Post-op Pain Management:    Induction:   PONV Risk Score and Plan: 2 and Ondansetron, Midazolam and Metaclopromide  Airway Management Planned:   Additional Equipment:   Intra-op Plan:   Post-operative Plan:   Informed Consent: I have reviewed the patients History and Physical, chart, labs and discussed the procedure including the risks, benefits and alternatives for the proposed anesthesia with the patient or authorized representative who has indicated his/her understanding and acceptance.   Dental Advisory Given  Plan Discussed with: CRNA  Anesthesia Plan Comments:         Anesthesia Quick Evaluation

## 2018-04-15 NOTE — Op Note (Signed)
Preoperative diagnosis: Retroperitoneal fluid collection post abdominal perineal resection.  Postoperative diagnosis: Same.  Operative procedure: Drainage of retroperitoneal fluid collection via perineal route.  Operating Surgeon: Hervey Ard, MD.  Anesthesia: General by LMA, Marcaine 0.5% with 1 to 200,000 units of epinephrine, 30 cc.  Estimated blood loss: Less than 10 cc.  Clinical note: This 62 year old woman who previously undergone abdominal perineal resection for rectal cancer after neoadjuvant chemoradiation.  She has had persistent drainage from the posterior midline wound and CT scan completed last month suggested a fluid collection posterior to the vagina, anterior to the sacrum.  She was felt to be a candidate for exploration.  The patient received Invanz prior to the procedure.  Operative note: The patient underwent general anesthesia without difficulty.  She was placed in dorsal lithotomy position with the buttocks taped apart.  The area was cleansed with Betadine solution x2 and draped.  The 3 midline "pits" all were contiguous with a small cavity with chronic inflammatory tissue posterior to the vagina.  The 2 most posterior "pits" were made into one larger site in the anterior "pit" was expanded to allow adequate curetting and debridement of the area.  This appeared to represent a blind pocket approximately 8-9 cm in depth going just above the apex of the vaginal cuff and perhaps 2-3 cm in diameter.  Through the more posterior aspect a 1 inch Penrose drain was placed in the most superior aspect of the cavity and a 3-0 nylon suture was placed on either side of the midline and a safety pin used to anchor the drain in place.    A.  Pad and plastic underwear was applied.  The patient tolerated the procedure well and was taken to recovery room in stable condition.

## 2018-04-15 NOTE — Discharge Instructions (Signed)
AMBULATORY SURGERY  °DISCHARGE INSTRUCTIONS ° ° °1) The drugs that you were given will stay in your system until tomorrow so for the next 24 hours you should not: ° °A) Drive an automobile °B) Make any legal decisions °C) Drink any alcoholic beverage ° ° °2) You may resume regular meals tomorrow.  Today it is better to start with liquids and gradually work up to solid foods. ° °You may eat anything you prefer, but it is better to start with liquids, then soup and crackers, and gradually work up to solid foods. ° ° °3) Please notify your doctor immediately if you have any unusual bleeding, trouble breathing, redness and pain at the surgery site, drainage, fever, or pain not relieved by medication. ° ° ° °4) Additional Instructions: ° ° ° ° ° ° ° °Please contact your physician with any problems or Same Day Surgery at [phone], Monday through Friday 6 am to 4 pm, or [place] [facility] [place] Main number at [phone]. °

## 2018-04-16 ENCOUNTER — Encounter: Payer: Self-pay | Admitting: General Surgery

## 2018-04-17 ENCOUNTER — Telehealth: Payer: Self-pay | Admitting: *Deleted

## 2018-04-17 LAB — SURGICAL PATHOLOGY

## 2018-04-17 NOTE — Anesthesia Postprocedure Evaluation (Signed)
Anesthesia Post Note  Patient: Rachel [name]  Procedure(s) Performed: RECTAL ABSCESS (N/A )  Patient location during evaluation: PACU Anesthesia Type: General Level of consciousness: awake and alert Pain management: pain level controlled Vital Signs Assessment: post-procedure vital signs reviewed and stable Respiratory status: spontaneous breathing, nonlabored ventilation and respiratory function stable Cardiovascular status: blood pressure returned to baseline and stable Postop Assessment: no apparent nausea or vomiting Anesthetic complications: no     Last Vitals:  Vitals:   04/15/18 1534 04/15/18 1604  BP: 132/80 133/76  Pulse: 64 64  Resp: 16 16  Temp: 36.4 C   SpO2: 96% 98%    Last Pain:  Vitals:   04/16/18 0829  TempSrc:   PainSc: 0-No pain                 Alphonsus Sias

## 2018-04-17 NOTE — Telephone Encounter (Signed)
Notified patient as instructed, patient pleased. Discussed follow-up appointments, patient agrees  

## 2018-04-17 NOTE — Telephone Encounter (Signed)
-----   Message from Robert Bellow, MD sent at 04/17/2018 12:08 PM EDT ----- Please notify the patient that there is no evidence of recurrent cancer, just old inflammation.  Follow-up as scheduled. ----- Message ----- From: Buel Ream, [facility] [place] Sent: 04/15/2018  11:27 PM To: Robert Bellow, MD

## 2018-04-20 LAB — AEROBIC/ANAEROBIC CULTURE W GRAM STAIN (SURGICAL/DEEP WOUND)

## 2018-04-20 LAB — AEROBIC/ANAEROBIC CULTURE (SURGICAL/DEEP WOUND)

## 2018-04-22 ENCOUNTER — Ambulatory Visit (INDEPENDENT_AMBULATORY_CARE_PROVIDER_SITE_OTHER): Payer: BLUE CROSS/BLUE SHIELD | Admitting: General Surgery

## 2018-04-22 ENCOUNTER — Encounter: Payer: Self-pay | Admitting: General Surgery

## 2018-04-22 VITALS — BP 130/74 | HR 76 | Resp 14 | Ht 68.0 in | Wt 163.0 lb

## 2018-04-22 DIAGNOSIS — C20 Malignant neoplasm of rectum: Secondary | ICD-10-CM

## 2018-04-22 NOTE — Progress Notes (Signed)
Patient ID: Rachel [name], female   DOB: 1956-03-15, 63 y.o.   MRN: [id number]  Chief Complaint  Patient presents with  . Routine Post Op    HPI Rachel [name] is a 62 y.o. female here today for her post op rectal mass excision done on 04/15/2018. She states when she seats the area is still very sore.  HPI  Past Medical History:  Diagnosis Date  . Anginal pain ([place])   . Arthritis   . Body mass index (BMI) of 24.0-24.9 in adult   . Cardiovascular disease   . Current every day smoker    a. ~35 years - > has cut down to < 3/4 ppd  . Headache   . History of UTI   . Hypertension   . Midsternal chest pain    a. 05/2014 Stress Echo: Ex time: 8:09, ECG w/o acute changes, EF nl with possible mid and apical anterior HK-->cath recommended.  . Port-A-Cath in place    RIGHT SIDE  . Rectal cancer ([place]) 2017  . Stroke ([place])    07/2016 tia no neurology followup now seen by pcp  . Syncope and collapse   . Thyroid nodule 2018   FINDINGS CONSISTENT WITH A HURTHLE CELL LESION AND/OR NEOPLASM (BETHESDA CATEGORY IV).    Past Surgical History:  Procedure Laterality Date  . ABDOMINAL HYSTERECTOMY    . ABDOMINAL PERINEAL BOWEL RESECTION N/A 03/16/2017   Procedure: ABDOMINAL PERINEAL RESECTION;  Surgeon: Christene Lye, MD;  Location: [facility] ORS;  Service: General;  Laterality: N/A;  . APPENDECTOMY  03/16/2017   Procedure: APPENDECTOMY;  Surgeon: Christene Lye, MD;  Location: [facility] ORS;  Service: General;;  . BACK SURGERY     l 3,4,5  . BIOPSY THYROID Right 11/16/2016   FINDINGS CONSISTENT WITH A HURTHLE CELL LESION AND/OR NEOPLASM (BETHESDA CATEGORY IV).  [name] CHOLECYSTECTOMY    . COLONOSCOPY WITH PROPOFOL N/A 08/15/2016   Procedure: COLONOSCOPY WITH PROPOFOL;  Surgeon: Lollie Sails, MD;  Location: [facility] ENDOSCOPY;  Service: Endoscopy;  Laterality: N/A;  . COLONOSCOPY WITH PROPOFOL N/A 11/08/2016   Procedure: COLONOSCOPY WITH PROPOFOL;  Surgeon: Christene Lye, MD;  Location: [facility]  ENDOSCOPY;  Service: Endoscopy;  Laterality: N/A;  . COLOSTOMY N/A 11/23/2016   Procedure: SIGMOID COLOSTOMY;  Surgeon: Christene Lye, MD;  Location: [facility] ORS;  Service: General;  Laterality: N/A;  . ERCP    . INCISION AND DRAINAGE PERIRECTAL ABSCESS N/A 04/15/2018   Procedure: RECTAL ABSCESS;  Surgeon: Robert Bellow, MD;  Location: [facility] ORS;  Service: General;  Laterality: N/A;  . LAPAROSCOPY N/A 11/23/2016   Procedure: LAPAROSCOPY DIAGNOSTIC;  Surgeon: Christene Lye, MD;  Location: [facility] ORS;  Service: General;  Laterality: N/A;  . PARTIAL HYSTERECTOMY    . PORTACATH PLACEMENT N/A 08/18/2016   Procedure: INSERTION PORT-A-CATH;  Surgeon: Christene Lye, MD;  Location: [facility] ORS;  Service: General;  Laterality: N/A;  . THYROID LOBECTOMY Right 11/05/2017   Procedure: THYROID LOBECTOMY;  Surgeon: Christene Lye, MD;  Location: [facility] ORS;  Service: General;  Laterality: Right;    Family History  Problem Relation Age of Onset  . Breast cancer Mother   . Hyperlipidemia Father   . Heart disease Paternal Grandmother   . Stroke Maternal Grandfather     Social History Social History   Tobacco Use  . Smoking status: Former Smoker    Packs/day: 0.25    Years: 35.00    Pack years: 8.75    Types:  Cigarettes    Last attempt to quit: 08/11/2016    Years since quitting: 1.6  . Smokeless tobacco: Never Used  Substance Use Topics  . Alcohol use: No  . Drug use: No    Allergies  Allergen Reactions  . Prednisone Other (See Comments)    BLISTERS IN MOUTH AND ON TONGUE    Current Outpatient Medications  Medication Sig Dispense Refill  . amLODipine (NORVASC) 10 MG tablet Take 10 mg by mouth every morning.     [name] aspirin EC 81 MG tablet Take 81 mg by mouth daily.    [name] docusate sodium (COLACE) 100 MG capsule Take 100 mg by mouth daily as needed.     [name] HYDROcodone-acetaminophen (NORCO/VICODIN) 5-325 MG tablet Take 1 tablet by mouth every 4 (four) hours as needed for  moderate pain. 20 tablet 0  . lidocaine-prilocaine (EMLA) cream Apply 1 application topically as needed (for port access).    . nitroGLYCERIN (NITROSTAT) 0.4 MG SL tablet Place 1 tablet (0.4 mg total) under the tongue every 5 (five) minutes as needed for chest pain. 25 tablet 6  . traMADol (ULTRAM) 50 MG tablet Take 1 tablet (50 mg total) by mouth every 6 (six) hours as needed (for pain.). (Patient taking differently: Take 100 mg by mouth 2 (two) times daily. ) 30 tablet 0  . venlafaxine XR (EFFEXOR-XR) 75 MG 24 hr capsule Take 75 mg by mouth daily with breakfast.     No current facility-administered medications for this visit.     Review of Systems Review of Systems  Constitutional: Negative.   Respiratory: Negative.   Cardiovascular: Negative.     Blood pressure 130/74, pulse 76, resp. rate 14, height 5\' 8"  (1.727 m), weight 163 lb (73.9 kg).  Physical Exam Physical Exam  Constitutional: She is oriented to person, place, and time. She appears well-developed and well-nourished.  Neurological: She is alert and oriented to person, place, and time.  Skin: Skin is warm and dry.  Perineal wound is clean.  No odor.  Drain shortened by 1 inch.  Data Reviewed DIAGNOSIS:  A. SOFT TISSUE, RETROPERITONEUM POSTERIOR TO VAGINA; ABSCESS DRAINAGE:  - GRANULATION TISSUE WITH ACUTE INFLAMMATION AND SMALL POCKETS OF  SUPPURATIVE INFLAMMATION, ASSOCIATED WITH SCATTERED HAIR FRAGMENTS.  - TWO SMALL FRAGMENTS LINED BY INFLAMED SQUAMOUS EPITHELIUM.  - NEGATIVE FOR MALIGNANCY.  ABUNDANT WBC PRESENT,BOTH PMN AND MONONUCLEAR  RARE GRAM POSITIVE COCCI  Performed at [facility], [place] [address] [place], [place] [address]      Culture RARE DIPHTHEROIDS(CORYNEBACTERIUM SPECIES)  Standardized susceptibility testing for this organism is not available.  MIXED ANAEROBIC FLORA PRESENT.      Assessment    Doing well post debridement of retroperitoneal abscess.  No clinical evidence of significant  infection.      Plan  Patient to return in three days.  The patient is aware to call back for any questions or concerns.   HPI, Physical Exam, Assessment and Plan have been scribed under the direction and in the presence of Hervey Ard, MD.  [name], CMA  I have completed the exam and reviewed the above documentation for accuracy and completeness.  I agree with the above.  Haematologist has been used and any errors in dictation or transcription are unintentional.  Hervey Ard, M.D., F.A.C.S.  Forest Gleason Harkirat Orozco 04/22/2018, 9:34 AM

## 2018-04-22 NOTE — Patient Instructions (Signed)
Patient to return in three days.  The patient is aware to call back for any questions or concerns.

## 2018-04-25 ENCOUNTER — Encounter: Payer: Self-pay | Admitting: General Surgery

## 2018-04-25 ENCOUNTER — Ambulatory Visit (INDEPENDENT_AMBULATORY_CARE_PROVIDER_SITE_OTHER): Payer: BLUE CROSS/BLUE SHIELD | Admitting: General Surgery

## 2018-04-25 VITALS — BP 138/86 | HR 78 | Resp 14 | Ht 68.0 in | Wt 164.4 lb

## 2018-04-25 DIAGNOSIS — L02215 Cutaneous abscess of perineum: Secondary | ICD-10-CM

## 2018-04-25 NOTE — Patient Instructions (Addendum)
Use cream to area Continue to do warm soaks Follow up Tuesday

## 2018-04-25 NOTE — Progress Notes (Signed)
Patient ID: Rachel [name], female   DOB: August 06, 1956, 62 y.o.   MRN: [id number]  Chief Complaint  Patient presents with  . Follow-up    HPI Rachel [name] is a 62 y.o. female here for follow up perineal abscess excision done on 04/15/18. She reports that the areas is sore and is having dark red drainage. She thinks she may have over done it yesterday with activity. She reports pain with sitting and walking. She has been using Tramadol for pain relief. Her drain is in place.   HPI  Past Medical History:  Diagnosis Date  . Anginal pain ([place])   . Arthritis   . Body mass index (BMI) of 24.0-24.9 in adult   . Cardiovascular disease   . Current every day smoker    a. ~35 years - > has cut down to < 3/4 ppd  . Headache   . History of UTI   . Hypertension   . Midsternal chest pain    a. 05/2014 Stress Echo: Ex time: 8:09, ECG w/o acute changes, EF nl with possible mid and apical anterior HK-->cath recommended.  . Port-A-Cath in place    RIGHT SIDE  . Rectal cancer ([place]) 2017  . Stroke ([place])    07/2016 tia no neurology followup now seen by pcp  . Syncope and collapse   . Thyroid nodule 2018   FINDINGS CONSISTENT WITH A HURTHLE CELL LESION AND/OR NEOPLASM (BETHESDA CATEGORY IV).    Past Surgical History:  Procedure Laterality Date  . ABDOMINAL HYSTERECTOMY    . ABDOMINAL PERINEAL BOWEL RESECTION N/A 03/16/2017   Procedure: ABDOMINAL PERINEAL RESECTION;  Surgeon: Christene Lye, MD;  Location: [facility] ORS;  Service: General;  Laterality: N/A;  . APPENDECTOMY  03/16/2017   Procedure: APPENDECTOMY;  Surgeon: Christene Lye, MD;  Location: [facility] ORS;  Service: General;;  . BACK SURGERY     l 3,4,5  . BIOPSY THYROID Right 11/16/2016   FINDINGS CONSISTENT WITH A HURTHLE CELL LESION AND/OR NEOPLASM (BETHESDA CATEGORY IV).  [name] CHOLECYSTECTOMY    . COLONOSCOPY WITH PROPOFOL N/A 08/15/2016   Procedure: COLONOSCOPY WITH PROPOFOL;  Surgeon: Lollie Sails, MD;  Location[facility] ENDOSCOPY;   Service: Endoscopy;  Laterality: N/A;  . COLONOSCOPY WITH PROPOFOL N/A 11/08/2016   Procedure: COLONOSCOPY WITH PROPOFOL;  Surgeon: Christene Lye, MD;  Location: [facility] ENDOSCOPY;  Service: Endoscopy;  Laterality: N/A;  . COLOSTOMY N/A 11/23/2016   Procedure: SIGMOID COLOSTOMY;  Surgeon: Christene Lye, MD;  Location: [facility] ORS;  Service: General;  Laterality: N/A;  . ERCP    . INCISION AND DRAINAGE PERIRECTAL ABSCESS N/A 04/15/2018   Procedure: RECTAL ABSCESS;  Surgeon: Robert Bellow, MD;  Location: [facility] ORS;  Service: General;  Laterality: N/A;  . LAPAROSCOPY N/A 11/23/2016   Procedure: LAPAROSCOPY DIAGNOSTIC;  Surgeon: Christene Lye, MD;  Location: [facility] ORS;  Service: General;  Laterality: N/A;  . PARTIAL HYSTERECTOMY    . PORTACATH PLACEMENT N/A 08/18/2016   Procedure: INSERTION PORT-A-CATH;  Surgeon: Christene Lye, MD;  Location: [facility] ORS;  Service: General;  Laterality: N/A;  . THYROID LOBECTOMY Right 11/05/2017   Procedure: THYROID LOBECTOMY;  Surgeon: Christene Lye, MD;  Location: [facility] ORS;  Service: General;  Laterality: Right;    Family History  Problem Relation Age of Onset  . Breast cancer Mother   . Hyperlipidemia Father   . Heart disease Paternal Grandmother   . Stroke Maternal Grandfather     Social History Social History  Tobacco Use  . Smoking status: Former Smoker    Packs/day: 0.25    Years: 35.00    Pack years: 8.75    Types: Cigarettes    Last attempt to quit: 08/11/2016    Years since quitting: 1.7  . Smokeless tobacco: Never Used  Substance Use Topics  . Alcohol use: No  . Drug use: No    Allergies  Allergen Reactions  . Prednisone Other (See Comments)    BLISTERS IN MOUTH AND ON TONGUE    Current Outpatient Medications  Medication Sig Dispense Refill  . amLODipine (NORVASC) 10 MG tablet Take 10 mg by mouth every morning.     [name] aspirin EC 81 MG tablet Take 81 mg by mouth daily.    [name] docusate sodium  (COLACE) 100 MG capsule Take 100 mg by mouth daily as needed.     . lidocaine-prilocaine (EMLA) cream Apply 1 application topically as needed (for port access).    . nitroGLYCERIN (NITROSTAT) 0.4 MG SL tablet Place 1 tablet (0.4 mg total) under the tongue every 5 (five) minutes as needed for chest pain. 25 tablet 6  . traMADol (ULTRAM) 50 MG tablet Take 1 tablet (50 mg total) by mouth every 6 (six) hours as needed (for pain.). (Patient taking differently: Take 100 mg by mouth 2 (two) times daily. ) 30 tablet 0  . venlafaxine XR (EFFEXOR-XR) 75 MG 24 hr capsule Take 75 mg by mouth daily with breakfast.     No current facility-administered medications for this visit.     Review of Systems Review of Systems  Constitutional: Negative.   Respiratory: Negative.   Cardiovascular: Negative.     Blood pressure 138/86, pulse 78, resp. rate 14, height 5\' 8"  (1.727 m), weight 164 lb 6.4 oz (74.6 kg).  Physical Exam Physical Exam  Constitutional: She is oriented to person, place, and time. She appears well-developed and well-nourished.  Genitourinary:     Neurological: She is oriented to person, place, and time.  Skin: Skin is warm and dry.  Psychiatric: She has a normal mood and affect.    Data Reviewed Scant growth diphtheroids, Corynebacterium.  Assessment    Doing well post drainage of a pelvic abscess related to her APR.    Plan    Follow up in 5 days.  Patient will make use of a trial of a and D or Desitin ointment to the perineum for comfort.    HPI, Physical Exam, Assessment and Plan have been scribed under the direction and in the presence of Robert Bellow, MD  Concepcion Living, LPN    Rachel [name] 04/25/2018, 10:25 AM

## 2018-04-30 ENCOUNTER — Encounter: Payer: Self-pay | Admitting: General Surgery

## 2018-04-30 ENCOUNTER — Ambulatory Visit (INDEPENDENT_AMBULATORY_CARE_PROVIDER_SITE_OTHER): Payer: BLUE CROSS/BLUE SHIELD | Admitting: General Surgery

## 2018-04-30 VITALS — BP 128/74 | HR 77 | Resp 12 | Ht 68.0 in | Wt 163.0 lb

## 2018-04-30 DIAGNOSIS — L02215 Cutaneous abscess of perineum: Secondary | ICD-10-CM

## 2018-04-30 DIAGNOSIS — N739 Female pelvic inflammatory disease, unspecified: Secondary | ICD-10-CM

## 2018-04-30 NOTE — Patient Instructions (Addendum)
Return in two weeks. The patient is aware to call back for any questions or concerns.  

## 2018-04-30 NOTE — Progress Notes (Signed)
Patient ID: Rachel [name], female   DOB: 02/21/1956, 62 y.o.   MRN: [id number]  Chief Complaint  Patient presents with  . Follow-up    HPI Rachel [name] is a 62 y.o. female.  here for follow up perineal abscess excision done on 04/15/18.   Desitin ointment to the perineum for comfort.    HPI  Past Medical History:  Diagnosis Date  . Anginal pain ([place])   . Arthritis   . Body mass index (BMI) of 24.0-24.9 in adult   . Cardiovascular disease   . Current every day smoker    a. ~35 years - > has cut down to < 3/4 ppd  . Headache   . History of UTI   . Hypertension   . Midsternal chest pain    a. 05/2014 Stress Echo: Ex time: 8:09, ECG w/o acute changes, EF nl with possible mid and apical anterior HK-->cath recommended.  . Port-A-Cath in place    RIGHT SIDE  . Rectal cancer ([place]) 2017  . Stroke ([place])    07/2016 tia no neurology followup now seen by pcp  . Syncope and collapse   . Thyroid nodule 2018   FINDINGS CONSISTENT WITH A HURTHLE CELL LESION AND/OR NEOPLASM (BETHESDA CATEGORY IV).    Past Surgical History:  Procedure Laterality Date  . ABDOMINAL HYSTERECTOMY    . ABDOMINAL PERINEAL BOWEL RESECTION N/A 03/16/2017   Procedure: ABDOMINAL PERINEAL RESECTION;  Surgeon: Christene Lye, MD;  Location: [facility] ORS;  Service: General;  Laterality: N/A;  . APPENDECTOMY  03/16/2017   Procedure: APPENDECTOMY;  Surgeon: Christene Lye, MD;  Location: [facility] ORS;  Service: General;;  . BACK SURGERY     l 3,4,5  . BIOPSY THYROID Right 11/16/2016   FINDINGS CONSISTENT WITH A HURTHLE CELL LESION AND/OR NEOPLASM (BETHESDA CATEGORY IV).  [name] CHOLECYSTECTOMY    . COLONOSCOPY WITH PROPOFOL N/A 08/15/2016   Procedure: COLONOSCOPY WITH PROPOFOL;  Surgeon: Lollie Sails, MD;  Location: [facility] ENDOSCOPY;  Service: Endoscopy;  Laterality: N/A;  . COLONOSCOPY WITH PROPOFOL N/A 11/08/2016   Procedure: COLONOSCOPY WITH PROPOFOL;  Surgeon: Christene Lye, MD;  Location: [facility]  ENDOSCOPY;  Service: Endoscopy;  Laterality: N/A;  . COLOSTOMY N/A 11/23/2016   Procedure: SIGMOID COLOSTOMY;  Surgeon: Christene Lye, MD;  Location: [facility] ORS;  Service: General;  Laterality: N/A;  . ERCP    . INCISION AND DRAINAGE PERIRECTAL ABSCESS N/A 04/15/2018   Procedure: RECTAL ABSCESS;  Surgeon: Robert Bellow, MD;  Location: [facility] ORS;  Service: General;  Laterality: N/A;  . LAPAROSCOPY N/A 11/23/2016   Procedure: LAPAROSCOPY DIAGNOSTIC;  Surgeon: Christene Lye, MD;  Location: [facility] ORS;  Service: General;  Laterality: N/A;  . PARTIAL HYSTERECTOMY    . PORTACATH PLACEMENT N/A 08/18/2016   Procedure: INSERTION PORT-A-CATH;  Surgeon: Christene Lye, MD;  Location: [facility] ORS;  Service: General;  Laterality: N/A;  . THYROID LOBECTOMY Right 11/05/2017   Procedure: THYROID LOBECTOMY;  Surgeon: Christene Lye, MD;  Location: [facility] ORS;  Service: General;  Laterality: Right;    Family History  Problem Relation Age of Onset  . Breast cancer Mother   . Hyperlipidemia Father   . Heart disease Paternal Grandmother   . Stroke Maternal Grandfather     Social History Social History   Tobacco Use  . Smoking status: Former Smoker    Packs/day: 0.25    Years: 35.00    Pack years: 8.75    Types: Cigarettes  Last attempt to quit: 08/11/2016    Years since quitting: 1.7  . Smokeless tobacco: Never Used  Substance Use Topics  . Alcohol use: No  . Drug use: No    Allergies  Allergen Reactions  . Prednisone Other (See Comments)    BLISTERS IN MOUTH AND ON TONGUE    Current Outpatient Medications  Medication Sig Dispense Refill  . amLODipine (NORVASC) 10 MG tablet Take 10 mg by mouth every morning.     [name] aspirin EC 81 MG tablet Take 81 mg by mouth daily.    [name] docusate sodium (COLACE) 100 MG capsule Take 100 mg by mouth daily as needed.     . lidocaine-prilocaine (EMLA) cream Apply 1 application topically as needed (for port access).    . nitroGLYCERIN  (NITROSTAT) 0.4 MG SL tablet Place 1 tablet (0.4 mg total) under the tongue every 5 (five) minutes as needed for chest pain. 25 tablet 6  . traMADol (ULTRAM) 50 MG tablet Take 1 tablet (50 mg total) by mouth every 6 (six) hours as needed (for pain.). (Patient taking differently: Take 100 mg by mouth 2 (two) times daily. ) 30 tablet 0  . venlafaxine XR (EFFEXOR-XR) 75 MG 24 hr capsule Take 75 mg by mouth daily with breakfast.     No current facility-administered medications for this visit.     Review of Systems Review of Systems  Constitutional: Negative.   Respiratory: Negative.   Cardiovascular: Negative.    Drain removed .   Blood pressure 128/74, pulse 77, resp. rate 12, height 5\' 8"  (1.727 m), weight 163 lb (73.9 kg).  Physical Exam Physical Exam  Constitutional: She is oriented to person, place, and time. She appears well-developed and well-nourished.  Genitourinary:     Neurological: She is alert and oriented to person, place, and time.  Skin: Skin is warm and dry.       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     Plan  Return in two weeks.  The patient is aware to call back for any questions or concerns.   HPI, Physical Exam, Assessment and Plan have been scribed under the direction and in the presence of Hervey Ard, MD.  [name], CMA   Forest Gleason Shannan Garfinkel 04/30/2018, 9:22 PM

## 2018-05-14 ENCOUNTER — Encounter: Payer: Self-pay | Admitting: General Surgery

## 2018-05-14 ENCOUNTER — Ambulatory Visit (INDEPENDENT_AMBULATORY_CARE_PROVIDER_SITE_OTHER): Payer: BLUE CROSS/BLUE SHIELD | Admitting: General Surgery

## 2018-05-14 VITALS — BP 128/76 | HR 78 | Resp 14 | Ht 68.0 in | Wt 165.0 lb

## 2018-05-14 DIAGNOSIS — L02215 Cutaneous abscess of perineum: Secondary | ICD-10-CM

## 2018-05-14 NOTE — Progress Notes (Signed)
Patient ID: Rachel [name], female   DOB: [birth year], 62 y.o.   MRN: [id number]  Chief Complaint  Patient presents with  . Follow-up    HPI Rachel [name] is a 62 y.o. female following up from a pelvic abscess drainage done on 04/15/18. She reports that she is improving.  HPI  Past Medical History:  Diagnosis Date  . Anginal pain ([place])   . Arthritis   . Body mass index (BMI) of 24.0-24.9 in adult   . Cardiovascular disease   . Current every day smoker    a. ~35 years - > has cut down to < 3/4 ppd  . Headache   . History of UTI   . Hypertension   . Midsternal chest pain    a. 05/2014 Stress Echo: Ex time: 8:09, ECG w/o acute changes, EF nl with possible mid and apical anterior HK-->cath recommended.  . Port-A-Cath in place    RIGHT SIDE  . Rectal cancer ([place]) 2017  . Stroke ([place])    07/2016 tia no neurology followup now seen by pcp  . Syncope and collapse   . Thyroid nodule 2018   FINDINGS CONSISTENT WITH A HURTHLE CELL LESION AND/OR NEOPLASM (BETHESDA CATEGORY IV).    Past Surgical History:  Procedure Laterality Date  . ABDOMINAL HYSTERECTOMY    . ABDOMINAL PERINEAL BOWEL RESECTION N/A 03/16/2017   Procedure: ABDOMINAL PERINEAL RESECTION;  Surgeon: Christene Lye, MD;  Location: [facility] ORS;  Service: General;  Laterality: N/A;  . APPENDECTOMY  03/16/2017   Procedure: APPENDECTOMY;  Surgeon: Christene Lye, MD;  Location: [facility] ORS;  Service: General;;  . BACK SURGERY     l 3,4,5  . BIOPSY THYROID Right 11/16/2016   FINDINGS CONSISTENT WITH A HURTHLE CELL LESION AND/OR NEOPLASM (BETHESDA CATEGORY IV).  [name] CHOLECYSTECTOMY    . COLONOSCOPY WITH PROPOFOL N/A 08/15/2016   Procedure: COLONOSCOPY WITH PROPOFOL;  Surgeon: Lollie Sails, MD;  Location: [facility] ENDOSCOPY;  Service: Endoscopy;  Laterality: N/A;  . COLONOSCOPY WITH PROPOFOL N/A 11/08/2016   Procedure: COLONOSCOPY WITH PROPOFOL;  Surgeon: Christene Lye, MD;  Location: [facility] ENDOSCOPY;  Service: Endoscopy;   Laterality: N/A;  . COLOSTOMY N/A 11/23/2016   Procedure: SIGMOID COLOSTOMY;  Surgeon: Christene Lye, MD;  Location: [facility] ORS;  Service: General;  Laterality: N/A;  . ERCP    . INCISION AND DRAINAGE PERIRECTAL ABSCESS N/A 04/15/2018   Procedure: RECTAL ABSCESS;  Surgeon: Robert Bellow, MD;  Location: [facility] ORS;  Service: General;  Laterality: N/A;  . LAPAROSCOPY N/A 11/23/2016   Procedure: LAPAROSCOPY DIAGNOSTIC;  Surgeon: Christene Lye, MD;  Location: [facility]S;  Service: General;  Laterality: N/A;  . PARTIAL HYSTERECTOMY    . PORTACATH PLACEMENT N/A 08/18/2016   Procedure: INSERTION PORT-A-CATH;  Surgeon: Christene Lye, MD;  Location: [facility] ORS;  Service: General;  Laterality: N/A;  . THYROID LOBECTOMY Right 11/05/2017   Procedure: THYROID LOBECTOMY;  Surgeon: Christene Lye, MD;  Location: [facility] ORS;  Service: General;  Laterality: Right;    Family History  Problem Relation Age of Onset  . Breast cancer Mother   . Hyperlipidemia Father   . Heart disease Paternal Grandmother   . Stroke Maternal Grandfather     Social History Social History   Tobacco Use  . Smoking status: Former Smoker    Packs/day: 0.25    Years: 35.00    Pack years: 8.75    Types: Cigarettes    Last attempt to quit: 08/11/2016  Years since quitting: 1.7  . Smokeless tobacco: Never Used  Substance Use Topics  . Alcohol use: No  . Drug use: No    Allergies  Allergen Reactions  . Prednisone Other (See Comments)    BLISTERS IN MOUTH AND ON TONGUE    Current Outpatient Medications  Medication Sig Dispense Refill  . amLODipine (NORVASC) 10 MG tablet Take 10 mg by mouth every morning.     [name] aspirin EC 81 MG tablet Take 81 mg by mouth daily.    [name] docusate sodium (COLACE) 100 MG capsule Take 100 mg by mouth daily as needed.     . lidocaine-prilocaine (EMLA) cream Apply 1 application topically as needed (for port access).    . nitroGLYCERIN (NITROSTAT) 0.4 MG SL tablet Place  1 tablet (0.4 mg total) under the tongue every 5 (five) minutes as needed for chest pain. 25 tablet 6  . traMADol (ULTRAM) 50 MG tablet Take 1 tablet (50 mg total) by mouth every 6 (six) hours as needed (for pain.). (Patient taking differently: Take 100 mg by mouth 2 (two) times daily. ) 30 tablet 0  . venlafaxine XR (EFFEXOR-XR) 75 MG 24 hr capsule Take 75 mg by mouth daily with breakfast.     No current facility-administered medications for this visit.     Review of Systems Review of Systems  Constitutional: Negative.   Respiratory: Negative.   Cardiovascular: Negative.     Blood pressure 128/76, pulse 78, resp. rate 14, height 5\' 8"  (1.727 m), weight 165 lb (74.8 kg).  Physical Exam Physical Exam  Constitutional: She is oriented to person, place, and time. She appears well-developed and well-nourished.  Genitourinary:     Genitourinary Comments: Small amount of drainage noted.   Neurological: She is alert and oriented to person, place, and time.  Skin: Skin is warm and dry.  Psychiatric: She has a normal mood and affect.       Assessment    Doing well post debridement of retroperitoneal chronic inflammatory tissue.  Candidate for follow-up colonoscopy.    Plan    Follow up in  3 months  The patient's original diagnostic colonoscopy was completed by Loistine Simas, MD.  She would like to return his office.  Will send a copy of today's notes for his review.      HPI, Physical Exam, Assessment and Plan have been scribed under the direction and in the presence of Robert Bellow, MD  Concepcion Living, LPN  I have completed the exam and reviewed the above documentation for accuracy and completeness.  I agree with the above.  Haematologist has been used and any errors in dictation or transcription are unintentional.  Hervey Ard, M.D., F.A.C.S.  Forest Gleason Gareld Obrecht 05/14/2018, 9:19 PM

## 2018-05-14 NOTE — Patient Instructions (Addendum)
Follow up in 3 months

## 2018-05-22 ENCOUNTER — Ambulatory Visit: Payer: BLUE CROSS/BLUE SHIELD | Admitting: Oncology

## 2018-05-22 ENCOUNTER — Other Ambulatory Visit: Payer: BLUE CROSS/BLUE SHIELD

## 2018-05-23 ENCOUNTER — Ambulatory Visit: Payer: BLUE CROSS/BLUE SHIELD | Admitting: General Surgery

## 2018-06-10 ENCOUNTER — Inpatient Hospital Stay: Payer: BLUE CROSS/BLUE SHIELD | Attending: Internal Medicine

## 2018-06-10 ENCOUNTER — Inpatient Hospital Stay (HOSPITAL_BASED_OUTPATIENT_CLINIC_OR_DEPARTMENT_OTHER): Payer: BLUE CROSS/BLUE SHIELD | Admitting: Internal Medicine

## 2018-06-10 ENCOUNTER — Encounter: Payer: Self-pay | Admitting: Internal Medicine

## 2018-06-10 VITALS — BP 137/88 | HR 69 | Temp 97.6°F | Resp 16 | Wt 168.2 lb

## 2018-06-10 DIAGNOSIS — C775 Secondary and unspecified malignant neoplasm of intrapelvic lymph nodes: Secondary | ICD-10-CM

## 2018-06-10 DIAGNOSIS — G620 Drug-induced polyneuropathy: Secondary | ICD-10-CM | POA: Diagnosis not present

## 2018-06-10 DIAGNOSIS — Z85048 Personal history of other malignant neoplasm of rectum, rectosigmoid junction, and anus: Secondary | ICD-10-CM | POA: Diagnosis not present

## 2018-06-10 DIAGNOSIS — C20 Malignant neoplasm of rectum: Secondary | ICD-10-CM

## 2018-06-10 DIAGNOSIS — R970 Elevated carcinoembryonic antigen [CEA]: Secondary | ICD-10-CM | POA: Diagnosis not present

## 2018-06-10 DIAGNOSIS — Z923 Personal history of irradiation: Secondary | ICD-10-CM | POA: Diagnosis not present

## 2018-06-10 DIAGNOSIS — Z9221 Personal history of antineoplastic chemotherapy: Secondary | ICD-10-CM | POA: Diagnosis not present

## 2018-06-10 LAB — CBC WITH DIFFERENTIAL/PLATELET
BASOS ABS: 0.1 10*3/uL (ref 0–0.1)
BASOS PCT: 1 %
EOS ABS: 0.1 10*3/uL (ref 0–0.7)
Eosinophils Relative: 2 %
HCT: 38.6 % (ref 35.0–47.0)
HEMOGLOBIN: 13.1 g/dL (ref 12.0–16.0)
Lymphocytes Relative: 17 %
Lymphs Abs: 1.2 10*3/uL (ref 1.0–3.6)
MCH: 30.7 pg (ref 26.0–34.0)
MCHC: 34 g/dL (ref 32.0–36.0)
MCV: 90.4 fL (ref 80.0–100.0)
Monocytes Absolute: 0.5 10*3/uL (ref 0.2–0.9)
Monocytes Relative: 8 %
NEUTROS PCT: 72 %
Neutro Abs: 5 10*3/uL (ref 1.4–6.5)
Platelets: 163 10*3/uL (ref 150–440)
RBC: 4.27 MIL/uL (ref 3.80–5.20)
RDW: 14.7 % — ABNORMAL HIGH (ref 11.5–14.5)
WBC: 7 10*3/uL (ref 3.6–11.0)

## 2018-06-10 LAB — COMPREHENSIVE METABOLIC PANEL
ALK PHOS: 129 U/L — AB (ref 38–126)
ALT: 15 U/L (ref 0–44)
AST: 25 U/L (ref 15–41)
Albumin: 3.8 g/dL (ref 3.5–5.0)
Anion gap: 12 (ref 5–15)
BUN: 13 mg/dL (ref 8–23)
CALCIUM: 9.3 mg/dL (ref 8.9–10.3)
CO2: 26 mmol/L (ref 22–32)
CREATININE: 0.7 mg/dL (ref 0.44–1.00)
Chloride: 102 mmol/L (ref 98–111)
Glucose, Bld: 141 mg/dL — ABNORMAL HIGH (ref 70–99)
Potassium: 3.4 mmol/L — ABNORMAL LOW (ref 3.5–5.1)
Sodium: 140 mmol/L (ref 135–145)
TOTAL PROTEIN: 7.1 g/dL (ref 6.5–8.1)
Total Bilirubin: 0.3 mg/dL (ref 0.3–1.2)

## 2018-06-10 MED ORDER — HEPARIN SOD (PORK) LOCK FLUSH 100 UNIT/ML IV SOLN
500.0000 [IU] | Freq: Once | INTRAVENOUS | Status: AC
  Administered 2018-06-10: 500 [IU] via INTRAVENOUS

## 2018-06-10 MED ORDER — SODIUM CHLORIDE 0.9% FLUSH
10.0000 mL | Freq: Once | INTRAVENOUS | Status: AC
  Administered 2018-06-10: 10 mL via INTRAVENOUS
  Filled 2018-06-10: qty 10

## 2018-06-10 MED ORDER — HEPARIN SOD (PORK) LOCK FLUSH 100 UNIT/ML IV SOLN
INTRAVENOUS | Status: AC
  Filled 2018-06-10: qty 5

## 2018-06-10 NOTE — Assessment & Plan Note (Addendum)
#   Rectal  Cancer- stage III- s/p neoadjuvant chemoradiation with 5-FU continuous infusion with radiation; s/p APR April 7th 2018; currently status post adjuvant FOLFOX [finished November 2018].  S  #Clinically no evidence of recurrence; monitor CEA.  Stable.  Await on the CEA from today.  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 Improved.   # port flush every 2 months; for now; await on CEA re: imaging; otherwise-follow up in 2 months/labs/port flush

## 2018-06-10 NOTE — Progress Notes (Signed)
[facility] OFFICE PROGRESS NOTE  Patient Care Team: Lynnell Jude, MD as PCP - General (Family Medicine) Cammie Sickle, MD as Consulting Physician (Internal Medicine) Noreene Filbert, MD as Referring Physician (Radiation Oncology) Clent Jacks, RN as Oncology Nurse Navigator Christene Lye, MD (General Surgery)  Cancer Staging No matching staging information was found for the patient.   Oncology History   # SEP 2017- RECTAL CA mod diff adeno; STAGE III [no EUS; positive ~53m LN; Dr.sankar] CEA-7; Sep 21st START 5FU-RT [finished NOV 2017]; April 7th APR- 2018- [id number] [5LN]; positive for LVI/PNI [poor response to chemo]; Margins-Negative.  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  female pleasant patient above history stage III rectal cancer is here for follow-up.  In the interim patient had a perianal abscess drained by Dr. BBary Castilla   Denies any tingling or numbness.  Denies any pain.  Denies any nausea vomiting.  No blood in stools black or stools.  Review of Systems  Constitutional: Negative for chills, diaphoresis, fever, malaise/fatigue and weight loss.  HENT: Negative for nosebleeds and sore throat.   Eyes: Negative for double vision.  Respiratory: Negative for cough, hemoptysis, sputum production, shortness of breath and wheezing.   Cardiovascular: Negative for chest pain, palpitations, orthopnea and leg swelling.  Gastrointestinal: Negative for abdominal pain, blood in stool, constipation, diarrhea, heartburn, melena, nausea and vomiting.  Genitourinary: Negative for  dysuria, frequency and urgency.  Musculoskeletal: Negative for back pain and joint pain.  Skin: Negative.  Negative for itching and rash.  Neurological: Negative for dizziness, tingling, focal weakness, weakness and headaches.  Endo/Heme/Allergies: Does not bruise/bleed easily.  Psychiatric/Behavioral: Negative for depression. The patient is not nervous/anxious and does not have insomnia.       PAST MEDICAL HISTORY :  Past Medical History:  Diagnosis Date  . [place]   . Arthritis   . Body mass index (BMI) of 24.0-24.9 in adult   . Cardiovascular disease   . Current every day smoker    a. ~35 years - > has cut down to < 3/4 ppd  . Headache   . History of UTI   . Hypertension   . Midsternal chest pain    a. 05/2014 Stress Echo: Ex time: 8:09, ECG w/o acute changes, EF nl with possible mid and apical anterior HK-->cath recommended.  . Port-A-Cath in place    RIGHT SIDE  . Rectal cancer ([place] 2017  . [place] ([place]    07/2016 tia no neurology followup now seen by pcp  . Syncope and collapse   . Thyroid nodule 2018   FINDINGS CONSISTENT WITH A HURTHLE CELL LESION AND/OR NEOPLASM (BETHESDA CATEGORY IV).    PAST SURGICAL HISTORY :   Past Surgical History:  Procedure Laterality Date  . ABDOMINAL HYSTERECTOMY    . ABDOMINAL PERINEAL BOWEL RESECTION N/A 03/16/2017   Procedure: ABDOMINAL PERINEAL RESECTION;  Surgeon: SChristene Lye MD;  Location: [facility];  Service: General;  Laterality: N/A;  . APPENDECTOMY  03/16/2017   Procedure: APPENDECTOMY;  Surgeon: SChristene Lye MD;  Location: [facility] ORS;  Service: General;;  .  BACK SURGERY     l 3,4,5  . BIOPSY THYROID Right 11/16/2016   FINDINGS CONSISTENT WITH A HURTHLE CELL LESION AND/OR NEOPLASM (BETHESDA CATEGORY IV).  [name] CHOLECYSTECTOMY    . COLONOSCOPY WITH PROPOFOL N/A 08/15/2016   Procedure: COLONOSCOPY WITH PROPOFOL;  Surgeon: Lollie Sails, MD;  Location: [facility] ENDOSCOPY;  Service: Endoscopy;  Laterality:  N/A;  . COLONOSCOPY WITH PROPOFOL N/A 11/08/2016   Procedure: COLONOSCOPY WITH PROPOFOL;  Surgeon: Christene Lye, MD;  Location: [facility] ENDOSCOPY;  Service: Endoscopy;  Laterality: N/A;  . COLOSTOMY N/A 11/23/2016   Procedure: SIGMOID COLOSTOMY;  Surgeon: Christene Lye, MD;  Location: [facility] ORS;  Service: General;  Laterality: N/A;  . ERCP    . INCISION AND DRAINAGE PERIRECTAL ABSCESS N/A 04/15/2018   Procedure: RECTAL ABSCESS;  Surgeon: Robert Bellow, MD;  Location: [facility] ORS;  Service: General;  Laterality: N/A;  . LAPAROSCOPY N/A 11/23/2016   Procedure: LAPAROSCOPY DIAGNOSTIC;  Surgeon: Christene Lye, MD;  Location: [facility] OR[facility];  Service: General;  Laterality: N/A;  . PARTIAL HYSTERECTOMY    . PORTACATH PLACEMENT N/A 08/18/2016   Procedure: INSERTION PORT-A-CATH;  Surgeon: Christene Lye, MD;  Location: [facility] ORS;  Service: General;  Laterality: N/A;  . THYROID LOBECTOMY Right 11/05/2017   Procedure: THYROID LOBECTOMY;  Surgeon: Christene Lye, MD;  Location: [facility] ORS;  Service: General;  Laterality: Right;    FAMILY HISTORY :   Family History  Problem Relation Age of Onset  . Breast cancer Mother   . Hyperlipidemia Father   . Heart disease Paternal Grandmother   . Stroke Maternal Grandfather     SOCIAL HISTORY:   Social History   Tobacco Use  . Smoking status: Former Smoker    Packs/day: 0.25    Years: 35.00    Pack years: 8.75    Types: Cigarettes    Last attempt to quit: 08/11/2016    Years since quitting: 1.8  . Smokeless tobacco: Never Used  Substance Use Topics  . Alcohol use: No  . Drug use: No    ALLERGIES:  is allergic to prednisone.  MEDICATIONS:  Current Outpatient Medications  Medication Sig Dispense Refill  . amLODipine (NORVASC) 10 MG tablet Take 10 mg by mouth every morning.     [name] aspirin EC 81 MG tablet Take 81 mg by mouth daily.    [name] docusate sodium (COLACE) 100 MG capsule Take 100 mg by mouth daily as needed.     .  lidocaine-prilocaine (EMLA) cream Apply 1 application topically as needed (for port access).    . nitroGLYCERIN (NITROSTAT) 0.4 MG SL tablet Place 1 tablet (0.4 mg total) under the tongue every 5 (five) minutes as needed for chest pain. 25 tablet 6  . traMADol (ULTRAM) 50 MG tablet Take 1 tablet (50 mg total) by mouth every 6 (six) hours as needed (for pain.). (Patient taking differently: Take 100 mg by mouth 2 (two) times daily. ) 30 tablet 0  . venlafaxine XR (EFFEXOR-XR) 75 MG 24 hr capsule Take 75 mg by mouth daily with breakfast.     No current facility-administered medications for this visit.     PHYSICAL EXAMINATION: ECOG PERFORMANCE STATUS: 0 - Asymptomatic  BP 137/88 (BP Location: Left Arm, Patient Position: Sitting)   Pulse 69   Temp 97.6 F (36.4 C) (Tympanic)   Resp 16   Wt 168 lb 3.2 oz (76.3 kg)   BMI 25.57 kg/m   Filed Weights   06/10/18 1120  Weight: 168 lb 3.2 oz (76.3 kg)    GENERAL: Well-nourished well-developed; Alert, no distress and comfortable.  Accompanied by family.  EYES: no pallor or icterus OROPHARYNX: no thrush or ulceration; NECK: supple; no lymph nodes felt. LYMPH:  no palpable lymphadenopathy in the axillary or inguinal regions LUNGS: Decreased breath sounds auscultation bilaterally. No wheeze or crackles HEART/CVS: regular rate & rhythm and no murmurs; No lower extremity edema ABDOMEN:abdomen soft, non-tender and normal bowel sounds. No hepatomegaly or splenomegaly.  Musculoskeletal:no cyanosis of digits and no clubbing  PSYCH: alert & oriented x 3 with fluent speech NEURO: no focal motor/sensory deficits SKIN:  no rashes or significant lesions    LABORATORY DATA:  I have reviewed the data as listed    Component Value Date/Time   NA 140 06/10/2018 1059   NA 140 03/08/2017 1223   NA 144 07/25/2014 0444   K 3.4 (L) 06/10/2018 1059   K 3.7 07/25/2014 0444   CL 102 06/10/2018 1059   CL 107 07/25/2014 0444   CO2 26 06/10/2018 1059   CO2  30 07/25/2014 0444   GLUCOSE 141 (H) 06/10/2018 1059   GLUCOSE 87 07/25/2014 0444   BUN 13 06/10/2018 1059   BUN 13 03/08/2017 1223   BUN 8 07/25/2014 0444   CREATININE 0.70 06/10/2018 1059   CREATININE 0.69 07/25/2014 0444   CALCIUM 9.3 06/10/2018 1059   CALCIUM 8.4 (L) 07/25/2014 0444   PROT 7.1 06/10/2018 1059   PROT 6.8 03/08/2017 1223   PROT 6.5 07/25/2014 0444   ALBUMIN 3.8 06/10/2018 1059   ALBUMIN 4.2 03/08/2017 1223   ALBUMIN 2.6 (L) 07/25/2014 0444   AST 25 06/10/2018 1059   AST 96 (H) 07/25/2014 0444   ALT 15 06/10/2018 1059   ALT 123 (H) 07/25/2014 0444   ALKPHOS 129 (H) 06/10/2018 1059   ALKPHOS 189 (H) 07/25/2014 0444   BILITOT 0.3 06/10/2018 1059   BILITOT 0.2 03/08/2017 1223   BILITOT 2.0 (H) 07/25/2014 0444   GFRNONAA >60 06/10/2018 1059   GFRNONAA >60 07/25/2014 0444   GFRAA >60 06/10/2018 1059   GFRAA >60 07/25/2014 0444    No results found for: SPEP, UPEP  Lab Results  Component Value Date   WBC 7.0 06/10/2018   NEUTROABS 5.0 06/10/2018   HGB 13.1 06/10/2018   HCT 38.6 06/10/2018   MCV 90.4 06/10/2018   PLT 163 06/10/2018      Chemistry      Component Value Date/Time   NA 140 06/10/2018 1059   NA 140 03/08/2017 1223   NA 144 07/25/2014 0444   K 3.4 (L) 06/10/2018 1059   K 3.7 07/25/2014 0444   CL 102 06/10/2018 1059   CL 107 07/25/2014 0444   CO2 26 06/10/2018 1059   CO2 30 07/25/2014 0444   BUN 13 06/10/2018 1059   BUN 13 03/08/2017 1223   BUN 8 07/25/2014 0444   CREATININE 0.70 06/10/2018 1059   CREATININE 0.69 07/25/2014 0444      Component Value Date/Time   CALCIUM 9.3 06/10/2018 1059   CALCIUM 8.4 (L) 07/25/2014 0444   ALKPHOS 129 (H) 06/10/2018 1059   ALKPHOS 189 (H) 07/25/2014 0444   AST 25 06/10/2018 1059   AST 96 (H) 07/25/2014 0444   ALT 15 06/10/2018 1059   ALT 123 (H) 07/25/2014 0444   BILITOT 0.3 06/10/2018 1059   BILITOT 0.2 03/08/2017 1223   BILITOT 2.0 (H) 07/25/2014 0444       RADIOGRAPHIC STUDIES: I  have  personally reviewed the radiological images as listed and agreed with the findings in the report. No results found.   ASSESSMENT & PLAN:  Rectal cancer metastasized to intrapelvic lymph node ([place]) # Rectal  Cancer- stage III- s/p neoadjuvant chemoradiation with 5-FU continuous infusion with radiation; s/p APR April 7th 2018; currently status post adjuvant FOLFOX [finished November 2018].  S  #Clinically no evidence of recurrence; monitor CEA.  Stable.  Await on the CEA from today.  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 Improved.   # port flush every 2 months; for now; await on CEA re: imaging; otherwise-follow up in 2 months/labs/port flush    No orders of the defined types were placed in this encounter.  All questions were answered. The patient knows to call the clinic with any problems, questions or concerns.      Cammie Sickle, MD 06/10/2018 1:52 PM

## 2018-06-11 ENCOUNTER — Telehealth: Payer: Self-pay | Admitting: Internal Medicine

## 2018-06-11 DIAGNOSIS — C20 Malignant neoplasm of rectum: Secondary | ICD-10-CM

## 2018-06-11 DIAGNOSIS — C775 Secondary and unspecified malignant neoplasm of intrapelvic lymph nodes: Principal | ICD-10-CM

## 2018-06-11 LAB — CEA: CEA1: 6.8 ng/mL — AB (ref 0.0–4.7)

## 2018-06-11 NOTE — Telephone Encounter (Signed)
msg sent to PA team to ck on PA if needed for pet scan.  I also spoke with patient regarding these results. Pt is agreeable to set up pet scan asap.

## 2018-06-11 NOTE — Telephone Encounter (Signed)
Heather/Brooke-inform patient that her  tumor marker is slightly abnormal; recommend a PET scan in approximately 1 week for further evaluation.  Please schedule a follow-up with me a few days after the PET scan. Thx GB

## 2018-06-14 ENCOUNTER — Telehealth: Payer: Self-pay | Admitting: Internal Medicine

## 2018-06-14 NOTE — Telephone Encounter (Signed)
Spoke to insurance; approved PET scan; Authorization # [id number] [until July 31st 2019]; approved. Please schedule.

## 2018-06-17 ENCOUNTER — Encounter
Admission: RE | Admit: 2018-06-17 | Discharge: 2018-06-17 | Disposition: A | Discharge status: Home / Self Care | Payer: BLUE CROSS/BLUE SHIELD | Source: Ambulatory Visit | Attending: Internal Medicine | Admitting: Internal Medicine

## 2018-06-17 DIAGNOSIS — C775 Secondary and unspecified malignant neoplasm of intrapelvic lymph nodes: Secondary | ICD-10-CM | POA: Insufficient documentation

## 2018-06-17 DIAGNOSIS — C20 Malignant neoplasm of rectum: Secondary | ICD-10-CM | POA: Insufficient documentation

## 2018-06-17 LAB — GLUCOSE, CAPILLARY: Glucose-Capillary: 93 mg/dL (ref 70–99)

## 2018-06-17 MED ORDER — FLUDEOXYGLUCOSE F - 18 (FDG) INJECTION
9.1000 | Freq: Once | INTRAVENOUS | Status: AC | PRN
  Administered 2018-06-17: 9.1 via INTRAVENOUS

## 2018-06-18 ENCOUNTER — Other Ambulatory Visit: Payer: Self-pay

## 2018-06-18 ENCOUNTER — Inpatient Hospital Stay (HOSPITAL_BASED_OUTPATIENT_CLINIC_OR_DEPARTMENT_OTHER): Payer: BLUE CROSS/BLUE SHIELD | Admitting: Internal Medicine

## 2018-06-18 VITALS — BP 134/87 | HR 73 | Temp 97.3°F | Resp 18 | Ht 68.0 in | Wt 176.0 lb

## 2018-06-18 DIAGNOSIS — G620 Drug-induced polyneuropathy: Secondary | ICD-10-CM | POA: Diagnosis not present

## 2018-06-18 DIAGNOSIS — R970 Elevated carcinoembryonic antigen [CEA]: Secondary | ICD-10-CM | POA: Diagnosis not present

## 2018-06-18 DIAGNOSIS — Z85048 Personal history of other malignant neoplasm of rectum, rectosigmoid junction, and anus: Secondary | ICD-10-CM | POA: Diagnosis not present

## 2018-06-18 DIAGNOSIS — C775 Secondary and unspecified malignant neoplasm of intrapelvic lymph nodes: Principal | ICD-10-CM

## 2018-06-18 DIAGNOSIS — C20 Malignant neoplasm of rectum: Secondary | ICD-10-CM

## 2018-06-18 DIAGNOSIS — Z923 Personal history of irradiation: Secondary | ICD-10-CM

## 2018-06-18 DIAGNOSIS — Z9221 Personal history of antineoplastic chemotherapy: Secondary | ICD-10-CM

## 2018-06-18 NOTE — Assessment & Plan Note (Addendum)
#   Rectal  Cancer- stage III; currently under surveillance; however recent CEA elevated at 6; most recent prior CT scan April 2019- for any recurrent disease.  #Given the elevation of the CEA; PET scan was done-that shows no obvious evidence of disease.  PET scan uptake presacral area/and also " anal canal"-however this area was debrided approximately 2 months ago by Dr. Bary Castilla.  Pathology did not reveal any evidence of malignancy; positive for granulation tissue is chronic inflammation.  #I reviewed with the patient and family-that sometimes CEA can rise and fall; however as no evidence of obvious disease noted on the PET scan -I would continue surveillance at this time.  #Recommend follow-up with lab CEA in approximately 2 months/port flush-  As planned.  # I reviewed the blood work- with the patient in detail; also reviewed the imaging independently [as summarized above]; and with the patient in detail.   # 25 minutes face-to-face with the patient discussing the above plan of care; more than 50% of time spent on prognosis/ natural history; counseling and coordination.

## 2018-06-18 NOTE — Progress Notes (Signed)
[facility]form OFFICE PROGRESS NOTE  Patient Care Team: Lynnell Jude, MD as PCP - General (Family Medicine) Cammie Sickle, MD as Consulting Physician (Internal Medicine) Noreene Filbert, MD as Referring Physician (Radiation Oncology) Clent Jacks, RN as Oncology Nurse Navigator Christene Lye, MD (General Surgery)  Cancer Staging No matching staging information was found for the patient.   Oncology History   # SEP 2017- RECTAL CA mod diff adeno; STAGE III [no EUS; positive ~75m LN; Dr.sankar] CEA-7; Sep 21st START 5FU-RT [finished NOV 2017]; April 7th APR- 2018- [id number] [5LN]; positive for LVI/PNI [poor response to chemo]; Margins-Negative.  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  female pleasant patient above history of stage III rectal cancer status post APR is here for follow-up/reviewed the results of the PET scan.  PET scan was ordered because of slightly rising CEA around 6.   Patient complains of mild discharge from the perineal area.  Otherwise no fever chills no abdominal pain no nausea vomiting.  She has been referred to Dr. SGustavo Lahfor repeat colonoscopy/through colostomy.  Review of Systems  Constitutional: Negative for chills, diaphoresis, fever, malaise/fatigue and weight loss.  HENT: Negative for  nosebleeds and sore throat.   Eyes: Negative for double vision.  Respiratory: Negative for cough, hemoptysis, sputum production, shortness of breath and wheezing.   Cardiovascular: Negative for chest pain, palpitations, orthopnea and leg swelling.  Gastrointestinal: Negative for abdominal pain, blood in stool, constipation, diarrhea, heartburn, melena, nausea and vomiting.  Genitourinary: Negative for dysuria, frequency and urgency.  Musculoskeletal: Negative for back pain and joint pain.  Skin: Negative.  Negative for itching and rash.  Neurological: Negative for dizziness, tingling, focal weakness, weakness and headaches.  Endo/Heme/Allergies: Does not bruise/bleed easily.  Psychiatric/Behavioral: Negative for depression. The patient is not nervous/anxious and does not have insomnia.       PAST MEDICAL HISTORY :  Past Medical History:  Diagnosis Date  . [place]   . Arthritis   . Body mass index (BMI) of 24.0-24.9 in adult   . Cardiovascular disease   . Current every day smoker    a. ~35 years - > has cut down to < 3/4 ppd  . Headache   . History of UTI   . Hypertension   . Midsternal chest pain    a. 05/2014 Stress Echo: Ex time: 8:09, ECG w/o acute changes, EF nl with possible mid and apical anterior HK-->cath recommended.  . Port-A-Cath in place    RIGHT SIDE  . [place]  . [place]    07/2016 tia no neurology followup now seen by pcp  . Syncope and collapse   . Thyroid nodule 2018   FINDINGS CONSISTENT WITH A HURTHLE CELL LESION AND/OR NEOPLASM (BETHESDA CATEGORY IV).    PAST SURGICAL HISTORY :   Past Surgical History:  Procedure Laterality Date  .  ABDOMINAL HYSTERECTOMY    . ABDOMINAL PERINEAL BOWEL RESECTION N/A 03/16/2017   Procedure: ABDOMINAL PERINEAL RESECTION;  Surgeon: Christene Lye, MD;  Location: [facility] ORS;  Service: General;  Laterality: N/A;  . APPENDECTOMY  03/16/2017   Procedure: APPENDECTOMY;  Surgeon: Christene Lye, MD;  Location: [facility] ORS;  Service: General;;  . BACK SURGERY     l 3,4,5  . BIOPSY THYROID Right 11/16/2016   FINDINGS CONSISTENT WITH A HURTHLE CELL LESION AND/OR NEOPLASM (BETHESDA CATEGORY IV).  [name] CHOLECYSTECTOMY    . COLONOSCOPY WITH PROPOFOL N/A 08/15/2016   Procedure: COLONOSCOPY WITH PROPOFOL;  Surgeon: Lollie Sails, MD;  Location: [facility] ENDOSCOPY;  Service: Endoscopy;  Laterality: N/A;  . COLONOSCOPY WITH PROPOFOL N/A 11/08/2016   Procedure: COLONOSCOPY WITH PROPOFOL;  Surgeon: Christene Lye, MD;  Location: [facility] ENDOSCOPY;  Service: Endoscopy;  Laterality: N/A;  . COLOSTOMY N/A 11/23/2016   Procedure: SIGMOID COLOSTOMY;  Surgeon: Christene Lye, MD;  Location: [facility] ORS;  Service: General;  Laterality: N/A;  . ERCP    . INCISION AND DRAINAGE PERIRECTAL ABSCESS N/A 04/15/2018   Procedure: RECTAL ABSCESS;  Surgeon: Robert Bellow, MD;  Location: [facility] ORS;  Service: General;  Laterality: N/A;  . LAPAROSCOPY N/A 11/23/2016   Procedure: LAPAROSCOPY DIAGNOSTIC;  Surgeon: Christene Lye, MD;  Location: [facility] ORS;  Service: General;  Laterality: N/A;  . PARTIAL HYSTERECTOMY    . PORTACATH PLACEMENT N/A 08/18/2016   Procedure: INSERTION PORT-A-CATH;  Surgeon: Christene Lye, MD;  Location: [facility] ORS;  Service: General;  Laterality: N/A;  . THYROID LOBECTOMY Right 11/05/2017   Procedure: THYROID LOBECTOMY;  Surgeon: Christene Lye, MD;  Location: [facility] ORS;  Service: General;  Laterality: Right;    FAMILY HISTORY :   Family History  Problem Relation Age of Onset  . Breast cancer Mother   . Hyperlipidemia Father   . Heart disease Paternal Grandmother   . Stroke Maternal Grandfather     SOCIAL HISTORY:   Social History   Tobacco Use  . Smoking status: Former Smoker    Packs/day: 0.25    Years: 35.00    Pack years: 8.75    Types: Cigarettes    Last attempt to quit: 08/11/2016    Years since quitting: 1.8  . Smokeless tobacco: Never Used   Substance Use Topics  . Alcohol use: No  . Drug use: No    ALLERGIES:  is allergic to prednisone.  MEDICATIONS:  Current Outpatient Medications  Medication Sig Dispense Refill  . amLODipine (NORVASC) 10 MG tablet Take 10 mg by mouth every morning.     [name] aspirin EC 81 MG tablet Take 81 mg by mouth daily.    [name] docusate sodium (COLACE) 100 MG capsule Take 100 mg by mouth daily as needed.     . lidocaine-prilocaine (EMLA) cream Apply 1 application topically as needed (for port access).    . traMADol (ULTRAM) 50 MG tablet Take 1 tablet (50 mg total) by mouth every 6 (six) hours as needed (for pain.). (Patient taking differently: Take 100 mg by mouth 2 (two) times daily. ) 30 tablet 0  . venlafaxine XR (EFFEXOR-XR) 75 MG 24 hr capsule Take 75 mg by mouth daily with breakfast.    . nitroGLYCERIN (NITROSTAT) 0.4 MG SL tablet Place 1 tablet (0.4 mg total) under the tongue every 5 (five) minutes as needed for chest pain. (Patient not taking: Reported on 06/18/2018) 25 tablet 6   No current facility-administered medications  for this visit.     PHYSICAL EXAMINATION: ECOG PERFORMANCE STATUS: 0 - Asymptomatic  BP 134/87 (Patient Position: Sitting)   Pulse 73   Temp (!) 97.3 F (36.3 C) (Tympanic)   Resp 18   Ht 5' 8" (1.727 m)   Wt 176 lb (79.8 kg)   BMI 26.76 kg/m   Filed Weights   06/18/18 1015  Weight: 176 lb (79.8 kg)    GENERAL: Well-nourished well-developed; Alert, no distress and comfortable. accompanied by family.  EYES: no pallor or icterus OROPHARYNX: no thrush or ulceration; NECK: supple; no lymph nodes felt. LYMPH:  no palpable lymphadenopathy in the axillary or inguinal regions LUNGS: Decreased breath sounds auscultation bilaterally. No wheeze or crackles HEART/CVS: regular rate & rhythm and no murmurs; No lower extremity edema ABDOMEN:abdomen soft, non-tender and normal bowel sounds. No hepatomegaly or splenomegaly.  Positive for colostomy Musculoskeletal:no cyanosis of  digits and no clubbing  PSYCH: alert & oriented x 3 with fluent speech NEURO: no focal motor/sensory deficits SKIN:  no rashes or significant lesions    LABORATORY DATA:  I have reviewed the data as listed    Component Value Date/Time   NA 140 06/10/2018 1059   NA 140 03/08/2017 1223   NA 144 07/25/2014 0444   K 3.4 (L) 06/10/2018 1059   K 3.7 07/25/2014 0444   CL 102 06/10/2018 1059   CL 107 07/25/2014 0444   CO2 26 06/10/2018 1059   CO2 30 07/25/2014 0444   GLUCOSE 141 (H) 06/10/2018 1059   GLUCOSE 87 07/25/2014 0444   BUN 13 06/10/2018 1059   BUN 13 03/08/2017 1223   BUN 8 07/25/2014 0444   CREATININE 0.70 06/10/2018 1059   CREATININE 0.69 07/25/2014 0444   CALCIUM 9.3 06/10/2018 1059   CALCIUM 8.4 (L) 07/25/2014 0444   PROT 7.1 06/10/2018 1059   PROT 6.8 03/08/2017 1223   PROT 6.5 07/25/2014 0444   ALBUMIN 3.8 06/10/2018 1059   ALBUMIN 4.2 03/08/2017 1223   ALBUMIN 2.6 (L) 07/25/2014 0444   AST 25 06/10/2018 1059   AST 96 (H) 07/25/2014 0444   ALT 15 06/10/2018 1059   ALT 123 (H) 07/25/2014 0444   ALKPHOS 129 (H) 06/10/2018 1059   ALKPHOS 189 (H) 07/25/2014 0444   BILITOT 0.3 06/10/2018 1059   BILITOT 0.2 03/08/2017 1223   BILITOT 2.0 (H) 07/25/2014 0444   GFRNONAA >60 06/10/2018 1059   GFRNONAA >60 07/25/2014 0444   GFRAA >60 06/10/2018 1059   GFRAA >60 07/25/2014 0444    No results found for: SPEP, UPEP  Lab Results  Component Value Date   WBC 7.0 06/10/2018   NEUTROABS 5.0 06/10/2018   HGB 13.1 06/10/2018   HCT 38.6 06/10/2018   MCV 90.4 06/10/2018   PLT 163 06/10/2018      Chemistry      Component Value Date/Time   NA 140 06/10/2018 1059   NA 140 03/08/2017 1223   NA 144 07/25/2014 0444   K 3.4 (L) 06/10/2018 1059   K 3.7 07/25/2014 0444   CL 102 06/10/2018 1059   CL 107 07/25/2014 0444   CO2 26 06/10/2018 1059   CO2 30 07/25/2014 0444   BUN 13 06/10/2018 1059   BUN 13 03/08/2017 1223   BUN 8 07/25/2014 0444   CREATININE 0.70  06/10/2018 1059   CREATININE 0.69 07/25/2014 0444      Component Value Date/Time   CALCIUM 9.3 06/10/2018 1059   CALCIUM 8.4 (L) 07/25/2014 0444   ALKPHOS  129 (H) 06/10/2018 1059   ALKPHOS 189 (H) 07/25/2014 0444   AST 25 06/10/2018 1059   AST 96 (H) 07/25/2014 0444   ALT 15 06/10/2018 1059   ALT 123 (H) 07/25/2014 0444   BILITOT 0.3 06/10/2018 1059   BILITOT 0.2 03/08/2017 1223   BILITOT 2.0 (H) 07/25/2014 0444       RADIOGRAPHIC STUDIES: I have personally reviewed the radiological images as listed and agreed with the findings in the report. Nm Pet Image Restag (ps) Skull Base To Thigh  Result Date: 06/17/2018 CLINICAL DATA:  Subsequent treatment strategy for colorectal carcinoma. EXAM: NUCLEAR MEDICINE PET SKULL BASE TO THIGH TECHNIQUE: 9.1 mCi F-18 FDG was injected intravenously. Full-ring PET imaging was performed from the skull base to thigh after the radiotracer. CT data was obtained and used for attenuation correction and anatomic localization. Fasting blood glucose: 93 mg/dl COMPARISON:  CT 03/18/2018, PET-CT 08/22/2016 FINDINGS: Mediastinal blood pool activity: SUV max 2.2 NECK: Interval resection of hypermetabolic RIGHT thyroid gland nodule. Incidental CT findings: none CHEST: No hypermetabolic mediastinal lymph nodes. No suspicious pulmonary nodules. RIGHT lower lobe nodule measuring 5 mm (image 111/3) is not changed from prior. Incidental CT findings: Port in the anterior chest wall with tip in distal SVC. ABDOMEN/PELVIS: No abnormal metabolic glands are normal. Activity liver. Adrenal hypermetabolic periaortic lymph nodes. There is intense activity associated with the anal canal with SUV max equal 8.9. Patient post low anterior resection the rectum. Presacral soft tissue thickening has mild metabolic activity consistent with postsurgical inflammation. LEFT lower quadrant end  colostomy without complication. Incidental CT findings: Pneumobilia in the LEFT hepatic lobe. No change.  Atherosclerotic calcification of the aorta. SKELETON: No focal hypermetabolic activity to suggest skeletal metastasis. Incidental CT findings: none IMPRESSION: 1. Intense activity through the anal canal. Recommend direct visualization. 2. Post low anterior resection with the soft tissue thickening in the presacral space. Favor benign postsurgical inflammatory change. 3. No metastatic adenopathy in the abdomen pelvis. 4. No pulmonary metastasis. Electronically Signed   By: Suzy Bouchard M.D.   On: 06/17/2018 13:19     ASSESSMENT & PLAN:  Rectal cancer metastasized to intrapelvic lymph node [facility]) # Rectal  Cancer- stage III; currently under surveillance; however recent CEA elevated at 6; most recent prior CT scan April 2019- for any recurrent disease.  #Given the elevation of the CEA; PET scan was done-that shows no obvious evidence of disease.  PET scan uptake presacral area/and also " anal canal"-however this area was debrided approximately 2 months ago by Dr. Bary Castilla.  Pathology did not reveal any evidence of malignancy; positive for granulation tissue is chronic inflammation.  #I reviewed with the patient and family-that sometimes CEA can rise and fall; however as no evidence of obvious disease noted on the PET scan -I would continue surveillance at this time.  #Recommend follow-up with lab CEA in approximately 2 months/port flush-  As planned.  # I reviewed the blood work- with the patient in detail; also reviewed the imaging independently [as summarized above]; and with the patient in detail.   # 25 minutes face-to-face with the patient discussing the above plan of care; more than 50% of time spent on prognosis/ natural history; counseling and coordination.    No orders of the defined types were placed in this encounter.  All questions were answered. The patient knows to call the clinic with any problems, questions or concerns.      Cammie Sickle, MD 06/18/2018 10:46 PM

## 2018-06-30 ENCOUNTER — Telehealth: Payer: Self-pay | Admitting: Oncology

## 2018-06-30 ENCOUNTER — Other Ambulatory Visit: Payer: Self-pay | Admitting: Oncology

## 2018-06-30 DIAGNOSIS — R300 Dysuria: Secondary | ICD-10-CM

## 2018-06-30 NOTE — Telephone Encounter (Signed)
Pt called and reports dysuria since Friday, feels she may have a bladder infection. No fever at home.  Advise patient to go to ER or urgent care if she spikes fever.  Otherwise, will check UA, urine culture Monday morning.  She voices understanding.

## 2018-07-01 ENCOUNTER — Inpatient Hospital Stay: Payer: BLUE CROSS/BLUE SHIELD

## 2018-07-01 ENCOUNTER — Telehealth: Payer: Self-pay | Admitting: *Deleted

## 2018-07-01 DIAGNOSIS — R300 Dysuria: Secondary | ICD-10-CM

## 2018-07-01 DIAGNOSIS — Z85048 Personal history of other malignant neoplasm of rectum, rectosigmoid junction, and anus: Secondary | ICD-10-CM | POA: Diagnosis not present

## 2018-07-01 LAB — CBC WITH DIFFERENTIAL/PLATELET
Basophils Absolute: 0.1 10*3/uL (ref 0–0.1)
Basophils Relative: 1 %
EOS ABS: 0.1 10*3/uL (ref 0–0.7)
EOS PCT: 1 %
HCT: 39.3 % (ref 35.0–47.0)
Hemoglobin: 13.5 g/dL (ref 12.0–16.0)
LYMPHS ABS: 1.1 10*3/uL (ref 1.0–3.6)
LYMPHS PCT: 14 %
MCH: 30.6 pg (ref 26.0–34.0)
MCHC: 34.3 g/dL (ref 32.0–36.0)
MCV: 89.3 fL (ref 80.0–100.0)
MONO ABS: 0.6 10*3/uL (ref 0.2–0.9)
MONOS PCT: 7 %
Neutro Abs: 6.2 10*3/uL (ref 1.4–6.5)
Neutrophils Relative %: 77 %
PLATELETS: 192 10*3/uL (ref 150–440)
RBC: 4.4 MIL/uL (ref 3.80–5.20)
RDW: 14.9 % — ABNORMAL HIGH (ref 11.5–14.5)
WBC: 8.1 10*3/uL (ref 3.6–11.0)

## 2018-07-01 LAB — URINALYSIS, COMPLETE (UACMP) WITH MICROSCOPIC
Bilirubin Urine: NEGATIVE
Glucose, UA: NEGATIVE mg/dL
Ketones, ur: 5 mg/dL — AB
Leukocytes, UA: NEGATIVE
Nitrite: NEGATIVE
PH: 5 (ref 5.0–8.0)
PROTEIN: NEGATIVE mg/dL
Specific Gravity, Urine: 1.028 (ref 1.005–1.030)

## 2018-07-01 NOTE — Telephone Encounter (Signed)
Apt made for 1145 am. Left vm regarding this lab apt. I asked pt to contact office to confirm that this msg was received.

## 2018-07-01 NOTE — Telephone Encounter (Signed)
Patient called back and confirmed this lab apt today.

## 2018-07-01 NOTE — Telephone Encounter (Signed)
Incoming call from patient. She would like prelim results regarding her u/a. I spoke with Dr. Tasia Catchings. Urine did not demonstrate any leukocytes. Wbc was normal. At this time, we will hold off on calling in antibiotic. Patient can try otc AZO and increase po fluid intake and drink cranberry juice per Dr. Tasia Catchings. I educated patient for the MD recommendations. She states "something is going on and I really want to have an apt with Dr. B to further discuss these test results. Dr. B is my doctor. Not Dr. Tasia Catchings. I have already tried these interventions and nothing is helping." I explained that md wanted pt to wait until final cultures are back before prescribing antibiotics. I reassured her that I would contact her with these test results."

## 2018-07-01 NOTE — Telephone Encounter (Signed)
-----   Message from Evelina Dun sent at 07/01/2018  8:46 AM EDT -----   ----- Message ----- From: Earlie Server, MD Sent: 06/30/2018   5:08 PM To: Evelina Dun  Patient called during weekend. She has dysuria.  Please arrange patient for a lab encounter for UA and urine culture, cbc.  Nira Conn can help you too. This is Dr.B's patient.

## 2018-07-03 ENCOUNTER — Telehealth: Payer: Self-pay | Admitting: Internal Medicine

## 2018-07-03 LAB — URINE CULTURE

## 2018-07-03 MED ORDER — CIPROFLOXACIN HCL 500 MG PO TABS: 500.0000 mg | ORAL_TABLET | Freq: Two times a day (BID) | ORAL | 0 refills | Status: DC

## 2018-07-03 NOTE — Telephone Encounter (Signed)
Final urine culture - MULTIPLE SPECIES PRESENT, SUGGEST RECOLLECTION Abnormal

## 2018-07-03 NOTE — Telephone Encounter (Signed)
FYI-  Spoke to patient -symptoms of UTI dysuria increased frequency of urination; foul-smelling.  Urine culture -inconclusive.  Called in a prescription for ciprofloxacin 500 twice daily for 3 days.  If symptoms do not resolve recommend UA/culture; possible referral to urology.

## 2018-07-08 ENCOUNTER — Telehealth: Payer: Self-pay

## 2018-07-08 ENCOUNTER — Inpatient Hospital Stay: Payer: BLUE CROSS/BLUE SHIELD

## 2018-07-08 DIAGNOSIS — Z85048 Personal history of other malignant neoplasm of rectum, rectosigmoid junction, and anus: Secondary | ICD-10-CM | POA: Diagnosis not present

## 2018-07-08 DIAGNOSIS — R350 Frequency of micturition: Secondary | ICD-10-CM

## 2018-07-08 LAB — URINALYSIS, COMPLETE (UACMP) WITH MICROSCOPIC
BILIRUBIN URINE: NEGATIVE
GLUCOSE, UA: NEGATIVE mg/dL
KETONES UR: NEGATIVE mg/dL
LEUKOCYTES UA: NEGATIVE
Nitrite: NEGATIVE
PH: 6 (ref 5.0–8.0)
PROTEIN: NEGATIVE mg/dL
Specific Gravity, Urine: 1.018 (ref 1.005–1.030)

## 2018-07-08 NOTE — Telephone Encounter (Signed)
Patient contacted the office stating that she has completed the course of Cipro 500mg , and she states that her symptoms have come back since completing the abx. She states that she is having abdominal pain, pressure, and burning when urinating.  Per Dr. Jacinto Reap, patient needs to come in for a repeat UA/Culture, and a referral to urology.

## 2018-07-09 LAB — URINE CULTURE: CULTURE: NO GROWTH

## 2018-07-22 NOTE — Progress Notes (Signed)
07/23/2018 4:03 PM   Rachel [name] 01-25-1956 [id number]  Referring provider: Lynnell Jude, MD [address] [place], [place] [address]  Chief Complaint  Patient presents with  . Urinary Incontinence    leakage  . Urinary Tract Infection    HPI: Patient is a 62 -year-old Caucasian female with stage III rectal cancer who is referred to [place] by Dr. Rogue Bussing for urinary frequency with sister, Vaughan Basta.    Reviewing her records, she has had several UA with microscopic hematuria and equivocal urine culture results.  Former smoker quit only one year ago.    She gets frequency, burning and suprapubic pain with the UTI.  She was started on three days of Cipro and her symptoms abated, but they returned as soon as she stopped the medication.  She then started to have extreme bladder spasms.  She then was seen at an urgent and placed on a different antibiotic.  She noticed improvement in four days.  She was placed on the antibiotic for ten days.    She is having associated dysuria, nocturia and urge, stress and upon standing incontinence x 2-3.  Patient denies any gross hematuria, dysuria or suprapubic/flank pain.  Patient denies any fevers, chills, nausea or vomiting.  Her UA is positive for 3-10 RBC's.  Her PVR is 0 mL.    She does have a history of nephrolithiasis, GU surgery or GU trauma.   She is post menopausal.  She has a colostomy at this time.    She is not drinking much water daily.   She is drinking three cups of coffee.  She is drinking two Pepsi's daily.  Occasional tea.  No juices.  No alcohol.      PMH: Past Medical History:  Diagnosis Date  . Anginal pain ([place])   . Arthritis   . Body mass index (BMI) of 24.0-24.9 in adult   . Cardiovascular disease   . Colostomy present ([place])   . Current every day smoker    a. ~35 years - > has cut down to < 3/4 ppd  . Headache   . History of rectal cancer   . History of UTI   . Hypertension   . Midsternal chest pain    a.  05/2014 Stress Echo: Ex time: 8:09, ECG w/o acute changes, EF nl with possible mid and apical anterior HK-->cath recommended.  . Port-A-Cath in place    RIGHT SIDE  . Rectal cancer ([place]) 2017  . Stroke ([place])    07/2016 tia no neurology followup now seen by pcp  . Syncope and collapse   . Thyroid nodule 2018   FINDINGS CONSISTENT WITH A HURTHLE CELL LESION AND/OR NEOPLASM (B[place]ES[place] CATEGORY IV).    Surgical History: Past Surgical History:  Procedure Laterality Date  . ABDOMINAL HYSTERECTOMY    . ABDOMINAL PERINEAL BOWEL RESECTION N/A 03/16/2017   Procedure: ABDOMINAL PERINEAL RESECTION;  Surgeon: Christene Lye, MD;  Location: [facility];  Service: General;  Laterality: N/A;  . APPENDECTOMY  03/16/2017   Procedure: APPENDECTOMY;  Surgeon: Christene Lye, MD;  Location: [facility];  Service: General;;  . BACK SURGERY     l 3,4,5  . BIOPSY THYROID Right 11/16/2016   FINDINGS CONSISTENT WITH A HURTHLE CELL LESION AND/OR NEOPLASM (BETHESDA CATEGORY IV).  [name] CHOLECYSTECTOMY    . COLONOSCOPY WITH PROPOFOL N/A 08/15/2016   Procedure: COLONOSCOPY WITH PROPOFOL;  Surgeon: Lollie Sails, MD;  Location: [facility];  Service: Endoscopy;  Laterality: N/A;  .  COLONOSCOPY WITH PROPOFOL N/A 11/08/2016   Procedure: COLONOSCOPY WITH PROPOFOL;  Surgeon: Christene Lye, MD;  Location: [facility] ENDOSCOPY;  Service: Endoscopy;  Laterality: N/A;  . COLOSTOMY N/A 11/23/2016   Procedure: SIGMOID COLOSTOMY;  Surgeon: Christene Lye, MD;  Location: [facility];  Service: General;  Laterality: N/A;  . ERCP    . INCISION AND DRAINAGE PERIRECTAL ABSCESS N/A 04/15/2018   Procedure: RECTAL ABSCESS;  Surgeon: Robert Bellow, MD;  Location: [facility];  Service: General;  Laterality: N/A;  . LAPAROSCOPY N/A 11/23/2016   Procedure: LAPAROSCOPY DIAGNOSTIC;  Surgeon: Christene Lye, MD;  Location: [facility];  Service: General;  Laterality: N/A;  . PARTIAL HYSTERECTOMY    . PORTACATH PLACEMENT  N/A 08/18/2016   Procedure: INSERTION PORT-A-CATH;  Surgeon: Christene Lye, MD;  Location: [facility];  Service: General;  Laterality: N/A;  . THYROID LOBECTOMY Right 11/05/2017   Procedure: THYROID LOBECTOMY;  Surgeon: Christene Lye, MD;  Location: [facility];  Service: General;  Laterality: Right;    Home Medications:  Allergies as of 07/23/2018      Reactions   Prednisone Other (See Comments)   BLISTERS IN MOUTH AND ON TONGUE      Medication List        Accurate as of 07/23/18 11:59 PM. Always use your most recent med list.          amLODipine 10 MG tablet Commonly known as:  NORVASC Take 10 mg by mouth every morning.   aspirin EC 81 MG tablet Take 81 mg by mouth daily.   ciprofloxacin 500 MG tablet Commonly known as:  CIPRO Take 1 tablet (500 mg total) by mouth 2 (two) times daily.   conjugated estrogens vaginal cream Commonly known as:  PREMARIN Apply 0.5mg  (pea-sized amount)  just inside the vaginal introitus with a finger-tip on  Monday, Wednesday and Friday nights.   docusate sodium 100 MG capsule Commonly known as:  COLACE Take 100 mg by mouth daily as needed.   estradiol 0.1 MG/GM vaginal cream Commonly known as:  ESTRACE Apply 0.5mg  (pea-sized amount)  just inside the vaginal introitus with a finger-tip on Monday, Wednesday and Friday nights.   lidocaine-prilocaine cream Commonly known as:  EMLA Apply 1 application topically as needed (for port access).   lisinopril 10 MG tablet Commonly known as:  PRINIVIL,ZESTRIL   mirabegron ER 25 MG Tb24 tablet Commonly known as:  MYRBETRIQ Take 1 tablet (25 mg total) by mouth daily.   nitroGLYCERIN 0.4 MG SL tablet Commonly known as:  NITROSTAT Place 1 tablet (0.4 mg total) under the tongue every 5 (five) minutes as needed for chest pain.   phenazopyridine 200 MG tablet Commonly known as:  PYRIDIUM TAKE 1 TABLET BY MOUTH THREE TIMES A DAY AFTER MEALS AS NEEDED   shark liver oil-cocoa butter  0.25-3-85.5 % suppository Commonly known as:  PREPARATION H Place rectally.   traMADol 50 MG tablet Commonly known as:  ULTRAM Take 1 tablet (50 mg total) by mouth every 6 (six) hours as needed (for pain.).   venlafaxine XR 75 MG 24 hr capsule Commonly known as:  EFFEXOR-XR Take 75 mg by mouth daily with breakfast.       Allergies:  Allergies  Allergen Reactions  . Prednisone Other (See Comments)    BLISTERS IN MOUTH AND ON TONGUE    Family History: Family History  Problem Relation Age of Onset  . Breast cancer Mother   . Hyperlipidemia Father   .  Heart disease Paternal Grandmother   . Stroke Maternal Grandfather   . Prostate cancer Brother     Social History:  reports that she quit smoking about 1 years ago. Her smoking use included cigarettes. She has a 8.75 pack-year smoking history. She has never used smokeless tobacco. She reports that she does not drink alcohol or use drugs.  ROS: UROLOGY Frequent Urination?: No Hard to postpone urination?: No Burning/pain with urination?: Yes Get up at night to urinate?: Yes Leakage of urine?: Yes Urine stream starts and stops?: No Trouble starting stream?: No Do you have to strain to urinate?: No Blood in urine?: No Urinary tract infection?: Yes Sexually transmitted disease?: No Injury to kidneys or bladder?: No Painful intercourse?: No Weak stream?: No Currently pregnant?: No Vaginal bleeding?: No Last menstrual period?: Hysterectomy  Gastrointestinal Nausea?: Yes Vomiting?: No Indigestion/heartburn?: Yes Diarrhea?: No Constipation?: No  Constitutional Fever: Yes Night sweats?: Yes Weight loss?: No Fatigue?: No  Skin Skin rash/lesions?: No Itching?: Yes  Eyes Blurred vision?: No Double vision?: No  Ears/Nose/Throat Sore throat?: Yes Sinus problems?: No  Hematologic/Lymphatic Swollen glands?: No Easy bruising?: No  Cardiovascular Leg swelling?: Yes Chest pain?: No  Respiratory Cough?:  No Shortness of breath?: Yes  Endocrine Excessive thirst?: No  Musculoskeletal Back pain?: Yes Joint pain?: Yes  Neurological Headaches?: No Dizziness?: No  Psychologic Depression?: Yes Anxiety?: No  Physical Exam: BP 125/82 (BP Location: Left Arm, Patient Position: Sitting, Cuff Size: Normal)   Pulse 87   Ht 5\' 8"  (1.727 m)   Wt 170 lb 12.8 oz (77.5 kg)   BMI 25.97 kg/m   Constitutional: Well nourished. Alert and oriented, No acute distress. HEENT: [place] AT, moist mucus membranes. Trachea midline, no masses. Cardiovascular: No clubbing, cyanosis, or edema. Respiratory: Normal respiratory effort, no increased work of breathing. GI: Abdomen is soft, non tender, non distended, no abdominal masses. Liver and spleen not palpable.  No hernias appreciated.  Stool sample for occult testing is not indicated.  Colostomy in place in left lower quadrant.  GU: No CVA tenderness.  No bladder fullness or masses.  Atrophic external genitalia, normal pubic hair distribution, no lesions.  Normal urethral meatus, no lesions, no prolapse, no discharge.   No urethral masses, tenderness and/or tenderness. No bladder fullness, tenderness or masses. Pale vagina mucosa, poor estrogen effect, no discharge, no lesions, poor pelvic support, grade I cystocele. No rectum.  Cervix and uterus are surgically absent.  No adnexal/parametria masses or tenderness noted.  Anus and perineum are without rashes or lesions.    Skin: No rashes, bruises or suspicious lesions. Lymph: No cervical or inguinal adenopathy. Neurologic: Grossly intact, no focal deficits, moving all 4 extremities. Psychiatric: Normal mood and affect.  Laboratory Data: Lab Results  Component Value Date   WBC 8.1 07/01/2018   HGB 13.5 07/01/2018   HCT 39.3 07/01/2018   MCV 89.3 07/01/2018   PLT 192 07/01/2018    Lab Results  Component Value Date   CREATININE 0.70 06/10/2018    No results found for: PSA  No results found for:  TESTOSTERONE  Lab Results  Component Value Date   HGBA1C 5.5 07/17/2016    Lab Results  Component Value Date   TSH 0.22 (L) 07/23/2014       Component Value Date/Time   CHOL 150 07/17/2016 0534   CHOL 94 07/23/2014 0400   HDL 55 07/17/2016 0534   HDL 44 07/23/2014 0400   CHOLHDL 2.7 07/17/2016 0534   VLDL 10 07/17/2016  0534   VLDL 11 07/23/2014 0400   LDLCALC 85 07/17/2016 0534   LDLCALC 39 07/23/2014 0400    Lab Results  Component Value Date   AST 25 06/10/2018   Lab Results  Component Value Date   ALT 15 06/10/2018   No components found for: ALKALINEPHOPHATASE No components found for: BILIRUBINTOTAL  No results found for: ESTRADIOL  Urinalysis 3-10 RBC's.  See Epic.   I have reviewed the labs.   Pertinent Imaging: CLINICAL DATA:  Restaging metastatic rectal cancer.  EXAM: CT CHEST, ABDOMEN, AND PELVIS WITH CONTRAST  TECHNIQUE: Multidetector CT imaging of the chest, abdomen and pelvis was performed following the standard protocol during bolus administration of intravenous contrast.  CONTRAST:  127mL OMNIPAQUE IOHEXOL 300 MG/ML  SOLN  COMPARISON:  PET-CT 08/22/2016 and abdominal CT 11/15/2016. No CT imaging from 2018.  FINDINGS: CT CHEST FINDINGS  Cardiovascular: The heart is normal in size. No pericardial effusion. Mild tortuosity, ectasia and calcification of the thoracic aorta. The branch vessels are patent. No significant coronary artery calcifications. The pulmonary arteries appear normal. Left-sided chest wall collateral vessels are noted without evidence of subclavian or brachiocephalic obstruction or stenosis.  Mediastinum/Nodes: No mediastinal or hilar mass or adenopathy. The esophagus is unremarkable.  Lungs/Pleura: Mild emphysematous changes and pulmonary scarring, most notably in the lung apices. Stable small scattered pulmonary nodules since the prior PET-CT. There are also areas of bronchiectasis most notably in the  right middle lobe and lingula. No findings suspicious for metastatic disease and no acute overlying pulmonary findings.  Musculoskeletal: No breast masses, supraclavicular or axillary lymphadenopathy. Small scattered lymph nodes appears stable.  No significant bony findings.  CT ABDOMEN PELVIS FINDINGS  Hepatobiliary: No focal hepatic lesions are identified to suggest metastatic disease. The gallbladder is surgically absent. Pneumobilia is noted and likely due to prior sphincterotomy. This is a stable finding. Stable mild common bile duct dilatation.  Pancreas: No focal hepatic lesions or intrahepatic/that no mass, inflammation or ductal dilatation.  Spleen: Normal size.  No focal lesions.  Adrenals/Urinary Tract: Stable bilateral adrenal gland nodules consistent with benign adenomas, unchanged since prior PET-CT.  No significant renal abnormalities. Stable left renal cyst. No obstructing ureteral calculi or bladder calculi. No bladder mass.  Stomach/Bowel: The stomach, duodenum, small bowel and colon are unremarkable. Left lower quadrant colostomy noted. Status post APR with probable postoperative changes with granulation tissue and small loculated fluid collection. No other post surgical CTs are available for comparison. No definite findings for residual or recurrent tumor but difficult to assess with no prior for comparison.  Vascular/Lymphatic: Stable atherosclerotic calcifications involving the aorta and iliac arteries. The branch vessels are patent. The major venous structures are patent.  No mesenteric or retroperitoneal mass or adenopathy.  No pelvic or inguinal adenopathy.  Reproductive: Surgically absent.  Other: No abdominal wall hernia or subcutaneous lesions. Left lower quadrant colostomy without complicating features.  Musculoskeletal: No significant bony findings. Fatty appearance of the sacrum may be due to radiation.  IMPRESSION: 1.  Status post APR and left lower quadrant colostomy. Probable postoperative changes and radiation changes in the perirectal space and presacral area. No postoperative CTs are available for comparison. 2. No findings suspicious for recurrent tumor, regional adenopathy or metastatic disease elsewhere. 3. Stable bilateral adrenal gland nodules. 4. Status post cholecystectomy with pneumobilia likely related to sphincterotomy. 5. Emphysematous changes are noted in lungs with biapical pleural and parenchymal scarring. Small stable scattered pulmonary nodules, likely benign. No findings suspicious for pulmonary metastatic  disease.   Electronically Signed   By: Marijo Sanes M.D.   On: 03/18/2018 12:27  I have independently reviewed the films.    Assessment & Plan:    1. UTI Patient will symptoms of an UTI with inconclusive cultures, but symptoms abated with ten days of antibiotics Will contact [facility] urgent care for records  2. Microscopic hematuria I explained to the patient that there are a number of causes that can be associated with blood in the urine, such as stones, UTI's, damage to the urinary tract and/or cancer. At this time, I felt that the patient warranted further urologic evaluation with 3 or greater RBC's/hpf on microscopic evaluation of the urine.  The AUA guidelines state that a CT urogram is the preferred imaging study to evaluate hematuria  I explained to the patient that a contrast material will be injected into a vein and that in rare instances, an allergic reaction can result and may even life threatening   The patient denies any allergies to contrast, iodine and/or seafoo and is not taking metformin Following the imaging study,  I've recommended a cystoscopy. I described how this is performed, typically in an office setting with a flexible cystoscope. We described the risks, benefits, and possible side effects, the most common of which is a minor amount of blood in  the urine and/or burning which usually resolves in 24 to 48 hours.   The patient had the opportunity to ask questions which were answered. Based upon this discussion, the patient is willing to proceed. Therefore, I've ordered: a CT Urogram and cystoscopy. The patient will return following all of the above for discussion of the results.  UA Urine culture    3. Mixed incontinence Discussed behavioral therapies, bladder training and bladder control strategies Pelvic floor muscle training - patient deferred  Patient is encouraged to increase her water intake Offered medical therapy with anticholinergic therapy or beta-3 adrenergic receptor agonist and the potential side effects of each therapy - would like to try the beta-3 adrenergic receptor agonist (Myrbetriq).  Given Myrbetriq 25 mg samples, #28.  I have reviewed with the patient of the side effects of Myrbetriq, such as: elevation in BP, urinary retention and/or HA.    4. Vaginal atrophy Patient was given a sample of vaginal estrogen cream (Premarin vaginal cream) and instructed to apply 0.5mg  (pea-sized amount)  just inside the vaginal introitus with a finger-tip on Monday, Wednesday and Friday nights.  I explained to the patient that vaginally administered estrogen, which causes only a slight increase in the blood estrogen levels, have fewer contraindications and adverse systemic effects that oral HT. I have also given prescriptions for the Estrace cream and Premarin cream, so that the patient may carry them to the pharmacy to see which one of the branded creams would be most economical for her.  If she finds both medications cost prohibitive, she is instructed to call the office.  We can then call in a compounded vaginal estrogen cream for the patient that may be more affordable.   She will follow up in three months for an exam.     Return for CT Urogram report and cystoscopy.  These notes generated with voice recognition software. I  apologize for typographical errors.  Zara Council, [facility] [address], [place], [place] [address] [phone]

## 2018-07-23 ENCOUNTER — Encounter: Payer: Self-pay | Admitting: Urology

## 2018-07-23 ENCOUNTER — Ambulatory Visit: Payer: BLUE CROSS/BLUE SHIELD | Admitting: Urology

## 2018-07-23 VITALS — BP 125/82 | HR 87 | Ht 68.0 in | Wt 170.8 lb

## 2018-07-23 DIAGNOSIS — R3129 Other microscopic hematuria: Secondary | ICD-10-CM | POA: Diagnosis not present

## 2018-07-23 DIAGNOSIS — N3946 Mixed incontinence: Secondary | ICD-10-CM

## 2018-07-23 DIAGNOSIS — N952 Postmenopausal atrophic vaginitis: Secondary | ICD-10-CM

## 2018-07-23 DIAGNOSIS — R300 Dysuria: Secondary | ICD-10-CM

## 2018-07-23 LAB — URINALYSIS, COMPLETE
BILIRUBIN UA: NEGATIVE
LEUKOCYTES UA: NEGATIVE
Nitrite, UA: NEGATIVE
Urobilinogen, Ur: 0.2 mg/dL (ref 0.2–1.0)
pH, UA: 5.5 (ref 5.0–7.5)

## 2018-07-23 LAB — MICROSCOPIC EXAMINATION: WBC, UA: NONE SEEN /hpf (ref 0–5)

## 2018-07-23 LAB — BLADDER SCAN AMB NON-IMAGING

## 2018-07-23 MED ORDER — ESTROGENS, CONJUGATED 0.625 MG/GM VA CREA: TOPICAL_CREAM | VAGINAL | 12 refills | Status: DC

## 2018-07-23 MED ORDER — MIRABEGRON ER 25 MG PO TB24: 25.0000 mg | ORAL_TABLET | Freq: Every day | ORAL | 0 refills | Status: DC

## 2018-07-23 MED ORDER — ESTRADIOL 0.1 MG/GM VA CREA: TOPICAL_CREAM | VAGINAL | 12 refills | Status: DC

## 2018-07-25 LAB — CULTURE, URINE COMPREHENSIVE

## 2018-08-05 ENCOUNTER — Ambulatory Visit
Admission: RE | Admit: 2018-08-05 | Discharge: 2018-08-05 | Disposition: A | Discharge status: Home / Self Care | Payer: BLUE CROSS/BLUE SHIELD | Source: Ambulatory Visit | Attending: Urology | Admitting: Urology

## 2018-08-05 DIAGNOSIS — I700 Atherosclerosis of aorta: Secondary | ICD-10-CM | POA: Diagnosis not present

## 2018-08-05 DIAGNOSIS — N2889 Other specified disorders of kidney and ureter: Secondary | ICD-10-CM | POA: Diagnosis not present

## 2018-08-05 DIAGNOSIS — K611 Rectal abscess: Secondary | ICD-10-CM | POA: Diagnosis not present

## 2018-08-05 DIAGNOSIS — R3129 Other microscopic hematuria: Secondary | ICD-10-CM | POA: Diagnosis not present

## 2018-08-05 DIAGNOSIS — Z933 Colostomy status: Secondary | ICD-10-CM | POA: Insufficient documentation

## 2018-08-05 DIAGNOSIS — D3501 Benign neoplasm of right adrenal gland: Secondary | ICD-10-CM | POA: Insufficient documentation

## 2018-08-05 DIAGNOSIS — Z9889 Other specified postprocedural states: Secondary | ICD-10-CM | POA: Diagnosis not present

## 2018-08-05 LAB — POCT I-STAT CREATININE: CREATININE: 0.8 mg/dL (ref 0.44–1.00)

## 2018-08-05 MED ORDER — IOPAMIDOL (ISOVUE-300) INJECTION 61%
125.0000 mL | Freq: Once | INTRAVENOUS | Status: AC | PRN
  Administered 2018-08-05: 125 mL via INTRAVENOUS

## 2018-08-09 ENCOUNTER — Other Ambulatory Visit: Payer: Self-pay | Admitting: Internal Medicine

## 2018-08-09 DIAGNOSIS — C20 Malignant neoplasm of rectum: Secondary | ICD-10-CM

## 2018-08-09 DIAGNOSIS — C775 Secondary and unspecified malignant neoplasm of intrapelvic lymph nodes: Principal | ICD-10-CM

## 2018-08-14 ENCOUNTER — Inpatient Hospital Stay (HOSPITAL_BASED_OUTPATIENT_CLINIC_OR_DEPARTMENT_OTHER): Payer: BLUE CROSS/BLUE SHIELD | Admitting: Internal Medicine

## 2018-08-14 ENCOUNTER — Inpatient Hospital Stay: Payer: BLUE CROSS/BLUE SHIELD | Attending: Internal Medicine

## 2018-08-14 VITALS — BP 126/84 | HR 78 | Temp 97.6°F | Resp 16 | Wt 173.0 lb

## 2018-08-14 DIAGNOSIS — M1990 Unspecified osteoarthritis, unspecified site: Secondary | ICD-10-CM | POA: Diagnosis not present

## 2018-08-14 DIAGNOSIS — M25551 Pain in right hip: Secondary | ICD-10-CM | POA: Insufficient documentation

## 2018-08-14 DIAGNOSIS — Z79899 Other long term (current) drug therapy: Secondary | ICD-10-CM | POA: Diagnosis not present

## 2018-08-14 DIAGNOSIS — Z7902 Long term (current) use of antithrombotics/antiplatelets: Secondary | ICD-10-CM | POA: Insufficient documentation

## 2018-08-14 DIAGNOSIS — I10 Essential (primary) hypertension: Secondary | ICD-10-CM | POA: Diagnosis not present

## 2018-08-14 DIAGNOSIS — Z7982 Long term (current) use of aspirin: Secondary | ICD-10-CM | POA: Insufficient documentation

## 2018-08-14 DIAGNOSIS — C20 Malignant neoplasm of rectum: Secondary | ICD-10-CM | POA: Diagnosis present

## 2018-08-14 DIAGNOSIS — F17200 Nicotine dependence, unspecified, uncomplicated: Secondary | ICD-10-CM | POA: Insufficient documentation

## 2018-08-14 DIAGNOSIS — Z8673 Personal history of transient ischemic attack (TIA), and cerebral infarction without residual deficits: Secondary | ICD-10-CM | POA: Diagnosis not present

## 2018-08-14 DIAGNOSIS — C775 Secondary and unspecified malignant neoplasm of intrapelvic lymph nodes: Secondary | ICD-10-CM | POA: Diagnosis not present

## 2018-08-14 DIAGNOSIS — Z87440 Personal history of urinary (tract) infections: Secondary | ICD-10-CM | POA: Insufficient documentation

## 2018-08-14 DIAGNOSIS — I2510 Atherosclerotic heart disease of native coronary artery without angina pectoris: Secondary | ICD-10-CM | POA: Diagnosis not present

## 2018-08-14 DIAGNOSIS — Z95828 Presence of other vascular implants and grafts: Secondary | ICD-10-CM

## 2018-08-14 LAB — COMPREHENSIVE METABOLIC PANEL
ALBUMIN: 3.8 g/dL (ref 3.5–5.0)
ALT: 16 U/L (ref 0–44)
AST: 26 U/L (ref 15–41)
Alkaline Phosphatase: 130 U/L — ABNORMAL HIGH (ref 38–126)
Anion gap: 12 (ref 5–15)
BUN: 12 mg/dL (ref 8–23)
CHLORIDE: 101 mmol/L (ref 98–111)
CO2: 26 mmol/L (ref 22–32)
CREATININE: 0.7 mg/dL (ref 0.44–1.00)
Calcium: 9.3 mg/dL (ref 8.9–10.3)
GFR calc non Af Amer: >60 mL/min (ref >60)
Glucose, Bld: 170 mg/dL — ABNORMAL HIGH (ref 70–99)
Potassium: 3.3 mmol/L — ABNORMAL LOW (ref 3.5–5.1)
SODIUM: 139 mmol/L (ref 135–145)
Total Bilirubin: 0.3 mg/dL (ref 0.3–1.2)
Total Protein: 7.6 g/dL (ref 6.5–8.1)

## 2018-08-14 LAB — CBC WITH DIFFERENTIAL/PLATELET
Basophils Absolute: 0 10*3/uL (ref 0–0.1)
Basophils Relative: 0 %
EOS ABS: 0.1 10*3/uL (ref 0–0.7)
Eosinophils Relative: 1 %
HEMATOCRIT: 39.6 % (ref 35.0–47.0)
HEMOGLOBIN: 13.3 g/dL (ref 12.0–16.0)
LYMPHS ABS: 1.2 10*3/uL (ref 1.0–3.6)
Lymphocytes Relative: 16 %
MCH: 30.4 pg (ref 26.0–34.0)
MCHC: 33.7 g/dL (ref 32.0–36.0)
MCV: 90.3 fL (ref 80.0–100.0)
MONOS PCT: 7 %
Monocytes Absolute: 0.6 10*3/uL (ref 0.2–0.9)
NEUTROS ABS: 5.8 10*3/uL (ref 1.4–6.5)
NEUTROS PCT: 76 %
PLATELETS: 183 10*3/uL (ref 150–440)
RBC: 4.38 MIL/uL (ref 3.80–5.20)
RDW: 14.9 % — ABNORMAL HIGH (ref 11.5–14.5)
WBC: 7.7 10*3/uL (ref 3.6–11.0)

## 2018-08-14 MED ORDER — HEPARIN SOD (PORK) LOCK FLUSH 100 UNIT/ML IV SOLN
500.0000 [IU] | Freq: Once | INTRAVENOUS | Status: AC
  Administered 2018-08-14: 500 [IU] via INTRAVENOUS

## 2018-08-14 MED ORDER — SODIUM CHLORIDE 0.9% FLUSH
10.0000 mL | Freq: Once | INTRAVENOUS | Status: AC
  Administered 2018-08-14: 10 mL via INTRAVENOUS
  Filled 2018-08-14: qty 10

## 2018-08-14 NOTE — Assessment & Plan Note (Addendum)
#   Rectal  Cancer- stage III; currently under surveillance; however recent CEA elevated at 6; PET scan July 2019- for any recurrent disease.  #Clinically no evidence of recurrence; stable.  Await CEA from today.  # right hip pain-worsening;  Esp with extension- [facility].    #Frequent UTIs-question etiology; stable.  CT urogram reviewed question soft tissue thickening of the right proximal ureter; awaiting cystoscopy with urology.  # folllow up in 81month/CEA; referal to [facility] ortho.

## 2018-08-14 NOTE — Progress Notes (Signed)
[facility] OFFICE PROGRESS NOTE  Patient Care Team: Bliss, Laura K, MD as PCP - General (Family Medicine) Brahmanday, Govinda R, MD as Consulting Physician (Internal Medicine) Chrystal, Glenn, MD as Referring Physician (Radiation Oncology) Stanton, Kristi D, RN as Oncology Nurse Navigator [name], [name], MD (General Surgery)  Cancer Staging No matching staging information was found for the patient.   Oncology History   # SEP 2017- RECTAL CA mod diff adeno; STAGE III [no EUS; positive ~7mm LN; Dr.sankar] CEA-7; Sep 21st START 5FU-RT [finished NOV 2017]; April 7th APR- 2018- [id number] [5LN]; positive for LVI/PNI [poor response to chemo]; Margins-Negative.  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  female pleasant patient above history of stage III rectal cancer status post APR is here for follow-up.  In the interim patient was recently evaluated by urology-had a CT urogram; is awaiting cystoscopy next week.  Patient continues to have right-sided hip pain with radiating to the right lower extremity.  Progressive getting worse.  Also walking with a limp.  Patient continues to complain of mild to moderate fatigue.  Review of Systems  Constitutional: Positive for malaise/fatigue. Negative for chills, diaphoresis, fever and weight  loss.  HENT: Negative for nosebleeds and sore throat.   Eyes: Negative for double vision.  Respiratory: Negative for cough, hemoptysis, sputum production, shortness of breath and wheezing.   Cardiovascular: Positive for leg swelling. Negative for chest pain, palpitations and orthopnea.  Gastrointestinal: Negative for abdominal pain, blood in stool, constipation, diarrhea, heartburn, melena, nausea and vomiting.  Genitourinary: Negative for dysuria, frequency and urgency.  Musculoskeletal: Positive for joint pain (hip pain right/back). Negative for back pain.  Skin: Negative.  Negative for itching and rash.  Neurological: Positive for tingling. Negative for dizziness, focal weakness, weakness and headaches.  Endo/Heme/Allergies: Does not bruise/bleed easily.  Psychiatric/Behavioral: Negative for depression. The patient is not nervous/anxious and does not have insomnia.       PAST MEDICAL HISTORY :  Past Medical History:  Diagnosis Date  . Anginal pain (HCC)   . Arthritis   . Body mass index (BMI) of 24.0-24.9 in adult   . Cardiovascular disease   . Colostomy present (HCC)   . Current every day smoker    a. ~35 years - > has cut down to < 3/4 ppd  . Headache   . History of rectal cancer   . History of UTI   . Hypertension   . Midsternal chest pain    a. 05/2014 Stress Echo: Ex time: 8:09, ECG w/o acute changes, EF nl with possible mid and apical anterior HK-->cath recommended.  . Port-A-Cath in place    RIGHT SIDE  . Rectal cancer (HCC) 2017  . Stroke (HCC)    07/2016 tia no neurology followup now seen by pcp  . Syncope and collapse   . Thyroid nodule 2018   FINDINGS CONSISTENT WITH A   HURTHLE CELL LESION AND/OR NEOPLASM (BETHESDA CATEGORY IV).    PAST SURGICAL HISTORY :   Past Surgical History:  Procedure Laterality Date  . ABDOMINAL HYSTERECTOMY    . ABDOMINAL PERINEAL BOWEL RESECTION N/A 03/16/2017   Procedure: ABDOMINAL PERINEAL RESECTION;  Surgeon: Christene Lye,  MD;  Location: [facility] ORS;  Service: General;  Laterality: N/A;  . APPENDECTOMY  03/16/2017   Procedure: APPENDECTOMY;  Surgeon: Christene Lye, MD;  Location: [facility] ORS;  Service: General;;  . BACK SURGERY     l 3,4,5  . BIOPSY THYROID Right 11/16/2016   FINDINGS CONSISTENT WITH A HURTHLE CELL LESION AND/OR NEOPLASM (BETHESDA CATEGORY IV).  [name] CHOLECYSTECTOMY    . COLONOSCOPY WITH PROPOFOL N/A 08/15/2016   Procedure: COLONOSCOPY WITH PROPOFOL;  Surgeon: Lollie Sails, MD;  Location: [facility] ENDOSCOPY;  Service: Endoscopy;  Laterality: N/A;  . COLONOSCOPY WITH PROPOFOL N/A 11/08/2016   Procedure: COLONOSCOPY WITH PROPOFOL;  Surgeon: Christene Lye, MD;  Location: [facility] ENDOSCOPY;  Service: Endoscopy;  Laterality: N/A;  . COLOSTOMY N/A 11/23/2016   Procedure: SIGMOID COLOSTOMY;  Surgeon: Christene Lye, MD;  Location: [facility] ORS;  Service: General;  Laterality: N/A;  . ERCP    . INCISION AND DRAINAGE PERIRECTAL ABSCESS N/A 04/15/2018   Procedure: RECTAL ABSCESS;  Surgeon: Robert Bellow, MD;  Location: [facility] ORS;  Service: General;  Laterality: N/A;  . LAPAROSCOPY N/A 11/23/2016   Procedure: LAPAROSCOPY DIAGNOSTIC;  Surgeon: Christene Lye, MD;  Location: [facility] ORS;  Service: General;  Laterality: N/A;  . PARTIAL HYSTERECTOMY    . PORTACATH PLACEMENT N/A 08/18/2016   Procedure: INSERTION PORT-A-CATH;  Surgeon: Christene Lye, MD;  Location: [facility] ORS;  Service: General;  Laterality: N/A;  . THYROID LOBECTOMY Right 11/05/2017   Procedure: THYROID LOBECTOMY;  Surgeon: Christene Lye, MD;  Location: [facility] ORS;  Service: General;  Laterality: Right;    FAMILY HISTORY :   Family History  Problem Relation Age of Onset  . Breast cancer Mother   . Hyperlipidemia Father   . Heart disease Paternal Grandmother   . Stroke Maternal Grandfather   . Prostate cancer Brother     SOCIAL HISTORY:   Social History   Tobacco Use  . Smoking status: Former Smoker     Packs/day: 0.25    Years: 35.00    Pack years: 8.75    Types: Cigarettes    Last attempt to quit: 08/11/2016    Years since quitting: 2.0  . Smokeless tobacco: Never Used  Substance Use Topics  . Alcohol use: No  . Drug use: No    ALLERGIES:  is allergic to prednisone.  MEDICATIONS:  Current Outpatient Medications  Medication Sig Dispense Refill  . amLODipine (NORVASC) 10 MG tablet Take 10 mg by mouth every morning.     [name] aspirin EC 81 MG tablet Take 81 mg by mouth daily.    [name] conjugated estrogens (PREMARIN) vaginal cream Apply 0.21m (pea-sized amount)  just inside the vaginal introitus with a finger-tip on  Monday, Wednesday and Friday nights. 30 g 12  . docusate sodium (COLACE) 100 MG capsule Take 100 mg by mouth daily as needed.     .Marland Kitchenestradiol (ESTRACE VAGINAL) 0.1 MG/GM vaginal cream Apply 0.521m(pea-sized amount)  just inside the vaginal introitus with a finger-tip on Monday, Wednesday and Friday nights. 30 g 12  . lidocaine-prilocaine (EMLA) cream Apply 1 application topically as needed (for port access).    . Marland Kitchenisinopril (PRINIVIL,ZESTRIL) 10 MG tablet     .  mirabegron ER (MYRBETRIQ) 25 MG TB24 tablet Take 1 tablet (25 mg total) by mouth daily. 1 tablet 0  . nitroGLYCERIN (NITROSTAT) 0.4 MG SL tablet Place 1 tablet (0.4 mg total) under the tongue every 5 (five) minutes as needed for chest pain. 25 tablet 6  . phenazopyridine (PYRIDIUM) 200 MG tablet TAKE 1 TABLET BY MOUTH THREE TIMES A DAY AFTER MEALS AS NEEDED  0  . shark liver oil-cocoa butter (PREPARATION H) 0.25-3-85.5 % suppository Place rectally.    . traMADol (ULTRAM) 50 MG tablet Take 1 tablet (50 mg total) by mouth every 6 (six) hours as needed (for pain.). (Patient taking differently: Take 100 mg by mouth 2 (two) times daily. ) 30 tablet 0  . venlafaxine XR (EFFEXOR-XR) 75 MG 24 hr capsule Take 75 mg by mouth daily with breakfast.     No current facility-administered medications for this visit.     PHYSICAL  EXAMINATION: ECOG PERFORMANCE STATUS: 0 - Asymptomatic  BP 126/84 (BP Location: Left Arm, Patient Position: Sitting)   Pulse 78   Temp 97.6 F (36.4 C) (Tympanic)   Resp 16   Wt 173 lb (78.5 kg)   BMI 26.30 kg/m   Filed Weights   08/14/18 1049  Weight: 173 lb (78.5 kg)    Physical Exam  Constitutional: She is oriented to person, place, and time and well-developed, well-nourished, and in no distress.  She is accompanied by daughter.  She is walking herself.  HENT:  Head: Normocephalic and atraumatic.  Mouth/Throat: Oropharynx is clear and moist. No oropharyngeal exudate.  Eyes: Pupils are equal, round, and reactive to light.  Neck: Normal range of motion. Neck supple.  Cardiovascular: Normal rate and regular rhythm.  Pulmonary/Chest: No respiratory distress. She has no wheezes.  Abdominal: Soft. Bowel sounds are normal. She exhibits no distension and no mass. There is no tenderness. There is no rebound and no guarding.  Colostomy in place.  Musculoskeletal: Normal range of motion. She exhibits no edema or tenderness.  Pain with extension of the hip-on the right side.  Neurological: She is alert and oriented to person, place, and time.  Skin: Skin is warm.  Psychiatric: Affect normal.      LABORATORY DATA:  I have reviewed the data as listed    Component Value Date/Time   NA 139 08/14/2018 1009   NA 140 03/08/2017 1223   NA 144 07/25/2014 0444   K 3.3 (L) 08/14/2018 1009   K 3.7 07/25/2014 0444   CL 101 08/14/2018 1009   CL 107 07/25/2014 0444   CO2 26 08/14/2018 1009   CO2 30 07/25/2014 0444   GLUCOSE 170 (H) 08/14/2018 1009   GLUCOSE 87 07/25/2014 0444   BUN 12 08/14/2018 1009   BUN 13 03/08/2017 1223   BUN 8 07/25/2014 0444   CREATININE 0.70 08/14/2018 1009   CREATININE 0.69 07/25/2014 0444   CALCIUM 9.3 08/14/2018 1009   CALCIUM 8.4 (L) 07/25/2014 0444   PROT 7.6 08/14/2018 1009   PROT 6.8 03/08/2017 1223   PROT 6.5 07/25/2014 0444   ALBUMIN 3.8  08/14/2018 1009   ALBUMIN 4.2 03/08/2017 1223   ALBUMIN 2.6 (L) 07/25/2014 0444   AST 26 08/14/2018 1009   AST 96 (H) 07/25/2014 0444   ALT 16 08/14/2018 1009   ALT 123 (H) 07/25/2014 0444   ALKPHOS 130 (H) 08/14/2018 1009   ALKPHOS 189 (H) 07/25/2014 0444   BILITOT 0.3 08/14/2018 1009   BILITOT 0.2 03/08/2017 1223   BILITOT 2.0 (  H) 07/25/2014 0444   GFRNONAA >60 08/14/2018 1009   GFRNONAA >60 07/25/2014 0444   GFRAA >60 08/14/2018 1009   GFRAA >60 07/25/2014 0444    No results found for: SPEP, UPEP  Lab Results  Component Value Date   WBC 7.7 08/14/2018   NEUTROABS 5.8 08/14/2018   HGB 13.3 08/14/2018   HCT 39.6 08/14/2018   MCV 90.3 08/14/2018   PLT 183 08/14/2018      Chemistry      Component Value Date/Time   NA 139 08/14/2018 1009   NA 140 03/08/2017 1223   NA 144 07/25/2014 0444   K 3.3 (L) 08/14/2018 1009   K 3.7 07/25/2014 0444   CL 101 08/14/2018 1009   CL 107 07/25/2014 0444   CO2 26 08/14/2018 1009   CO2 30 07/25/2014 0444   BUN 12 08/14/2018 1009   BUN 13 03/08/2017 1223   BUN 8 07/25/2014 0444   CREATININE 0.70 08/14/2018 1009   CREATININE 0.69 07/25/2014 0444      Component Value Date/Time   CALCIUM 9.3 08/14/2018 1009   CALCIUM 8.4 (L) 07/25/2014 0444   ALKPHOS 130 (H) 08/14/2018 1009   ALKPHOS 189 (H) 07/25/2014 0444   AST 26 08/14/2018 1009   AST 96 (H) 07/25/2014 0444   ALT 16 08/14/2018 1009   ALT 123 (H) 07/25/2014 0444   BILITOT 0.3 08/14/2018 1009   BILITOT 0.2 03/08/2017 1223   BILITOT 2.0 (H) 07/25/2014 0444       RADIOGRAPHIC STUDIES: I have personally reviewed the radiological images as listed and agreed with the findings in the report. No results found.   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  #Clinically no evidence of recurrence; stable.   Await CEA from today.  # right hip pain-worsening;  Esp with extension- referral to [facility].    #Frequent UTIs-question etiology; stable.  CT urogram reviewed question soft tissue thickening of the right proximal ureter; awaiting cystoscopy with urology.  # folllow up in 1month/CEA; referal to [facility].      Orders Placed This Encounter  Procedures  . CEA    Standing Status:   Future    Standing Expiration Date:   08/15/2019  . AMB referral to orthopedics    Referral Priority:   Routine    Referral Type:   Consultation    Referred to Provider:   Menz, Michael, MD    Requested Specialty:   Orthopedic Surgery    Number of Visits Requested:   1   All questions were answered. The patient knows to call the clinic with any problems, questions or concerns.      Govinda R Brahmanday, MD 08/14/2018 6:50 PM  

## 2018-08-14 NOTE — Progress Notes (Signed)
Patient complains of pain in back on lower right side, which causes a dull aching pain in right leg.  Patient also complains of swelling in ankles and feet.

## 2018-08-15 LAB — CEA: CEA: 6.6 ng/mL — ABNORMAL HIGH (ref 0.0–4.7)

## 2018-08-20 ENCOUNTER — Encounter: Payer: Self-pay | Admitting: General Surgery

## 2018-08-20 ENCOUNTER — Ambulatory Visit (INDEPENDENT_AMBULATORY_CARE_PROVIDER_SITE_OTHER): Payer: BLUE CROSS/BLUE SHIELD | Admitting: General Surgery

## 2018-08-20 VITALS — BP 140/78 | HR 84 | Resp 14 | Wt 172.0 lb

## 2018-08-20 DIAGNOSIS — L02215 Cutaneous abscess of perineum: Secondary | ICD-10-CM

## 2018-08-20 DIAGNOSIS — C20 Malignant neoplasm of rectum: Secondary | ICD-10-CM

## 2018-08-20 NOTE — Progress Notes (Signed)
Patient ID: Rachel [name], female   DOB: [birth year], 62 y.o.   MRN: [id number]  Chief Complaint  Patient presents with  . Follow-up     pelvic abscess,rectal ca s/p drainage done 04-15-18    HPI Rachel [name] is a 62 y.o. female.   Following up on pelvic abscess,rectal ca s/p drainage done 04-15-18. Patient stated that she is doing well and with no pain. Patient has an appointment to be seen by the orthopedic for her right hip pain. HPI  Past Medical History:  Diagnosis Date  . Anginal pain ([place])   . Arthritis   . Body mass index (BMI) of 24.0-24.9 in adult   . Cardiovascular disease   . Colostomy present ([place])   . Current every day smoker    a. ~35 years - > has cut down to < 3/4 ppd  . Headache   . History of rectal cancer   . History of UTI   . Hypertension   . Midsternal chest pain    a. 05/2014 Stress Echo: Ex time: 8:09, ECG w/o acute changes, EF nl with possible mid and apical anterior HK-->cath recommended.  . Port-A-Cath in place    RIGHT SIDE  . Rectal cancer ([place]) 2017  . Stroke ([place])    07/2016 tia no neurology followup now seen by pcp  . Syncope and collapse   . Thyroid nodule 2018   FINDINGS CONSISTENT WITH A HURTHLE CELL LESION AND/OR NEOPLASM (BETHESDA CATEGORY IV).    Past Surgical History:  Procedure Laterality Date  . ABDOMINAL HYSTERECTOMY    . ABDOMINAL PERINEAL BOWEL RESECTION N/A 03/16/2017   Procedure: ABDOMINAL PERINEAL RESECTION;  Surgeon: Christene Lye, MD;  Location: [facility] ORS;  Service: General;  Laterality: N/A;  . APPENDECTOMY  03/16/2017   Procedure: APPENDECTOMY;  Surgeon: Christene Lye, MD;  Location: [facility] ORS;  Service: General;;  . BACK SURGERY     l 3,4,5  . BIOPSY THYROID Right 11/16/2016   FINDINGS CONSISTENT WITH A HURTHLE CELL LESION AND/OR NEOPLASM (BETHESDA CATEGORY IV).  [name] CHOLECYSTECTOMY    . COLONOSCOPY WITH PROPOFOL N/A 08/15/2016   Procedure: COLONOSCOPY WITH PROPOFOL;  Surgeon: Lollie Sails, MD;  Location:  [facility] ENDOSCOPY;  Service: Endoscopy;  Laterality: N/A;  . COLONOSCOPY WITH PROPOFOL N/A 11/08/2016   Procedure: COLONOSCOPY WITH PROPOFOL;  Surgeon: Christene Lye, MD;  Location: [facility] ENDOSCOPY;  Service: Endoscopy;  Laterality: N/A;  . COLOSTOMY N/A 11/23/2016   Procedure: SIGMOID COLOSTOMY;  Surgeon: Christene Lye, MD;  Location: [facility] ORS;  Service: General;  Laterality: N/A;  . ERCP    . INCISION AND DRAINAGE PERIRECTAL ABSCESS N/A 04/15/2018   Procedure: RECTAL ABSCESS;  Surgeon: Robert Bellow, MD;  Location: [facility] ORS;  Service: General;  Laterality: N/A;  . LAPAROSCOPY N/A 11/23/2016   Procedure: LAPAROSCOPY DIAGNOSTIC;  Surgeon: Christene Lye, MD;  Location: [facility] ORS;  Service: General;  Laterality: N/A;  . PARTIAL HYSTERECTOMY    . PORTACATH PLACEMENT N/A 08/18/2016   Procedure: INSERTION PORT-A-CATH;  Surgeon: Christene Lye, MD;  Location: [facility] ORS;  Service: General;  Laterality: N/A;  . THYROID LOBECTOMY Right 11/05/2017   Procedure: THYROID LOBECTOMY;  Surgeon: Christene Lye, MD;  Location: [facility] ORS;  Service: General;  Laterality: Right;    Family History  Problem Relation Age of Onset  . Breast cancer Mother   . Hyperlipidemia Father   . Heart disease Paternal Grandmother   . Stroke Maternal Grandfather   .  Prostate cancer Brother     Social History Social History   Tobacco Use  . Smoking status: Former Smoker    Packs/day: 0.25    Years: 35.00    Pack years: 8.75    Types: Cigarettes    Last attempt to quit: 08/11/2016    Years since quitting: 2.0  . Smokeless tobacco: Never Used  Substance Use Topics  . Alcohol use: No  . Drug use: No    Allergies  Allergen Reactions  . Prednisone Other (See Comments)    BLISTERS IN MOUTH AND ON TONGUE    Current Outpatient Medications  Medication Sig Dispense Refill  . aspirin EC 81 MG tablet Take 81 mg by mouth daily.    [name] docusate sodium (COLACE) 100 MG capsule Take 100 mg  by mouth daily as needed.     . lidocaine-prilocaine (EMLA) cream Apply 1 application topically as needed (for port access).    [name] lisinopril (PRINIVIL,ZESTRIL) 10 MG tablet     . nitroGLYCERIN (NITROSTAT) 0.4 MG SL tablet Place 1 tablet (0.4 mg total) under the tongue every 5 (five) minutes as needed for chest pain. 25 tablet 6  . phenazopyridine (PYRIDIUM) 200 MG tablet TAKE 1 TABLET BY MOUTH THREE TIMES A DAY AFTER MEALS AS NEEDED  0  . traMADol (ULTRAM) 50 MG tablet Take 1 tablet (50 mg total) by mouth every 6 (six) hours as needed (for pain.). (Patient taking differently: Take 100 mg by mouth 2 (two) times daily. ) 30 tablet 0  . venlafaxine XR (EFFEXOR-XR) 75 MG 24 hr capsule Take 75 mg by mouth daily with breakfast.     No current facility-administered medications for this visit.     Review of Systems Review of Systems  Constitutional: Negative.   Respiratory: Negative.   Cardiovascular: Negative.     Blood pressure 140/78, pulse 84, resp. rate 14, weight 172 lb (78 kg), SpO2 97 %.  Physical Exam Physical Exam  Constitutional: She is oriented to person, place, and time. She appears well-developed and well-nourished.  Cardiovascular: Intact distal pulses.  Edema lower extremity. Varicose veins.   Abdominal: Soft. Normal appearance.  Ostomy intact  Genitourinary:     Musculoskeletal:       Legs: Neurological: She is alert and oriented to person, place, and time.  Skin: Skin is warm and dry.  Psychiatric: She has a normal mood and affect. Her behavior is normal.    Data Reviewed June 17, 2018 PET/CT reviewed.  Assessment    No evidence of recurrent tumor.    Plan    The patient seems to have clear the chronic granulation tissue in the perineum post operative debridement.    Colonoscopy per Dr. Donnella Sham. Follow up in 6 months.  HPI, Physical Exam, Assessment and Plan have been scribed under the direction and in the presence of Robert Bellow, MD. Wayna Chalet, CMA  I have completed the exam and reviewed the above documentation for accuracy and completeness.  I agree with the above.  [employer] has been used and any errors in dictation or transcription are unintentional.  Hervey Ard, M.D., F.A.C.S. . Forest Gleason Kelin Borum 08/22/2018, 6:24 PM

## 2018-08-20 NOTE — Patient Instructions (Addendum)
Colonoscopy per Dr. Donnella Sham. Follow up in 6 months.

## 2018-08-21 ENCOUNTER — Ambulatory Visit (INDEPENDENT_AMBULATORY_CARE_PROVIDER_SITE_OTHER): Payer: BLUE CROSS/BLUE SHIELD | Admitting: Urology

## 2018-08-21 ENCOUNTER — Encounter: Payer: Self-pay | Admitting: Urology

## 2018-08-21 VITALS — BP 120/78 | HR 82 | Ht 68.0 in | Wt 173.0 lb

## 2018-08-21 DIAGNOSIS — R3129 Other microscopic hematuria: Secondary | ICD-10-CM

## 2018-08-21 LAB — URINALYSIS, COMPLETE
Bilirubin, UA: NEGATIVE
GLUCOSE, UA: NEGATIVE
LEUKOCYTES UA: NEGATIVE
NITRITE UA: NEGATIVE
Protein, UA: NEGATIVE
SPEC GRAV UA: 1.02 (ref 1.005–1.030)
Urobilinogen, Ur: 0.2 mg/dL (ref 0.2–1.0)
pH, UA: 5.5 (ref 5.0–7.5)

## 2018-08-21 LAB — MICROSCOPIC EXAMINATION: WBC, UA: NONE SEEN /hpf (ref 0–5)

## 2018-08-21 MED ORDER — LIDOCAINE HCL URETHRAL/MUCOSAL 2 % EX GEL
1.0000 "application " | Freq: Once | CUTANEOUS | Status: AC
  Administered 2018-08-21: 1 via URETHRAL

## 2018-08-21 NOTE — Progress Notes (Signed)
Cystoscopy Procedure Note:  Indication: Microscopic hematuria  After informed consent and discussion of the procedure and its risks, Rachel [name] was positioned and prepped in the standard fashion. Cystoscopy was performed with a flexible cystoscope. The urethra, bladder neck and entire bladder was visualized in a standard fashion. The mucosa was normal throughout.The ureteral orifices were visualized in their normal location and orientation. Cytology sent.  Imaging: I personally reviewed the CT urogram from 08/05/2018.  On my read I do not appreciate any significant hydronephrosis or filling defects in the delayed phase.  No solid renal masses.  Findings: 1. Normal cystoscopy  Assessment and Plan: Follow up cytology, is positive consider diagnostic right URS/biopsy Continue follow up with McGowan, PA re: OAB symptoms  Nickolas Madrid, MD

## 2018-08-27 ENCOUNTER — Other Ambulatory Visit: Payer: Self-pay | Admitting: Urology

## 2018-08-30 ENCOUNTER — Other Ambulatory Visit: Payer: Self-pay | Admitting: Urology

## 2018-08-30 ENCOUNTER — Telehealth: Payer: Self-pay | Admitting: *Deleted

## 2018-08-30 MED ORDER — MIRABEGRON ER 25 MG PO TB24: 25.0000 mg | ORAL_TABLET | Freq: Every day | ORAL | 3 refills | Status: DC

## 2018-08-30 NOTE — Telephone Encounter (Signed)
Notified patient as instructed, patient pleased. Discussed follow-up appointments, patient agrees  

## 2018-08-30 NOTE — Telephone Encounter (Signed)
-----   Message from Billey Co, MD sent at 08/29/2018  9:34 AM EDT ----- Regarding: negative cytology Please let her know her cytology was negative.  There were no worrisome cells seen in the urine.  Keep scheduled follow-up with Larene Beach to discuss overactive bladder.  Nickolas Madrid, MD 08/29/2018   ----- Message ----- From: Noralyn Pick Sent: 08/27/2018   3:19 PM EDT To: Billey Co, MD

## 2018-08-30 NOTE — Telephone Encounter (Signed)
Patient states she is out of the sample that you give her .Myrbetriq 25 mg samples,   She would like a prescription.

## 2018-08-30 NOTE — Progress Notes (Unsigned)
I have sent a script to Myrbetriq to her pharmacy.

## 2018-09-11 ENCOUNTER — Inpatient Hospital Stay: Payer: BLUE CROSS/BLUE SHIELD | Attending: Internal Medicine

## 2018-09-11 ENCOUNTER — Inpatient Hospital Stay (HOSPITAL_BASED_OUTPATIENT_CLINIC_OR_DEPARTMENT_OTHER): Payer: BLUE CROSS/BLUE SHIELD | Admitting: Internal Medicine

## 2018-09-11 VITALS — BP 125/82 | HR 73 | Temp 97.3°F | Resp 16 | Wt 171.0 lb

## 2018-09-11 DIAGNOSIS — C775 Secondary and unspecified malignant neoplasm of intrapelvic lymph nodes: Secondary | ICD-10-CM | POA: Insufficient documentation

## 2018-09-11 DIAGNOSIS — C20 Malignant neoplasm of rectum: Secondary | ICD-10-CM | POA: Diagnosis not present

## 2018-09-11 DIAGNOSIS — R970 Elevated carcinoembryonic antigen [CEA]: Secondary | ICD-10-CM | POA: Diagnosis not present

## 2018-09-11 DIAGNOSIS — Z7982 Long term (current) use of aspirin: Secondary | ICD-10-CM | POA: Diagnosis not present

## 2018-09-11 DIAGNOSIS — Z79899 Other long term (current) drug therapy: Secondary | ICD-10-CM | POA: Diagnosis not present

## 2018-09-11 DIAGNOSIS — Z8673 Personal history of transient ischemic attack (TIA), and cerebral infarction without residual deficits: Secondary | ICD-10-CM

## 2018-09-11 DIAGNOSIS — Z87440 Personal history of urinary (tract) infections: Secondary | ICD-10-CM

## 2018-09-11 DIAGNOSIS — Z87891 Personal history of nicotine dependence: Secondary | ICD-10-CM

## 2018-09-11 DIAGNOSIS — Z7902 Long term (current) use of antithrombotics/antiplatelets: Secondary | ICD-10-CM | POA: Insufficient documentation

## 2018-09-11 DIAGNOSIS — M25551 Pain in right hip: Secondary | ICD-10-CM | POA: Insufficient documentation

## 2018-09-11 DIAGNOSIS — I10 Essential (primary) hypertension: Secondary | ICD-10-CM | POA: Diagnosis not present

## 2018-09-11 MED ORDER — HEPARIN SOD (PORK) LOCK FLUSH 100 UNIT/ML IV SOLN
500.0000 [IU] | Freq: Once | INTRAVENOUS | Status: AC
  Administered 2018-09-11: 500 [IU] via INTRAVENOUS

## 2018-09-11 MED ORDER — SODIUM CHLORIDE 0.9% FLUSH
10.0000 mL | Freq: Once | INTRAVENOUS | Status: AC
  Administered 2018-09-11: 10 mL via INTRAVENOUS
  Filled 2018-09-11: qty 10

## 2018-09-11 NOTE — Assessment & Plan Note (Addendum)
#   Rectal  Cancer- stage III; currently under surveillance; however recent CEA elevated at 6; PET scan July 2019- for any recurrent disease.  #Clinically no evidence of recurrence; stable.  However CEA SEP 2019- 6.6/STABLE; colo- Dr.Byrnett Nov 11th.   # right hip pain- Esp with extension- July 2019- PET NED. awaiting eval with [facility]. STABLE.   #Frequent UTIs/pelvic cramps-question etiology;  cyctsocopy- wnl [Dr.Sinski]; improved on Myrbetriq.  # Screening-patient is recommended mammogram screening.  I agree with PCPs recommendations.  # folllow up in 2 months/port flush/cbc/cmp/cea;   Cc:

## 2018-09-11 NOTE — Progress Notes (Signed)
[facility] OFFICE PROGRESS NOTE  Patient Care Team: Lynnell Jude, MD as PCP - General (Family Medicine) Cammie Sickle, MD as Consulting Physician (Internal Medicine) Noreene Filbert, MD as Referring Physician (Radiation Oncology) Clent Jacks, RN as Oncology Nurse Navigator Christene Lye, MD (General Surgery)  Cancer Staging No matching staging information was found for the patient.   Oncology History   # SEP 2017- RECTAL CA mod diff adeno; STAGE III [no EUS; positive ~62m LN; Dr.sankar] CEA-7; Sep 21st START 5FU-RT [finished NOV 2017]; April 7th APR- 2018- [id number] [5LN]; positive for LVI/PNI [poor response to chemo]; Margins-Negative.  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  female pleasant patient above history of stage III rectal cancer status post APR is here for follow-up.  In the interim patient was evaluated by urology with cystoscopy for microscopic hematuria and ongoing pelvic pain/frequent UTIs.  Cystoscopy was normal.  Patient was started on Myrbetriq.  Patient notes to have improvement of her pelvic pain/cramps.  Patient continues to have right-sided hip pain with radiating to the right lower extremity.  Progressive getting worse.  Also walking with a  limp.  Awaiting evaluation with orthopedics next week.  Patient continues to complain of mild to moderate fatigue.  Review of Systems  Constitutional: Positive for malaise/fatigue. Negative for chills, diaphoresis, fever and weight loss.  HENT: Negative for nosebleeds and sore throat.   Eyes: Negative for double vision.  Respiratory: Negative for cough, hemoptysis, sputum production, shortness of breath and wheezing.   Cardiovascular: Positive for leg swelling. Negative for chest pain, palpitations and orthopnea.  Gastrointestinal: Negative for abdominal pain, blood in stool, constipation, diarrhea, heartburn, melena, nausea and vomiting.  Genitourinary: Negative for dysuria, frequency and urgency.  Musculoskeletal: Positive for joint pain (hip pain right/back). Negative for back pain.  Skin: Negative.  Negative for itching and rash.  Neurological: Positive for tingling. Negative for dizziness, focal weakness, weakness and headaches.  Endo/Heme/Allergies: Does not bruise/bleed easily.  Psychiatric/Behavioral: Negative for depression. The patient is not nervous/anxious and does not have insomnia.       PAST MEDICAL HISTORY :  Past Medical History:  Diagnosis Date  . Anginal pain ([place]   . Arthritis   . Body mass index (BMI) of 24.0-24.9 in adult   . Cardiovascular disease   . Col[place] present ([place]   . Current every day smoker    a. ~35 years - > has cut down to < 3/4 ppd  . Headache   . History of rectal cancer   . History of UTI   . Hypertension   . Midsternal chest pain    a. 05/2014 Stress Echo: Ex time: 8:09, ECG w/o acute changes, EF nl with possible mid and apical anterior HK-->cath recommended.  . Port-A-Cath in place    RIGHT SIDE  . Rectal cancer (  [place]) 2017  . Stroke ([place])    07/2016 tia no neurology followup now seen by pcp  . Syncope and collapse   . Thyroid nodule 2018   FINDINGS CONSISTENT WITH A HURTHLE CELL LESION AND/OR NEOPLASM (BETHESDA CATEGORY IV).     PAST SURGICAL HISTORY :   Past Surgical History:  Procedure Laterality Date  . ABDOMINAL HYSTERECTOMY    . ABDOMINAL PERINEAL BOWEL RESECTION N/A 03/16/2017   Procedure: ABDOMINAL PERINEAL RESECTION;  Surgeon: Christene Lye, MD;  Location: [facility] ORS;  Service: General;  Laterality: N/A;  . APPENDECTOMY  03/16/2017   Procedure: APPENDECTOMY;  Surgeon: Christene Lye, MD;  Location: [facility] ORS;  Service: General;;  . BACK SURGERY     l 3,4,5  . BIOPSY THYROID Right 11/16/2016   FINDINGS CONSISTENT WITH A HURTHLE CELL LESION AND/OR NEOPLASM (BETHESDA CATEGORY IV).  [name] CHOLECYSTECTOMY    . COLONOSCOPY WITH PROPOFOL N/A 08/15/2016   Procedure: COLONOSCOPY WITH PROPOFOL;  Surgeon: Lollie Sails, MD;  Location: [facility] ENDOSCOPY;  Service: Endoscopy;  Laterality: N/A;  . COLONOSCOPY WITH PROPOFOL N/A 11/08/2016   Procedure: COLONOSCOPY WITH PROPOFOL;  Surgeon: Christene Lye, MD;  Location: [facility] ENDOSCOPY;  Service: Endoscopy;  Laterality: N/A;  . COLOSTOMY N/A 11/23/2016   Procedure: SIGMOID COLOSTOMY;  Surgeon: Christene Lye, MD;  Location: [facility] ORS;  Service: General;  Laterality: N/A;  . ERCP    . INCISION AND DRAINAGE PERIRECTAL ABSCESS N/A 04/15/2018   Procedure: RECTAL ABSCESS;  Surgeon: Robert Bellow, MD;  Location: [facility] ORS;  Service: General;  Laterality: N/A;  . LAPAROSCOPY N/A 11/23/2016   Procedure: LAPAROSCOPY DIAGNOSTIC;  Surgeon: Christene Lye, MD;  Location: [facility] ORS;  Service: General;  Laterality: N/A;  . PARTIAL HYSTERECTOMY    . PORTACATH PLACEMENT N/A 08/18/2016   Procedure: INSERTION PORT-A-CATH;  Surgeon: Christene Lye, MD;  Location: [facility] ORS;  Service: General;  Laterality: N/A;  . THYROID LOBECTOMY Right 11/05/2017   Procedure: THYROID LOBECTOMY;  Surgeon: Christene Lye, MD;  Location: [facility] ORS;  Service: General;  Laterality: Right;    FAMILY HISTORY :   Family History  Problem Relation Age of Onset  . Breast  cancer Mother   . Hyperlipidemia Father   . Heart disease Paternal Grandmother   . Stroke Maternal Grandfather   . Prostate cancer Brother     SOCIAL HISTORY:   Social History   Tobacco Use  . Smoking status: Former Smoker    Packs/day: 0.25    Years: 35.00    Pack years: 8.75    Types: Cigarettes    Last attempt to quit: 08/11/2016    Years since quitting: 2.0  . Smokeless tobacco: Never Used  Substance Use Topics  . Alcohol use: No  . Drug use: No    ALLERGIES:  is allergic to prednisone.  MEDICATIONS:  Current Outpatient Medications  Medication Sig Dispense Refill  . aspirin EC 81 MG tablet Take 81 mg by mouth daily.    [name] docusate sodium (COLACE) 100 MG capsule Take 100 mg by mouth daily as needed.     . lidocaine-prilocaine (EMLA) cream Apply 1 application topically as needed (for port access).    [name] lisinopril (PRINIVIL,ZESTRIL) 10 MG tablet     . mirabegron ER (MYRBETRIQ) 25 MG TB24 tablet Take 1 tablet (25 mg total) by mouth daily. 30 tablet 3  . nitroGLYCERIN (NITROSTAT) 0.4 MG SL tablet Place 1 tablet (0.4 mg total) under the tongue every 5 (  five) minutes as needed for chest pain. 25 tablet 6  . phenazopyridine (PYRIDIUM) 200 MG tablet TAKE 1 TABLET BY MOUTH THREE TIMES A DAY AFTER MEALS AS NEEDED  0  . traMADol (ULTRAM) 50 MG tablet Take 1 tablet (50 mg total) by mouth every 6 (six) hours as needed (for pain.). (Patient taking differently: Take 100 mg by mouth 2 (two) times daily. ) 30 tablet 0  . venlafaxine XR (EFFEXOR-XR) 75 MG 24 hr capsule Take 75 mg by mouth daily with breakfast.     No current facility-administered medications for this visit.     PHYSICAL EXAMINATION: ECOG PERFORMANCE STATUS: 0 - Asymptomatic  BP 125/82   Pulse 73   Temp (!) 97.3 F (36.3 C) (Tympanic)   Resp 16   Wt 171 lb (77.6 kg)   BMI 26.00 kg/m   Filed Weights   09/11/18 0951  Weight: 171 lb (77.6 kg)    Physical Exam  Constitutional: She is oriented to person, place,  and time and well-developed, well-nourished, and in no distress.  She is alone.  She is walking herself.  HENT:  Head: Normocephalic and atraumatic.  Mouth/Throat: Oropharynx is clear and moist. No oropharyngeal exudate.  Eyes: Pupils are equal, round, and reactive to light.  Neck: Normal range of motion. Neck supple.  Cardiovascular: Normal rate and regular rhythm.  Pulmonary/Chest: No respiratory distress. She has no wheezes.  Abdominal: Soft. Bowel sounds are normal. She exhibits no distension and no mass. There is no tenderness. There is no rebound and no guarding.  Colostomy in place.  Musculoskeletal: Normal range of motion. She exhibits no edema or tenderness.  Pain with extension of the hip-on the right side.  Neurological: She is alert and oriented to person, place, and time.  Skin: Skin is warm.  Psychiatric: Affect normal.      LABORATORY DATA:  I have reviewed the data as listed    Component Value Date/Time   NA 139 08/14/2018 1009   NA 140 03/08/2017 1223   NA 144 07/25/2014 0444   K 3.3 (L) 08/14/2018 1009   K 3.7 07/25/2014 0444   CL 101 08/14/2018 1009   CL 107 07/25/2014 0444   CO2 26 08/14/2018 1009   CO2 30 07/25/2014 0444   GLUCOSE 170 (H) 08/14/2018 1009   GLUCOSE 87 07/25/2014 0444   BUN 12 08/14/2018 1009   BUN 13 03/08/2017 1223   BUN 8 07/25/2014 0444   CREATININE 0.70 08/14/2018 1009   CREATININE 0.69 07/25/2014 0444   CALCIUM 9.3 08/14/2018 1009   CALCIUM 8.4 (L) 07/25/2014 0444   PROT 7.6 08/14/2018 1009   PROT 6.8 03/08/2017 1223   PROT 6.5 07/25/2014 0444   ALBUMIN 3.8 08/14/2018 1009   ALBUMIN 4.2 03/08/2017 1223   ALBUMIN 2.6 (L) 07/25/2014 0444   AST 26 08/14/2018 1009   AST 96 (H) 07/25/2014 0444   ALT 16 08/14/2018 1009   ALT 123 (H) 07/25/2014 0444   ALKPHOS 130 (H) 08/14/2018 1009   ALKPHOS 189 (H) 07/25/2014 0444   BILITOT 0.3 08/14/2018 1009   BILITOT 0.2 03/08/2017 1223   BILITOT 2.0 (H) 07/25/2014 0444   GFRNONAA >60  08/14/2018 1009   GFRNONAA >60 07/25/2014 0444   GFRAA >60 08/14/2018 1009   GFRAA >60 07/25/2014 0444    No results found for: SPEP, UPEP  Lab Results  Component Value Date   WBC 7.7 08/14/2018   NEUTROABS 5.8 08/14/2018   HGB 13.3 08/14/2018   HCT  39.6 08/14/2018   MCV 90.3 08/14/2018   PLT 183 08/14/2018      Chemistry      Component Value Date/Time   NA 139 08/14/2018 1009   NA 140 03/08/2017 1223   NA 144 07/25/2014 0444   K 3.3 (L) 08/14/2018 1009   K 3.7 07/25/2014 0444   CL 101 08/14/2018 1009   CL 107 07/25/2014 0444   CO2 26 08/14/2018 1009   CO2 30 07/25/2014 0444   BUN 12 08/14/2018 1009   BUN 13 03/08/2017 1223   BUN 8 07/25/2014 0444   CREATININE 0.70 08/14/2018 1009   CREATININE 0.69 07/25/2014 0444      Component Value Date/Time   CALCIUM 9.3 08/14/2018 1009   CALCIUM 8.4 (L) 07/25/2014 0444   ALKPHOS 130 (H) 08/14/2018 1009   ALKPHOS 189 (H) 07/25/2014 0444   AST 26 08/14/2018 1009   AST 96 (H) 07/25/2014 0444   ALT 16 08/14/2018 1009   ALT 123 (H) 07/25/2014 0444   BILITOT 0.3 08/14/2018 1009   BILITOT 0.2 03/08/2017 1223   BILITOT 2.0 (H) 07/25/2014 0444       RADIOGRAPHIC STUDIES: I have personally reviewed the radiological images as listed and agreed with the findings in the report. No results found.   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  #Clinically no evidence of recurrence; stable.  However CEA SEP 2019- 6.6/STABLE; colo- Dr.Byrnett Nov 11th.   # right hip pain- Esp with extension- July 2019- PET NED. awaiting eval with [facility] or[facility]. STABLE.   #Frequent UTIs/pelvic cramps-question etiology;  cyctsocopy- wnl [Dr.Sinski]; improved on Myrbetriq.  # Screening-patient is recommended mammogram screening.  I agree with PCPs recommendations.  #  folllow up in 2 months/port flush/cbc/cmp/cea;   Cc:    Orders Placed This Encounter  Procedures  . CBC with Differential    Standing Status:   Future    Standing Expiration Date:   09/12/2019  . Comprehensive metabolic panel    Standing Status:   Future    Standing Expiration Date:   09/12/2019  . CEA    Standing Status:   Future    Standing Expiration Date:   09/12/2019   All questions were answered. The patient knows to call the clinic with any problems, questions or concerns.      Cammie Sickle, MD 09/11/2018 10:22 AM

## 2018-09-12 LAB — CEA: CEA1: 5.5 ng/mL — AB (ref 0.0–4.7)

## 2018-10-11 ENCOUNTER — Encounter: Payer: Self-pay | Admitting: Family Medicine

## 2018-10-18 ENCOUNTER — Encounter: Payer: Self-pay | Admitting: *Deleted

## 2018-10-21 ENCOUNTER — Ambulatory Visit: Payer: BLUE CROSS/BLUE SHIELD | Admitting: Anesthesiology

## 2018-10-21 ENCOUNTER — Encounter
Admission: RE | Disposition: A | Discharge status: Home / Self Care | Payer: Self-pay | Source: Ambulatory Visit | Attending: Gastroenterology

## 2018-10-21 ENCOUNTER — Encounter: Payer: Self-pay | Admitting: *Deleted

## 2018-10-21 ENCOUNTER — Ambulatory Visit
Admission: RE | Admit: 2018-10-21 | Discharge: 2018-10-21 | Disposition: A | Discharge status: Home / Self Care | Payer: BLUE CROSS/BLUE SHIELD | Source: Ambulatory Visit | Attending: Gastroenterology | Admitting: Gastroenterology

## 2018-10-21 DIAGNOSIS — I10 Essential (primary) hypertension: Secondary | ICD-10-CM | POA: Diagnosis not present

## 2018-10-21 DIAGNOSIS — I69392 Facial weakness following cerebral infarction: Secondary | ICD-10-CM | POA: Diagnosis not present

## 2018-10-21 DIAGNOSIS — Z5309 Procedure and treatment not carried out because of other contraindication: Secondary | ICD-10-CM | POA: Diagnosis present

## 2018-10-21 SURGERY — COLONOSCOPY WITH PROPOFOL
Anesthesia: General

## 2018-10-21 MED ORDER — PROPOFOL 500 MG/50ML IV EMUL
INTRAVENOUS | Status: AC
  Filled 2018-10-21: qty 50

## 2018-10-21 MED ORDER — SODIUM CHLORIDE 0.9 % IV SOLN: INTRAVENOUS | Status: DC

## 2018-10-21 NOTE — OR Nursing (Signed)
Patient Colonoscopy procedure canceled due to inadequate prep per Dr Gustavo Lah.  Patient will be notified by [facility] for reschedule date.  Patient verbalized understanding.

## 2018-10-21 NOTE — Anesthesia Preprocedure Evaluation (Signed)
Anesthesia Evaluation  Patient identified by MRN, date of birth, ID band Patient awake    Reviewed: Allergy & Precautions, NPO status , Patient's Chart, lab work & pertinent test results  History of Anesthesia Complications Negative for: history of anesthetic complications  Airway Mallampati: II       Dental  (+) Upper Dentures, Lower Dentures   Pulmonary neg sleep apnea, neg COPD, former smoker,           Cardiovascular hypertension, Pt. on medications + CAD  (-) CHF (-) dysrhythmias (-) Valvular Problems/Murmurs     Neuro/Psych CVA (facial droop), No Residual Symptoms    GI/Hepatic Neg liver ROS, neg GERD  ,  Endo/Other  neg diabetes  Renal/GU negative Renal ROS     Musculoskeletal   Abdominal   Peds  Hematology   Anesthesia Other Findings   Reproductive/Obstetrics                             Anesthesia Physical Anesthesia Plan  ASA: III  Anesthesia Plan: General   Post-op Pain Management:    Induction:   PONV Risk Score and Plan: 3 and Propofol infusion, TIVA and Treatment may vary due to age or medical condition  Airway Management Planned: Nasal Cannula  Additional Equipment:   Intra-op Plan:   Post-operative Plan:   Informed Consent: I have reviewed the patients History and Physical, chart, labs and discussed the procedure including the risks, benefits and alternatives for the proposed anesthesia with the patient or authorized representative who has indicated his/her understanding and acceptance.     Plan Discussed with:   Anesthesia Plan Comments:         Anesthesia Quick Evaluation

## 2018-10-22 NOTE — H&P (Signed)
Patient had presented yesterday for a colonoscopy through her ostomy site.  However the ostomy bag was filled with very obvious dark thick stool.  As such procedure could not be done.  Patient stated that she had problems with nausea did not tolerate the prep.  Will cancelled the procedure and my office will contact her to rearrange with alternate prep instructions.

## 2018-10-23 ENCOUNTER — Telehealth: Payer: Self-pay | Admitting: *Deleted

## 2018-10-23 NOTE — Telephone Encounter (Signed)
Left message for patient to call the office back to make an appointment

## 2018-10-23 NOTE — Telephone Encounter (Signed)
-----   Message from Robert Bellow, MD sent at 10/22/2018 11:00 AM EST ----- Regarding: FW: recurrent symptoms Please arrange an elective (not urgent) regarding rectal drainage.   ----- Message ----- From: Lollie Sails, MD Sent: 10/21/2018   1:26 PM EST To: Robert Bellow, MD Subject: recurrent symptoms                             I had discussed this patient with you, history of low resection and subsequent development of abscess to have worked with.  She was in today to have her colonoscopy through the ostomy unfortunately that could not be done due to very bad prep and difficulty with her prep routine.  I will be repeating that however, when I talked with her she said that she was starting to have a discharge again the region of the stump/orifice region.  I asked her if she had contacted you again and she had not.  Told her I would let you know that it may facilitate her being seen in that regard. Thank you.

## 2018-11-13 ENCOUNTER — Inpatient Hospital Stay (HOSPITAL_BASED_OUTPATIENT_CLINIC_OR_DEPARTMENT_OTHER): Payer: BLUE CROSS/BLUE SHIELD | Admitting: Internal Medicine

## 2018-11-13 ENCOUNTER — Inpatient Hospital Stay: Payer: BLUE CROSS/BLUE SHIELD | Attending: Internal Medicine

## 2018-11-13 ENCOUNTER — Encounter: Payer: Self-pay | Admitting: Internal Medicine

## 2018-11-13 ENCOUNTER — Other Ambulatory Visit: Payer: Self-pay

## 2018-11-13 VITALS — BP 105/69 | HR 88 | Temp 98.1°F | Resp 18 | Ht 66.0 in | Wt 177.6 lb

## 2018-11-13 DIAGNOSIS — Z79899 Other long term (current) drug therapy: Secondary | ICD-10-CM

## 2018-11-13 DIAGNOSIS — F17200 Nicotine dependence, unspecified, uncomplicated: Secondary | ICD-10-CM | POA: Insufficient documentation

## 2018-11-13 DIAGNOSIS — Z8673 Personal history of transient ischemic attack (TIA), and cerebral infarction without residual deficits: Secondary | ICD-10-CM

## 2018-11-13 DIAGNOSIS — C775 Secondary and unspecified malignant neoplasm of intrapelvic lymph nodes: Secondary | ICD-10-CM | POA: Insufficient documentation

## 2018-11-13 DIAGNOSIS — Z7902 Long term (current) use of antithrombotics/antiplatelets: Secondary | ICD-10-CM | POA: Insufficient documentation

## 2018-11-13 DIAGNOSIS — M25551 Pain in right hip: Secondary | ICD-10-CM

## 2018-11-13 DIAGNOSIS — I10 Essential (primary) hypertension: Secondary | ICD-10-CM | POA: Diagnosis not present

## 2018-11-13 DIAGNOSIS — Z7982 Long term (current) use of aspirin: Secondary | ICD-10-CM | POA: Insufficient documentation

## 2018-11-13 DIAGNOSIS — C20 Malignant neoplasm of rectum: Secondary | ICD-10-CM | POA: Diagnosis present

## 2018-11-13 DIAGNOSIS — Z95828 Presence of other vascular implants and grafts: Secondary | ICD-10-CM

## 2018-11-13 LAB — COMPREHENSIVE METABOLIC PANEL
ALT: 11 U/L (ref 0–44)
AST: 18 U/L (ref 15–41)
Albumin: 3.8 g/dL (ref 3.5–5.0)
Alkaline Phosphatase: 125 U/L (ref 38–126)
Anion gap: 15 (ref 5–15)
BUN: 18 mg/dL (ref 8–23)
CHLORIDE: 98 mmol/L (ref 98–111)
CO2: 28 mmol/L (ref 22–32)
CREATININE: 0.92 mg/dL (ref 0.44–1.00)
Calcium: 9.4 mg/dL (ref 8.9–10.3)
GFR calc non Af Amer: >60 mL/min (ref >60)
Glucose, Bld: 101 mg/dL — ABNORMAL HIGH (ref 70–99)
POTASSIUM: 3.4 mmol/L — AB (ref 3.5–5.1)
SODIUM: 141 mmol/L (ref 135–145)
Total Bilirubin: 0.4 mg/dL (ref 0.3–1.2)
Total Protein: 8.1 g/dL (ref 6.5–8.1)

## 2018-11-13 LAB — CBC WITH DIFFERENTIAL/PLATELET
Abs Immature Granulocytes: 0.03 10*3/uL (ref 0.00–0.07)
BASOS ABS: 0 10*3/uL (ref 0.0–0.1)
Basophils Relative: 0 %
EOS ABS: 0 10*3/uL (ref 0.0–0.5)
EOS PCT: 0 %
HEMATOCRIT: 38.2 % (ref 36.0–46.0)
HEMOGLOBIN: 12.4 g/dL (ref 12.0–15.0)
Immature Granulocytes: 0 %
LYMPHS PCT: 10 %
Lymphs Abs: 1 10*3/uL (ref 0.7–4.0)
MCH: 28.9 pg (ref 26.0–34.0)
MCHC: 32.5 g/dL (ref 30.0–36.0)
MCV: 89 fL (ref 80.0–100.0)
MONO ABS: 0.6 10*3/uL (ref 0.1–1.0)
Monocytes Relative: 6 %
NEUTROS ABS: 8.1 10*3/uL — AB (ref 1.7–7.7)
NRBC: 0 % (ref 0.0–0.2)
Neutrophils Relative %: 84 %
Platelets: 261 10*3/uL (ref 150–400)
RBC: 4.29 MIL/uL (ref 3.87–5.11)
RDW: 14.2 % (ref 11.5–15.5)
WBC: 9.8 10*3/uL (ref 4.0–10.5)

## 2018-11-13 MED ORDER — SODIUM CHLORIDE 0.9% FLUSH
10.0000 mL | Freq: Once | INTRAVENOUS | Status: AC
  Administered 2018-11-13: 10 mL via INTRAVENOUS
  Filled 2018-11-13: qty 10

## 2018-11-13 MED ORDER — HEPARIN SOD (PORK) LOCK FLUSH 100 UNIT/ML IV SOLN
500.0000 [IU] | Freq: Once | INTRAVENOUS | Status: AC
  Administered 2018-11-13: 500 [IU] via INTRAVENOUS

## 2018-11-13 MED ORDER — LIDOCAINE-PRILOCAINE 2.5-2.5 % EX CREA: 1.0000 "application " | TOPICAL_CREAM | CUTANEOUS | 3 refills | Status: DC | PRN

## 2018-11-13 NOTE — Assessment & Plan Note (Addendum)
#   Rectal  Cancer- stage III; currently under surveillance; CEA slightly elevated between 45.  PET scan July 2019- for any recurrent disease.  Clinically stable.  # Colonoscopy thru stoma-suboptimal second to stool/Dr.Skulskie; awaiting repeat colonoscopy.  #Urinary incontinence-status post evaluation with urology.  Defer to urology.  # right hip arthritis- on NSAIDs; will need for Hip replacement [Dr.Hooten].  Okay to proceed with arthroplasty from oncology standpoint.  # Screening-patient is recommended mammogram screening.  I agree with PCPs recommendations.  # DISPOSITION:  # port flush in 2 months # folllow up in 4 months/port flush/cbc/cmp/cea-Dr.B   Cc: PCP

## 2018-11-13 NOTE — Progress Notes (Signed)
[place] OFFICE PROGRESS NOTE  Patient Care Team: Lynnell Jude, MD as PCP - General (Family Medicine) Cammie Sickle, MD as Consulting Physician (Internal Medicine) Noreene Filbert, MD as Referring Physician (Radiation Oncology) Clent Jacks, RN as Oncology Nurse Navigator Christene Lye, MD (General Surgery)  Cancer Staging No matching staging information was found for the patient.   Oncology History   # SEP 2017- RECTAL CA mod diff adeno; STAGE III [no EUS; positive ~37m LN; Dr.sankar] CEA-7; Sep 21st START 5FU-RT [finished NOV 2017]; April 7th APR- 2018- [id number] [5LN]; positive for LVI/PNI [poor response to chemo]; Margins-Negative.  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  female pleasant patient above history of stage III rectal cancer status post APR is here for follow-up.  In the interim patient had a colonoscopy attempted to her stoma-however suboptimal because of stool  Overall patient is doing well.  Complains of mild fatigue.   Continues to have intermittent urinary incontinence followed by urology.  Also evaluated by orthopedics is recommended to have right hip replacement because of arthritis.  She wants to hold for now.   Review of Systems   Constitutional: Positive for malaise/fatigue. Negative for chills, diaphoresis, fever and weight loss.  HENT: Negative for nosebleeds and sore throat.   Eyes: Negative for double vision.  Respiratory: Negative for cough, hemoptysis, sputum production, shortness of breath and wheezing.   Cardiovascular: Negative for chest pain, palpitations and orthopnea.  Gastrointestinal: Negative for abdominal pain, blood in stool, constipation, diarrhea, heartburn, melena, nausea and vomiting.  Genitourinary: Negative for dysuria, frequency and urgency.  Musculoskeletal: Positive for joint pain (hip pain right/back). Negative for back pain.  Skin: Negative.  Negative for itching and rash.  Neurological: Positive for tingling. Negative for dizziness, focal weakness, weakness and headaches.  Endo/Heme/Allergies: Does not bruise/bleed easily.  Psychiatric/Behavioral: Negative for depression. The patient is not nervous/anxious and does not have insomnia.       PAST MEDICAL HISTORY :  Past Medical History:  Diagnosis Date  . Anginal pain ([facility]   . Arthritis   . Body mass index (BMI) of 24.0-24.9 in adult   . Cardiovascular disease   . [place] ([place]   . Current every day smoker    a. ~35 years - > has cut down to < 3/4 ppd  . Headache   . History of rectal cancer   . History of UTI   . Hypertension   . Midsternal chest pain    a. 05/2014 Stress Echo: Ex time: 8:09, ECG w/o acute changes, EF nl with possible mid and apical anterior HK-->cath recommended.  . Port-A-Cath in place    RIGHT SIDE  . Rectal cancer ([facility] [place]  . Stroke ([facility]    07/2016 tia no neurology followup now seen by pcp  . Syncope and collapse   .  Thyroid nodule 2018   FINDINGS CONSISTENT WITH A HURTHLE CELL LESION AND/OR NEOPLASM (BETHESDA CATEGORY IV).    PAST SURGICAL HISTORY :   Past Surgical History:  Procedure Laterality Date  . ABDOMINAL HYSTERECTOMY    . ABDOMINAL PERINEAL BOWEL RESECTION N/A 03/16/2017    Procedure: ABDOMINAL PERINEAL RESECTION;  Surgeon: Christene Lye, MD;  Location: [facility] ORS;  Service: General;  Laterality: N/A;  . APPENDECTOMY  03/16/2017   Procedure: APPENDECTOMY;  Surgeon: Christene Lye, MD;  Location: [facility] ORS;  Service: General;;  . BACK SURGERY     l 3,4,5  . BIOPSY THYROID Right 11/16/2016   FINDINGS CONSISTENT WITH A HURTHLE CELL LESION AND/OR NEOPLASM (BETHESDA CATEGORY IV).  [name] CHOLECYSTECTOMY    . COLONOSCOPY WITH PROPOFOL N/A 08/15/2016   Procedure: COLONOSCOPY WITH PROPOFOL;  Surgeon: Lollie Sails, MD;  Location: [facility] ENDOSCOPY;  Service: Endoscopy;  Laterality: N/A;  . COLONOSCOPY WITH PROPOFOL N/A 11/08/2016   Procedure: COLONOSCOPY WITH PROPOFOL;  Surgeon: Christene Lye, MD;  Location: [facility] ENDOSCOPY;  Service: Endoscopy;  Laterality: N/A;  . COLOSTOMY N/A 11/23/2016   Procedure: SIGMOID COLOSTOMY;  Surgeon: Christene Lye, MD;  Location: [facility] ORS;  Service: General;  Laterality: N/A;  . ERCP    . INCISION AND DRAINAGE PERIRECTAL ABSCESS N/A 04/15/2018   Procedure: RECTAL ABSCESS;  Surgeon: Robert Bellow, MD;  Location: [facility] ORS;  Service: General;  Laterality: N/A;  . LAPAROSCOPY N/A 11/23/2016   Procedure: LAPAROSCOPY DIAGNOSTIC;  Surgeon: Christene Lye, MD;  Location: [facility] ORS;  Service: General;  Laterality: N/A;  . PARTIAL HYSTERECTOMY    . PORTACATH PLACEMENT N/A 08/18/2016   Procedure: INSERTION PORT-A-CATH;  Surgeon: Christene Lye, MD;  Location: [facility] ORS;  Service: General;  Laterality: N/A;  . THYROID LOBECTOMY Right 11/05/2017   Procedure: THYROID LOBECTOMY;  Surgeon: Christene Lye, MD;  Location: [facility] ORS;  Service: General;  Laterality: Right;    FAMILY HISTORY :   Family History  Problem Relation Age of Onset  . Breast cancer Mother   . Hyperlipidemia Father   . Heart disease Paternal Grandmother   . Stroke Maternal Grandfather   . Prostate cancer Brother     SOCIAL HISTORY:    Social History   Tobacco Use  . Smoking status: Former Smoker    Packs/day: 0.25    Years: 35.00    Pack years: 8.75    Types: Cigarettes    Last attempt to quit: 08/11/2016    Years since quitting: 2.2  . Smokeless tobacco: Never Used  Substance Use Topics  . Alcohol use: No  . Drug use: No    ALLERGIES:  is allergic to prednisone.  MEDICATIONS:  Current Outpatient Medications  Medication Sig Dispense Refill  . acetaminophen (TYLENOL) 500 MG tablet Take 500 mg by mouth every 6 (six) hours as needed.    [name] aspirin EC 81 MG tablet Take 81 mg by mouth daily.    [name] docusate sodium (COLACE) 100 MG capsule Take 100 mg by mouth daily as needed.     [name] ibuprofen (ADVIL,MOTRIN) 200 MG tablet Take 200 mg by mouth every 6 (six) hours as needed.    . lidocaine-prilocaine (EMLA) cream Apply 1 application topically as needed (for port access). 30 g 3  . lisinopril (PRINIVIL,ZESTRIL) 10 MG tablet Take 10 mg by mouth daily.     . mirabegron ER (MYRBETRIQ) 25 MG TB24 tablet Take 1 tablet (25 mg total) by mouth daily. [place]  tablet 3  . traMADol (ULTRAM) 50 MG tablet Take 1 tablet (50 mg total) by mouth every 6 (six) hours as needed (for pain.). 30 tablet 0  . venlafaxine XR (EFFEXOR-XR) 75 MG 24 hr capsule Take 75 mg by mouth daily with breakfast.    . nitroGLYCERIN (NITROSTAT) 0.4 MG SL tablet Place 1 tablet (0.4 mg total) under the tongue every 5 (five) minutes as needed for chest pain. (Patient not taking: Reported on 11/13/2018) 25 tablet 6   No current facility-administered medications for this visit.     PHYSICAL EXAMINATION: ECOG PERFORMANCE STATUS: 0 - Asymptomatic  BP 105/69 (BP Location: Right Arm, Patient Position: Sitting)   Pulse 88   Temp 98.1 F (36.7 C) (Oral)   Resp 18   Ht '5\' 6"'$  (1.676 m)   Wt 177 lb 9.6 oz (80.6 kg)   BMI 28.67 kg/m   Filed Weights   11/13/18 1036  Weight: 177 lb 9.6 oz (80.6 kg)    Physical Exam  Constitutional: She is oriented to person, place, and  time and well-developed, well-nourished, and in no distress.  She is alone.  She is walking herself.  HENT:  Head: Normocephalic and atraumatic.  Mouth/Throat: Oropharynx is clear and moist. No oropharyngeal exudate.  Eyes: Pupils are equal, round, and reactive to light.  Neck: Normal range of motion. Neck supple.  Cardiovascular: Normal rate and regular rhythm.  Pulmonary/Chest: No respiratory distress. She has no wheezes.  Abdominal: Soft. Bowel sounds are normal. She exhibits no distension and no mass. There is no tenderness. There is no rebound and no guarding.  Colostomy in place.  Musculoskeletal: Normal range of motion. She exhibits no edema or tenderness.  Pain with extension of the hip-on the right side.  Neurological: She is alert and oriented to person, place, and time.  Skin: Skin is warm.  Psychiatric: Affect normal.      LABORATORY DATA:  I have reviewed the data as listed    Component Value Date/Time   NA 141 11/13/2018 1011   NA 140 03/08/2017 1223   NA 144 07/25/2014 0444   K 3.4 (L) 11/13/2018 1011   K 3.7 07/25/2014 0444   CL 98 11/13/2018 1011   CL 107 07/25/2014 0444   CO2 28 11/13/2018 1011   CO2 30 07/25/2014 0444   GLUCOSE 101 (H) 11/13/2018 1011   GLUCOSE 87 07/25/2014 0444   BUN 18 11/13/2018 1011   BUN 13 03/08/2017 1223   BUN 8 07/25/2014 0444   CREATININE 0.92 11/13/2018 1011   CREATININE 0.69 07/25/2014 0444   CALCIUM 9.4 11/13/2018 1011   CALCIUM 8.4 (L) 07/25/2014 0444   PROT 8.1 11/13/2018 1011   PROT 6.8 03/08/2017 1223   PROT 6.5 07/25/2014 0444   ALBUMIN 3.8 11/13/2018 1011   ALBUMIN 4.2 03/08/2017 1223   ALBUMIN 2.6 (L) 07/25/2014 0444   AST 18 11/13/2018 1011   AST 96 (H) 07/25/2014 0444   ALT 11 11/13/2018 1011   ALT 123 (H) 07/25/2014 0444   ALKPHOS 125 11/13/2018 1011   ALKPHOS 189 (H) 07/25/2014 0444   BILITOT 0.4 11/13/2018 1011   BILITOT 0.2 03/08/2017 1223   BILITOT 2.0 (H) 07/25/2014 0444   GFRNONAA >60 11/13/2018  1011   GFRNONAA >60 07/25/2014 0444   GFRAA >60 11/13/2018 1011   GFRAA >60 07/25/2014 0444    No results found for: SPEP, UPEP  Lab Results  Component Value Date   WBC 9.8 11/13/2018   NEUTROABS 8.1 (H)  11/13/2018   HGB 12.4 11/13/2018   HCT 38.2 11/13/2018   MCV 89.0 11/13/2018   PLT 261 11/13/2018      Chemistry      Component Value Date/Time   NA 141 11/13/2018 1011   NA 140 03/08/2017 1223   NA 144 07/25/2014 0444   K 3.4 (L) 11/13/2018 1011   K 3.7 07/25/2014 0444   CL 98 11/13/2018 1011   CL 107 07/25/2014 0444   CO2 28 11/13/2018 1011   CO2 30 07/25/2014 0444   BUN 18 11/13/2018 1011   BUN 13 03/08/2017 1223   BUN 8 07/25/2014 0444   CREATININE 0.92 11/13/2018 1011   CREATININE 0.69 07/25/2014 0444      Component Value Date/Time   CALCIUM 9.4 11/13/2018 1011   CALCIUM 8.4 (L) 07/25/2014 0444   ALKPHOS 125 11/13/2018 1011   ALKPHOS 189 (H) 07/25/2014 0444   AST 18 11/13/2018 1011   AST 96 (H) 07/25/2014 0444   ALT 11 11/13/2018 1011   ALT 123 (H) 07/25/2014 0444   BILITOT 0.4 11/13/2018 1011   BILITOT 0.2 03/08/2017 1223   BILITOT 2.0 (H) 07/25/2014 0444       RADIOGRAPHIC STUDIES: I have personally reviewed the radiological images as listed and agreed with the findings in the report. No results found.   ASSESSMENT & PLAN:  Rectal cancer metastasized to intrapelvic lymph node ([place]) # Rectal  Cancer- stage III; currently under surveillance; CEA slightly elevated between 45.  PET scan July 2019- for any recurrent disease.  Clinically stable.  # Colonoscopy thru stoma-suboptimal second to stool/Dr.Skulskie; awaiting repeat colonoscopy.  #Urinary incontinence-status post evaluation with urology.  Defer to urology.  # right hip arthritis- on NSAIDs; will need for Hip replacement [Dr.Hooten].  Okay to proceed with arthroplasty from oncology standpoint.  # Screening-patient is recommended mammogram screening.  I agree with PCPs recommendations.  #  DISPOSITION:  # port flush in 2 months # folllow up in 4 months/port flush/cbc/cmp/cea-Dr.B   Cc: PCP   Orders Placed This Encounter  Procedures  . CBC with Differential/Platelet    Standing Status:   Future    Standing Expiration Date:   11/14/2019  . Comprehensive metabolic panel    Standing Status:   Future    Standing Expiration Date:   11/14/2019  . CEA    Standing Status:   Future    Standing Expiration Date:   11/14/2019   All questions were answered. The patient knows to call the clinic with any problems, questions or concerns.      Cammie Sickle, MD 11/19/2018 9:52 AM

## 2018-11-14 ENCOUNTER — Telehealth: Payer: Self-pay | Admitting: Internal Medicine

## 2018-11-14 LAB — CEA: CEA1: 4.9 ng/mL — AB (ref 0.0–4.7)

## 2018-11-14 NOTE — Telephone Encounter (Signed)
I left patient a voicemail with these results. I advised patient to call back with any questions.

## 2018-11-14 NOTE — Telephone Encounter (Signed)
Please inform patient that tumor marker/CEA is improving at 4.9; continue follow-up as planned.  No new recommendations.

## 2018-11-22 ENCOUNTER — Ambulatory Visit: Payer: BLUE CROSS/BLUE SHIELD | Admitting: Urology

## 2018-11-22 NOTE — Progress Notes (Incomplete)
11/22/2018 7:07 AM   [name] October 21, 1956 [id number]  Referring provider: Lynnell Jude, MD [address] [place], [place] [address]  No chief complaint on file.   HPI: Rachel [name] is a 62 -year-old Caucasian female with stage III rectal cancer who returns today for the evaluation and management of UTI, microscopic hematuria, mixed incontinence and vaginal atrophy. Her last visit with [place] was on 08/21/2018 for a cystoscopy.   Background History:  She was referred to [place] by Dr. Rogue Bussing for urinary frequency with sister, Rachel [name].   Reviewing her records, she has had several UA with microscopic hematuria and equivocal urine culture results.  Former smoker quit only one year ago.   She gets frequency, burning and suprapubic pain with the UTI.  She was started on three days of Cipro and her symptoms abated, but they returned as soon as she stopped the medication.  She then started to have extreme bladder spasms.  She then was seen at an urgent and placed on a different antibiotic.  She noticed improvement in four days.  She was placed on the antibiotic for ten days.   She is having associated dysuria, nocturia and urge, stress and upon standing incontinence x 2-3.  Patient denies any gross hematuria, dysuria or suprapubic/flank pain.  Patient denies any fevers, chills, nausea or vomiting.  Her UA is positive for 3-10 RBC's.  Her PVR is 0 mL.   She does have a history of nephrolithiasis, GU surgery or GU trauma.  She is post menopausal. She has a colostomy at this time.   She is not drinking much water daily.   She is drinking three cups of coffee.  She is drinking two Pepsi's daily.  Occasional tea.  No juices.  No alcohol.      PMH: Past Medical History:  Diagnosis Date   Anginal pain (H[facility])    Arthritis    Body mass index (BMI) of 24.0-24.9 in adult    Cardiovascular disease    Colostomy present ([place])    Current every day smoker    a. ~35 years - > has cut down to < 3/4  ppd   Headache    History of rectal cancer    History of UTI    Hypertension    Midsternal chest pain    a. 05/2014 Stress Echo: Ex time: 8:09, ECG w/o acute changes, EF nl with possible mid and apical anterior HK-->cath recommended.   Port-A-Cath in place    RIGHT SIDE   Rectal cancer ([place]) 2017   Stroke ([place])    07/2016 tia no neurology followup now seen by pcp   Syncope and collapse    Thyroid nodule 2018   FINDINGS CONSISTENT WITH A HURTHLE CELL LESION AND/OR NEOPLASM (BETHESDA CATEGORY IV).    Surgical History: Past Surgical History:  Procedure Laterality Date   ABDOMINAL HYSTERECTOMY     ABDOMINAL PERINEAL BOWEL RESECTION N/A 03/16/2017   Procedure: ABDOMINAL PERINEAL RESECTION;  Surgeon: Christene Lye, MD;  Location: [facility] ORS;  Service: General;  Laterality: N/A;   APPENDECTOMY  03/16/2017   Procedure: APPENDECTOMY;  Surgeon: Christene Lye, MD;  Location: [facility] ORS;  Service: General;;   BACK SURGERY     l 3,4,5   BIOPSY THYROID Right 11/16/2016   FINDINGS CONSISTENT WITH A HURTHLE CELL LESION AND/OR NEOPLASM (BETHESDA CATEGORY IV).   CHOLECYSTECTOMY     COLONOSCOPY WITH PROPOFOL N/A 08/15/2016   Procedure: COLONOSCOPY WITH PROPOFOL;  Surgeon: Hassell Done  Kassie Mends, MD;  Location: [facility] ENDOSCOPY;  Service: Endoscopy;  Laterality: N/A;   COLONOSCOPY WITH PROPOFOL N/A 11/08/2016   Procedure: COLONOSCOPY WITH PROPOFOL;  Surgeon: Christene Lye, MD;  Location: [facility] ENDOSCOPY;  Service: Endoscopy;  Laterality: N/A;   COLOSTOMY N/A 11/23/2016   Procedure: SIGMOID COLOSTOMY;  Surgeon: Christene Lye, MD;  Location: [facility];  Service: General;  Laterality: N/A;   ERCP     INCISION AND DRAINAGE PERIRECTAL ABSCESS N/A 04/15/2018   Procedure: RECTAL ABSCESS;  Surgeon: Robert Bellow, MD;  Location: [facility];  Service: General;  Laterality: N/A;   LAPAROSCOPY N/A 11/23/2016   Procedure: LAPAROSCOPY DIAGNOSTIC;  Surgeon: Christene Lye, MD;  Location: [facility];  Service: General;  Laterality: N/A;   PARTIAL HYSTERECTOMY     PORTACATH PLACEMENT N/A 08/18/2016   Procedure: INSERTION PORT-A-CATH;  Surgeon: Christene Lye, MD;  Location: [facility];  Service: General;  Laterality: N/A;   THYROID LOBECTOMY Right 11/05/2017   Procedure: THYROID LOBECTOMY;  Surgeon: Christene Lye, MD;  Location: [facility];  Service: General;  Laterality: Right;    Home Medications:  Allergies as of 11/22/2018      Reactions   Prednisone Other (See Comments)   BLISTERS IN MOUTH AND ON TONGUE      Medication List       Accurate as of November 22, 2018  7:07 AM. Always use your most recent med list.        acetaminophen 500 MG tablet Commonly known as:  TYLENOL Take 500 mg by mouth every 6 (six) hours as needed.   aspirin EC 81 MG tablet Take 81 mg by mouth daily.   docusate sodium 100 MG capsule Commonly known as:  COLACE Take 100 mg by mouth daily as needed.   ibuprofen 200 MG tablet Commonly known as:  ADVIL,MOTRIN Take 200 mg by mouth every 6 (six) hours as needed.   lidocaine-prilocaine cream Commonly known as:  EMLA Apply 1 application topically as needed (for port access).   lisinopril 10 MG tablet Commonly known as:  PRINIVIL,ZESTRIL Take 10 mg by mouth daily.   mirabegron ER 25 MG Tb24 tablet Commonly known as:  MYRBETRIQ Take 1 tablet (25 mg total) by mouth daily.   nitroGLYCERIN 0.4 MG SL tablet Commonly known as:  NITROSTAT Place 1 tablet (0.4 mg total) under the tongue every 5 (five) minutes as needed for chest pain.   traMADol 50 MG tablet Commonly known as:  ULTRAM Take 1 tablet (50 mg total) by mouth every 6 (six) hours as needed (for pain.).   venlafaxine XR 75 MG 24 hr capsule Commonly known as:  EFFEXOR-XR Take 75 mg by mouth daily with breakfast.       Allergies:  Allergies  Allergen Reactions   Prednisone Other (See Comments)    BLISTERS IN MOUTH AND ON TONGUE     Family History: Family History  Problem Relation Age of Onset   Breast cancer Mother    Hyperlipidemia Father    Heart disease Paternal Grandmother    Stroke Maternal Grandfather    Prostate cancer Brother     Social History:  reports that she quit smoking about 2 years ago. Her smoking use included cigarettes. She has a 8.75 pack-year smoking history. She has never used smokeless tobacco. She reports that she does not drink alcohol or use drugs.  ROS:  Physical Exam: There were no vitals taken for this visit.  Constitutional:  Well nourished. Alert and oriented, No acute distress. HEENT: [place] AT, moist mucus membranes.  Trachea midline, no masses. Cardiovascular: No clubbing, cyanosis, or edema. Respiratory: Normal respiratory effort, no increased work of breathing. GI: Abdomen is soft, non tender, non distended, no abdominal masses. Liver and spleen not palpable.  No hernias appreciated.  Stool sample for occult testing is not indicated.   GU: No CVA tenderness.  No bladder fullness or masses.  *** external genitalia, *** pubic hair distribution, no lesions.  Normal urethral meatus, no lesions, no prolapse, no discharge.   No urethral masses, tenderness and/or tenderness. No bladder fullness, tenderness or masses. *** vagina mucosa, *** estrogen effect, no discharge, no lesions, *** pelvic support, *** cystocele and *** rectocele noted.  No cervical motion tenderness.  Uterus is freely mobile and non-fixed.  No adnexal/parametria masses or tenderness noted.  Anus and perineum are without rashes or lesions.   ***  Skin: No rashes, bruises or suspicious lesions. Lymph: No cervical or inguinal adenopathy. Neurologic: Grossly intact, no focal deficits, moving all 4 extremities. Psychiatric: Normal mood and affect.    Laboratory Data: Lab Results  Component Value Date   WBC 9.8 11/13/2018   HGB 12.4 11/13/2018   HCT  38.2 11/13/2018   MCV 89.0 11/13/2018   PLT 261 11/13/2018    Lab Results  Component Value Date   CREATININE 0.92 11/13/2018    Lab Results  Component Value Date   AST 18 11/13/2018   Lab Results  Component Value Date   ALT 11 11/13/2018   Urinalysis 3-10 RBC's.  See Epic.   I have reviewed the labs.  Pertinent Imaging:   Assessment & Plan:    1. UTI Patient will symptoms of an UTI with inconclusive cultures, but symptoms abated with ten days of antibiotics Will contact [facility] care for records  2. Microscopic hematuria I explained to the patient that there are a number of causes that can be associated with blood in the urine, such as stones, UTI's, damage to the urinary tract and/or cancer. At this time, I felt that the patient warranted further urologic evaluation with 3 or greater RBC's/hpf on microscopic evaluation of the urine.  The AUA guidelines state that a CT urogram is the preferred imaging study to evaluate hematuria  I explained to the patient that a contrast material will be injected into a vein and that in rare instances, an allergic reaction can result and may even life threatening   The patient denies any allergies to contrast, iodine and/or seafoo and is not taking metformin Following the imaging study,  I've recommended a cystoscopy. I described how this is performed, typically in an office setting with a flexible cystoscope. We described the risks, benefits, and possible side effects, the most common of which is a minor amount of blood in the urine and/or burning which usually resolves in 24 to 48 hours.   The patient had the opportunity to ask questions which were answered. Based upon this discussion, the patient is willing to proceed. Therefore, I've ordered: a CT Urogram and cystoscopy. The patient will return following all of the above for discussion of the results.  UA Urine culture    3. Mixed incontinence Discussed behavioral therapies,  bladder training and bladder control strategies Pelvic floor muscle training - patient deferred  Patient is encouraged to increase her water intake Offered medical therapy with anticholinergic therapy  or beta-3 adrenergic receptor agonist and the potential side effects of each therapy - would like to try the beta-3 adrenergic receptor agonist (Myrbetriq).  Given Myrbetriq 25 mg samples, #28.  I have reviewed with the patient of the side effects of Myrbetriq, such as: elevation in BP, urinary retention and/or HA.    4. Vaginal atrophy Patient was given a sample of vaginal estrogen cream (Premarin vaginal cream) and instructed to apply 0.5mg  (pea-sized amount)  just inside the vaginal introitus with a finger-tip on Monday, Wednesday and Friday nights.  I explained to the patient that vaginally administered estrogen, which causes only a slight increase in the blood estrogen levels, have fewer contraindications and adverse systemic effects that oral HT. I have also given prescriptions for the Estrace cream and Premarin cream, so that the patient may carry them to the pharmacy to see which one of the branded creams would be most economical for her.  If she finds both medications cost prohibitive, she is instructed to call the office.  We can then call in a compounded vaginal estrogen cream for the patient that may be more affordable.   She will follow up in three months for an exam.     No follow-ups on file.  These notes generated with voice recognition software. I apologize for typographical errors.  [facility] [address] [place], [place] [address] [phone]  I, Lucas Mallow, am acting as a Education administrator for [employer],  {Add [employer] Statement}

## 2018-12-18 ENCOUNTER — Other Ambulatory Visit: Payer: Self-pay | Admitting: Urology

## 2019-01-06 ENCOUNTER — Other Ambulatory Visit: Payer: Self-pay | Admitting: Nurse Practitioner

## 2019-01-15 ENCOUNTER — Inpatient Hospital Stay: Payer: BLUE CROSS/BLUE SHIELD | Attending: Internal Medicine

## 2019-02-20 ENCOUNTER — Other Ambulatory Visit: Payer: Self-pay

## 2019-02-20 ENCOUNTER — Encounter: Payer: Self-pay | Admitting: General Surgery

## 2019-02-20 ENCOUNTER — Ambulatory Visit (INDEPENDENT_AMBULATORY_CARE_PROVIDER_SITE_OTHER): Payer: BLUE CROSS/BLUE SHIELD | Admitting: General Surgery

## 2019-02-20 VITALS — BP 144/92 | HR 92 | Temp 98.1°F | Resp 16 | Ht 65.0 in | Wt 178.0 lb

## 2019-02-20 DIAGNOSIS — C20 Malignant neoplasm of rectum: Secondary | ICD-10-CM | POA: Diagnosis not present

## 2019-02-20 NOTE — Patient Instructions (Signed)
The patient is aware to call back for any questions or new concerns.  

## 2019-02-20 NOTE — Progress Notes (Signed)
Patient ID: Rachel [name], female   DOB: December 31, 1955, 63 y.o.   MRN: [id number]  Chief Complaint  Patient presents with  . Follow-up    HPI Rachel [name] is a 63 y.o. female.  Here for follow up rectal cancer. She states she noticed some drainage last month but it has since stopped. She is having regular BM via ostomy. She has increased stress at home caring for her father and ex husband who both had strokes. She states they attempted a colonoscopy in January but she was unable to tolerate the prep secondary to uncontrolled vomiting.  HPI  Past Medical History:  Diagnosis Date  . Anginal pain ([place])   . Arthritis   . Body mass index (BMI) of 24.0-24.9 in adult   . Cardiovascular disease   . Colostomy present ([place])   . Current every day smoker    a. ~35 years - > has cut down to < 3/4 ppd  . Headache   . History of rectal cancer   . History of UTI   . Hypertension   . Midsternal chest pain    a. 05/2014 Stress Echo: Ex time: 8:09, ECG w/o acute changes, EF nl with possible mid and apical anterior HK-->cath recommended.  . Port-A-Cath in place    RIGHT SIDE  . Rectal cancer ([place]) 2017  . Stroke ([place])    07/2016 tia no neurology followup now seen by pcp  . Syncope and collapse   . Thyroid nodule 2018   FINDINGS CONSISTENT WITH A HURTHLE CELL LESION AND/OR NEOPLASM (BETHESDA CATEGORY IV).    Past Surgical History:  Procedure Laterality Date  . ABDOMINAL HYSTERECTOMY    . ABDOMINAL PERINEAL BOWEL RESECTION N/A 03/16/2017   Procedure: ABDOMINAL PERINEAL RESECTION;  Surgeon: Christene Lye, MD;  Location: [facility] ORS;  Service: General;  Laterality: N/A;  . APPENDECTOMY  03/16/2017   Procedure: APPENDECTOMY;  Surgeon: Christene Lye, MD;  Location: [facility] ORS;  Service: General;;  . BACK SURGERY     l 3,4,5  . BIOPSY THYROID Right 11/16/2016   FINDINGS CONSISTENT WITH A HURTHLE CELL LESION AND/OR NEOPLASM (BETHESDA CATEGORY IV).  [name] CHOLECYSTECTOMY    . COLONOSCOPY WITH  PROPOFOL N/A 08/15/2016   Procedure: COLONOSCOPY WITH PROPOFOL;  Surgeon: Lollie Sails, MD;  Location: [facility] ENDOSCOPY;  Service: Endoscopy;  Laterality: N/A;  . COLONOSCOPY WITH PROPOFOL N/A 11/08/2016   Procedure: COLONOSCOPY WITH PROPOFOL;  Surgeon: Christene Lye, MD;  Location: [facility] ENDOSCOPY;  Service: Endoscopy;  Laterality: N/A;  . COLOSTOMY N/A 11/23/2016   Procedure: SIGMOID COLOSTOMY;  Surgeon: Christene Lye, MD;  Location: [facility] ORS;  Service: General;  Laterality: N/A;  . ERCP    . INCISION AND DRAINAGE PERIRECTAL ABSCESS N/A 04/15/2018   Procedure: RECTAL ABSCESS;  Surgeon: Robert Bellow, MD;  Location: [facility] ORS;  Service: General;  Laterality: N/A;  . LAPAROSCOPY N/A 11/23/2016   Procedure: LAPAROSCOPY DIAGNOSTIC;  Surgeon: Christene Lye, MD;  Location: [facility] ORS;  Service: General;  Laterality: N/A;  . PARTIAL HYSTERECTOMY    . PORTACATH PLACEMENT N/A 08/18/2016   Procedure: INSERTION PORT-A-CATH;  Surgeon: Christene Lye, MD;  Location: [facility] ORS;  Service: General;  Laterality: N/A;  . THYROID LOBECTOMY Right 11/05/2017   Procedure: THYROID LOBECTOMY;  Surgeon: Christene Lye, MD;  Location: [facility] ORS;  Service: General;  Laterality: Right;    Family History  Problem Relation Age of Onset  . Breast cancer Mother   . Hyperlipidemia  Father   . Heart disease Paternal Grandmother   . Stroke Maternal Grandfather   . Prostate cancer Brother     Social History Social History   Tobacco Use  . Smoking status: Former Smoker    Packs/day: 0.25    Years: 35.00    Pack years: 8.75    Types: Cigarettes    Last attempt to quit: 08/11/2016    Years since quitting: 2.5  . Smokeless tobacco: Never Used  Substance Use Topics  . Alcohol use: No  . Drug use: No    Allergies  Allergen Reactions  . Prednisone Other (See Comments)    BLISTERS IN MOUTH AND ON TONGUE    Current Outpatient Medications  Medication Sig Dispense Refill  .  acetaminophen (TYLENOL) 500 MG tablet Take 500 mg by mouth every 6 (six) hours as needed.    [name] aspirin EC 81 MG tablet Take 81 mg by mouth daily.    [name] docusate sodium (COLACE) 100 MG capsule Take 100 mg by mouth daily as needed.     [name] ibuprofen (ADVIL,MOTRIN) 200 MG tablet Take 200 mg by mouth every 6 (six) hours as needed.    . lidocaine-prilocaine (EMLA) cream Apply 1 application topically as needed (for port access). 30 g 3  . lisinopril (PRINIVIL,ZESTRIL) 10 MG tablet Take 10 mg by mouth daily.     [name] MYRBETRIQ 25 MG TB24 tablet TAKE 1 TABLET BY MOUTH EVERY DAY 30 tablet 3  . nitroGLYCERIN (NITROSTAT) 0.4 MG SL tablet Place 1 tablet (0.4 mg total) under the tongue every 5 (five) minutes as needed for chest pain. 25 tablet 6  . traMADol (ULTRAM) 50 MG tablet Take 1 tablet (50 mg total) by mouth every 6 (six) hours as needed (for pain.). 30 tablet 0  . venlafaxine XR (EFFEXOR-XR) 150 MG 24 hr capsule Take 150 mg by mouth daily with breakfast.     No current facility-administered medications for this visit.     Review of Systems Review of Systems  Constitutional: Negative.   Respiratory: Negative.   Cardiovascular: Negative.     Blood pressure (!) 144/92, pulse 92, temperature 98.1 F (36.7 C), temperature source Temporal, resp. rate 16, height 5\' 5"  (1.651 m), weight 178 lb (80.7 kg).  Physical Exam Physical Exam Constitutional:      Appearance: Normal appearance.  HENT:     Mouth/Throat:     Pharynx: No oropharyngeal exudate.  Eyes:     General: No scleral icterus. Neck:     Musculoskeletal: Neck supple.  Cardiovascular:     Rate and Rhythm: Normal rate and regular rhythm.     Heart sounds: Normal heart sounds.  Pulmonary:     Effort: Pulmonary effort is normal.     Breath sounds: Normal breath sounds.  Abdominal:     General: Bowel sounds are normal.     Palpations: Abdomen is soft.     Hernia: No hernia is present.       Comments: Ostomy intact   Genitourinary:   Lymphadenopathy:     Upper Body:     Left upper body: No supraclavicular adenopathy.  Neurological:     Mental Status: She is alert and oriented to person, place, and time.  Psychiatric:        Mood and Affect: Mood normal.     Data Reviewed June 28, 2018 PET scan showed intense activity in the area of the anal canal in the soft tissues in the presacral space.  No  evidence of metastatic disease. The patient underwent operative debridement of the perineal area on Apr 15, 2018 with pathology showing only chronic inflammatory tissue. November 13, 2018 CEA: 4.9.,  Steadily decreasing over the last 6 months.  Assessment No evidence of recurrent cancer perineal exam.  Plan  The patient has not had a colonoscopy now 22 months post abdominal perineal resection.  She has been encouraged to touch base with Dr. Marton Redwood office about alternative prep regimens that might not produce such pronounced vomiting, even if it meant a 3-day clear liquid prep and use of something as simple of citrate of magnesia and stoma irrigations.  The patient reported that she had been making use of a fiber bar with blueberries and would have daily spontaneous and near complete stools the following morning, and when she stopped this the colostomy output is fairly irregular and she would like to avoid daytime stooling.  She is encouraged to find a fiber supplement that provides her good relief and if not we can make referral to the ostomy nurse for discussion of irrigation and stoma "training".   Follow up in one year. Follow up with Dr Donnella Sham.   HPI, assessment, plan and physical exam has been scribed under the direction and in the presence of Robert Bellow, MD. Karie Fetch, RN  I have completed the exam and reviewed the above documentation for accuracy and completeness.  I agree with the above.  Haematologist has been used and any errors in dictation or transcription are  unintentional.  Hervey Ard, M.D., F.A.C.S.  Forest Gleason Xavier Munger 02/20/2019, 2:17 PM

## 2019-03-11 ENCOUNTER — Other Ambulatory Visit: Payer: Self-pay

## 2019-03-12 ENCOUNTER — Inpatient Hospital Stay: Payer: BLUE CROSS/BLUE SHIELD | Attending: Internal Medicine

## 2019-03-12 ENCOUNTER — Other Ambulatory Visit: Payer: Self-pay | Admitting: *Deleted

## 2019-03-12 ENCOUNTER — Other Ambulatory Visit: Payer: Self-pay

## 2019-03-12 ENCOUNTER — Inpatient Hospital Stay (HOSPITAL_BASED_OUTPATIENT_CLINIC_OR_DEPARTMENT_OTHER): Payer: BLUE CROSS/BLUE SHIELD | Admitting: Internal Medicine

## 2019-03-12 DIAGNOSIS — C775 Secondary and unspecified malignant neoplasm of intrapelvic lymph nodes: Secondary | ICD-10-CM

## 2019-03-12 DIAGNOSIS — C20 Malignant neoplasm of rectum: Secondary | ICD-10-CM | POA: Diagnosis present

## 2019-03-12 DIAGNOSIS — Z7982 Long term (current) use of aspirin: Secondary | ICD-10-CM | POA: Insufficient documentation

## 2019-03-12 DIAGNOSIS — F17200 Nicotine dependence, unspecified, uncomplicated: Secondary | ICD-10-CM | POA: Diagnosis not present

## 2019-03-12 DIAGNOSIS — Z79899 Other long term (current) drug therapy: Secondary | ICD-10-CM | POA: Diagnosis not present

## 2019-03-12 DIAGNOSIS — Z8673 Personal history of transient ischemic attack (TIA), and cerebral infarction without residual deficits: Secondary | ICD-10-CM | POA: Diagnosis not present

## 2019-03-12 DIAGNOSIS — Z95828 Presence of other vascular implants and grafts: Secondary | ICD-10-CM

## 2019-03-12 DIAGNOSIS — Z7902 Long term (current) use of antithrombotics/antiplatelets: Secondary | ICD-10-CM | POA: Diagnosis not present

## 2019-03-12 DIAGNOSIS — I10 Essential (primary) hypertension: Secondary | ICD-10-CM | POA: Diagnosis not present

## 2019-03-12 LAB — COMPREHENSIVE METABOLIC PANEL
ALT: 16 U/L (ref 0–44)
AST: 17 U/L (ref 15–41)
Albumin: 3.9 g/dL (ref 3.5–5.0)
Alkaline Phosphatase: 123 U/L (ref 38–126)
Anion gap: 9 (ref 5–15)
BUN: 14 mg/dL (ref 8–23)
CO2: 28 mmol/L (ref 22–32)
Calcium: 8.9 mg/dL (ref 8.9–10.3)
Chloride: 101 mmol/L (ref 98–111)
Creatinine, Ser: 0.61 mg/dL (ref 0.44–1.00)
GFR calc Af Amer: >60 mL/min (ref >60)
GFR calc non Af Amer: >60 mL/min (ref >60)
Glucose, Bld: 89 mg/dL (ref 70–99)
Potassium: 3.4 mmol/L — ABNORMAL LOW (ref 3.5–5.1)
Sodium: 138 mmol/L (ref 135–145)
Total Bilirubin: 0.5 mg/dL (ref 0.3–1.2)
Total Protein: 7.8 g/dL (ref 6.5–8.1)

## 2019-03-12 LAB — CBC WITH DIFFERENTIAL/PLATELET
Abs Immature Granulocytes: 0.03 10*3/uL (ref 0.00–0.07)
Basophils Absolute: 0 10*3/uL (ref 0.0–0.1)
Basophils Relative: 0 %
Eosinophils Absolute: 0.1 10*3/uL (ref 0.0–0.5)
Eosinophils Relative: 1 %
HCT: 38.4 % (ref 36.0–46.0)
Hemoglobin: 12.7 g/dL (ref 12.0–15.0)
Immature Granulocytes: 0 %
Lymphocytes Relative: 16 %
Lymphs Abs: 1.3 10*3/uL (ref 0.7–4.0)
MCH: 29.4 pg (ref 26.0–34.0)
MCHC: 33.1 g/dL (ref 30.0–36.0)
MCV: 88.9 fL (ref 80.0–100.0)
Monocytes Absolute: 0.6 10*3/uL (ref 0.1–1.0)
Monocytes Relative: 8 %
Neutro Abs: 5.8 10*3/uL (ref 1.7–7.7)
Neutrophils Relative %: 75 %
Platelets: 227 10*3/uL (ref 150–400)
RBC: 4.32 MIL/uL (ref 3.87–5.11)
RDW: 14.6 % (ref 11.5–15.5)
WBC: 7.8 10*3/uL (ref 4.0–10.5)
nRBC: 0 % (ref 0.0–0.2)

## 2019-03-12 MED ORDER — HEPARIN SOD (PORK) LOCK FLUSH 100 UNIT/ML IV SOLN
500.0000 [IU] | Freq: Once | INTRAVENOUS | Status: AC
  Administered 2019-03-12: 500 [IU] via INTRAVENOUS

## 2019-03-12 MED ORDER — SODIUM CHLORIDE 0.9% FLUSH
10.0000 mL | Freq: Once | INTRAVENOUS | Status: AC
  Administered 2019-03-12: 10 mL via INTRAVENOUS
  Filled 2019-03-12: qty 10

## 2019-03-12 NOTE — Assessment & Plan Note (Addendum)
#   Rectal  Cancer- stage III; currently under surveillance; CEA slightly elevated between 4-5.  PET scan July 2019- for any recurrent disease.  STABLE.  CEA pending today.  If she is significantly elevated-then recommend CT scan sooner.  Otherwise we will get scan at next visit/will order at next visit.  # Colonoscopy thru stoma-suboptimal second to stool/Dr.Skulskie; awaiting repeat scope.   #Urinary incontinence-status post evaluation with urology. Improved.   # right hip arthritis- on NSAIDs; will need for Hip replacement [Dr.Hooten]. STABLE.   # DISPOSITION:  # folllow up in 3 months/port flush/cbc/cmp/cea; CT scan prior-Dr.B

## 2019-03-12 NOTE — Progress Notes (Signed)
Telephone call for patient's televisits. Patient has no medical concerns. Medications reconciled per policy.

## 2019-03-12 NOTE — Progress Notes (Signed)
I connected with @NAME  @ on 03/12/19 at 11:00 AM EDTby telephone and verified that I am speaking with the patient using 2 identifiers.  # LOCATION:  Patient: Home  Provider: Office  I discussed the limitations, risks, security and privacy concerns of performing an evaluation and management service by telephone and the availability of in person appointments.  I also discussed with the patient that there may be a patient responsible charge related to the service.  The patient expressed understanding and agrees to proceed.  History of present illness:Rachel [name] 63 y.o.  female with history of stage III rectal cancer.  Denies abdominal pain nausea vomiting but appetite is good.  No blood in stools or black or stools.  Chronic hip pain.  Observation/objective: CBC CMP normal limits.  CEA pending.  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  PET scan July 2019- for any recurrent disease.  STABLE.  CEA pending today.  If she is significantly elevated-then recommend CT scan sooner.  Otherwise we will get scan at next visit/will order at next visit.  # Colonoscopy thru stoma-suboptimal second to stool/Dr.Skulskie; awaiting repeat scope.   #Urinary incontinence-status post evaluation with urology. Improved.   # right hip arthritis- on NSAIDs; will need for Hip replacement [Dr.Hooten]. STABLE.   # DISPOSITION:  # folllow up in 3 months/port flush/cbc/cmp/cea; CT scan prior-Dr.B      Follow-up instructions:  I discussed the assessment and treatment plan with the patient.  The patient was provided an opportunity to ask questions and all were answered.  The patient agreed with the plan and demonstrated understanding of instructions.  The patient was advised to call back or seek an in person evaluation if the symptoms worsen or if the condition fails to improve as  anticipated.  I provided 6 minutes of non-face-to-face time during this encounter   Dr. Charlaine Dalton [facility] 03/12/2019 11:21 AM

## 2019-03-13 LAB — CEA: CEA: 5.6 ng/mL — ABNORMAL HIGH (ref 0.0–4.7)

## 2019-05-08 ENCOUNTER — Other Ambulatory Visit: Payer: Self-pay | Admitting: Family Medicine

## 2019-05-08 DIAGNOSIS — Z1231 Encounter for screening mammogram for malignant neoplasm of breast: Secondary | ICD-10-CM

## 2019-06-11 ENCOUNTER — Inpatient Hospital Stay: Payer: BLUE CROSS/BLUE SHIELD | Attending: Internal Medicine

## 2019-06-11 ENCOUNTER — Other Ambulatory Visit: Payer: Self-pay

## 2019-06-11 ENCOUNTER — Inpatient Hospital Stay (HOSPITAL_BASED_OUTPATIENT_CLINIC_OR_DEPARTMENT_OTHER): Payer: BLUE CROSS/BLUE SHIELD | Admitting: Internal Medicine

## 2019-06-11 VITALS — BP 142/84 | HR 67 | Temp 98.1°F | Resp 18 | Wt 164.6 lb

## 2019-06-11 DIAGNOSIS — R32 Unspecified urinary incontinence: Secondary | ICD-10-CM | POA: Diagnosis not present

## 2019-06-11 DIAGNOSIS — R634 Abnormal weight loss: Secondary | ICD-10-CM | POA: Insufficient documentation

## 2019-06-11 DIAGNOSIS — C775 Secondary and unspecified malignant neoplasm of intrapelvic lymph nodes: Secondary | ICD-10-CM

## 2019-06-11 DIAGNOSIS — R110 Nausea: Secondary | ICD-10-CM

## 2019-06-11 DIAGNOSIS — C20 Malignant neoplasm of rectum: Secondary | ICD-10-CM

## 2019-06-11 DIAGNOSIS — E876 Hypokalemia: Secondary | ICD-10-CM | POA: Diagnosis not present

## 2019-06-11 DIAGNOSIS — Z79899 Other long term (current) drug therapy: Secondary | ICD-10-CM | POA: Insufficient documentation

## 2019-06-11 DIAGNOSIS — Z95828 Presence of other vascular implants and grafts: Secondary | ICD-10-CM

## 2019-06-11 LAB — COMPREHENSIVE METABOLIC PANEL
ALT: 12 U/L (ref 0–44)
AST: 17 U/L (ref 15–41)
Albumin: 3.6 g/dL (ref 3.5–5.0)
Alkaline Phosphatase: 113 U/L (ref 38–126)
Anion gap: 13 (ref 5–15)
BUN: 9 mg/dL (ref 8–23)
CO2: 31 mmol/L (ref 22–32)
Calcium: 8.7 mg/dL — ABNORMAL LOW (ref 8.9–10.3)
Chloride: 94 mmol/L — ABNORMAL LOW (ref 98–111)
Creatinine, Ser: 0.63 mg/dL (ref 0.44–1.00)
GFR calc Af Amer: >60 mL/min (ref >60)
GFR calc non Af Amer: >60 mL/min (ref >60)
Glucose, Bld: 135 mg/dL — ABNORMAL HIGH (ref 70–99)
Potassium: 2.2 mmol/L — CL (ref 3.5–5.1)
Sodium: 138 mmol/L (ref 135–145)
Total Bilirubin: 0.8 mg/dL (ref 0.3–1.2)
Total Protein: 7.7 g/dL (ref 6.5–8.1)

## 2019-06-11 LAB — CBC WITH DIFFERENTIAL/PLATELET
Abs Immature Granulocytes: 0.05 10*3/uL (ref 0.00–0.07)
Basophils Absolute: 0 10*3/uL (ref 0.0–0.1)
Basophils Relative: 0 %
Eosinophils Absolute: 0 10*3/uL (ref 0.0–0.5)
Eosinophils Relative: 0 %
HCT: 36.5 % (ref 36.0–46.0)
Hemoglobin: 12.3 g/dL (ref 12.0–15.0)
Immature Granulocytes: 1 %
Lymphocytes Relative: 10 %
Lymphs Abs: 1 10*3/uL (ref 0.7–4.0)
MCH: 29.7 pg (ref 26.0–34.0)
MCHC: 33.7 g/dL (ref 30.0–36.0)
MCV: 88.2 fL (ref 80.0–100.0)
Monocytes Absolute: 0.7 10*3/uL (ref 0.1–1.0)
Monocytes Relative: 7 %
Neutro Abs: 8.9 10*3/uL — ABNORMAL HIGH (ref 1.7–7.7)
Neutrophils Relative %: 82 %
Platelets: 210 10*3/uL (ref 150–400)
RBC: 4.14 MIL/uL (ref 3.87–5.11)
RDW: 14 % (ref 11.5–15.5)
WBC: 10.7 10*3/uL — ABNORMAL HIGH (ref 4.0–10.5)
nRBC: 0 % (ref 0.0–0.2)

## 2019-06-11 LAB — MAGNESIUM: Magnesium: 2.1 mg/dL (ref 1.7–2.4)

## 2019-06-11 MED ORDER — POTASSIUM CHLORIDE CRYS ER 20 MEQ PO TBCR: EXTENDED_RELEASE_TABLET | ORAL | 3 refills | Status: DC

## 2019-06-11 MED ORDER — PROCHLORPERAZINE MALEATE 10 MG PO TABS: 10.0000 mg | ORAL_TABLET | Freq: Four times a day (QID) | ORAL | 1 refills | Status: DC | PRN

## 2019-06-11 MED ORDER — HEPARIN SOD (PORK) LOCK FLUSH 100 UNIT/ML IV SOLN
500.0000 [IU] | Freq: Once | INTRAVENOUS | Status: AC
  Administered 2019-06-11: 500 [IU] via INTRAVENOUS

## 2019-06-11 MED ORDER — SODIUM CHLORIDE 0.9% FLUSH
10.0000 mL | Freq: Once | INTRAVENOUS | Status: AC
  Administered 2019-06-11: 10:00:00 10 mL via INTRAVENOUS
  Filled 2019-06-11: qty 10

## 2019-06-11 NOTE — Assessment & Plan Note (Addendum)
#   Rectal  Cancer- stage III; currently under surveillance; CEA slightly elevated between 4-5.  PET scan July 2019- for any recurrent disease.  Clinically stable.  Repeat CEA pending.  If significantly elevated would recommend CT scan.  #Severe hypokalemia-2.2 potassium.  Unclear etiology.  Magnesium was normal.  If not improving recommend nephrology evaluation.  #Extreme nausea/weight loss-again unclear etiology; await CEA.  Recommend Compazine.  Low threshold for imaging.  #Urinary incontinence-status post evaluation with urology.  Improved.  # DISPOSITION:  # add mag today # folllow up in 2 weeks- MD ; bmp; -Dr.B

## 2019-06-11 NOTE — Progress Notes (Signed)
For the past 3 weeks patient is having new nausea and decrease in appetite.  Has lost 14 pounds in the last 4 months.  States she hasn't felt like herself lately and being more fatigued with wanting to sleep all the time.

## 2019-06-11 NOTE — Progress Notes (Signed)
[place] OFFICE PROGRESS NOTE  Patient Care Team: Lynnell Jude, MD as PCP - General (Family Medicine) Cammie Sickle, MD as Consulting Physician (Internal Medicine) Noreene Filbert, MD as Referring Physician (Radiation Oncology) Clent Jacks, RN as Oncology Nurse Navigator Christene Lye, MD (General Surgery)  Cancer Staging No matching staging information was found for the patient.   Oncology History Overview Note  # SEP 2017- RECTAL CA mod diff adeno; STAGE III [no EUS; positive ~62m LN; Dr.sankar] CEA-7; Sep 21st START 5FU-RT [finished NOV 2017]; April 7th APR- 2018- [id number] [5LN]; positive for LVI/PNI [poor response to chemo]; Margins-Negative.  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  female pleasant patient above history of stage III rectal cancer status post APR is here for follow-up; patient is currently on surveillance.  Patient feels poorly.  She complains of nausea.  Positive for weight loss.  Poor appetite.   Complains of muscle pain joint pains.  Feels extremely fatigued.  Denies any significant diarrhea/blood in stools or black or stools.   Review of Systems  Constitutional: Positive for malaise/fatigue and weight loss. Negative for chills, diaphoresis  and fever.  HENT: Negative for nosebleeds and sore throat.   Eyes: Negative for double vision.  Respiratory: Negative for cough, hemoptysis, sputum production, shortness of breath and wheezing.   Cardiovascular: Negative for chest pain, palpitations and orthopnea.  Gastrointestinal: Positive for nausea. Negative for abdominal pain, blood in stool, constipation, diarrhea, heartburn, melena and vomiting.  Genitourinary: Negative for dysuria, frequency and urgency.  Musculoskeletal: Positive for joint pain (hip pain right/back). Negative for back pain.  Skin: Negative.  Negative for itching and rash.  Neurological: Positive for tingling. Negative for dizziness, focal weakness, weakness and headaches.  Endo/Heme/Allergies: Does not bruise/bleed easily.  Psychiatric/Behavioral: Negative for depression. The patient is not nervous/anxious and does not have insomnia.       PAST MEDICAL HISTORY :  Past Medical History:  Diagnosis Date  . Anginal pain ([place]   . Arthritis   . Body mass index (BMI) of 24.0-24.9 in adult   . Cardiovascular disease   . Colostomy present ([place]   . Current every day smoker    a. ~35 years - > has cut down to < 3/4 ppd  . Headache   . History of rectal cancer   . History of UTI   . Hypertension   . Midsternal chest pain    a. 05/2014 Stress Echo: Ex time: 8:09, ECG w/o acute changes, EF nl with possible mid and apical anterior HK-->cath recommended.  . Port-A-Cath in place    RIGHT SIDE  . [place] cancer ([place]  . Stroke ([place]    07/2016 tia no neurology followup now seen by pcp  . Syncope and collapse   . Thyroid nodule 2018   FINDINGS CONSISTENT WITH A HURTHLE CELL  LESION AND/OR NEOPLASM (BETHESDA CATEGORY IV).    PAST SURGICAL HISTORY :   Past Surgical History:  Procedure Laterality Date  . ABDOMINAL HYSTERECTOMY    . ABDOMINAL PERINEAL BOWEL RESECTION N/A 03/16/2017   Procedure: ABDOMINAL PERINEAL RESECTION;  Surgeon: Christene Lye, MD;   Location: [facility] ORS;  Service: General;  Laterality: N/A;  . APPENDECTOMY  03/16/2017   Procedure: APPENDECTOMY;  Surgeon: Christene Lye, MD;  Location: [facility] ORS;  Service: General;;  . BACK SURGERY     l 3,4,5  . BIOPSY THYROID Right 11/16/2016   FINDINGS CONSISTENT WITH A HURTHLE CELL LESION AND/OR NEOPLASM (BETHESDA CATEGORY IV).  [name] CHOLECYSTECTOMY    . COLONOSCOPY WITH PROPOFOL N/A 08/15/2016   Procedure: COLONOSCOPY WITH PROPOFOL;  Surgeon: Lollie Sails, MD;  Location[facility] ENDOSCOPY;  Service: Endoscopy;  Laterality: N/A;  . COLONOSCOPY WITH PROPOFOL N/A 11/08/2016   Procedure: COLONOSCOPY WITH PROPOFOL;  Surgeon: Christene Lye, MD;  Location: [facility] ENDOSCOPY;  Service: Endoscopy;  Laterality: N/A;  . COLOSTOMY N/A 11/23/2016   Procedure: SIGMOID COLOSTOMY;  Surgeon: Christene Lye, MD;  Location: [facility] ORS;  Service: General;  Laterality: N/A;  . ERCP    . INCISION AND DRAINAGE PERIRECTAL ABSCESS N/A 04/15/2018   Procedure: RECTAL ABSCESS;  Surgeon: Robert Bellow, MD;  Location: [facility] ORS;  Service: General;  Laterality: N/A;  . LAPAROSCOPY N/A 11/23/2016   Procedure: LAPAROSCOPY DIAGNOSTIC;  Surgeon: Christene Lye, MD;  Location: [facility] ORS;  Service: General;  Laterality: N/A;  . PARTIAL HYSTERECTOMY    . PORTACATH PLACEMENT N/A 08/18/2016   Procedure: INSERTION PORT-A-CATH;  Surgeon: Christene Lye, MD;  Location: [facility] ORS;  Service: General;  Laterality: N/A;  . THYROID LOBECTOMY Right 11/05/2017   Procedure: THYROID LOBECTOMY;  Surgeon: Christene Lye, MD;  Location: [facility] ORS;  Service: General;  Laterality: Right;    FAMILY HISTORY :   Family History  Problem Relation Age of Onset  . Breast cancer Mother   . Hyperlipidemia Father   . Heart disease Paternal Grandmother   . Stroke Maternal Grandfather   . Prostate cancer Brother     SOCIAL HISTORY:   Social History   Tobacco Use  . Smoking status: Former Smoker     Packs/day: 0.25    Years: 35.00    Pack years: 8.75    Types: Cigarettes    Quit date: 08/11/2016    Years since quitting: 2.8  . Smokeless tobacco: Never Used  Substance Use Topics  . Alcohol use: No  . Drug use: No    ALLERGIES:  is allergic to prednisone.  MEDICATIONS:  Current Outpatient Medications  Medication Sig Dispense Refill  . acetaminophen (TYLENOL) 500 MG tablet Take 500 mg by mouth every 6 (six) hours as needed.    [name] aspirin EC 81 MG tablet Take 81 mg by mouth daily.    [name] docusate sodium (COLACE) 100 MG capsule Take 100 mg by mouth daily as needed.     [name] ibuprofen (ADVIL,MOTRIN) 200 MG tablet Take 200 mg by mouth every 6 (six) hours as needed.    . lidocaine-prilocaine (EMLA) cream Apply 1 application topically as needed (for port access). 30 g 3  . lisinopril (PRINIVIL,ZESTRIL) 10 MG tablet Take 10 mg by mouth daily.     . nitroGLYCERIN (NITROSTAT) 0.4 MG SL tablet Place 1 tablet (0.4 mg total) under the tongue every 5 (five) minutes as needed for chest pain. 25 tablet 6  . traMADol (  ULTRAM) 50 MG tablet Take 1 tablet (50 mg total) by mouth every 6 (six) hours as needed (for pain.). 30 tablet 0  . venlafaxine XR (EFFEXOR-XR) 150 MG 24 hr capsule Take 150 mg by mouth daily with breakfast.    . MYRBETRIQ 25 MG TB24 tablet TAKE 1 TABLET BY MOUTH EVERY DAY (Patient not taking: Reported on 06/11/2019) 30 tablet 3  . potassium chloride SA (K-DUR) 20 MEQ tablet 1 pill twice a day 60 tablet 3  . prochlorperazine (COMPAZINE) 10 MG tablet Take 1 tablet (10 mg total) by mouth every 6 (six) hours as needed for nausea or vomiting. 60 tablet 1   No current facility-administered medications for this visit.     PHYSICAL EXAMINATION: ECOG PERFORMANCE STATUS: 0 - Asymptomatic  BP (!) 142/84   Pulse 67   Temp 98.1 F (36.7 C)   Resp 18   Wt 164 lb 9.6 oz (74.7 kg)   BMI 27.39 kg/m   Filed Weights   06/11/19 1044  Weight: 164 lb 9.6 oz (74.7 kg)    Physical Exam   Constitutional: She is oriented to person, place, and time and well-developed, well-nourished, and in no distress.  She is alone.  She is walking herself.  HENT:  Head: Normocephalic and atraumatic.  Mouth/Throat: Oropharynx is clear and moist. No oropharyngeal exudate.  Eyes: Pupils are equal, round, and reactive to light.  Neck: Normal range of motion. Neck supple.  Cardiovascular: Normal rate and regular rhythm.  Pulmonary/Chest: No respiratory distress. She has no wheezes.  Abdominal: Soft. Bowel sounds are normal. She exhibits no distension and no mass. There is no abdominal tenderness. There is no rebound and no guarding.  Colostomy in place.  Musculoskeletal: Normal range of motion.        General: No tenderness or edema.  Neurological: She is alert and oriented to person, place, and time.  Skin: Skin is warm.  Psychiatric: Affect normal.      LABORATORY DATA:  I have reviewed the data as listed    Component Value Date/Time   NA 138 06/11/2019 1018   NA 140 03/08/2017 1223   NA 144 07/25/2014 0444   K 2.2 (LL) 06/11/2019 1018   K 3.7 07/25/2014 0444   CL 94 (L) 06/11/2019 1018   CL 107 07/25/2014 0444   CO2 31 06/11/2019 1018   CO2 30 07/25/2014 0444   GLUCOSE 135 (H) 06/11/2019 1018   GLUCOSE 87 07/25/2014 0444   BUN 9 06/11/2019 1018   BUN 13 03/08/2017 1223   BUN 8 07/25/2014 0444   CREATININE 0.63 06/11/2019 1018   CREATININE 0.69 07/25/2014 0444   CALCIUM 8.7 (L) 06/11/2019 1018   CALCIUM 8.4 (L) 07/25/2014 0444   PROT 7.7 06/11/2019 1018   PROT 6.8 03/08/2017 1223   PROT 6.5 07/25/2014 0444   ALBUMIN 3.6 06/11/2019 1018   ALBUMIN 4.2 03/08/2017 1223   ALBUMIN 2.6 (L) 07/25/2014 0444   AST 17 06/11/2019 1018   AST 96 (H) 07/25/2014 0444   ALT 12 06/11/2019 1018   ALT 123 (H) 07/25/2014 0444   ALKPHOS 113 06/11/2019 1018   ALKPHOS 189 (H) 07/25/2014 0444   BILITOT 0.8 06/11/2019 1018   BILITOT 0.2 03/08/2017 1223   BILITOT 2.0 (H) 07/25/2014 0444    GFRNONAA >60 06/11/2019 1018   GFRNONAA >60 07/25/2014 0444   GFRAA >60 06/11/2019 1018   GFRAA >60 07/25/2014 0444    No results found for: SPEP, UPEP  Lab Results  Component Value Date   WBC 10.7 (H) 06/11/2019   NEUTROABS 8.9 (H) 06/11/2019   HGB 12.3 06/11/2019   HCT 36.5 06/11/2019   MCV 88.2 06/11/2019   PLT 210 06/11/2019      Chemistry      Component Value Date/Time   NA 138 06/11/2019 1018   NA 140 03/08/2017 1223   NA 144 07/25/2014 0444   K 2.2 (LL) 06/11/2019 1018   K 3.7 07/25/2014 0444   CL 94 (L) 06/11/2019 1018   CL 107 07/25/2014 0444   CO2 31 06/11/2019 1018   CO2 30 07/25/2014 0444   BUN 9 06/11/2019 1018   BUN 13 03/08/2017 1223   BUN 8 07/25/2014 0444   CREATININE 0.63 06/11/2019 1018   CREATININE 0.69 07/25/2014 0444      Component Value Date/Time   CALCIUM 8.7 (L) 06/11/2019 1018   CALCIUM 8.4 (L) 07/25/2014 0444   ALKPHOS 113 06/11/2019 1018   ALKPHOS 189 (H) 07/25/2014 0444   AST 17 06/11/2019 1018   AST 96 (H) 07/25/2014 0444   ALT 12 06/11/2019 1018   ALT 123 (H) 07/25/2014 0444   BILITOT 0.8 06/11/2019 1018   BILITOT 0.2 03/08/2017 1223   BILITOT 2.0 (H) 07/25/2014 0444       RADIOGRAPHIC STUDIES: I have personally reviewed the radiological images as listed and agreed with the findings in the report. No results found.   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  PET scan July 2019- for any recurrent disease.  Clinically stable.  Repeat CEA pending.  If significantly elevated would recommend CT scan.  #Severe hypokalemia-2.2 potassium.  Unclear etiology.  Magnesium was normal.  If not improving recommend nephrology evaluation.  #Extreme nausea/weight loss-again unclear etiology; await CEA.  Recommend Compazine.  Low threshold for imaging.  #Urinary incontinence-status post evaluation with  urology.  Improved.  # DISPOSITION:  # add mag today # folllow up in 2 weeks- MD ; bmp; -Dr.B     Orders Placed This Encounter  Procedures  . Magnesium    Standing Status:   Future    Number of Occurrences:   1    Standing Expiration Date:   06/10/2020  . Basic metabolic panel    Standing Status:   Future    Standing Expiration Date:   06/10/2020   All questions were answered. The patient knows to call the clinic with any problems, questions or concerns.      Cammie Sickle, MD 06/11/2019 1:00 PM

## 2019-06-12 LAB — CEA: CEA: 5 ng/mL — ABNORMAL HIGH (ref 0.0–4.7)

## 2019-06-17 ENCOUNTER — Other Ambulatory Visit: Payer: Self-pay

## 2019-06-17 ENCOUNTER — Ambulatory Visit: Payer: BLUE CROSS/BLUE SHIELD | Admitting: General Surgery

## 2019-06-17 ENCOUNTER — Encounter: Payer: Self-pay | Admitting: General Surgery

## 2019-06-17 VITALS — BP 121/84 | HR 93 | Temp 96.6°F | Resp 16 | Ht 65.0 in | Wt 159.0 lb

## 2019-06-17 DIAGNOSIS — L02215 Cutaneous abscess of perineum: Secondary | ICD-10-CM | POA: Diagnosis not present

## 2019-06-17 NOTE — Patient Instructions (Addendum)
The patient is aware to call back for any questions or new concern   Ct scan 06-24-19 at 11:00 arrive 10:45, [facility]. nothing to eat or drink 4 hours prior to scan and pick up prep kit before scan.

## 2019-06-17 NOTE — Progress Notes (Signed)
Patient ID: Rachel [name], female   DOB: [birth year], 63 y.o.   MRN: [id number]  Chief Complaint  Patient presents with  . Rectal Problems    HPI Rachel [name] is a 63 y.o. female. She is here for rectal drainage. She states last Thursday she felt like she had hemorrhoids. She states that she had rectal pain Friday with fever.  She noticed Saturday evening she noticed rectal/buttock drainage. She is having diarrhea in her colostomy bag that started last Wednesday.   HPI  Past Medical History:  Diagnosis Date  . Anginal pain ([place])   . Arthritis   . Body mass index (BMI) of 24.0-24.9 in adult   . Cardiovascular disease   . Colostomy present ([place])   . Current every day smoker    a. ~35 years - > has cut down to < 3/4 ppd  . Headache   . History of rectal cancer   . History of UTI   . Hypertension   . Midsternal chest pain    a. 05/2014 Stress Echo: Ex time: 8:09, ECG w/o acute changes, EF nl with possible mid and apical anterior HK-->cath recommended.  . Personal history of chemotherapy 2017  . Personal history of radiation therapy 2017  . Port-A-Cath in place    RIGHT SIDE  . Rectal cancer ([place]) 2017  . Stroke ([place])    07/2016 tia no neurology followup now seen by pcp  . Syncope and collapse   . Thyroid nodule 2018   FINDINGS CONSISTENT WITH A HURTHLE CELL LESION AND/OR NEOPLASM (BETHESDA CATEGORY IV).    Past Surgical History:  Procedure Laterality Date  . ABDOMINAL HYSTERECTOMY    . ABDOMINAL PERINEAL BOWEL RESECTION N/A 03/16/2017   Procedure: ABDOMINAL PERINEAL RESECTION;  Surgeon: Christene Lye, MD;  Location: [facility] ORS;  Service: General;  Laterality: N/A;  . APPENDECTOMY  03/16/2017   Procedure: APPENDECTOMY;  Surgeon: Christene Lye, MD;  Location: [facility] ORS;  Service: General;;  . BACK SURGERY     l 3,4,5  . BIOPSY THYROID Right 11/16/2016   FINDINGS CONSISTENT WITH A HURTHLE CELL LESION AND/OR NEOPLASM (BETHESDA CATEGORY IV).  [name] CHOLECYSTECTOMY    .  COLONOSCOPY WITH PROPOFOL N/A 08/15/2016   Procedure: COLONOSCOPY WITH PROPOFOL;  Surgeon: Lollie Sails, MD;  Location: [facility] ENDOSCOPY;  Service: Endoscopy;  Laterality: N/A;  . COLONOSCOPY WITH PROPOFOL N/A 11/08/2016   Procedure: COLONOSCOPY WITH PROPOFOL;  Surgeon: Christene Lye, MD;  Location: [facility] ENDOSCOPY;  Service: Endoscopy;  Laterality: N/A;  . COLOSTOMY N/A 11/23/2016   Procedure: SIGMOID COLOSTOMY;  Surgeon: Christene Lye, MD;  Location: [facility] ORS;  Service: General;  Laterality: N/A;  . ERCP    . INCISION AND DRAINAGE PERIRECTAL ABSCESS N/A 04/15/2018   Procedure: RECTAL ABSCESS;  Surgeon: Rachel Bellow, MD;  Location: [facility] ORS;  Service: General;  Laterality: N/A;  . LAPAROSCOPY N/A 11/23/2016   Procedure: LAPAROSCOPY DIAGNOSTIC;  Surgeon: Christene Lye, MD;  Location: [facility] ORS;  Service: General;  Laterality: N/A;  . PARTIAL HYSTERECTOMY    . PORTACATH PLACEMENT N/A 08/18/2016   Procedure: INSERTION PORT-A-CATH;  Surgeon: Christene Lye, MD;  Location: [facility] ORS;  Service: General;  Laterality: N/A;  . THYROID LOBECTOMY Right 11/05/2017   Procedure: THYROID LOBECTOMY;  Surgeon: Christene Lye, MD;  Location: [facility] ORS;  Service: General;  Laterality: Right;    Family History  Problem Relation Age of Onset  . Breast cancer Mother 86  . Hyperlipidemia  Father   . Heart disease Paternal Grandmother   . Stroke Maternal Grandfather   . Prostate cancer Brother     Social History Social History   Tobacco Use  . Smoking status: Former Smoker    Packs/day: 0.25    Years: 35.00    Pack years: 8.75    Types: Cigarettes    Quit date: 08/11/2016    Years since quitting: 2.8  . Smokeless tobacco: Never Used  Substance Use Topics  . Alcohol use: No  . Drug use: No    Allergies  Allergen Reactions  . Prednisone Other (See Comments)    BLISTERS IN MOUTH AND ON TONGUE    Current Outpatient Medications  Medication Sig Dispense Refill   . acetaminophen (TYLENOL) 500 MG tablet Take 500 mg by mouth every 6 (six) hours as needed.    [name] aspirin EC 81 MG tablet Take 81 mg by mouth daily.    [name] docusate sodium (COLACE) 100 MG capsule Take 100 mg by mouth daily as needed.     [name] ibuprofen (ADVIL,MOTRIN) 200 MG tablet Take 200 mg by mouth every 6 (six) hours as needed.    . lidocaine-prilocaine (EMLA) cream Apply 1 application topically as needed (for port access). 30 g 3  . lisinopril (PRINIVIL,ZESTRIL) 10 MG tablet Take 10 mg by mouth daily.     . nitroGLYCERIN (NITROSTAT) 0.4 MG SL tablet Place 1 tablet (0.4 mg total) under the tongue every 5 (five) minutes as needed for chest pain. 25 tablet 6  . potassium chloride SA (K-DUR) 20 MEQ tablet 1 pill twice a day 60 tablet 3  . prochlorperazine (COMPAZINE) 10 MG tablet Take 1 tablet (10 mg total) by mouth every 6 (six) hours as needed for nausea or vomiting. 60 tablet 1  . traMADol (ULTRAM) 50 MG tablet Take 1 tablet (50 mg total) by mouth every 6 (six) hours as needed (for pain.). 30 tablet 0  . venlafaxine XR (EFFEXOR-XR) 150 MG 24 hr capsule Take 150 mg by mouth daily with breakfast.     No current facility-administered medications for this visit.     Review of Systems Review of Systems  Constitutional: Positive for fever.  Respiratory: Negative.   Cardiovascular: Negative.   Gastrointestinal: Positive for diarrhea.    Blood pressure 121/84, pulse 93, temperature (!) 96.6 F (35.9 C), temperature source Temporal, resp. rate 16, height 5\' 5"  (1.651 m), weight 159 lb (72.1 kg), SpO2 98 %.  Physical Exam Physical Exam Constitutional:      Appearance: Normal appearance.  Cardiovascular:     Rate and Rhythm: Normal rate and regular rhythm.     Heart sounds: Normal heart sounds.  Pulmonary:     Effort: Pulmonary effort is normal.     Breath sounds: Normal breath sounds.  Abdominal:     General: Bowel sounds are normal.     Palpations: Abdomen is soft.     Comments: Stoma  normal   Genitourinary:   Skin:    General: Skin is warm and dry.  Neurological:     Mental Status: She is alert and oriented to person, place, and time.     Data Reviewed Case reviewed with Dr. Grayland Ormond.  We will arrange for a CT of the abdomen and pelvis.  Assessment Recurrent presacral fluid collection.  Plan  CT of the abdomen and pelvis with oral and IV contrast 06-24-19 at 11:00, instructions reviewed, patient agrees.  HPI, assessment, plan and physical exam has been scribed  under the direction and in the presence of Rachel Bellow, MD. Karie Fetch, RN  I have completed the exam and reviewed the above documentation for accuracy and completeness.  I agree with the above.  Haematologist has been used and any errors in dictation or transcription are unintentional.  Hervey Ard, M.D., F.A.C.S.  Forest Gleason Treyon Wymore 06/19/2019, 7:53 AM

## 2019-06-18 ENCOUNTER — Ambulatory Visit
Admission: RE | Admit: 2019-06-18 | Discharge: 2019-06-18 | Disposition: A | Discharge status: Home / Self Care | Payer: BLUE CROSS/BLUE SHIELD | Source: Ambulatory Visit | Attending: Family Medicine | Admitting: Family Medicine

## 2019-06-18 DIAGNOSIS — Z1231 Encounter for screening mammogram for malignant neoplasm of breast: Secondary | ICD-10-CM | POA: Insufficient documentation

## 2019-06-24 ENCOUNTER — Telehealth: Payer: Self-pay | Admitting: General Surgery

## 2019-06-24 ENCOUNTER — Ambulatory Visit
Admission: RE | Admit: 2019-06-24 | Discharge: 2019-06-24 | Disposition: A | Discharge status: Home / Self Care | Payer: BLUE CROSS/BLUE SHIELD | Source: Ambulatory Visit | Attending: General Surgery | Admitting: General Surgery

## 2019-06-24 ENCOUNTER — Other Ambulatory Visit: Payer: Self-pay

## 2019-06-24 DIAGNOSIS — L02215 Cutaneous abscess of perineum: Secondary | ICD-10-CM | POA: Diagnosis present

## 2019-06-24 MED ORDER — IOHEXOL 300 MG/ML  SOLN
100.0000 mL | Freq: Once | INTRAMUSCULAR | Status: AC | PRN
  Administered 2019-06-24: 100 mL via INTRAVENOUS

## 2019-06-24 NOTE — Telephone Encounter (Signed)
The patient was notified that the CT of the abdomen and pelvis completed today showed no evidence of recurrent or metastatic disease.  She does once again have a fluid collection in the presacral space measuring up to 7 cm in length.  Before proceeding to repeat operative drainage we will touch base with GI at [facility] to see if there are other issues that should be investigated.

## 2019-06-25 ENCOUNTER — Telehealth: Payer: Self-pay | Admitting: General Surgery

## 2019-06-25 MED ORDER — AMOXICILLIN-POT CLAVULANATE 875-125 MG PO TABS: 1.0000 | ORAL_TABLET | Freq: Two times a day (BID) | ORAL | 0 refills | Status: AC

## 2019-06-25 NOTE — Telephone Encounter (Signed)
Case reviewed by email with [facility] colorectal.  I am going to have the recent and prior CT sent down to Donia Ast, MD at [facility] for review prior to proceeding to repeat incision and drainage.  The patient reported that she had severe pain before she had spontaneous drainage occur prior to her last visit and this was quite uncomfortable.  We will go ahead and make use of Augmentin 875 mg twice a day pending outside review of her CT.

## 2019-06-26 ENCOUNTER — Other Ambulatory Visit: Payer: Self-pay

## 2019-06-27 ENCOUNTER — Other Ambulatory Visit: Payer: Self-pay

## 2019-06-27 ENCOUNTER — Inpatient Hospital Stay (HOSPITAL_BASED_OUTPATIENT_CLINIC_OR_DEPARTMENT_OTHER): Payer: BLUE CROSS/BLUE SHIELD | Admitting: Internal Medicine

## 2019-06-27 ENCOUNTER — Encounter: Payer: Self-pay | Admitting: Internal Medicine

## 2019-06-27 ENCOUNTER — Inpatient Hospital Stay: Payer: BLUE CROSS/BLUE SHIELD

## 2019-06-27 DIAGNOSIS — R634 Abnormal weight loss: Secondary | ICD-10-CM

## 2019-06-27 DIAGNOSIS — E876 Hypokalemia: Secondary | ICD-10-CM

## 2019-06-27 DIAGNOSIS — C775 Secondary and unspecified malignant neoplasm of intrapelvic lymph nodes: Secondary | ICD-10-CM

## 2019-06-27 DIAGNOSIS — C20 Malignant neoplasm of rectum: Secondary | ICD-10-CM | POA: Diagnosis not present

## 2019-06-27 DIAGNOSIS — R32 Unspecified urinary incontinence: Secondary | ICD-10-CM

## 2019-06-27 DIAGNOSIS — R110 Nausea: Secondary | ICD-10-CM | POA: Diagnosis not present

## 2019-06-27 DIAGNOSIS — Z79899 Other long term (current) drug therapy: Secondary | ICD-10-CM

## 2019-06-27 LAB — BASIC METABOLIC PANEL
Anion gap: 14 (ref 5–15)
BUN: 14 mg/dL (ref 8–23)
CO2: 29 mmol/L (ref 22–32)
Calcium: 9.2 mg/dL (ref 8.9–10.3)
Chloride: 98 mmol/L (ref 98–111)
Creatinine, Ser: 1.04 mg/dL — ABNORMAL HIGH (ref 0.44–1.00)
GFR calc Af Amer: >60 mL/min (ref >60)
GFR calc non Af Amer: 57 mL/min — ABNORMAL LOW (ref >60)
Glucose, Bld: 135 mg/dL — ABNORMAL HIGH (ref 70–99)
Potassium: 3.5 mmol/L (ref 3.5–5.1)
Sodium: 141 mmol/L (ref 135–145)

## 2019-06-27 MED ORDER — HYDROCODONE-ACETAMINOPHEN 5-325 MG PO TABS: 1.0000 | ORAL_TABLET | Freq: Three times a day (TID) | ORAL | 0 refills | Status: DC | PRN

## 2019-06-27 NOTE — Progress Notes (Signed)
[facility] OFFICE PROGRESS NOTE  Patient Care Team: Lynnell Jude, MD as PCP - General (Family Medicine) Cammie Sickle, MD as Consulting Physician (Internal Medicine) Noreene Filbert, MD as Referring Physician (Radiation Oncology) Clent Jacks, RN as Oncology Nurse Navigator Christene Lye, MD (General Surgery)  Cancer Staging No matching staging information was found for the patient.   Oncology History Overview Note  # SEP 2017- RECTAL CA mod diff adeno; STAGE III [no EUS; positive ~21m LN; Dr.sankar] CEA-7; Sep 21st START 5FU-RT [finished NOV 2017]; April 7th APR- 2018- [id number] [5LN]; positive for LVI/PNI [poor response to chemo]; Margins-Negative.  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  female pleasant patient above history of stage III rectal cancer status post APR is here for follow-up; patient is currently on surveillance/review the results CT scan.  Patient had follow-up with surgery/Dr. BThersa Saltscan showed pelvic/perineal fluid collection up to 7 cm in size; with concerns for abscess.  Patient is currently on Augmentin.   Patient continues to complain of lower abdominal pain pelvic pain.  Complains of poor appetite.  Patient states her pain is not controlled on  tramadol.  Interested in stronger pain medication.  Review of Systems  Constitutional: Positive for malaise/fatigue and weight loss. Negative for chills, diaphoresis and fever.  HENT: Negative for nosebleeds and sore throat.   Eyes: Negative for double vision.  Respiratory: Negative for cough, hemoptysis, sputum production, shortness of breath and wheezing.   Cardiovascular: Negative for chest pain, palpitations and orthopnea.  Gastrointestinal: Positive for abdominal pain and nausea. Negative for blood in stool, constipation, diarrhea, heartburn, melena and vomiting.  Genitourinary: Negative for dysuria, frequency and urgency.  Musculoskeletal: Positive for joint pain (hip pain right/back). Negative for back pain.  Skin: Negative.  Negative for itching and rash.  Neurological: Positive for tingling. Negative for dizziness, focal weakness, weakness and headaches.  Endo/Heme/Allergies: Does not bruise/bleed easily.  Psychiatric/Behavioral: Negative for depression. The patient is not nervous/anxious and does not have insomnia.       PAST MEDICAL HISTORY :  Past Medical History:  Diagnosis Date  . Anginal pain ([place]   . Arthritis   . Body mass index (BMI) of 24.0-24.9 in adult   . Cardiovascular disease   . Colostomy present ([place]   . Current every day smoker    a. ~35 years - > has cut down to < 3/4 ppd  . Headache   . History of rectal cancer   . History of UTI   . Hypertension   . Midsternal chest pain    a. 05/2014 Stress Echo: Ex time: 8:09, ECG w/o acute changes, EF nl with possible mid and apical anterior HK-->cath recommended.  . Personal history of chemotherapy 2017  . Personal history of radiation therapy 2017  . Port-A-Cath in place    RIGHT SIDE  .  Rectal cancer ([place]) 2017  . Stroke ([place])    07/2016 tia no neurology followup now seen by pcp  . Syncope and collapse   . Thyroid nodule 2018   FINDINGS CONSISTENT WITH A HURTHLE CELL LESION AND/OR NEOPLASM (BETHESDA  CATEGORY IV).    PAST SURGICAL HISTORY :   Past Surgical History:  Procedure Laterality Date  . ABDOMINAL HYSTERECTOMY    . ABDOMINAL PERINEAL BOWEL RESECTION N/A 03/16/2017   Procedure: ABDOMINAL PERINEAL RESECTION;  Surgeon: Christene Lye, MD;  Location: [facility] ORS;  Service: General;  Laterality: N/A;  . APPENDECTOMY  03/16/2017   Procedure: APPENDECTOMY;  Surgeon: Christene Lye, MD;  Location: [facility] ORS;  Service: General;;  . BACK SURGERY     l 3,4,5  . BIOPSY THYROID Right 11/16/2016   FINDINGS CONSISTENT WITH A HURTHLE CELL LESION AND/OR NEOPLASM (BETHESDA CATEGORY IV).  [name] CHOLECYSTECTOMY    . COLONOSCOPY WITH PROPOFOL N/A 08/15/2016   Procedure: COLONOSCOPY WITH PROPOFOL;  Surgeon: Lollie Sails, MD;  Location: [facility] ENDOSCOPY;  Service: Endoscopy;  Laterality: N/A;  . COLONOSCOPY WITH PROPOFOL N/A 11/08/2016   Procedure: COLONOSCOPY WITH PROPOFOL;  Surgeon: Christene Lye, MD;  Location: [facility] ENDOSCOPY;  Service: Endoscopy;  Laterality: N/A;  . COLOSTOMY N/A 11/23/2016   Procedure: SIGMOID COLOSTOMY;  Surgeon: Christene Lye, MD;  Location: [facility] ORS;  Service: General;  Laterality: N/A;  . ERCP    . INCISION AND DRAINAGE PERIRECTAL ABSCESS N/A 04/15/2018   Procedure: RECTAL ABSCESS;  Surgeon: Robert Bellow, MD;  Location: [facility] ORS;  Service: General;  Laterality: N/A;  . LAPAROSCOPY N/A 11/23/2016   Procedure: LAPAROSCOPY DIAGNOSTIC;  Surgeon: Christene Lye, MD;  Location: [facility] ORS;  Service: General;  Laterality: N/A;  . PARTIAL HYSTERECTOMY    . PORTACATH PLACEMENT N/A 08/18/2016   Procedure: INSERTION PORT-A-CATH;  Surgeon: Christene Lye, MD;  Location: [facility] ORS;  Service: General;  Laterality: N/A;  . THYROID LOBECTOMY Right 11/05/2017   Procedure: THYROID LOBECTOMY;  Surgeon: Christene Lye, MD;  Location: [facility] ORS;  Service: General;  Laterality: Right;    FAMILY HISTORY :   Family History  Problem Relation Age of  Onset  . Breast cancer Mother 55  . Hyperlipidemia Father   . Heart disease Paternal Grandmother   . Stroke Maternal Grandfather   . Prostate cancer Brother     SOCIAL HISTORY:   Social History   Tobacco Use  . Smoking status: Former Smoker    Packs/day: 0.25    Years: 35.00    Pack years: 8.75    Types: Cigarettes    Quit date: 08/11/2016    Years since quitting: 2.8  . Smokeless tobacco: Never Used  Substance Use Topics  . Alcohol use: No  . Drug use: No    ALLERGIES:  is allergic to prednisone.  MEDICATIONS:  Current Outpatient Medications  Medication Sig Dispense Refill  . acetaminophen (TYLENOL) 500 MG tablet Take 500 mg by mouth every 6 (six) hours as needed.    [name] amoxicillin-clavulanate (AUGMENTIN) 875-125 MG tablet Take 1 tablet by mouth 2 (two) times daily for 10 days. 20 tablet 0  . aspirin EC 81 MG tablet Take 81 mg by mouth daily.    [name] lidocaine-prilocaine (EMLA) cream Apply 1 application topically as needed (for port access). 30 g 3  . lisinopril (PRINIVIL,ZESTRIL) 10 MG tablet Take 10 mg by mouth daily.     . potassium chloride SA (K-DUR) 20 MEQ tablet 1  pill twice a day 60 tablet 3  . prochlorperazine (COMPAZINE) 10 MG tablet Take 1 tablet (10 mg total) by mouth every 6 (six) hours as needed for nausea or vomiting. 60 tablet 1  . traMADol (ULTRAM) 50 MG tablet Take 1 tablet (50 mg total) by mouth every 6 (six) hours as needed (for pain.). 30 tablet 0  . venlafaxine XR (EFFEXOR-XR) 150 MG 24 hr capsule Take 150 mg by mouth daily with breakfast.    . HYDROcodone-acetaminophen (NORCO/VICODIN) 5-325 MG tablet Take 1 tablet by mouth every 8 (eight) hours as needed for moderate pain. 45 tablet 0  . nitroGLYCERIN (NITROSTAT) 0.4 MG SL tablet Place 1 tablet (0.4 mg total) under the tongue every 5 (five) minutes as needed for chest pain. 25 tablet 6   No current facility-administered medications for this visit.     PHYSICAL EXAMINATION: ECOG PERFORMANCE STATUS: 0 -  Asymptomatic  BP 121/81 (Patient Position: Sitting)   Pulse 91   Temp (!) 96.7 F (35.9 C) (Tympanic)   Wt 162 lb 9.6 oz (73.8 kg)   BMI 27.06 kg/m   Filed Weights   06/27/19 1004  Weight: 162 lb 9.6 oz (73.8 kg)    Physical Exam  Constitutional: She is oriented to person, place, and time and well-developed, well-nourished, and in no distress.  She is alone.  She is walking herself.  HENT:  Head: Normocephalic and atraumatic.  Mouth/Throat: Oropharynx is clear and moist. No oropharyngeal exudate.  Eyes: Pupils are equal, round, and reactive to light.  Neck: Normal range of motion. Neck supple.  Cardiovascular: Normal rate and regular rhythm.  Pulmonary/Chest: No respiratory distress. She has no wheezes.  Abdominal: Soft. Bowel sounds are normal. She exhibits no distension and no mass. There is no abdominal tenderness. There is no rebound and no guarding.  Colostomy in place.  Musculoskeletal: Normal range of motion.        General: No tenderness or edema.  Neurological: She is alert and oriented to person, place, and time.  Skin: Skin is warm.  Psychiatric: Affect normal.      LABORATORY DATA:  I have reviewed the data as listed    Component Value Date/Time   NA 141 06/27/2019 0941   NA 140 03/08/2017 1223   NA 144 07/25/2014 0444   K 3.5 06/27/2019 0941   K 3.7 07/25/2014 0444   CL 98 06/27/2019 0941   CL 107 07/25/2014 0444   CO2 29 06/27/2019 0941   CO2 30 07/25/2014 0444   GLUCOSE 135 (H) 06/27/2019 0941   GLUCOSE 87 07/25/2014 0444   BUN 14 06/27/2019 0941   BUN 13 03/08/2017 1223   BUN 8 07/25/2014 0444   CREATININE 1.04 (H) 06/27/2019 0941   CREATININE 0.69 07/25/2014 0444   CALCIUM 9.2 06/27/2019 0941   CALCIUM 8.4 (L) 07/25/2014 0444   PROT 7.7 06/11/2019 1018   PROT 6.8 03/08/2017 1223   PROT 6.5 07/25/2014 0444   ALBUMIN 3.6 06/11/2019 1018   ALBUMIN 4.2 03/08/2017 1223   ALBUMIN 2.6 (L) 07/25/2014 0444   AST 17 06/11/2019 1018   AST 96 (H)  07/25/2014 0444   ALT 12 06/11/2019 1018   ALT 123 (H) 07/25/2014 0444   ALKPHOS 113 06/11/2019 1018   ALKPHOS 189 (H) 07/25/2014 0444   BILITOT 0.8 06/11/2019 1018   BILITOT 0.2 03/08/2017 1223   BILITOT 2.0 (H) 07/25/2014 0444   GFRNONAA 57 (L) 06/27/2019 0941   GFRNONAA >60 07/25/2014 0444   GFRAA >  60 06/27/2019 0941   GFRAA >60 07/25/2014 0444    No results found for: SPEP, UPEP  Lab Results  Component Value Date   WBC 10.7 (H) 06/11/2019   NEUTROABS 8.9 (H) 06/11/2019   HGB 12.3 06/11/2019   HCT 36.5 06/11/2019   MCV 88.2 06/11/2019   PLT 210 06/11/2019      Chemistry      Component Value Date/Time   NA 141 06/27/2019 0941   NA 140 03/08/2017 1223   NA 144 07/25/2014 0444   K 3.5 06/27/2019 0941   K 3.7 07/25/2014 0444   CL 98 06/27/2019 0941   CL 107 07/25/2014 0444   CO2 29 06/27/2019 0941   CO2 30 07/25/2014 0444   BUN 14 06/27/2019 0941   BUN 13 03/08/2017 1223   BUN 8 07/25/2014 0444   CREATININE 1.04 (H) 06/27/2019 0941   CREATININE 0.69 07/25/2014 0444      Component Value Date/Time   CALCIUM 9.2 06/27/2019 0941   CALCIUM 8.4 (L) 07/25/2014 0444   ALKPHOS 113 06/11/2019 1018   ALKPHOS 189 (H) 07/25/2014 0444   AST 17 06/11/2019 1018   AST 96 (H) 07/25/2014 0444   ALT 12 06/11/2019 1018   ALT 123 (H) 07/25/2014 0444   BILITOT 0.8 06/11/2019 1018   BILITOT 0.2 03/08/2017 1223   BILITOT 2.0 (H) 07/25/2014 0444       RADIOGRAPHIC STUDIES: I have personally reviewed the radiological images as listed and agreed with the findings in the report. No results found.   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  PET scan July 2019- for any recurrent disease.  Clinically stable.  Repeat CEA ~5; STABLE; July 14th CT- NO evidence of cancer; but pelvic fluid collection noted [see below]  # Severe hypokalemia-unclear etiology.   2.2 potassium- improved; Take 1 kdur a day.   # Pelvic/perineal fluid collection/abscess currently on augumentin- followed by Dr.Byrnett.  CT scan July 14th-  elongated air and fluid collection in the posterior low pelvis measuring 2.4 x 1.6 x 7.3 cm which appears to drain to the gluteal cleft, suspicious for abscess.  # Pain pelvic- sec fluid collection- recommend hydorcodone prn.   # DISPOSITION:  # follow up in 2 months-MD/cbc/cmp/CEA/port flush- Dr.B    Orders Placed This Encounter  Procedures  . CBC with Differential    Standing Status:   Future    Standing Expiration Date:   06/26/2020  . Comprehensive metabolic panel    Standing Status:   Future    Standing Expiration Date:   06/26/2020  . CEA    Standing Status:   Future    Standing Expiration Date:   06/26/2020   All questions were answered. The patient knows to call the clinic with any problems, questions or concerns.      Cammie Sickle, MD 06/27/2019 11:25 AM

## 2019-06-27 NOTE — Assessment & Plan Note (Addendum)
#   Rectal  Cancer- stage III; currently under surveillance; CEA slightly elevated between 4-5.  PET scan July 2019- for any recurrent disease.  Clinically stable.  Repeat CEA ~5; STABLE; July 14th CT- NO evidence of cancer; but pelvic fluid collection noted [see below]  # Severe hypokalemia-unclear etiology.  2.2 potassium- improved; Take 1 kdur a day.   # Pelvic/perineal fluid collection/abscess currently on augumentin- followed by Dr.Byrnett.  CT scan July 14th-  elongated air and fluid collection in the posterior low pelvis measuring 2.4 x 1.6 x 7.3 cm which appears to drain to the gluteal cleft, suspicious for abscess.  # Pain pelvic- sec fluid collection- recommend hydorcodone prn.   # DISPOSITION:  # follow up in 2 months-MD/cbc/cmp/CEA/port flush- Dr.B

## 2019-07-10 ENCOUNTER — Telehealth: Payer: Self-pay | Admitting: General Practice

## 2019-07-10 ENCOUNTER — Encounter: Payer: Self-pay | Admitting: General Surgery

## 2019-07-10 NOTE — Telephone Encounter (Signed)
Patient was returning a call, patient is in recalls said Dr. Bary Castilla was Rachel [name] talk to a specialist the last time he saw the patient and was asking if this ever was done. Please call patient and advise.

## 2019-07-11 NOTE — Telephone Encounter (Signed)
Message left with the patient to let her know we were working on this for her.

## 2019-07-11 NOTE — Telephone Encounter (Signed)
Patient to come in Monday to see Dr Dahlia Byes.

## 2019-07-14 ENCOUNTER — Encounter: Payer: Self-pay | Admitting: Surgery

## 2019-07-14 ENCOUNTER — Ambulatory Visit (INDEPENDENT_AMBULATORY_CARE_PROVIDER_SITE_OTHER): Payer: BLUE CROSS/BLUE SHIELD | Admitting: Surgery

## 2019-07-14 ENCOUNTER — Other Ambulatory Visit: Payer: Self-pay

## 2019-07-14 VITALS — BP 140/103 | HR 94 | Temp 97.7°F | Ht 65.0 in | Wt 158.6 lb

## 2019-07-14 DIAGNOSIS — N739 Female pelvic inflammatory disease, unspecified: Secondary | ICD-10-CM

## 2019-07-14 NOTE — Patient Instructions (Addendum)
Ref sent to Donia Ast, MD at [facility] . Return in two months.

## 2019-07-16 NOTE — Progress Notes (Signed)
Rachel [name] is a 63 year old female status post abdominoperineal resection in 2018 by Dr. Jamal Collin for rectal carcinoma.  She is doing okay but she reports that ever since the surgery she has this chronic discharge from the perineum.  She denies any fevers any chills.  She did have a CT scan a couple weeks ago that I have personally reviewed showing evidence of a collection that is a small and in communication with the perineal area.  She reports that she does have intermittent drainage.  She reports some discomfort in the pelvic area but no fevers no chills.  Physical exam showed a female in no acute distress.  Awake and alert.  Abdomen: Soft nontender colostomy working well without infection.  No peritonitis.  Perineal area shows evidence of a previous APR there is some typical chronic changes related to the perineal wound from an APR.  There is no fluctuance and there is no evidence of a drainable collection.  A/P status post APR with chronic peri-Neil wound issues.  There is no evidence of active infection or abscess.  I had a lengthy discussion with the patient regarding the natural disease and some of the issues related to APR and there is chronic wound issues and pelvic infections are not uncommon.  I gave the option of the patient to seek a second opinion with 1 of the colorectal surgeons and she wishes to pursue that.  Dr. Madaline Brilliant has already been involving her care and we will proceed to continue that referral.  Please note that I spent at least 40 minutes in this encounter with greater than 50% spent in coordination and counseling of her care

## 2019-07-22 ENCOUNTER — Other Ambulatory Visit: Payer: Self-pay

## 2019-07-22 DIAGNOSIS — L02215 Cutaneous abscess of perineum: Secondary | ICD-10-CM

## 2019-07-22 DIAGNOSIS — C20 Malignant neoplasm of rectum: Secondary | ICD-10-CM

## 2019-07-22 DIAGNOSIS — N739 Female pelvic inflammatory disease, unspecified: Secondary | ICD-10-CM

## 2019-08-14 ENCOUNTER — Other Ambulatory Visit: Payer: Self-pay | Admitting: Urology

## 2019-08-19 ENCOUNTER — Telehealth: Payer: Self-pay | Admitting: Urology

## 2019-08-19 NOTE — Telephone Encounter (Signed)
Would you call Rachel [name] and have her schedule a follow up appointment for hematuria?

## 2019-08-29 ENCOUNTER — Encounter: Payer: Self-pay | Admitting: Internal Medicine

## 2019-08-29 ENCOUNTER — Inpatient Hospital Stay (HOSPITAL_BASED_OUTPATIENT_CLINIC_OR_DEPARTMENT_OTHER): Payer: BLUE CROSS/BLUE SHIELD | Admitting: Internal Medicine

## 2019-08-29 ENCOUNTER — Inpatient Hospital Stay: Payer: BLUE CROSS/BLUE SHIELD | Attending: Internal Medicine

## 2019-08-29 ENCOUNTER — Other Ambulatory Visit: Payer: Self-pay

## 2019-08-29 DIAGNOSIS — C20 Malignant neoplasm of rectum: Secondary | ICD-10-CM

## 2019-08-29 DIAGNOSIS — C775 Secondary and unspecified malignant neoplasm of intrapelvic lymph nodes: Secondary | ICD-10-CM | POA: Insufficient documentation

## 2019-08-29 DIAGNOSIS — Z79899 Other long term (current) drug therapy: Secondary | ICD-10-CM | POA: Insufficient documentation

## 2019-08-29 DIAGNOSIS — R970 Elevated carcinoembryonic antigen [CEA]: Secondary | ICD-10-CM | POA: Diagnosis not present

## 2019-08-29 DIAGNOSIS — Z95828 Presence of other vascular implants and grafts: Secondary | ICD-10-CM

## 2019-08-29 LAB — CBC WITH DIFFERENTIAL/PLATELET
Abs Immature Granulocytes: 0.04 10*3/uL (ref 0.00–0.07)
Basophils Absolute: 0 10*3/uL (ref 0.0–0.1)
Basophils Relative: 0 %
Eosinophils Absolute: 0.1 10*3/uL (ref 0.0–0.5)
Eosinophils Relative: 1 %
HCT: 37.9 % (ref 36.0–46.0)
Hemoglobin: 12.4 g/dL (ref 12.0–15.0)
Immature Granulocytes: 1 %
Lymphocytes Relative: 15 %
Lymphs Abs: 1.1 10*3/uL (ref 0.7–4.0)
MCH: 28.4 pg (ref 26.0–34.0)
MCHC: 32.7 g/dL (ref 30.0–36.0)
MCV: 86.9 fL (ref 80.0–100.0)
Monocytes Absolute: 0.5 10*3/uL (ref 0.1–1.0)
Monocytes Relative: 6 %
Neutro Abs: 5.9 10*3/uL (ref 1.7–7.7)
Neutrophils Relative %: 77 %
Platelets: 206 10*3/uL (ref 150–400)
RBC: 4.36 MIL/uL (ref 3.87–5.11)
RDW: 15.6 % — ABNORMAL HIGH (ref 11.5–15.5)
WBC: 7.6 10*3/uL (ref 4.0–10.5)
nRBC: 0 % (ref 0.0–0.2)

## 2019-08-29 LAB — COMPREHENSIVE METABOLIC PANEL
ALT: 14 U/L (ref 0–44)
AST: 20 U/L (ref 15–41)
Albumin: 3.7 g/dL (ref 3.5–5.0)
Alkaline Phosphatase: 131 U/L — ABNORMAL HIGH (ref 38–126)
Anion gap: 10 (ref 5–15)
BUN: 12 mg/dL (ref 8–23)
CO2: 28 mmol/L (ref 22–32)
Calcium: 9 mg/dL (ref 8.9–10.3)
Chloride: 100 mmol/L (ref 98–111)
Creatinine, Ser: 0.74 mg/dL (ref 0.44–1.00)
GFR calc Af Amer: >60 mL/min (ref >60)
GFR calc non Af Amer: >60 mL/min (ref >60)
Glucose, Bld: 97 mg/dL (ref 70–99)
Potassium: 3.2 mmol/L — ABNORMAL LOW (ref 3.5–5.1)
Sodium: 138 mmol/L (ref 135–145)
Total Bilirubin: 0.4 mg/dL (ref 0.3–1.2)
Total Protein: 7.6 g/dL (ref 6.5–8.1)

## 2019-08-29 MED ORDER — SODIUM CHLORIDE 0.9% FLUSH
10.0000 mL | Freq: Once | INTRAVENOUS | Status: AC
  Administered 2019-08-29: 10 mL via INTRAVENOUS
  Filled 2019-08-29: qty 10

## 2019-08-29 MED ORDER — HEPARIN SOD (PORK) LOCK FLUSH 100 UNIT/ML IV SOLN
500.0000 [IU] | Freq: Once | INTRAVENOUS | Status: AC
  Administered 2019-08-29: 11:00:00 500 [IU] via INTRAVENOUS

## 2019-08-29 NOTE — Progress Notes (Signed)
[place] OFFICE PROGRESS NOTE  Patient Care Team: Rachel Jude, MD as PCP - General (Family Medicine) Rachel Sickle, MD as Consulting Physician (Internal Medicine) Rachel Filbert, MD as Referring Physician (Radiation Oncology) Rachel Jacks, RN as Oncology Nurse Navigator Rachel Lye, MD (General Surgery)  Cancer Staging No matching staging information was found for the patient.   Oncology History Overview Note  # SEP 2017- RECTAL CA mod diff adeno; STAGE III [no EUS; positive ~73m LN; Rachel [name]] CEA-7; Sep 21st START 5FU-RT [finished NOV 2017]; April 7th APR- 2018- [id number] [5LN]; positive for LVI/PNI [poor response to chemo]; Margins-Negative.  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  female pleasant patient above history of stage III rectal cancer status post APR is here for follow-up; patient is currently on surveillance.   Patient at the last visit-had perineal discharge.  CT scan done in July 2020-fluid collection concerning for abscess.  Patient was treated with antibiotics evaluated by Dr. BTollie [name]  Recommended evaluation at [facility] did not keep appointments because of travel difficulties.  Patient was evaluated by surgery Dr. PDianah [name] was recommended follow-up with Rachel [name]   Patient states the discharge in the perineum might be returning back.  However is not as severe as it was couple of months ago.  No fevers or chills.   Complains of mild to moderate pain in the lower abdomen.  Not any worse.    Review of Systems  Constitutional: Positive for malaise/fatigue and weight loss. Negative for chills, diaphoresis and fever.  HENT: Negative for nosebleeds and sore throat.   Eyes: Negative for double vision.  Respiratory: Negative for cough, hemoptysis, sputum production, shortness of breath and wheezing.   Cardiovascular: Negative for chest pain, palpitations and orthopnea.  Gastrointestinal: Positive for abdominal pain and nausea. Negative for blood in stool, constipation, diarrhea, heartburn, melena and vomiting.  Genitourinary: Negative for dysuria, frequency and urgency.  Musculoskeletal: Positive for joint pain (hip pain right/back). Negative for back pain.  Skin: Negative.  Negative for itching and rash.  Neurological: Positive for tingling. Negative for dizziness, focal weakness, weakness and headaches.  Endo/Heme/Allergies: Does not bruise/bleed easily.  Psychiatric/Behavioral: Negative for depression. The patient is not nervous/anxious and does not have insomnia.       PAST MEDICAL HISTORY :  Past Medical History:  Diagnosis Date  . Anginal pain ([place]   . Arthritis   . Body mass index (BMI) of 24.0-24.9 in adult   . Cardiovascular disease   . Colostomy present ([place]   . Current every day smoker    a. ~35 years - > has cut down to < 3/4 ppd  . Headache   . History of rectal cancer   . History of UTI   . Hypertension   . Midsternal chest pain    a. 05/2014 Stress Echo: Ex time:  8:09, ECG w/o acute changes, EF nl with possible mid and apical anterior HK-->cath recommended.  . Personal history of chemotherapy 2017  . Personal history of radiation therapy 2017  . Port-A-Cath in place    RIGHT SIDE  . Rectal  cancer (Rachel [name]) 2017  . Stroke ([place])    07/2016 tia no neurology followup now seen by pcp  . Syncope and collapse   . Thyroid nodule 2018   FINDINGS CONSISTENT WITH A HURTHLE CELL LESION AND/OR NEOPLASM (BETHESDA CATEGORY IV).    PAST SURGICAL HISTORY :   Past Surgical History:  Procedure Laterality Date  . ABDOMINAL HYSTERECTOMY    . ABDOMINAL PERINEAL BOWEL RESECTION N/A 03/16/2017   Procedure: ABDOMINAL PERINEAL RESECTION;  Surgeon: Rachel Lye, MD;  Location: [facility] ORS;  Service: General;  Laterality: N/A;  . APPENDECTOMY  03/16/2017   Procedure: APPENDECTOMY;  Surgeon: Rachel Lye, MD;  Location: [facility] ORS;  Service: General;;  . BACK SURGERY     l 3,4,5  . BIOPSY THYROID Right 11/16/2016   FINDINGS CONSISTENT WITH A HURTHLE CELL LESION AND/OR NEOPLASM (BETHESDA CATEGORY IV).  [name] CHOLECYSTECTOMY    . COLONOSCOPY WITH PROPOFOL N/A 08/15/2016   Procedure: COLONOSCOPY WITH PROPOFOL;  Surgeon: Rachel Sails, MD;  Location: [facility] ENDOSCOPY;  Service: Endoscopy;  Laterality: N/A;  . COLONOSCOPY WITH PROPOFOL N/A 11/08/2016   Procedure: COLONOSCOPY WITH PROPOFOL;  Surgeon: Rachel Lye, MD;  Location: [facility] ENDOSCOPY;  Service: Endoscopy;  Laterality: N/A;  . COLOSTOMY N/A 11/23/2016   Procedure: SIGMOID COLOSTOMY;  Surgeon: Rachel Lye, MD;  Location: [facility] ORS;  Service: General;  Laterality: N/A;  . ERCP    . INCISION AND DRAINAGE PERIRECTAL ABSCESS N/A 04/15/2018   Procedure: RECTAL ABSCESS;  Surgeon: Rachel Bellow, MD;  Location: [facility] ORS;  Service: General;  Laterality: N/A;  . LAPAROSCOPY N/A 11/23/2016   Procedure: LAPAROSCOPY DIAGNOSTIC;  Surgeon: Rachel Lye, MD;  Location: [facility] ORS;  Service: General;  Laterality: N/A;  . PARTIAL HYSTERECTOMY    . PORTACATH PLACEMENT N/A 08/18/2016   Procedure: INSERTION PORT-A-CATH;  Surgeon: Rachel Lye, MD;  Location: [facility] ORS;  Service: General;  Laterality: N/A;  . THYROID LOBECTOMY  Right 11/05/2017   Procedure: THYROID LOBECTOMY;  Surgeon: Rachel Lye, MD;  Location: [facility] ORS;  Service: General;  Laterality: Right;    FAMILY HISTORY :   Family History  Problem Relation Age of Onset  . Breast cancer Mother 35  . Hyperlipidemia Father   . Heart disease Paternal Grandmother   . Stroke Maternal Grandfather   . Prostate cancer Brother     SOCIAL HISTORY:   Social History   Tobacco Use  . Smoking status: Former Smoker    Packs/day: 0.25    Years: 35.00    Pack years: 8.75    Types: Cigarettes    Quit date: 08/11/2016    Years since quitting: 3.0  . Smokeless tobacco: Never Used  Substance Use Topics  . Alcohol use: No  . Drug use: No    ALLERGIES:  is allergic to prednisone.  MEDICATIONS:  Current Outpatient Medications  Medication Sig Dispense Refill  . acetaminophen (TYLENOL) 500 MG tablet Take 500 mg by mouth every 6 (six) hours as needed.    [name] aspirin EC 81 MG tablet Take 81 mg by mouth daily.    [name] HYDROcodone-acetaminophen (NORCO/VICODIN) 5-325 MG tablet Take 1 tablet by mouth every 8 (eight) hours as needed for moderate pain. 45 tablet  0  . lidocaine-prilocaine (EMLA) cream Apply 1 application topically as needed (for port access). 30 g 3  . lisinopril (PRINIVIL,ZESTRIL) 10 MG tablet Take 10 mg by mouth daily.     . nitroGLYCERIN (NITROSTAT) 0.4 MG SL tablet Place 1 tablet (0.4 mg total) under the tongue every 5 (five) minutes as needed for chest pain. 25 tablet 6  . potassium chloride SA (K-DUR) 20 MEQ tablet 1 pill twice a day 60 tablet 3  . prochlorperazine (COMPAZINE) 10 MG tablet Take 1 tablet (10 mg total) by mouth every 6 (six) hours as needed for nausea or vomiting. 60 tablet 1  . traMADol (ULTRAM) 50 MG tablet Take 1 tablet (50 mg total) by mouth every 6 (six) hours as needed (for pain.). 30 tablet 0  . venlafaxine XR (EFFEXOR-XR) 150 MG 24 hr capsule Take 150 mg by mouth daily with breakfast.     No current facility-administered  medications for this visit.     PHYSICAL EXAMINATION: ECOG PERFORMANCE STATUS: 0 - Asymptomatic  BP 127/83 (BP Location: Left Arm, Patient Position: Sitting, Cuff Size: Normal)   Pulse 66   Temp (!) 95.9 F (35.5 C) (Tympanic)   Resp 16   Wt 154 lb (69.9 kg)   BMI 25.63 kg/m   Filed Weights   08/29/19 1047  Weight: 154 lb (69.9 kg)    Physical Exam  Constitutional: She is oriented to person, place, and time and well-developed, well-nourished, and in no distress.  She is alone.  She is walking herself.  HENT:  Head: Normocephalic and atraumatic.  Mouth/Throat: Oropharynx is clear and moist. No oropharyngeal exudate.  Eyes: Pupils are equal, round, and reactive to light.  Neck: Normal range of motion. Neck supple.  Cardiovascular: Normal rate and regular rhythm.  Pulmonary/Chest: No respiratory distress. She has no wheezes.  Abdominal: Soft. Bowel sounds are normal. She exhibits no distension and no mass. There is no abdominal tenderness. There is no rebound and no guarding.  Colostomy in place.  Musculoskeletal: Normal range of motion.        General: No tenderness or edema.  Neurological: She is alert and oriented to person, place, and time.  Skin: Skin is warm.  Psychiatric: Affect normal.    LABORATORY DATA:  I have reviewed the data as listed    Component Value Date/Time   NA 138 08/29/2019 1044   NA 140 03/08/2017 1223   NA 144 07/25/2014 0444   K 3.2 (L) 08/29/2019 1044   K 3.7 07/25/2014 0444   CL 100 08/29/2019 1044   CL 107 07/25/2014 0444   CO2 28 08/29/2019 1044   CO2 30 07/25/2014 0444   GLUCOSE 97 08/29/2019 1044   GLUCOSE 87 07/25/2014 0444   BUN 12 08/29/2019 1044   BUN 13 03/08/2017 1223   BUN 8 07/25/2014 0444   CREATININE 0.74 08/29/2019 1044   CREATININE 0.69 07/25/2014 0444   CALCIUM 9.0 08/29/2019 1044   CALCIUM 8.4 (L) 07/25/2014 0444   PROT 7.6 08/29/2019 1044   PROT 6.8 03/08/2017 1223   PROT 6.5 07/25/2014 0444   ALBUMIN 3.7  08/29/2019 1044   ALBUMIN 4.2 03/08/2017 1223   ALBUMIN 2.6 (L) 07/25/2014 0444   AST 20 08/29/2019 1044   AST 96 (H) 07/25/2014 0444   ALT 14 08/29/2019 1044   ALT 123 (H) 07/25/2014 0444   ALKPHOS 131 (H) 08/29/2019 1044   ALKPHOS 189 (H) 07/25/2014 0444   BILITOT 0.4 08/29/2019 1044   BILITOT 0.2  03/08/2017 1223   BILITOT 2.0 (H) 07/25/2014 0444   GFRNONAA >60 08/29/2019 1044   GFRNONAA >60 07/25/2014 0444   GFRAA >60 08/29/2019 1044   GFRAA >60 07/25/2014 0444    No results found for: SPEP, UPEP  Lab Results  Component Value Date   WBC 7.6 08/29/2019   NEUTROABS 5.9 08/29/2019   HGB 12.4 08/29/2019   HCT 37.9 08/29/2019   MCV 86.9 08/29/2019   PLT 206 08/29/2019      Chemistry      Component Value Date/Time   NA 138 08/29/2019 1044   NA 140 03/08/2017 1223   NA 144 07/25/2014 0444   K 3.2 (L) 08/29/2019 1044   K 3.7 07/25/2014 0444   CL 100 08/29/2019 1044   CL 107 07/25/2014 0444   CO2 28 08/29/2019 1044   CO2 30 07/25/2014 0444   BUN 12 08/29/2019 1044   BUN 13 03/08/2017 1223   BUN 8 07/25/2014 0444   CREATININE 0.74 08/29/2019 1044   CREATININE 0.69 07/25/2014 0444      Component Value Date/Time   CALCIUM 9.0 08/29/2019 1044   CALCIUM 8.4 (L) 07/25/2014 0444   ALKPHOS 131 (H) 08/29/2019 1044   ALKPHOS 189 (H) 07/25/2014 0444   AST 20 08/29/2019 1044   AST 96 (H) 07/25/2014 0444   ALT 14 08/29/2019 1044   ALT 123 (H) 07/25/2014 0444   BILITOT 0.4 08/29/2019 1044   BILITOT 0.2 03/08/2017 1223   BILITOT 2.0 (H) 07/25/2014 0444       RADIOGRAPHIC STUDIES: I have personally reviewed the radiological images as listed and agreed with the findings in the report. No results found.   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 July 14th CT- NO evidence of cancer; but pelvic fluid collection noted [see below]; STABLE.    #Continue surveillance for rectal cancer; every 3 months follow-up; abdominal pelvis imaging every 6 months or so.  #History of severe hypokalemia-unclear etiology.  Today potassium is 3.2.  Improved; continue taking K-Dur 20 mEq once a day.  #Slightly elevated CEA-4-5; unclear etiology do not suspect from malignancy.  Suspect related infection/pelvic fluid collection.  Monitor for now  # Pelvic/perineal fluid collection/abscess-[ followed by Rachel [name]].  CT scan July 14th-  elongated air and fluid collection in the posterior low pelvis measuring 2.4 x 1.6 x 7.3 cm; s/p evaluation with Dr. Cherylann Parr.  No concerns for any active infection.  Recommend follow-up with Rachel [name].  Patient agrees.  # DISPOSITION:  # follow up in 3 months-MD/cbc/cmp/CEA/port flush- RachelB    Orders Placed This Encounter  Procedures  . CBC with Differential    Standing Status:   Future    Standing Expiration Date:   08/28/2020  . Comprehensive metabolic panel    Standing Status:   Future    Standing Expiration Date:   08/28/2020  . CEA    Standing Status:   Future    Standing Expiration Date:   08/28/2020   All questions were answered. The patient knows to call the clinic with any problems, questions or concerns.      Rachel Sickle, MD 08/29/2019 12:42 PM

## 2019-08-29 NOTE — Assessment & Plan Note (Addendum)
#   Rectal  Cancer- stage III; currently under surveillance; CEA slightly elevated between 4-5. July 14th CT- NO evidence of cancer; but pelvic fluid collection noted [see below]; STABLE.   #Continue surveillance for rectal cancer; every 3 months follow-up; abdominal pelvis imaging every 6 months or so.  #History of severe hypokalemia-unclear etiology.  Today potassium is 3.2.  Improved; continue taking K-Dur 20 mEq once a day.  #Slightly elevated CEA-4-5; unclear etiology do not suspect from malignancy.  Suspect related infection/pelvic fluid collection.  Monitor for now  # Pelvic/perineal fluid collection/abscess-[ followed by Dr.Byrnett].  CT scan July 14th-  elongated air and fluid collection in the posterior low pelvis measuring 2.4 x 1.6 x 7.3 cm; s/p evaluation with Dr. Cherylann Parr.  No concerns for any active infection.  Recommend follow-up with Dr. Tollie Pizza.  Patient agrees.  # DISPOSITION:  # follow up in 3 months-MD/cbc/cmp/CEA/port flush- Dr.B

## 2019-09-01 ENCOUNTER — Telehealth: Payer: Self-pay | Admitting: *Deleted

## 2019-09-01 NOTE — Telephone Encounter (Signed)
Spoke with patient. Pt gave verbal understanding of the plan of care.

## 2019-09-01 NOTE — Telephone Encounter (Signed)
Left vm for patient to return my phone call. 

## 2019-09-01 NOTE — Telephone Encounter (Signed)
-----   Message from Cammie Sickle, MD sent at 08/29/2019  7:51 PM EDT ----- H/B- Please inform pt that her Potassium is slightly low at 3.2; recommend continue taking Kdur 20 meq/day. Ok to refill if needed.

## 2019-09-03 LAB — CEA: CEA: 5.2 ng/mL — ABNORMAL HIGH (ref 0.0–4.7)

## 2019-09-17 ENCOUNTER — Ambulatory Visit: Payer: Medicare Other | Admitting: Surgery

## 2019-09-23 ENCOUNTER — Telehealth: Payer: Self-pay | Admitting: Internal Medicine

## 2019-09-23 ENCOUNTER — Telehealth: Payer: Self-pay | Admitting: *Deleted

## 2019-09-23 NOTE — Telephone Encounter (Signed)
-----   Message from Cammie Sickle, MD sent at 09/23/2019  8:20 AM EDT ----- Rachel [name]- Please inform patient that her tumor marker continues to be slightly elevated at 5.2/but not a whole lot change over the last 1 year.  Do not suspect this from cancer/? Hx of smoking.  We will continue to follow closely. GB

## 2019-09-23 NOTE — Telephone Encounter (Signed)
Pt returned phone call. Results reviewed.

## 2019-09-23 NOTE — Telephone Encounter (Signed)
Left vm for patient -requesting a return phone call. 

## 2019-09-23 NOTE — Telephone Encounter (Signed)
With the recent note from surgery; spontaneous drainage of the perineal abscess.  Discussed with patient symptoms improved.  Also discussed regarding slightly elevated CEA; overall stable on 5.  Monitor for now no concerns for any recurrent cancer.  Follow-up as planned

## 2019-10-14 ENCOUNTER — Other Ambulatory Visit: Payer: Self-pay | Admitting: Internal Medicine

## 2019-10-14 NOTE — Telephone Encounter (Signed)
...    Ref Range & Units 86mo ago 77mo ago  Potassium 3.5 - 5.1 mmol/L 3.2Low   3.5

## 2019-11-11 ENCOUNTER — Telehealth: Payer: Self-pay | Admitting: Urology

## 2019-11-12 ENCOUNTER — Encounter: Payer: Self-pay | Admitting: Urology

## 2019-11-12 NOTE — Telephone Encounter (Signed)
Staff attempted to contact the patient yesterday to have her schedule a follow up appointment for hematuria.  She would not come to the phone.  I will be sending a certified letter to her.

## 2019-11-28 ENCOUNTER — Inpatient Hospital Stay: Payer: Medicare Other | Attending: Internal Medicine

## 2019-11-28 ENCOUNTER — Inpatient Hospital Stay: Payer: Medicare Other | Admitting: Internal Medicine

## 2019-12-24 ENCOUNTER — Encounter: Payer: Self-pay | Admitting: *Deleted

## 2020-04-20 ENCOUNTER — Other Ambulatory Visit: Payer: Self-pay | Admitting: Internal Medicine

## 2020-04-20 NOTE — Telephone Encounter (Signed)
...    Ref Range & Units 7 mo ago  Potassium 3.5 - 5.1 mmol/L 3.2Low

## 2020-06-07 NOTE — Progress Notes (Signed)
Certified Letter  

## 2020-07-14 ENCOUNTER — Other Ambulatory Visit: Payer: Self-pay | Admitting: General Surgery

## 2020-07-14 NOTE — Progress Notes (Signed)
HPI: This is a 64 year old female who is being seen for a chief complaint of rectal drainage. Patient presents to the office today with complaints of rectal drainage for 2-3 weeks. She states it started off as mucous now it is clear with an odor. The patient underwent an abdominal peroneal resection in April 2018 for a cancer of the low rectum. She underwent incision and drainage of a peroneal abscess in May 2019. CT scan at that time showed a area postoperative changes in a small fluid collection in front of the sacrum. Her most recent imaging study of July 2020 showed a persistent 1.6 x 2.4 x 7.3 cm area in the low pelvis connecting to the gluteal cleft. She had had drainage that time but hadn't stopped until recently. The patient has had no fever or chills to suggest an infectious process. Plans for a colonoscopy had been put on hold first due to the pandemic and then due to insurance issues. The patient's weight today was 170 pounds, and in July 2020 was 159 pounds. Vitals: VITALS Date Taken By B.P. Pulse Resp. O2 Sat. Temp. Ht. Wt. BMI BSA 04/15/20 14:55 Karie Fetch RN [facility]) 132/88 SIT 89 97.0% 98.1 F 66.0 in 170.0 lbs 27.4 1.9 * Patient Reported Exam: An examination was performed. Focused Appearance: well developed and nourished Judgment and Insight: Appropriate judgment, insight, interpersonal dynamics and expectations of encounter and goals of treatment Orientation: Alert and oriented to person, place, time. Mood: Mood and affect well-adjusted, pleasant and cooperative, appropriate for clinical and encounter circumstances Neck Exam: Normal neck examination without masses, tenderness or crepitus Respiratory Effort: Normal respiratory effort without labored breathing or accessory muscle use Lung Auscultation: Normal right lung examination without wheezing, rales or rhonchi, Normal left lung examination without wheezing, rales or rhonchi Heart Auscultation Exam: Normal heart  auscultation with faint murmur. Atrial fibrillation. Abdominal Survey: Normal abdominal survey without masses or tenderness Hernia Exam: Normal abdominal wall without hernias or bulges Perianal Exam: Examination of the perineum shows to pits, the interior of which on probing with the Q-tip extends about 6 cm. No loculated fluid.  We will have Dr. Rogue Bussing order CT scan chest, abdomen, and pelvis for follow up of presacral abscess and rectal cancer due to insurance. Colonoscopy to be scheduled when insurance hurdles cleared or with another physician. At the time of her last evaluation the case was discussed with Donia Ast, MD who raised the possibility of placing a mental patch in the area to help resolve the recurrent inflammatory process. Decisions and referrals will be made on the upcoming CT. Based on her clinical exam and weight gain, there's no if it is a recurrent tumor.   CT of June 24, 2019 reviewed: 3x3x8 cm area of chronic inflammatory tissue noted in the presacral space. Unchagned from 2019 study.

## 2020-08-18 ENCOUNTER — Other Ambulatory Visit: Payer: Self-pay

## 2020-08-18 ENCOUNTER — Other Ambulatory Visit
Admission: RE | Admit: 2020-08-18 | Discharge: 2020-08-18 | Disposition: A | Discharge status: Home / Self Care | Payer: Medicare Other | Source: Ambulatory Visit | Attending: General Surgery | Admitting: General Surgery

## 2020-08-18 DIAGNOSIS — Z20822 Contact with and (suspected) exposure to covid-19: Secondary | ICD-10-CM | POA: Insufficient documentation

## 2020-08-18 DIAGNOSIS — Z01812 Encounter for preprocedural laboratory examination: Secondary | ICD-10-CM | POA: Insufficient documentation

## 2020-08-18 LAB — SARS CORONAVIRUS 2 (TAT 6-24 HRS): SARS Coronavirus 2: NEGATIVE

## 2020-08-18 NOTE — H&P (Signed)
Chief Complaint: rectal drainage HPI: This is a 64 year old female who is being seen for a chief complaint of rectal drainage. Patient presents to the office today with complaints of rectal drainage for 2-3 weeks. She states it started off as mucous now it is clear with an odor. The patient underwent an abdominal peroneal resection in April 2018 for a cancer of the low rectum. She underwent incision and drainage of a peroneal abscess in May 2019. CT scan at that time showed a area postoperative changes in a small fluid collection in front of the sacrum. Her most recent imaging study of July 2020 showed a persistent 1.6 x 2.4 x 7.3 cm area in the low pelvis connecting to the gluteal cleft. She had had drainage that time but hadn't stopped until recently. The patient has had no fever or chills to suggest an infectious process. Plans for a colonoscopy had been put on hold first due to the pandemic and then due to insurance issues. The patient's weight today was 170 pounds, and in July 2020 was 159 pounds.  Medical History Arthritis Essential hypertension Malignant tumor of rectum Maternal illness: age first period 58 age first pregnancy 69 Obstetric History Gravida: 4 Para: 2 Surgical History Colonoscopy: 03-16-2017 History of cholecystectomy: 1983 Hysterectomy: 1999 Rectal: rectal ca 2018 Other: back surgery 2000; port a cath Plastic Surgery History Plastic Surgery History Back fusion: 2000 H/O: hysterectomy: 1999 Laparoscopic cholecystectomy: 1983 Malignant tumor of rectum: 2018 Portal venous system structure Social History EtOH less than 1 drink per day: occassional Smoking status - Never smoker Medications mecobalamin (vitamin B12) 10,000 mcg Injection - recon soln Frequency: qweek amlodipine 10 mg Oral - tablet Frequency: qd Klor-Con M20 20 mEq Oral - tablet,ER particles/crystals Frequency: qd tramadol 50 mg Oral - Dose: 2 tablet Frequency: tid prn venlafaxine  150 mg Oral - tablet extended release 24hr Frequency: qd Vitamin D3 Oral Frequency: qd Allergies prednisone - Other: blisters lips/mouth ROS Provider reviewed on Apr 15, 2020. A focused review of systems was performed including Constitutional / Symptom and Respiratory and was notable for cough. No Fever Or Chills. Vitals: VITALS Date Taken By B.P. Pulse Resp. O2 Sat. Temp. Ht. Wt. BMI BSA 04/15/20 14:55 Karie Fetch RN [facility]) 132/88 SIT 89 97.0% 98.1 F 66.0 in 170.0 lbs 27.4 1.9 * Patient Reported Exam: An examination was performed. Focused Appearance: well developed and nourished Judgment and Insight: Appropriate judgment, insight, interpersonal dynamics and expectations of encounter and goals of treatment Orientation: Alert and oriented to person, place, time. Mood: Mood and affect well-adjusted, pleasant and cooperative, appropriate for clinical and encounter circumstances Neck Exam: Normal neck examination without masses, tenderness or crepitus Respiratory Effort: Normal respiratory effort without labored breathing or accessory muscle use Lung Auscultation: Normal right lung examination without wheezing, rales or rhonchi, Normal left lung examination without wheezing, rales or rhonchi Heart Auscultation Exam: Normal heart auscultation with faint murmur. Atrial fibrillation. Abdominal Survey: Normal abdominal survey without masses or tenderness Hernia Exam: Normal abdominal wall without hernias or bulges Perianal Exam: Examination of the perineum shows to pits, the interior of which on probing with the Q-tip extends about 6 cm. No loculated fluid. Impression/Plan: Visit Note - Apr 15, 2020 Rachel [name], Rachel [name] MRN: [id number] Phone: [phone] DOB: 02-Aug-1956 Sex: Female PMS ID: [id number] Robert Bellow, MD (Primary Provider) [facility] Under) Page [phone] Work Financial [employer] [facility] [address] [place] [address] [place], [place]  [id number] We will have Dr. Rogue Bussing order  CT scan chest, abdomen, and pelvis for follow up of presacral abscess and rectal cancer due to insurance. Colonoscopy to be scheduled when insurance hurdles cleared or with another physician. At the time of her last evaluation the case was discussed with Rachel Ast, MD who raised the possibility of placing a mental patch in the area to help resolve the recurrent inflammatory process. Decisions and referrals will be made on the upcoming CT. Based on her clinical exam and weight gain, there's no if it is a recurrent tumor. Copy Rachel Jude, MD; Rachel Dalton, MD Other specified symptoms and signs involving the digestive system and abdomen (R19.8) 1. Malignant neoplasm of rectum (C20) 2. Follow up. Other Instructions: Patient to call the office if she has any further questions or concerns. Staff: Robert Bellow, MD (Primary Provider) [facility] Under) Electronically Signed By: Robert Bellow, MD, 04/19/2020 07:23 PM EDT

## 2020-08-19 ENCOUNTER — Encounter: Payer: Self-pay | Admitting: General Surgery

## 2020-08-20 ENCOUNTER — Ambulatory Visit: Payer: Medicare Other | Admitting: Anesthesiology

## 2020-08-20 ENCOUNTER — Encounter
Admission: RE | Disposition: A | Discharge status: Home / Self Care | Payer: Self-pay | Source: Home / Self Care | Attending: General Surgery

## 2020-08-20 ENCOUNTER — Ambulatory Visit
Admission: RE | Admit: 2020-08-20 | Discharge: 2020-08-20 | Disposition: A | Discharge status: Home / Self Care | Payer: Medicare Other | Attending: General Surgery | Admitting: General Surgery

## 2020-08-20 DIAGNOSIS — Z9221 Personal history of antineoplastic chemotherapy: Secondary | ICD-10-CM | POA: Diagnosis not present

## 2020-08-20 DIAGNOSIS — Z8673 Personal history of transient ischemic attack (TIA), and cerebral infarction without residual deficits: Secondary | ICD-10-CM | POA: Diagnosis not present

## 2020-08-20 DIAGNOSIS — Z79899 Other long term (current) drug therapy: Secondary | ICD-10-CM | POA: Insufficient documentation

## 2020-08-20 DIAGNOSIS — Z7982 Long term (current) use of aspirin: Secondary | ICD-10-CM | POA: Diagnosis not present

## 2020-08-20 DIAGNOSIS — F17210 Nicotine dependence, cigarettes, uncomplicated: Secondary | ICD-10-CM | POA: Insufficient documentation

## 2020-08-20 DIAGNOSIS — I10 Essential (primary) hypertension: Secondary | ICD-10-CM | POA: Insufficient documentation

## 2020-08-20 DIAGNOSIS — K6289 Other specified diseases of anus and rectum: Secondary | ICD-10-CM | POA: Diagnosis not present

## 2020-08-20 DIAGNOSIS — Z888 Allergy status to other drugs, medicaments and biological substances status: Secondary | ICD-10-CM | POA: Insufficient documentation

## 2020-08-20 DIAGNOSIS — Q438 Other specified congenital malformations of intestine: Secondary | ICD-10-CM | POA: Diagnosis not present

## 2020-08-20 DIAGNOSIS — Z923 Personal history of irradiation: Secondary | ICD-10-CM | POA: Insufficient documentation

## 2020-08-20 DIAGNOSIS — M1990 Unspecified osteoarthritis, unspecified site: Secondary | ICD-10-CM | POA: Insufficient documentation

## 2020-08-20 DIAGNOSIS — Z933 Colostomy status: Secondary | ICD-10-CM | POA: Insufficient documentation

## 2020-08-20 DIAGNOSIS — Z85048 Personal history of other malignant neoplasm of rectum, rectosigmoid junction, and anus: Secondary | ICD-10-CM | POA: Insufficient documentation

## 2020-08-20 HISTORY — PX: COLONOSCOPY WITH PROPOFOL: SHX5780

## 2020-08-20 SURGERY — COLONOSCOPY WITH PROPOFOL
Anesthesia: General

## 2020-08-20 MED ORDER — PROPOFOL 500 MG/50ML IV EMUL
INTRAVENOUS | Status: DC | PRN
  Administered 2020-08-20: 100 ug/kg/min via INTRAVENOUS

## 2020-08-20 MED ORDER — SODIUM CHLORIDE 0.9 % IV SOLN
INTRAVENOUS | Status: DC
  Administered 2020-08-20: 20 mL/h via INTRAVENOUS

## 2020-08-20 MED ORDER — PROPOFOL 500 MG/50ML IV EMUL
INTRAVENOUS | Status: AC
  Filled 2020-08-20: qty 50

## 2020-08-20 MED ORDER — PROPOFOL 10 MG/ML IV BOLUS
INTRAVENOUS | Status: DC | PRN
  Administered 2020-08-20: 40 mg via INTRAVENOUS
  Administered 2020-08-20: 50 mg via INTRAVENOUS

## 2020-08-20 MED ORDER — LIDOCAINE HCL (CARDIAC) PF 100 MG/5ML IV SOSY
PREFILLED_SYRINGE | INTRAVENOUS | Status: DC | PRN
  Administered 2020-08-20: 50 mg via INTRAVENOUS

## 2020-08-20 NOTE — Anesthesia Preprocedure Evaluation (Signed)
Anesthesia Evaluation  Patient identified by MRN, date of birth, ID band Patient awake    Reviewed: Allergy & Precautions, NPO status , Patient's Chart, lab work & pertinent test results  History of Anesthesia Complications Negative for: history of anesthetic complications  Airway Mallampati: II  TM Distance: >3 FB Neck ROM: Full    Dental  (+) Upper Dentures, Lower Dentures   Pulmonary neg sleep apnea, neg COPD, Current Smoker and Patient abstained from smoking.,    breath sounds clear to auscultation- rhonchi (-) wheezing      Cardiovascular Exercise Tolerance: Good hypertension, Pt. on medications (-) angina(-) CAD, (-) Past MI and (-) Cardiac Stents  Rhythm:Regular Rate:Normal - Systolic murmurs and - Diastolic murmurs Stress test 08/16/16: Stress test result No ischemia, normal wall motion Overall a normal study EF 70%  Echo 07/17/16: - Left ventricle: The cavity size was normal. Wall thickness was  increased in a pattern of mild LVH. Systolic function was normal.  The estimated ejection fraction was in the range of 50% to 55%.  Wall motion was normal; there were no regional wall motion  abnormalities. Doppler parameters are consistent with abnormal  left ventricular relaxation (grade 1 diastolic dysfunction).  Doppler parameters are consistent with high ventricular filling  pressure.  - Aortic valve: There was trivial regurgitation.    Neuro/Psych  Headaches, neg Seizures CVA, No Residual Symptoms negative psych ROS   GI/Hepatic negative GI ROS, Neg liver ROS,   Endo/Other  negative endocrine ROSneg diabetes  Renal/GU negative Renal ROS     Musculoskeletal  (+) Arthritis ,   Abdominal (+) - obese,   Peds  Hematology negative hematology ROS (+)   Anesthesia Other Findings Past Medical History: No date: Anginal pain (HCC) No date: Arthritis No date: Body mass index (BMI) of 24.0-24.9 in  adult No date: Cardiovascular disease No date: Colostomy present (HCC) No date: Current every day smoker     Comment:  a. ~35 years - > has cut down to < 3/4 ppd No date: Headache No date: History of rectal cancer No date: History of UTI No date: Hypertension No date: Midsternal chest pain     Comment:  a. 05/2014 Stress Echo: Ex time: 8:09, ECG w/o acute               changes, EF nl with possible mid and apical anterior               HK-->cath recommended. 2017: Personal history of chemotherapy 2017: Personal history of radiation therapy No date: Port-A-Cath in place     Comment:  RIGHT SIDE 2017: Rectal cancer ([place]) No date: Stroke [facility])     Comment:  07/2016 tia no neurology followup now seen by pcp No date: Syncope and collapse 2018: Thyroid nodule     Comment:  FINDINGS CONSISTENT WITH A HURTHLE CELL LESION AND/OR               NEOPLASM (BETHESDA CATEGORY IV).   Reproductive/Obstetrics                             Anesthesia Physical Anesthesia Plan  ASA: III  Anesthesia Plan: General   Post-op Pain Management:    Induction: Intravenous  PONV Risk Score and Plan: 1 and Propofol infusion  Airway Management Planned: Natural Airway  Additional Equipment:   Intra-op Plan:   Post-operative Plan:   Informed Consent: I have reviewed the patients History  and Physical, chart, labs and discussed the procedure including the risks, benefits and alternatives for the proposed anesthesia with the patient or authorized representative who has indicated his/her understanding and acceptance.     Dental advisory given  Plan Discussed with: CRNA and Anesthesiologist  Anesthesia Plan Comments:         Anesthesia Quick Evaluation

## 2020-08-20 NOTE — Op Note (Signed)
[facility] Gastroenterology Patient Name: Rachel [name] Procedure Date: 08/20/2020 7:39 AM MRN: [id number] Account #: [id number] Date of Birth: [birth year] Admit Type: Outpatient Age: 64 Room: [facility] ENDO ROOM 1 Gender: Female Note Status: Finalized Procedure:             Colonoscopy Indications:           High risk colon cancer surveillance: Personal history                         of colon cancer Providers:             Robert Bellow, MD Referring MD:          Lynnell Jude (Referring MD) Medicines:             Monitored Anesthesia Care Complications:         No immediate complications. Procedure:             Pre-Anesthesia Assessment:                        - Prior to the procedure, a History and Physical was                         performed, and patient medications, allergies and                         sensitivities were reviewed. The patient's tolerance                         of previous anesthesia was reviewed.                        - The risks and benefits of the procedure and the                         sedation options and risks were discussed with the                         patient. All questions were answered and informed                         consent was obtained.                        After obtaining informed consent, the colonoscope was                         passed under direct vision. Throughout the procedure,                         the patient's blood pressure, pulse, and oxygen                         saturations were monitored continuously. The                         Colonoscope was introduced through the sigmoid                         colostomy and advanced to the the cecum,  identified by                         appendiceal orifice and ileocecal valve. The                         colonoscopy was somewhat difficult due to significant                         looping. The patient tolerated the procedure well. The                          quality of the bowel preparation was excellent. Findings:      The retroflexed view of the distal rectum and anal verge was normal and       showed no anal or rectal abnormalities. Impression:            - The distal rectum and anal verge are normal on                         retroflexion view.                        - No specimens collected. Recommendation:        - Repeat colonoscopy in 3 years for surveillance. Procedure Code(s):     --- Professional ---                        [phone], Colonoscopy through stoma; diagnostic,                         including collection of specimen(s) by brushing or                         washing, when performed (separate procedure) Diagnosis Code(s):     --- Professional ---                        S97.530, Personal history of other malignant neoplasm                         of large intestine CPT copyright 2019 American Medical Association. All rights reserved. The codes documented in this report are preliminary and upon coder review may  be revised to meet current compliance requirements. Robert Bellow, MD 08/20/2020 8:13:55 AM This report has been signed electronically. Number of Addenda: 0 Note Initiated On: 08/20/2020 7:39 AM Scope Withdrawal Time: 0 hours 9 minutes 8 seconds  Total Procedure Duration: 0 hours 19 minutes 37 seconds  Estimated Blood Loss:  Estimated blood loss: none.      [facility]

## 2020-08-20 NOTE — Anesthesia Postprocedure Evaluation (Signed)
Anesthesia Post Note  Patient: Rachel [name]  Procedure(s) Performed: COLONOSCOPY WITH PROPOFOL (N/A )  Patient location during evaluation: Endoscopy Anesthesia Type: General Level of consciousness: awake and alert and oriented Pain management: pain level controlled Vital Signs Assessment: post-procedure vital signs reviewed and stable Respiratory status: spontaneous breathing, nonlabored ventilation and respiratory function stable Cardiovascular status: blood pressure returned to baseline and stable Postop Assessment: no signs of nausea or vomiting Anesthetic complications: no   No complications documented.   Last Vitals:  Vitals:   08/20/20 0833 08/20/20 0843  BP: 120/61 130/65  Pulse: (!) 53 (!) 57  Resp: 18 20  Temp:    SpO2: 98% 98%    Last Pain:  Vitals:   08/20/20 0843  TempSrc:   PainSc: 0-No pain                 Makhi Muzquiz

## 2020-08-20 NOTE — H&P (Signed)
Rachel [name] [id number] [birth year]     HPI:  Prior APR for rectal cancer. For follow up exam.  Nausea and vomiting with the first half of the prep. Better w/ po Zofran and slower ingestion rate with second half.  Stool in stoma "root beer" color, consistency of milk.  With irrigation during the procedure, I anticipate a successful procedure.   Medications Prior to Admission  Medication Sig Dispense Refill Last Dose   acetaminophen (TYLENOL) 500 MG tablet Take 500 mg by mouth every 6 (six) hours as needed.   Past Week at Unknown time   amLODipine (NORVASC) 10 MG tablet Take 10 mg by mouth daily.   Past Week at Unknown time   aspirin EC 81 MG tablet Take 81 mg by mouth daily.   Past Week at Unknown time   Cholecalciferol (VITAMIN D3 PO) Take by mouth daily.   Past Week at Unknown time   Cyanocobalamin (B-12 PO) Inject 10,000 mcg as directed once a week.   Past Week at Unknown time   HYDROcodone-acetaminophen (NORCO/VICODIN) 5-325 MG tablet Take 1 tablet by mouth every 8 (eight) hours as needed for moderate pain. 45 tablet 0 Past Week at Unknown time   lidocaine-prilocaine (EMLA) cream Apply 1 application topically as needed (for port access). 30 g 3 Past Week at Unknown time   lisinopril (PRINIVIL,ZESTRIL) 10 MG tablet Take 10 mg by mouth daily.    Past Week at Unknown time   nitroGLYCERIN (NITROSTAT) 0.4 MG SL tablet Place 1 tablet (0.4 mg total) under the tongue every 5 (five) minutes as needed for chest pain. 25 tablet 6 Past Week at Unknown time   potassium chloride SA (KLOR-CON M20) 20 MEQ tablet TAKE 1 TABLET BY MOUTH TWICE A DAY 180 tablet 1 Past Week at Unknown time   prochlorperazine (COMPAZINE) 10 MG tablet Take 1 tablet (10 mg total) by mouth every 6 (six) hours as needed for nausea or vomiting. 60 tablet 1 Past Week at Unknown time   traMADol (ULTRAM) 50 MG tablet Take 1 tablet (50 mg total) by mouth every 6 (six) hours as needed (for pain.). 30 tablet 0 Past Week at  Unknown time   venlafaxine XR (EFFEXOR-XR) 150 MG 24 hr capsule Take 150 mg by mouth daily with breakfast.   Past Week at Unknown time   Allergies  Allergen Reactions   Prednisone Other (See Comments)    BLISTERS IN MOUTH AND ON TONGUE   Past Medical History:  Diagnosis Date   Anginal pain (HCC)    Arthritis    Body mass index (BMI) of 24.0-24.9 in adult    Cardiovascular disease    Colostomy present ([place])    Current every day smoker    a. ~35 years - > has cut down to < 3/4 ppd   Headache    History of rectal cancer    History of UTI    Hypertension    Midsternal chest pain    a. 05/2014 Stress Echo: Ex time: 8:09, ECG w/o acute changes, EF nl with possible mid and apical anterior HK-->cath recommended.   Personal history of chemotherapy 2017   Personal history of radiation therapy 2017   Port-A-Cath in place    RIGHT SIDE   Rectal cancer ([place]) 2017   Stroke ([place])    07/2016 tia no neurology followup now seen by pcp   Syncope and collapse    Thyroid nodule 2018   FINDINGS CONSISTENT WITH A HURTHLE CELL LESION AND/OR  NEOPLASM (BETHESDA CATEGORY IV).   Past Surgical History:  Procedure Laterality Date   ABDOMINAL HYSTERECTOMY  1999   ABDOMINAL PERINEAL BOWEL RESECTION N/A 03/16/2017   Procedure: ABDOMINAL PERINEAL RESECTION;  Surgeon: Christene Lye, MD;  Location: [facility] ORS;  Service: General;  Laterality: N/A;   APPENDECTOMY  03/16/2017   Procedure: APPENDECTOMY;  Surgeon: Christene Lye, MD;  Location: [facility] ORS;  Service: General;;   BACK SURGERY  2000   l 3,4,5   BIOPSY THYROID Right 11/16/2016   FINDINGS CONSISTENT WITH A HURTHLE CELL LESION AND/OR NEOPLASM (BETHESDA CATEGORY IV).   CHOLECYSTECTOMY  1983   COLONOSCOPY WITH PROPOFOL N/A 08/15/2016   Procedure: COLONOSCOPY WITH PROPOFOL;  Surgeon: Lollie Sails, MD;  Location: [facility] ENDOSCOPY;  Service: Endoscopy;  Laterality: N/A;   COLONOSCOPY WITH PROPOFOL N/A 11/08/2016    Procedure: COLONOSCOPY WITH PROPOFOL;  Surgeon: Christene Lye, MD;  Location: [facility] ENDOSCOPY;  Service: Endoscopy;  Laterality: N/A;   COLOSTOMY N/A 11/23/2016   Procedure: SIGMOID COLOSTOMY;  Surgeon: Christene Lye, MD;  Location: [facility] ORS;  Service: General;  Laterality: N/A;   ERCP     INCISION AND DRAINAGE PERIRECTAL ABSCESS N/A 04/15/2018   Procedure: RECTAL ABSCESS;  Surgeon: Robert Bellow, MD;  Location: [facility] ORS;  Service: General;  Laterality: N/A;   LAPAROSCOPY N/A 11/23/2016   Procedure: LAPAROSCOPY DIAGNOSTIC;  Surgeon: Christene Lye, MD;  Location: [facility] ORS;  Service: General;  Laterality: N/A;   PARTIAL HYSTERECTOMY     PORTACATH PLACEMENT N/A 08/18/2016   Procedure: INSERTION PORT-A-CATH;  Surgeon: Christene Lye, MD;  Location: [facility] ORS;  Service: General;  Laterality: N/A;   THYROID LOBECTOMY Right 11/05/2017   Procedure: THYROID LOBECTOMY;  Surgeon: Christene Lye, MD;  Location: [facility] ORS;  Service: General;  Laterality: Right;   Social History   Socioeconomic History   Marital status: Legally Separated    Spouse name: Not on file   Number of children: Not on file   Years of education: Not on file   Highest education level: Not on file  Occupational History   Not on file  Tobacco Use   Smoking status: Former Smoker    Packs/day: 0.25    Years: 35.00    Pack years: 8.75    Types: Cigarettes    Quit date: 08/11/2016    Years since quitting: 4.0   Smokeless tobacco: Never Used  Vaping Use   Vaping Use: Never used  Substance and Sexual Activity   Alcohol use: No   Drug use: No   Sexual activity: Not Currently  Other Topics Concern   Not on file  Social History Narrative   Married.   Works for [employer].   Smokes ~3/4 ppd   Social Determinants of Radio broadcast assistant Strain:    Difficulty of Paying Living Expenses: Not on file  Food Insecurity:    Worried About Charity fundraiser  in the Last Year: Not on file   [employer] of Food in the Last Year: Not on file  Transportation Needs:    Lack of Transportation (Medical): Not on file   Lack of Transportation (Non-Medical): Not on file  Physical Activity:    Days of Exercise per Week: Not on file   Minutes of Exercise per Session: Not on file  Stress:    Feeling of Stress : Not on file  Social Connections:    Frequency of Communication with Friends and Family: Not  on file   Frequency of Social Gatherings with Friends and Family: Not on file   Attends Religious Services: Not on file   Active Member of Clubs or Organizations: Not on file   Attends Archivist Meetings: Not on file   Marital Status: Not on file  Intimate Partner Violence:    Fear of Current or Ex-Partner: Not on file   Emotionally Abused: Not on file   Physically Abused: Not on file   Sexually Abused: Not on file   Social History   Social History Narrative   Married.   Works for [employer].   Smokes ~3/4 ppd     ROS: Negative.     PE: HEENT: Negative. Lungs: Clear. Cardio: RR.   Assessment/Plan:  Proceed with planned endoscopy.   [facility] 08/20/2020

## 2020-08-20 NOTE — Transfer of Care (Signed)
Immediate Anesthesia Transfer of Care Note  Patient: Rachel [name]  Procedure(s) Performed: COLONOSCOPY WITH PROPOFOL (N/A )  Patient Location: Endoscopy Unit  Anesthesia Type:General  Level of Consciousness: sedated  Airway & Oxygen Therapy: Patient Spontanous Breathing  Post-op Assessment: Report given to RN and Post -op Vital signs reviewed and stable  Post vital signs: Reviewed and stable  Last Vitals:  Vitals Value Taken Time  BP 111/69 08/20/20 0813  Temp    Pulse 60 08/20/20 0813  Resp 15 08/20/20 0813  SpO2 100 % 08/20/20 0813  Vitals shown include unvalidated device data.  Last Pain:  Vitals:   08/20/20 0656  TempSrc: Temporal  PainSc: 0-No pain         Complications: No complications documented.

## 2020-08-21 NOTE — Progress Notes (Signed)
Non-identified Voicemail.  No message left. 

## 2020-08-23 ENCOUNTER — Encounter: Payer: Self-pay | Admitting: General Surgery

## 2020-10-18 ENCOUNTER — Other Ambulatory Visit: Payer: Self-pay | Admitting: Internal Medicine

## 2021-03-22 ENCOUNTER — Other Ambulatory Visit: Payer: Self-pay | Admitting: General Surgery

## 2021-03-22 DIAGNOSIS — Z1231 Encounter for screening mammogram for malignant neoplasm of breast: Secondary | ICD-10-CM

## 2022-05-15 ENCOUNTER — Other Ambulatory Visit: Payer: Self-pay | Admitting: Family Medicine

## 2022-05-15 DIAGNOSIS — Z1231 Encounter for screening mammogram for malignant neoplasm of breast: Secondary | ICD-10-CM

## 2022-06-08 ENCOUNTER — Ambulatory Visit
Admission: RE | Admit: 2022-06-08 | Discharge: 2022-06-08 | Disposition: A | Discharge status: Home / Self Care | Payer: Medicare PPO | Source: Ambulatory Visit | Attending: Family Medicine | Admitting: Family Medicine

## 2022-06-08 DIAGNOSIS — Z1231 Encounter for screening mammogram for malignant neoplasm of breast: Secondary | ICD-10-CM | POA: Insufficient documentation

## 2022-06-12 ENCOUNTER — Other Ambulatory Visit: Payer: Self-pay | Admitting: Family Medicine

## 2022-06-14 ENCOUNTER — Other Ambulatory Visit: Payer: Self-pay | Admitting: Family Medicine

## 2022-06-14 DIAGNOSIS — R928 Other abnormal and inconclusive findings on diagnostic imaging of breast: Secondary | ICD-10-CM

## 2022-06-14 DIAGNOSIS — N63 Unspecified lump in unspecified breast: Secondary | ICD-10-CM

## 2022-07-05 ENCOUNTER — Ambulatory Visit
Admission: RE | Admit: 2022-07-05 | Discharge: 2022-07-05 | Disposition: A | Discharge status: Home / Self Care | Payer: Medicare PPO | Source: Ambulatory Visit | Attending: Family Medicine | Admitting: Family Medicine

## 2022-07-05 DIAGNOSIS — N63 Unspecified lump in unspecified breast: Secondary | ICD-10-CM | POA: Diagnosis present

## 2022-07-05 DIAGNOSIS — R928 Other abnormal and inconclusive findings on diagnostic imaging of breast: Secondary | ICD-10-CM | POA: Diagnosis present

## 2023-02-07 ENCOUNTER — Other Ambulatory Visit: Payer: Self-pay | Admitting: Family Medicine

## 2023-02-07 DIAGNOSIS — N63 Unspecified lump in unspecified breast: Secondary | ICD-10-CM

## 2023-02-07 DIAGNOSIS — R928 Other abnormal and inconclusive findings on diagnostic imaging of breast: Secondary | ICD-10-CM

## 2023-02-07 DIAGNOSIS — Z780 Asymptomatic menopausal state: Secondary | ICD-10-CM

## 2023-02-28 ENCOUNTER — Other Ambulatory Visit: Payer: Self-pay | Admitting: Sports Medicine

## 2023-02-28 DIAGNOSIS — M51369 Other intervertebral disc degeneration, lumbar region without mention of lumbar back pain or lower extremity pain: Secondary | ICD-10-CM

## 2023-02-28 DIAGNOSIS — M5136 Other intervertebral disc degeneration, lumbar region: Secondary | ICD-10-CM

## 2023-02-28 DIAGNOSIS — G8929 Other chronic pain: Secondary | ICD-10-CM

## 2023-02-28 DIAGNOSIS — M47816 Spondylosis without myelopathy or radiculopathy, lumbar region: Secondary | ICD-10-CM

## 2023-03-03 ENCOUNTER — Ambulatory Visit
Admission: RE | Admit: 2023-03-03 | Discharge: 2023-03-03 | Disposition: A | Discharge status: Home / Self Care | Payer: Medicare PPO | Source: Ambulatory Visit | Attending: Sports Medicine | Admitting: Sports Medicine

## 2023-03-03 DIAGNOSIS — M47816 Spondylosis without myelopathy or radiculopathy, lumbar region: Secondary | ICD-10-CM | POA: Diagnosis present

## 2023-03-03 DIAGNOSIS — M5136 Other intervertebral disc degeneration, lumbar region: Secondary | ICD-10-CM | POA: Diagnosis present

## 2023-03-03 DIAGNOSIS — G8929 Other chronic pain: Secondary | ICD-10-CM | POA: Diagnosis present

## 2023-03-03 DIAGNOSIS — M5441 Lumbago with sciatica, right side: Secondary | ICD-10-CM | POA: Insufficient documentation

## 2023-03-21 ENCOUNTER — Ambulatory Visit
Admission: RE | Admit: 2023-03-21 | Discharge: 2023-03-21 | Disposition: A | Discharge status: Home / Self Care | Payer: Medicare PPO | Attending: Surgery | Admitting: Surgery

## 2023-03-21 ENCOUNTER — Encounter
Admission: RE | Disposition: A | Discharge status: Home / Self Care | Payer: Self-pay | Source: Home / Self Care | Attending: Surgery

## 2023-03-21 ENCOUNTER — Ambulatory Visit: Payer: Medicare PPO | Admitting: Registered Nurse

## 2023-03-21 ENCOUNTER — Encounter: Payer: Self-pay | Admitting: Surgery

## 2023-03-21 DIAGNOSIS — D120 Benign neoplasm of cecum: Secondary | ICD-10-CM | POA: Insufficient documentation

## 2023-03-21 DIAGNOSIS — Z85048 Personal history of other malignant neoplasm of rectum, rectosigmoid junction, and anus: Secondary | ICD-10-CM | POA: Insufficient documentation

## 2023-03-21 DIAGNOSIS — D123 Benign neoplasm of transverse colon: Secondary | ICD-10-CM | POA: Diagnosis not present

## 2023-03-21 DIAGNOSIS — Z1211 Encounter for screening for malignant neoplasm of colon: Secondary | ICD-10-CM | POA: Insufficient documentation

## 2023-03-21 HISTORY — PX: COLONOSCOPY WITH PROPOFOL: SHX5780

## 2023-03-21 SURGERY — COLONOSCOPY WITH PROPOFOL
Anesthesia: General

## 2023-03-21 MED ORDER — SODIUM CHLORIDE 0.9 % IV SOLN
INTRAVENOUS | Status: DC
  Administered 2023-03-21: 1000 mL via INTRAVENOUS

## 2023-03-21 MED ORDER — PROPOFOL 500 MG/50ML IV EMUL
INTRAVENOUS | Status: DC | PRN
  Administered 2023-03-21: 150 ug/kg/min via INTRAVENOUS

## 2023-03-21 MED ORDER — LIDOCAINE HCL (CARDIAC) PF 100 MG/5ML IV SOSY
PREFILLED_SYRINGE | INTRAVENOUS | Status: DC | PRN
  Administered 2023-03-21: 40 mg via INTRAVENOUS

## 2023-03-21 MED ORDER — PROPOFOL 10 MG/ML IV BOLUS
INTRAVENOUS | Status: DC | PRN
  Administered 2023-03-21: 90 mg via INTRAVENOUS
  Administered 2023-03-21: 10 mg via INTRAVENOUS

## 2023-03-21 NOTE — Interval H&P Note (Signed)
History and Physical Interval Note:  03/21/2023 8:16 AM  Rachel [name]  has presented today for surgery, with the diagnosis of history of rectal cancer Z85.048.  The various methods of treatment have been discussed with the patient and family. After consideration of risks, benefits and other options for treatment, the patient has consented to  Procedure(s): COLONOSCOPY WITH PROPOFOL (N/A) as a surgical intervention.  The patient's history has been reviewed, patient examined, no change in status, stable for surgery.  I have reviewed the patient's chart and labs.  Questions were answered to the patient's satisfaction.     Brylie Sneath Tonna Boehringer

## 2023-03-21 NOTE — H&P (Signed)
Subjective:   CC: History of rectal cancer [Z85.048] POSTOP  HPI: Rachel [name] is a 67 y.o. female who is here for followup from above. No issues.   Current Medications: has a current medication list which includes the following prescription(s): amlodipine, atorvastatin, solifenacin, trazodone, and venlafaxine.  Allergies:  Allergies  Allergen Reactions  Prednisone Other (See Comments)  "blisters in mouth and on tongue"   ROS: General: Denies weight loss, weight gain, fatigue, fevers, chills, and night sweats. Heart: Denies chest pain, palpitations, racing heart, irregular heartbeat, leg pain or swelling, and decreased activity tolerance. Respiratory: Denies breathing difficulty, shortness of breath, wheezing, cough, and sputum. GI: Denies change in appetite, heartburn, nausea, vomiting, constipation, diarrhea, and blood in stool. GU: Denies difficulty urinating, pain with urinating, urgency, frequency, blood in urine   Objective:    BP 138/84  Pulse 92  Ht 167.6 cm (5\' 6" )  Wt 76.7 kg (169 lb)  BMI 27.28 kg/m   Constitutional : Alert, no distress, cooperative  Gastrointestinal: soft, non-tender; bowel sounds normal; no masses, no organomegaly.  Musculoskeletal: Steady gait and movement  Skin: Cool and moist, ostomy intact  Psychiatric: Normal affect, non-agitated, not confused    LABS:  N/A   RADS: N/A  Assessment:    History of rectal cancer [Z85.048] S/p APR in 2018 Due for colonoscopy, will schedule  Mammogram scheduled for 4/24 for f/u of BIRADS-3 Plan:    R/b/a discussed. Risks include bleeding, perforation. Benefits include diagnostic, curative procedure if needed. Alternatives include continued observation. Pt verbalized understanding.  labs/images/medications/previous chart entries reviewed personally and relevant changes/updates noted above.

## 2023-03-21 NOTE — Transfer of Care (Signed)
Immediate Anesthesia Transfer of Care Note  Patient: Rachel [name]  Procedure(s) Performed: Procedure(s): COLONOSCOPY WITH PROPOFOL (N/A)  Patient Location: PACU and Endoscopy Unit  Anesthesia Type:General  Level of Consciousness: sedated  Airway & Oxygen Therapy: Patient Spontanous Breathing and Patient connected to nasal cannula oxygen  Post-op Assessment: Report given to RN and Post -op Vital signs reviewed and stable  Post vital signs: Reviewed and stable  Last Vitals:  Vitals:   03/21/23 0807 03/21/23 0915  BP: 121/83 117/69  Pulse: 71 74  Resp: 18 (!) 23  Temp: (!) 35.8 C (!) 35.7 C  SpO2: 95% 96%    Complications: No apparent anesthesia complications

## 2023-03-21 NOTE — Op Note (Signed)
[facility] Gastroenterology Patient Name: Rachel [name] Procedure Date: 03/21/2023 8:13 AM MRN: [id number] Account #: [id number] Date of Birth: 19-Nov-1956 Admit Type: Outpatient Age: 67 Room: [facility] ENDO ROOM 1 Gender: Female Note Status: Finalized Instrument Name: Prentice Docker [id number] Procedure:             Colonoscopy Indications:           High risk colon cancer surveillance: Personal history                         of colon cancer Providers:             Sung Amabile MD, MD Referring MD:          Dortha Kern (Referring MD) Medicines:             Propofol per Anesthesia Complications:         No immediate complications. Procedure:             Pre-Anesthesia Assessment:                        - After reviewing the risks and benefits, the patient                         was deemed in satisfactory condition to undergo the                         procedure in an ambulatory setting.                        - After reviewing the risks and benefits, the patient                         was deemed in satisfactory condition to undergo the                         procedure in an ambulatory setting.                        After obtaining informed consent, the colonoscope was                         passed under direct vision. Throughout the procedure,                         the patient's blood pressure, pulse, and oxygen                         saturations were monitored continuously. The                         Colonoscope was introduced through the descending                         colostomy and advanced to the the cecum, identified by                         the ileocecal valve. The colonoscopy was technically  difficult and complex due to a tortuous colon. The                         patient tolerated the procedure well. The quality of                         the bowel preparation was good. Findings:      Two semi-pedunculated polyps were found  in the proximal transverse colon       and mid ascending colon. The polyps were 3 to 6 mm in size. These polyps       were removed with a cold snare. Resection and retrieval were complete.       Estimated blood loss was minimal. Impression:            - Two 3 to 6 mm polyps in the proximal transverse                         colon and in the mid ascending colon, removed with a                         cold snare. Resected and retrieved. Recommendation:        - Await pathology results.                        - Written discharge instructions were provided to the                         patient.                        - Discharge patient to home.                        - Resume previous diet. Procedure Code(s):     --- Professional ---                        [id number], Colonoscopy, flexible; with removal of                         tumor(s), polyp(s), or other lesion(s) by snare                         technique Diagnosis Code(s):     --- Professional ---                        U04.540Z85.038, Personal history of other malignant neoplasm                         of large intestine                        D12.3, Benign neoplasm of transverse colon (hepatic                         flexure or splenic flexure)                        D12.2, Benign neoplasm of ascending colon CPT copyright 2022 American Medical Association. All rights  reserved. The codes documented in this report are preliminary and upon coder review may  be revised to meet current compliance requirements. Dr. Harrie Foreman, MD Sung Amabile MD, MD 03/21/2023 9:14:40 AM This report has been signed electronically. Number of Addenda: 0 Note Initiated On: 03/21/2023 8:13 AM Scope Withdrawal Time: 0 hours 24 minutes 39 seconds  Total Procedure Duration: 0 hours 34 minutes 13 seconds  Estimated Blood Loss:  Estimated blood loss was minimal.      [facility]

## 2023-03-21 NOTE — Anesthesia Preprocedure Evaluation (Addendum)
Anesthesia Evaluation  Patient identified by MRN, date of birth, ID band Patient awake    Reviewed: Allergy & Precautions, NPO status , Patient's Chart, lab work & pertinent test results  History of Anesthesia Complications Negative for: history of anesthetic complications  Airway Mallampati: II  TM Distance: >3 FB Neck ROM: Full    Dental  (+) Upper Dentures, Lower Dentures, Dental Advidsory Given   Pulmonary neg sleep apnea, neg COPD, Patient abstained from smoking., former smoker   breath sounds clear to auscultation- rhonchi (-) wheezing      Cardiovascular Exercise Tolerance: Good hypertension, Pt. on medications (-) angina +CHF  (-) CAD, (-) Past MI and (-) Cardiac Stents  Rhythm:Regular Rate:Normal - Systolic murmurs and - Diastolic murmurs Stress test 08/16/16: Stress test result No ischemia, normal wall motion Overall a normal study EF 70%  Echo 07/17/16: - Left ventricle: The cavity size was normal. Wall thickness was  increased in a pattern of mild LVH. Systolic function was normal.  The estimated ejection fraction was in the range of 50% to 55%.  Wall motion was normal; there were no regional wall motion  abnormalities. Doppler parameters are consistent with abnormal  left ventricular relaxation (grade 1 diastolic dysfunction).  Doppler parameters are consistent with high ventricular filling  pressure.  - Aortic valve: There was trivial regurgitation.    Neuro/Psych  Headaches, neg Seizures CVA, No Residual Symptoms  negative psych ROS   GI/Hepatic negative GI ROS, Neg liver ROS,,,  Endo/Other  negative endocrine ROSneg diabetes    Renal/GU negative Renal ROS     Musculoskeletal  (+) Arthritis ,    Abdominal  (+) - obese  Peds  Hematology negative hematology ROS (+)   Anesthesia Other Findings Past Medical History: No date: Anginal pain (HCC) No date: Arthritis No date: Body mass  index (BMI) of 24.0-24.9 in adult No date: Cardiovascular disease No date: Colostomy present (HCC) No date: Current every day smoker     Comment:  a. ~35 years - > has cut down to < 3/4 ppd No date: Headache No date: History of rectal cancer No date: History of UTI No date: Hypertension No date: Midsternal chest pain     Comment:  a. 05/2014 Stress Echo: Ex time: 8:09, ECG w/o acute               changes, EF nl with possible mid and apical anterior               HK-->cath recommended. 2017: Personal history of chemotherapy 2017: Personal history of radiation therapy No date: Port-A-Cath in place     Comment:  RIGHT SIDE 2017: Rectal cancer (HCC) No date: Stroke [facility])     Comment:  07/2016 tia no neurology followup now seen by pcp No date: Syncope and collapse 2018: Thyroid nodule     Comment:  FINDINGS CONSISTENT WITH A HURTHLE CELL LESION AND/OR               NEOPLASM (BETHESDA CATEGORY IV).   Reproductive/Obstetrics                             Anesthesia Physical Anesthesia Plan  ASA: III  Anesthesia Plan: General   Post-op Pain Management: Minimal or no pain anticipated   Induction: Intravenous  PONV Risk Score and Plan: 1 and Propofol infusion  Airway Management Planned: Natural Airway and Nasal Cannula  Additional Equipment: None  Intra-op Plan:  Post-operative Plan:   Informed Consent: I have reviewed the patients History and Physical, chart, labs and discussed the procedure including the risks, benefits and alternatives for the proposed anesthesia with the patient or authorized representative who has indicated his/her understanding and acceptance.     Dental advisory given  Plan Discussed with: CRNA and Anesthesiologist  Anesthesia Plan Comments: (Discussed risks of anesthesia with patient, including possibility of difficulty with spontaneous ventilation under anesthesia necessitating airway intervention, PONV, and rare risks such  as cardiac or respiratory or neurological events, and allergic reactions. Discussed the role of CRNA in patient's perioperative care. Patient understands.)        Anesthesia Quick Evaluation

## 2023-03-21 NOTE — Anesthesia Postprocedure Evaluation (Signed)
Anesthesia Post Note  Patient: Rachel [name]  Procedure(s) Performed: COLONOSCOPY WITH PROPOFOL  Patient location during evaluation: PACU Anesthesia Type: General Level of consciousness: awake and alert Pain management: pain level controlled Vital Signs Assessment: post-procedure vital signs reviewed and stable Respiratory status: spontaneous breathing, nonlabored ventilation, respiratory function stable and patient connected to nasal cannula oxygen Cardiovascular status: blood pressure returned to baseline and stable Postop Assessment: no apparent nausea or vomiting Anesthetic complications: no  No notable events documented.   Last Vitals:  Vitals:   03/21/23 0925 03/21/23 0935  BP: 105/76 110/78  Pulse: 73 67  Resp: (!) 21 20  Temp:    SpO2: 99% 96%    Last Pain:  Vitals:   03/21/23 0925  TempSrc:   PainSc: 0-No pain                 Stephanie Coup

## 2023-03-21 NOTE — Anesthesia Procedure Notes (Signed)
Date/Time: 03/21/2023 8:30 AM  Performed by: Stormy Fabian, CRNAPre-anesthesia Checklist: Patient identified, Emergency Drugs available, Suction available and Patient being monitored Patient Re-evaluated:Patient Re-evaluated prior to induction Oxygen Delivery Method: Nasal cannula Induction Type: IV induction Dental Injury: Teeth and Oropharynx as per pre-operative assessment  Comments: Nasal cannula with etCO2 monitoring

## 2023-03-22 ENCOUNTER — Encounter: Payer: Self-pay | Admitting: Surgery

## 2023-03-22 LAB — SURGICAL PATHOLOGY

## 2023-03-26 ENCOUNTER — Ambulatory Visit
Admission: RE | Admit: 2023-03-26 | Discharge: 2023-03-26 | Disposition: A | Discharge status: Home / Self Care | Payer: Medicare PPO | Source: Ambulatory Visit | Attending: Family Medicine | Admitting: Family Medicine

## 2023-03-26 DIAGNOSIS — Z780 Asymptomatic menopausal state: Secondary | ICD-10-CM | POA: Insufficient documentation

## 2023-03-26 DIAGNOSIS — N63 Unspecified lump in unspecified breast: Secondary | ICD-10-CM | POA: Diagnosis present

## 2023-03-26 DIAGNOSIS — R928 Other abnormal and inconclusive findings on diagnostic imaging of breast: Secondary | ICD-10-CM

## 2023-04-18 ENCOUNTER — Encounter: Payer: Self-pay | Admitting: Surgery

## 2023-04-20 ENCOUNTER — Other Ambulatory Visit: Payer: Self-pay

## 2023-04-20 ENCOUNTER — Inpatient Hospital Stay
Admission: RE | Admit: 2023-04-20 | Discharge: 2023-04-20 | Disposition: A | Discharge status: Home / Self Care | Payer: Self-pay | Source: Ambulatory Visit | Attending: Neurosurgery | Admitting: Neurosurgery

## 2023-04-20 DIAGNOSIS — Z049 Encounter for examination and observation for unspecified reason: Secondary | ICD-10-CM

## 2023-04-25 NOTE — Progress Notes (Unsigned)
Referring Physician:  Denton Lank, FNP [id number] [place],  [place] [address]  Primary Physician:  Dortha Kern, MD  History of Present Illness: 04/26/2023 [name] is here today with a chief complaint of  low back pain that radiates into the right leg down to the foot.  She has mostly back pain, but does have leg pain.  It is made worse by walking and standing.  Her back pain began in 2017 after she had surgery for colorectal cancer.  Is been consistent since that time and has caused substantial impact on her ability to live her day-to-day life.  Her leg pain is less problematic.   Bowel/Bladder Dysfunction: none  Conservative measures:  Physical therapy: has participated in at [facility] in March/April 2024 and was discharged Multimodal medical therapy including regular antiinflammatories:  tramadol, gabapentin  Injections: has received epidural steroid injections 03/27/23: Right L5-S1 and Right S1 TF ESI (relief for 1 day) . Past Surgery:  Back surgery in 2000 by Kritzer   Rachel [name] has no symptoms of cervical myelopathy.  The symptoms are causing a significant impact on the patient's life.   I have utilized the care everywhere function in epic to review the outside records available from external health systems.  Review of Systems:  A 10 point review of systems is negative, except for the pertinent positives and negatives detailed in the HPI.  Past Medical History: Past Medical History:  Diagnosis Date   Anginal pain (HCC)    Arthritis    Body mass index (BMI) of 24.0-24.9 in adult    Cardiovascular disease    Colostomy present (HCC)    Current every day smoker    a. ~35 years - > has cut down to < 3/4 ppd   Headache    History of rectal cancer    History of UTI    Hypertension    Midsternal chest pain    a. 05/2014 Stress Echo: Ex time: 8:09, ECG w/o acute changes, EF nl with possible mid and apical anterior HK-->cath recommended.    Personal history of chemotherapy 2017   Personal history of radiation therapy 2017   Port-A-Cath in place    RIGHT SIDE   Rectal cancer (HCC) 2017   Stroke (HCC)    07/2016 tia no neurology followup now seen by pcp   Syncope and collapse    Thyroid nodule 2018   FINDINGS CONSISTENT WITH A HURTHLE CELL LESION AND/OR NEOPLASM (BETHESDA CATEGORY IV).    Past Surgical History: Past Surgical History:  Procedure Laterality Date   ABDOMINAL HYSTERECTOMY  1999   ABDOMINAL PERINEAL BOWEL RESECTION N/A 03/16/2017   Procedure: ABDOMINAL PERINEAL RESECTION;  Surgeon: Kieth Brightly, MD;  Location: [facility] ORS;  Service: General;  Laterality: N/A;   APPENDECTOMY  03/16/2017   Procedure: APPENDECTOMY;  Surgeon: Kieth Brightly, MD;  Location: [facility] ORS;  Service: General;;   BACK SURGERY  2000   l 3,4,5   BIOPSY THYROID Right 11/16/2016   FINDINGS CONSISTENT WITH A HURTHLE CELL LESION AND/OR NEOPLASM (BETHESDA CATEGORY IV).   CHOLECYSTECTOMY  1983   COLON SURGERY     COLONOSCOPY WITH PROPOFOL N/A 08/15/2016   Procedure: COLONOSCOPY WITH PROPOFOL;  Surgeon: Christena Deem, MD;  Location: [facility] ENDOSCOPY;  Service: Endoscopy;  Laterality: N/A;   COLONOSCOPY WITH PROPOFOL N/A 11/08/2016   Procedure: COLONOSCOPY WITH PROPOFOL;  Surgeon: Kieth Brightly, MD;  Location: [facility] ENDOSCOPY;  Service: Endoscopy;  Laterality:  N/A;   COLONOSCOPY WITH PROPOFOL N/A 08/20/2020   Procedure: COLONOSCOPY WITH PROPOFOL;  Surgeon: Earline Mayotte, MD;  Location: [facility] ENDOSCOPY;  Service: Endoscopy;  Laterality: N/A;   COLONOSCOPY WITH PROPOFOL N/A 03/21/2023   Procedure: COLONOSCOPY WITH PROPOFOL;  Surgeon: Sung Amabile, DO;  Location: [facility] ENDOSCOPY;  Service: General;  Laterality: N/A;   COLOSTOMY N/A 11/23/2016   Procedure: SIGMOID COLOSTOMY;  Surgeon: Kieth Brightly, MD;  Location: [facility];  Service: General;  Laterality: N/A;   ERCP     INCISION AND DRAINAGE PERIRECTAL ABSCESS N/A  04/15/2018   Procedure: RECTAL ABSCESS;  Surgeon: Earline Mayotte, MD;  Location: [facility] OR[facility];  Service: General;  Laterality: N/A;   LAPAROSCOPY N/A 11/23/2016   Procedure: LAPAROSCOPY DIAGNOSTIC;  Surgeon: Kieth Brightly, MD;  Location: [facility];  Service: General;  Laterality: N/A;   PARTIAL HYSTERECTOMY     PORTACATH PLACEMENT N/A 08/18/2016   Procedure: INSERTION PORT-A-CATH;  Surgeon: Kieth Brightly, MD;  Location: [facility];  Service: General;  Laterality: N/A;   THYROID LOBECTOMY Right 11/05/2017   Procedure: THYROID LOBECTOMY;  Surgeon: Kieth Brightly, MD;  Location: [facility] OR[facility];  Service: General;  Laterality: Right;    Allergies: Allergies as of 04/26/2023 - Review Complete 04/26/2023  Allergen Reaction Noted   Prednisone Other (See Comments) 08/11/2016    Medications:  Current Outpatient Medications:    acetaminophen (TYLENOL) 500 MG tablet, Take 500 mg by mouth every 6 (six) hours as needed., Disp: , Rfl:    amLODipine (NORVASC) 10 MG tablet, Take 10 mg by mouth daily., Disp: , Rfl:    aspirin EC 81 MG tablet, Take 81 mg by mouth daily., Disp: , Rfl:    Cholecalciferol (VITAMIN D3 PO), Take by mouth daily., Disp: , Rfl:    Cyanocobalamin (B-12 PO), Inject 10,000 mcg as directed once a week., Disp: , Rfl:    HYDROcodone-acetaminophen (NORCO/VICODIN) 5-325 MG tablet, Take 1 tablet by mouth every 8 (eight) hours as needed for moderate pain. (Patient not taking: Reported on 03/21/2023), Disp: 45 tablet, Rfl: 0   lidocaine-prilocaine (EMLA) cream, Apply 1 application topically as needed (for port access). (Patient not taking: Reported on 03/21/2023), Disp: 30 g, Rfl: 3   lisinopril (PRINIVIL,ZESTRIL) 10 MG tablet, Take 10 mg by mouth daily. , Disp: , Rfl:    nitroGLYCERIN (NITROSTAT) 0.4 MG SL tablet, Place 1 tablet (0.4 mg total) under the tongue every 5 (five) minutes as needed for chest pain., Disp: 25 tablet, Rfl: 6   potassium chloride SA (KLOR-CON M20) 20  MEQ tablet, TAKE 1 TABLET BY MOUTH TWICE A DAY (Patient not taking: Reported on 03/21/2023), Disp: 180 tablet, Rfl: 1   prochlorperazine (COMPAZINE) 10 MG tablet, Take 1 tablet (10 mg total) by mouth every 6 (six) hours as needed for nausea or vomiting., Disp: 60 tablet, Rfl: 1   traMADol (ULTRAM) 50 MG tablet, Take 1 tablet (50 mg total) by mouth every 6 (six) hours as needed (for pain.). (Patient not taking: Reported on 03/21/2023), Disp: 30 tablet, Rfl: 0   venlafaxine XR (EFFEXOR-XR) 150 MG 24 hr capsule, Take 150 mg by mouth daily with breakfast., Disp: , Rfl:   Social History: Social History   Tobacco Use   Smoking status: Every Day    Packs/day: .25    Types: Cigarettes   Smokeless tobacco: Never  Vaping Use   Vaping Use: Never used  Substance Use Topics   Alcohol use: No   Drug use: No  Family Medical History: Family History  Problem Relation Age of Onset   Breast cancer Mother 42   Hyperlipidemia Father    Heart disease Paternal Grandmother    Stroke Maternal Grandfather    Prostate cancer Brother     Physical Examination: Vitals:   04/26/23 1515  BP: 136/82    General: Patient is in no apparent distress. Attention to examination is appropriate.  Neck:   Supple.  Full range of motion.  Respiratory: Patient is breathing without any difficulty.   NEUROLOGICAL:     Awake, alert, oriented to person, place, and time.  Speech is clear and fluent.   Cranial Nerves: Pupils equal round and reactive to light.  Facial tone is symmetric.  Facial sensation is symmetric. Shoulder shrug is symmetric. Tongue protrusion is midline.  There is no pronator drift.  Strength: Side Biceps Triceps Deltoid Interossei Grip Wrist Ext. Wrist Flex.  R 5 5 5 5 5 5 5   L 5 5 5 5 5 5 5    Side Iliopsoas Quads Hamstring PF DF EHL  R 5 5 5 5 5 5   L 5 5 5 5 5 5    Reflexes are 1+ and symmetric at the biceps, triceps, brachioradialis, patella and achilles.   Hoffman's is absent.    Bilateral upper and lower extremity sensation is intact to light touch.    No evidence of dysmetria noted.  Gait is antalgic.     Medical Decision Making  Imaging: MRI L spine 03/06/2023 IMPRESSION: 1. L5-S1 right foraminal extrusion with advanced impingement. 2. L4-5 left foraminal herniation and impingement. 3. Generalized lumbar spine degeneration with mild scoliosis. Diffusely patent spinal canal.     Electronically Signed   By: Tiburcio Pea M.D.   On: 03/06/2023 16:46  I have personally reviewed the images and agree with the above interpretation.  Assessment and Plan: Ms. Yturralde is a pleasant 67 y.o. female with back pain more so than leg pain.  She does have some features of right-sided L5 radiculopathy, but I feel that her back pain is much more impactful than her leg pain.  I do not think that it is worthwhile to consider far lateral foraminotomy at L5-S1 currently, as her primary complaint is back pain.  I think she should consider spinal cord stimulation as she has some features of postlaminectomy syndrome causing her chronic pain syndrome.  Will arrange that for her.    Thank you for involving me in the care of this patient.      Arren Laminack K. Myer Haff MD, [facility]

## 2023-04-26 ENCOUNTER — Encounter: Payer: Self-pay | Admitting: Neurosurgery

## 2023-04-26 ENCOUNTER — Ambulatory Visit: Payer: Medicare PPO | Admitting: Neurosurgery

## 2023-04-26 VITALS — BP 136/82 | Ht 66.0 in | Wt 186.4 lb

## 2023-04-26 DIAGNOSIS — M545 Low back pain, unspecified: Secondary | ICD-10-CM | POA: Diagnosis not present

## 2023-04-26 DIAGNOSIS — G894 Chronic pain syndrome: Secondary | ICD-10-CM | POA: Diagnosis not present

## 2023-04-26 NOTE — Patient Instructions (Signed)
Dr Rodenbough in [place] [phone] Dr Adam McDermott in [place] - also does televisits [phone] Dr Mount in [place] [phone] Dr Jeffrey Feldman in [place] - also does televisits [phone] [facility] (offices in , [place], and [place]) [phone] [facility] - televisits [phone] [facility] - televisits [phone]   Please let us know which one of the above psychologist's you would like to see for evaluation prior to the spinal cord stimulator trial. These are the only providers we are aware of that perform this type of evaluation. Once we fax the referral, you are required to call them to set up an appointment (they will not call you).  

## 2023-05-02 ENCOUNTER — Telehealth: Payer: Self-pay | Admitting: Neurosurgery

## 2023-05-02 NOTE — Telephone Encounter (Signed)
Pt called to inform us that her MRI is scheduled for 05/18/2023 and her psychology visit with [facility] in 05/07/2023

## 2023-05-02 NOTE — Telephone Encounter (Signed)
Noted  

## 2023-05-14 ENCOUNTER — Encounter: Payer: Self-pay | Admitting: Student in an Organized Health Care Education/Training Program

## 2023-05-14 ENCOUNTER — Ambulatory Visit
Payer: Medicare PPO | Attending: Student in an Organized Health Care Education/Training Program | Admitting: Student in an Organized Health Care Education/Training Program

## 2023-05-14 ENCOUNTER — Ambulatory Visit
Admission: RE | Admit: 2023-05-14 | Discharge: 2023-05-14 | Disposition: A | Discharge status: Home / Self Care | Payer: Medicare PPO | Source: Ambulatory Visit | Attending: Student in an Organized Health Care Education/Training Program | Admitting: Student in an Organized Health Care Education/Training Program

## 2023-05-14 VITALS — BP 129/92 | HR 96 | Temp 97.2°F | Resp 18 | Ht 66.0 in | Wt 188.0 lb

## 2023-05-14 DIAGNOSIS — M961 Postlaminectomy syndrome, not elsewhere classified: Secondary | ICD-10-CM | POA: Insufficient documentation

## 2023-05-14 DIAGNOSIS — M5416 Radiculopathy, lumbar region: Secondary | ICD-10-CM | POA: Insufficient documentation

## 2023-05-14 NOTE — Progress Notes (Signed)
Patient: Rachel [name]  Service Category: E/M  Provider: Edward Jolly, MD  DOB: September 24, 1956  DOS: 05/14/2023  Referring Provider: Venetia Night, MD  MRN: [id number]  Setting: Ambulatory outpatient  PCP: Dortha Kern, MD  Type: New Patient  Specialty: Interventional Pain Management    Location: Office  Delivery: Face-to-face     Primary Reason(s) for Visit: Encounter for initial evaluation of one or more chronic problems (new to examiner) potentially causing chronic pain, and posing a threat to normal musculoskeletal function. (Level of risk: High) CC: Back Pain (lower)  HPI  Rachel [name] is a 67 y.o. year old, female patient, who comes for the first time to our practice referred by Venetia Night, MD for our initial evaluation of her chronic pain. She has Former smoker; Essential hypertension; Chest pain with moderate risk for cardiac etiology; Rapid heart beat; Midsternal chest pain; Vertigo; Hypokalemia; Rectal mass; Coronary artery disease, non-occlusive; Aortic atherosclerosis (HCC); 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); Fever and chills; Failed back surgical syndrome; Pelvic abscess in female; Body mass index between 19-24, adult; Cardiovascular disease; Cerebrovascular accident [facility]); Lumbar post-laminectomy syndrome; and Lumbar radicular pain on their problem list. Today she comes in for evaluation of her Back Pain (lower)  Pain Assessment: Location: Lower, Right, Left Back Radiating: RIGHT down to toes (tingle); LEFT down to behind left knee Onset: More than a month ago Duration: Chronic pain Quality: Tingling, Spasm, Sharp, Constant, Dull, Aching Severity: 5 /10 (subjective, self-reported pain score)  Effect on ADL: limits daily activities, must take frequest breaks or ele "right leg gives out and I fall" Timing: Constant Modifying factors: nothing BP: (!) 129/92  HR: 96  Onset and Duration: Sudden and Date of onset: 2018 Cause of pain: Surgery for rectal  cancer Severity: Getting worse, NAS-11 at its worse: 10/10, NAS-11 at its best: 5/10, NAS-11 now: 5/10, and NAS-11 on the average: 6/10 Timing: Not influenced by the time of the day and During activity or exercise Aggravating Factors: Bending, Kneeling, Lifiting, Prolonged standing, Squatting, Stooping , Walking, Walking uphill, Walking downhill, and Working Alleviating Factors: Lying down, Medications, and Sitting Associated Problems: Night-time cramps, Fatigue, Inability to control bladder (urine), Numbness, Spasms, Tingling, Weakness, and Pain that wakes patient up Quality of Pain: Burning, Cramping, Exhausting, Feeling of constriction, Feeling of weight, Punishing, Sharp, Shooting, Tingling, and Uncomfortable Previous Examinations or Tests: Bone scan, MRI scan, Nerve block, Neurological evaluation, and Psychiatric evaluation Previous Treatments: Narcotic medications and Physical Therapy  Rachel [name] is being evaluated for possible interventional pain management therapies for the treatment of her chronic pain.   Patient is a pleasant 67 year old female who presents with a chief complaint of low back pain that radiates down into her right leg to her right foot.  She does have persistent numbness and tingling of her right leg.  She states that she has difficulty with plantarflexion at times.  The pain gets worse with walking and standing.  She states that she is mainly stationary and sitting.  She states that her low back pain got worse after her colorectal surgery in 2018.  Of note she has had lumbar spine surgery in 2000.  She has done physical therapy at [facility] in March and April 2024.  She has tried various anti-inflammatories including ibuprofen and naproxen along with other multimodal analgesics including gabapentin, tramadol.  She has had lumbar epidural steroid injection on April 2024 with no benefit.  She states that in fact it made her pain worse.  She denies any bowel or bladder dysfunction.   She is being referred here from Dr. Marcell Barlow to consider spinal cord stimulator trial.  Meds   Current Outpatient Medications:    acetaminophen (TYLENOL) 500 MG tablet, Take 500 mg by mouth every 6 (six) hours as needed., Disp: , Rfl:    amLODipine (NORVASC) 10 MG tablet, Take 10 mg by mouth daily., Disp: , Rfl:    atorvastatin (LIPITOR) 20 MG tablet, Take 20 mg by mouth daily., Disp: , Rfl:    etodolac (LODINE) 400 MG tablet, Take 400 mg by mouth 2 (two) times daily., Disp: , Rfl:    nitroGLYCERIN (NITROSTAT) 0.4 MG SL tablet, Place 1 tablet (0.4 mg total) under the tongue every 5 (five) minutes as needed for chest pain., Disp: 25 tablet, Rfl: 6   omeprazole (PRILOSEC) 20 MG capsule, Take 20 mg by mouth daily., Disp: , Rfl:    solifenacin (VESICARE) 5 MG tablet, Take 5 mg by mouth daily., Disp: , Rfl:    traMADol (ULTRAM) 50 MG tablet, Take 1 tablet (50 mg total) by mouth every 6 (six) hours as needed (for pain.)., Disp: 30 tablet, Rfl: 0   venlafaxine XR (EFFEXOR-XR) 150 MG 24 hr capsule, Take 150 mg by mouth daily with breakfast., Disp: , Rfl:    aspirin EC 81 MG tablet, Take 81 mg by mouth daily. (Patient not taking: Reported on 05/14/2023), Disp: , Rfl:   Imaging Review  MR LUMBAR SPINE WO CONTRAST  Narrative CLINICAL DATA:  Low back pain with right leg pain since 2018  EXAM: MRI LUMBAR SPINE WITHOUT CONTRAST  TECHNIQUE: Multiplanar, multisequence MR imaging of the lumbar spine was performed. No intravenous contrast was administered.  COMPARISON:  09/26/2017  FINDINGS: Segmentation:  Standard.  Alignment:  Dextroscoliosis.  Vertebrae:  No fracture, evidence of discitis, or bone lesion.  Conus medullaris and cauda equina: Conus extends to the L1-2 level. Conus and cauda equina appear normal.  Paraspinal and other soft tissues: Negative for perispinal mass or inflammation.  Disc levels:  T12- L1: Mild disc bulging  L1-L2: Mild facet spurring  L2-L3: Disc height  loss and bulging with ventral spondylitic spurring  L3-L4: Disc narrowing with endplate spurring.  L4-L5: Disc narrowing and bulging with endplate and facet spurring. Left foraminal herniation with left L4 impingement. Patent spinal canal  L5-S1:Disc narrowing and bulging with right foraminal extrusion. Degenerative facet spurring on both sides. Moderate left foraminal narrowing.  IMPRESSION: 1. L5-S1 right foraminal extrusion with advanced impingement. 2. L4-5 left foraminal herniation and impingement. 3. Generalized lumbar spine degeneration with mild scoliosis. Diffusely patent spinal canal.   Electronically Signed By: Tiburcio Pea M.D. On: 03/06/2023 16:46    MR Lumbar Spine W Wo Contrast  Narrative CLINICAL DATA:  Low back pain for 1 month radiating into the left hip and thigh. Patient diagnosed with rectal carcinoma in September, 2017. History of lumbar spine surgery in 2001.  EXAM: MRI LUMBAR SPINE WITHOUT AND WITH CONTRAST  TECHNIQUE: Multiplanar and multiecho pulse sequences of the lumbar spine were obtained without and with intravenous contrast.  CONTRAST:  14 ml MULTIHANCE GADOBENATE DIMEGLUMINE 529 MG/ML IV SOLN  COMPARISON:  Plain films lumbar spine 03/05/2017. CT abdomen and pelvis 11/15/2016.  FINDINGS: Segmentation:  Standard.  Alignment: There is straightening of the normal lumbar lordosis. Mild convex right scoliosis is noted.  Vertebrae: There is abnormal marrow edema and enhancement in the sacrum bilaterally which is partially imaged. Signal change appears more extensive on the left.  Bone marrow signal is otherwise normal.  Conus medullaris: Extends to the L1 level and appears normal.  Paraspinal and other soft tissues: Small left renal cyst is incidentally noted. Fat anterior to the sacrum appears partially obliterated likely related to prior treatment for rectal carcinoma.  Disc levels:  T12-L1:  Very small central protrusion  without stenosis.  L1-2:  Negative.  L2-3:  Shallow disc bulge without stenosis.  L3-4: Right laminotomy defect is identified. No disc bulge or protrusion. The central canal and foramina are widely patent.  L4-5: Shallow disc bulge with endplate spurring. Small left foraminal protrusion is identified. There is mild left foraminal narrowing. The central canal and right foramen are open.  L5-S1:  Shallow disc bulge without stenosis.  IMPRESSION: Marrow edema and enhancement in the sacrum bilaterally is likely due to sacral insufficiency fractures. MRI of the sacrum with and without contrast is recommended for further evaluation.  Mild lumbar degenerative change without central canal or foraminal stenosis. Postoperative change on the right at L3-4 without complicating feature is noted.   Electronically Signed By: Drusilla Kanner M.D. On: 09/26/2017 10:44    DG Lumbar Spine 2-3 Views  Narrative CLINICAL DATA:  Chronic back pain, weakness.  EXAM: LUMBAR SPINE - 2-3 VIEW  COMPARISON:  CT abdomen pelvis 11/15/2016.  FINDINGS: Dextroconvex scoliosis of approximately 12 degrees. Straightening of the normal lumbar lordosis. There may be minimal retrolisthesis of L5 on S1. Endplate degenerative changes from L2-3 to L4-5. Loss of disc space height at L3-4 and L4-5. Facet hypertrophy in the mid and lower lumbar spine. Atherosclerotic calcification of the arterial vasculature.  IMPRESSION: 1. Multilevel degenerative disc disease, worst at L3-4 and L4-5. 2. Minimal retrolisthesis of L5 on S1. 3. Mild dextroconvex scoliosis and straightening of the normal lumbar lordosis.   Electronically Signed By: Leanna Battles M.D. On: 03/05/2017 13:10   Complexity Note: Imaging results reviewed.                         ROS  Cardiovascular: Daily Aspirin intake and High blood pressure Pulmonary or Respiratory: Smoking, Snoring , and Coughing up mucus (Bronchitis) Neurological:  Incontinence:  Urinary Psychological-Psychiatric: Depressed Gastrointestinal: Reflux or heatburn Genitourinary: Peeing blood Hematological: Brusing easily Endocrine: No reported endocrine signs or symptoms such as high or low blood sugar, rapid heart rate due to high thyroid levels, obesity or weight gain due to slow thyroid or thyroid disease Rheumatologic: Joint aches and or swelling due to excess weight (Osteoarthritis) Musculoskeletal: Negative for myasthenia gravis, muscular dystrophy, multiple sclerosis or malignant hyperthermia Work History: Retired  Allergies  Ms. Vano is allergic to prednisone.  Laboratory Chemistry Profile   Renal Lab Results  Component Value Date   BUN 12 08/29/2019   CREATININE 0.74 08/29/2019   BCR 16 03/08/2017   GFRAA >60 08/29/2019   GFRNONAA >60 08/29/2019   SPECGRAV 1.020 08/21/2018   PHUR 5.5 08/21/2018   PROTEINUR Negative 08/21/2018     Electrolytes Lab Results  Component Value Date   NA 138 08/29/2019   K 3.2 (L) 08/29/2019   CL 100 08/29/2019   CALCIUM 9.0 08/29/2019   MG 2.1 06/11/2019     Hepatic Lab Results  Component Value Date   AST 20 08/29/2019   ALT 14 08/29/2019   ALBUMIN 3.7 08/29/2019   ALKPHOS 131 (H) 08/29/2019   LIPASE 174 07/22/2014     ID Lab Results  Component Value Date   SARSCOV2NAA NEGATIVE 08/18/2020  STAPHAUREUS NEGATIVE 11/20/2016   MRSAPCR NEGATIVE 11/20/2016     Bone No results found for: "VD25OH", "VD125OH2TOT", "ZO1096EA5", "WU9811BJ4", "25OHVITD1", "25OHVITD2", "25OHVITD3", "TESTOFREE", "TESTOSTERONE"   Endocrine Lab Results  Component Value Date   GLUCOSE 97 08/29/2019   GLUCOSEU Negative 08/21/2018   HGBA1C 5.5 07/17/2016   TSH 0.22 (L) 07/23/2014     Neuropathy Lab Results  Component Value Date   HGBA1C 5.5 07/17/2016     CNS No results found for: "COLORCSF", "APPEARCSF", "RBCCOUNTCSF", "WBCCSF", "POLYSCSF", "LYMPHSCSF", "EOSCSF", "PROTEINCSF", "GLUCCSF", "JCVIRUS",  "CSFOLI", "IGGCSF", "LABACHR", "ACETBL"   Inflammation (CRP: Acute  ESR: Chronic) No results found for: "CRP", "ESRSEDRATE", "LATICACIDVEN"   Rheumatology No results found for: "RF", "ANA", "LABURIC", "URICUR", "LYMEIGGIGMAB", "LYMEABIGMQN", "HLAB27"   Coagulation Lab Results  Component Value Date   INR 0.94 07/17/2016   LABPROT 12.6 07/17/2016   APTT 27 07/17/2016   PLT 206 08/29/2019     Cardiovascular Lab Results  Component Value Date   BNP 1,509 (H) 07/22/2014   CKTOTAL 85 07/22/2014   CKMB 0.7 07/22/2014   TROPONINI 0.05 07/22/2014   HGB 12.4 08/29/2019   HCT 37.9 08/29/2019     Screening Lab Results  Component Value Date   SARSCOV2NAA NEGATIVE 08/18/2020   STAPHAUREUS NEGATIVE 11/20/2016   MRSAPCR NEGATIVE 11/20/2016     Cancer Lab Results  Component Value Date   CEA 2.8 04/06/2017     Allergens No results found for: "ALMOND", "APPLE", "ASPARAGUS", "AVOCADO", "BANANA", "BARLEY", "BASIL", "BAYLEAF", "GREENBEAN", "LIMABEAN", "WHITEBEAN", "BEEFIGE", "REDBEET", "BLUEBERRY", "BROCCOLI", "CABBAGE", "MELON", "CARROT", "CASEIN", "CASHEWNUT", "CAULIFLOWER", "CELERY"     Note: Lab results reviewed.  PFSH  Drug: Ms. Amburgy  reports no history of drug use. Alcohol:  reports no history of alcohol use. Tobacco:  reports that she has been smoking cigarettes. She has been smoking an average of .25 packs per day. She has never used smokeless tobacco. Medical:  has a past medical history of Anginal pain (HCC), Arthritis, Body mass index (BMI) of 24.0-24.9 in adult, Cardiovascular disease, Colostomy present (HCC), Current every day smoker, Headache, History of rectal cancer, History of UTI, Hypertension, Midsternal chest pain, Personal history of chemotherapy (2017), Personal history of radiation therapy (2017), Port-A-Cath in place, Rectal cancer (HCC) (2017), Stroke [facility]), Syncope and collapse, and Thyroid nodule (2018). Family: family history includes Breast cancer (age of  onset: 61) in her mother; Heart disease in her paternal grandmother; Hyperlipidemia in her father; Prostate cancer in her brother; Stroke in her maternal grandfather.  Past Surgical History:  Procedure Laterality Date   ABDOMINAL HYSTERECTOMY  1999   ABDOMINAL PERINEAL BOWEL RESECTION N/A 03/16/2017   Procedure: ABDOMINAL PERINEAL RESECTION;  Surgeon: Kieth Brightly, MD;  Location: [facility] ORS;  Service: General;  Laterality: N/A;   APPENDECTOMY  03/16/2017   Procedure: APPENDECTOMY;  Surgeon: Kieth Brightly, MD;  Location: [facility];  Service: General;;   BACK SURGERY  2000   l 3,4,5   BIOPSY THYROID Right 11/16/2016   FINDINGS CONSISTENT WITH A HURTHLE CELL LESION AND/OR NEOPLASM (BETHESDA CATEGORY IV).   CHOLECYSTECTOMY  1983   COLON SURGERY     COLONOSCOPY WITH PROPOFOL N/A 08/15/2016   Procedure: COLONOSCOPY WITH PROPOFOL;  Surgeon: Christena Deem, MD;  Location: [facility] ENDOSCOPY;  Service: Endoscopy;  Laterality: N/A;   COLONOSCOPY WITH PROPOFOL N/A 11/08/2016   Procedure: COLONOSCOPY WITH PROPOFOL;  Surgeon: Kieth Brightly, MD;  Location: [facility] ENDOSCOPY;  Service: Endoscopy;  Laterality: N/A;   COLONOSCOPY WITH PROPOFOL N/A 08/20/2020  Procedure: COLONOSCOPY WITH PROPOFOL;  Surgeon: Earline Mayotte, MD;  Location: [facility] ENDOSCOPY;  Service: Endoscopy;  Laterality: N/A;   COLONOSCOPY WITH PROPOFOL N/A 03/21/2023   Procedure: COLONOSCOPY WITH PROPOFOL;  Surgeon: Sung Amabile, DO;  Location: [facility] ENDOSCOPY;  Service: General;  Laterality: N/A;   COLOSTOMY N/A 11/23/2016   Procedure: SIGMOID COLOSTOMY;  Surgeon: Kieth Brightly, MD;  Location: [facility];  Service: General;  Laterality: N/A;   ERCP     INCISION AND DRAINAGE PERIRECTAL ABSCESS N/A 04/15/2018   Procedure: RECTAL ABSCESS;  Surgeon: Earline Mayotte, MD;  Location: [facility];  Service: General;  Laterality: N/A;   LAPAROSCOPY N/A 11/23/2016   Procedure: LAPAROSCOPY DIAGNOSTIC;  Surgeon:  Kieth Brightly, MD;  Location: [facility];  Service: General;  Laterality: N/A;   PARTIAL HYSTERECTOMY     PORTACATH PLACEMENT N/A 08/18/2016   Procedure: INSERTION PORT-A-CATH;  Surgeon: Kieth Brightly, MD;  Location: [facility];  Service: General;  Laterality: N/A;   THYROID LOBECTOMY Right 11/05/2017   Procedure: THYROID LOBECTOMY;  Surgeon: Kieth Brightly, MD;  Location: [facility];  Service: General;  Laterality: Right;   Active Ambulatory Problems    Diagnosis Date Noted   Former smoker    Essential hypertension    Chest pain with moderate risk for cardiac etiology 05/08/2014   Rapid heart beat 05/08/2014   Midsternal chest pain 06/03/2014   Vertigo 07/17/2016   Hypokalemia 07/17/2016   Rectal mass 08/15/2016   Coronary artery disease, non-occlusive 08/15/2016   Aortic atherosclerosis (HCC)    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) 08/16/2016   Fever and chills 08/06/2017   Failed back surgical syndrome 09/17/2017   Pelvic abscess in female 04/04/2018   Body mass index between 19-24, adult 06/21/2016   Cardiovascular disease 08/06/2014   Cerebrovascular accident (HCC) 08/01/2016   Lumbar post-laminectomy syndrome 05/14/2023   Lumbar radicular pain 05/14/2023   Resolved Ambulatory Problems    Diagnosis Date Noted   No Resolved Ambulatory Problems   Past Medical History:  Diagnosis Date   Anginal pain (HCC)    Arthritis    Body mass index (BMI) of 24.0-24.9 in adult    Colostomy present [facility])    Current every day smoker    Headache    History of rectal cancer    History of UTI    Hypertension    Personal history of chemotherapy 2017   Personal history of radiation therapy 2017   Port-A-Cath in place    Rectal cancer (HCC) 2017   Stroke [facility])    Syncope and collapse    Thyroid nodule 2018   Constitutional Exam  General appearance: Well nourished, well developed, and well hydrated. In no apparent acute distress Vitals:    05/14/23 1355  BP: (!) 129/92  Pulse: 96  Resp: 18  Temp: (!) 97.2 F (36.2 C)  TempSrc: Temporal  SpO2: 95%  Weight: 188 lb (85.3 kg)  Height: 5\' 6"  (1.676 m)   BMI Assessment: Estimated body mass index is 30.34 kg/m as calculated from the following:   Height as of this encounter: 5\' 6"  (1.676 m).   Weight as of this encounter: 188 lb (85.3 kg).  BMI interpretation table: BMI level Category Range association with higher incidence of chronic pain  <18 kg/m2 Underweight   18.5-24.9 kg/m2 Ideal body weight   25-29.9 kg/m2 Overweight Increased incidence by 20%  30-34.9 kg/m2 Obese (Class I) Increased incidence by 68%  35-39.9 kg/m2 Severe obesity (  Class II) Increased incidence by 136%  >40 kg/m2 Extreme obesity (Class III) Increased incidence by 254%   Patient's current BMI Ideal Body weight  Body mass index is 30.34 kg/m. Ideal body weight: 59.3 kg (130 lb 11.7 oz) Adjusted ideal body weight: 69.7 kg (153 lb 10.2 oz)   BMI Readings from Last 4 Encounters:  05/14/23 30.34 kg/m  04/26/23 30.09 kg/m  03/21/23 30.41 kg/m  08/20/20 25.02 kg/m   Wt Readings from Last 4 Encounters:  05/14/23 188 lb (85.3 kg)  04/26/23 186 lb 6.4 oz (84.6 kg)  03/21/23 188 lb 6.1 oz (85.5 kg)  08/20/20 155 lb (70.3 kg)    Psych/Mental status: Alert, oriented x 3 (person, place, & time)       Eyes: PERLA Respiratory: No evidence of acute respiratory distress  Thoracic Spine Area Exam  Skin & Axial Inspection: No masses, redness, or swelling Alignment: Symmetrical Functional ROM: Unrestricted ROM Stability: No instability detected Muscle Tone/Strength: Functionally intact. No obvious neuro-muscular anomalies detected. Sensory (Neurological): Unimpaired Muscle strength & Tone: No palpable anomalies Lumbar Spine Area Exam  Skin & Axial Inspection: Well healed scar from previous spine surgery detected Alignment: Symmetrical Functional ROM: Pain restricted ROM affecting primarily the  right Stability: No instability detected Muscle Tone/Strength: Functionally intact. No obvious neuro-muscular anomalies detected. Sensory (Neurological): Dermatomal pain pattern  Gait & Posture Assessment  Ambulation: Unassisted Gait: Relatively normal for age and body habitus Posture: WNL  Lower Extremity Exam    Side: Right lower extremity  Side: Left lower extremity  Stability: No instability observed          Stability: No instability observed          Skin & Extremity Inspection: Skin color, temperature, and hair growth are WNL. No peripheral edema or cyanosis. No masses, redness, swelling, asymmetry, or associated skin lesions. No contractures.  Skin & Extremity Inspection: Skin color, temperature, and hair growth are WNL. No peripheral edema or cyanosis. No masses, redness, swelling, asymmetry, or associated skin lesions. No contractures.  Functional ROM: Pain restricted ROM for hip and knee joints          Functional ROM: Unrestricted ROM                  Muscle Tone/Strength: Functionally intact. No obvious neuro-muscular anomalies detected.  Muscle Tone/Strength: Functionally intact. No obvious neuro-muscular anomalies detected.  Sensory (Neurological): Neurogenic pain pattern        Sensory (Neurological): Unimpaired        DTR: Patellar: deferred today Achilles: deferred today Plantar: deferred today  DTR: Patellar: deferred today Achilles: deferred today Plantar: deferred today  Palpation: No palpable anomalies  Palpation: No palpable anomalies    Assessment  Primary Diagnosis & Pertinent Problem List: The primary encounter diagnosis was Lumbar post-laminectomy syndrome. Diagnoses of Failed back surgical syndrome and Lumbar radicular pain were also pertinent to this visit.  Visit Diagnosis (New problems to examiner): 1. Lumbar post-laminectomy syndrome   2. Failed back surgical syndrome   3. Lumbar radicular pain    Plan of Care (Initial workup plan)  I discussed   percutaneous spinal cord stimulator trial with the patient in detail.  I was able to view the patient's interlaminar windows under live fluoroscopy and they appear patent for percutaneous access.  I explained to the patient that they will have an external power source and programmer which the patient will use for 7 days. There will be daily communication with the stimulator company and the  patient. A possible need for a mid-trial clinic visit to give the patient the best chance of success.   Patient has completed a thorough psychosocial behavioral evaluation. We will request those records.  Some of patient's pain does seem to be mechanical in nature, with some component of neurogenic pain as well. We discussed the indications for spinal cord stimulation, specifically stating that it is typically better for neuropathic and appendicular pain, but that we have had some success in the treatment of low back and hip pain.   Patient is interested in proceeding with spinal cord stimulation trial. She understands that this may not be successful, and that spinal cord stimulation in general is not a "magic bullet."   We had a lengthy and very detailed discussion of all the risks, benefits, alternatives, and rationale of surgery as well as the option of continuing nonsurgical therapies. We specifically discussed the risks of temporary or permanent worsened neurologic injury, no symptomatic relief or pain made worse after procedure, and also the need for future surgery (due to infection, CSF leak, bleeding, adjacent segment issues, bone-healing difficulties, and other related issues). No guarantees of outcome were made or implied and she is eager to proceed and presents for definitive treatment.  Pt told me that all of her questions were answered thoroughly and to her satisfaction. Confidence and understanding of the discussed risks and consequences of  treatment was expressed and she accepted these risks and was eager  to proceed with procedure.   Issues concerning treatment and diagnosis were discussed with the patient. There are no barriers to understanding the plan of treatment. Explanation was well received by patient and/or family who then verbalized understanding.    Imaging Orders         DG PAIN CLINIC C-ARM 1-60 MIN NO REPORT      Procedure Orders         [place] TRIAL     Orders Placed This Encounter  Procedures   [place] TRIAL    Contact medical implant company representative to notify them of the scheduled case and to make sure they will be available to provide required equipment.    Standing Status:   Future    Standing Expiration Date:   08/14/2023    Scheduling Instructions:     Side: Bilateral     Level: Lumbar     Device: [employer]     Sedation: With sedation     Timeframe: As soon as pre-approved    Order Specific Question:   Where will this procedure be performed?    Answer:   [facility]   DG PAIN CLINIC C-ARM 1-60 MIN NO REPORT    Intraoperative interpretation by procedural physician at [facility].    Standing Status:   Standing    Number of Occurrences:   1    Order Specific Question:   Reason for exam:    Answer:   Assistance in needle guidance and placement for procedures requiring needle placement in or near specific anatomical locations not easily accessible without such assistance.     Provider-requested follow-up: Return in about 1 month (around 06/13/2023) for [employer] SCS trial, ECT.  Future Appointments  Date Time Provider Department Center  05/18/2023  4:50 PM [facility] [place] [facility]    Duration of encounter: .  Total time on encounter, as per AMA guidelines included both the face-to-face and non-face-to-face time personally spent by the physician and/or other qualified health care professional(s) on the  day of the encounter (includes time in activities that require the physician or other qualified health care professional  and does not include time in activities normally performed by clinical staff). Physician's time may include the following activities when performed: Preparing to see the patient (e.g., pre-charting review of records, searching for previously ordered imaging, lab work, and nerve conduction tests) Review of prior analgesic pharmacotherapies. Reviewing PMP Interpreting ordered tests (e.g., lab work, imaging, nerve conduction tests) Performing post-procedure evaluations, including interpretation of diagnostic procedures Obtaining and/or reviewing separately obtained history Performing a medically appropriate examination and/or evaluation Counseling and educating the patient/family/caregiver Ordering medications, tests, or procedures Referring and communicating with other health care professionals (when not separately reported) Documenting clinical information in the electronic or other health record Independently interpreting results (not separately reported) and communicating results to the patient/ family/caregiver Care coordination (not separately reported)  Note by: Edward Jolly, MD (TTS technology used. I apologize for any typographical errors that were not detected and corrected.) Date: 05/14/2023; Time: 3:06 PM

## 2023-05-14 NOTE — Patient Instructions (Addendum)
Notify our office once MRI has been completed. [phone]  Moderate Conscious Sedation, Adult Sedation is the use of medicines to help you relax and not feel pain. Moderate conscious sedation is a type of sedation that makes you less alert than normal. You are still able to respond to instructions, touch, or both. This type of sedation is used during short medical and dental procedures. It is milder than deep sedation, which is a type of sedation you cannot be easily woken up from. It is also milder than general anesthesia, which is the use of medicines to make you fall asleep. Moderate conscious sedation lets you return to your normal activities sooner. Tell a health care provider about: Any allergies you have. All medicines you are taking, including vitamins, herbs, steroids, eye drops, creams, and over-the-counter medicines. Any problems you or family members have had with anesthesia. Any bleeding problems you have. Any surgeries you have had. Any medical conditions you have. Whether you are pregnant or may be pregnant. Any recent alcohol, tobacco, or drug use. What are the risks? Your health care provider will talk with you about risks. These may include: Oversedation. This is when you get too much medicine. Nausea or vomiting. Allergic reaction to medicines. Trouble breathing. If this happens, a breathing tube may be used. It will be removed when you can breathe better on your own. Heart trouble. Lung trouble. Emergence delirium. This is when you feel confused while the sedation wears off. This gets better with time. What happens before the procedure? When to stop eating and drinking Follow instructions from your health care provider about what you may eat and drink. These may include: 8 hours before your procedure Stop eating most foods. Do not eat meat, fried foods, or fatty foods. Eat only light foods, such as toast or crackers. All liquids are okay except energy drinks and  alcohol. 6 hours before your procedure Stop eating. Drink only clear liquids, such as water, clear fruit juice, black coffee, plain tea, and sports drinks. Do not drink energy drinks or alcohol. 2 hours before your procedure Stop drinking all liquids. You may be allowed to take medicines with small sips of water. If you do not follow your health care provider's instructions, your procedure may be delayed or canceled. Medicines Ask your health care provider about: Changing or stopping your regular medicines. These include any diabetes medicines or blood thinners you take. Taking medicines such as aspirin and ibuprofen. These medicines can thin your blood. Do not take them unless your health care provider tells you to. Taking over-the-counter medicines, vitamins, herbs, and supplements. Tests and exams You may have an exam or testing. You may have a blood or urine sample taken. General instructions Do not use any products that contain nicotine or tobacco for at least 4 weeks before the procedure. These products include cigarettes, chewing tobacco, and vaping devices, such as e-cigarettes. If you need help quitting, ask your health care provider. If you will be going home right after the procedure, plan to have a responsible adult: Take you home from the hospital or clinic. You will not be allowed to drive. Care for you for the time you are told. What happens during the procedure?  You will be given the sedative. It may be given: As a pill you can take by mouth. It can also be put into the rectum. As a spray through the nose. As an injection into muscle. As an injection into a vein through an IV.  You may be given oxygen as needed. Your blood pressure, heart rate, breathing rate, and blood oxygen level will be monitored during the procedure. The medical or dental procedure will be done. The procedure may vary among health care providers and hospitals. What happens after the  procedure? Your blood pressure, heart rate, breathing rate, and blood oxygen level will be monitored until you leave the hospital or clinic. You will get fluids through an IV as needed. Do not drive or operate machinery until your health care provider says that it is safe. This information is not intended to replace advice given to you by your health care provider. Make sure you discuss any questions you have with your health care provider. Document Revised: 06/12/2022 Document Reviewed: 06/12/2022 Elsevier Patient Education  2024 [employer] GENERAL RISKS AND COMPLICATIONS  What are the risk, side effects and possible complications? Generally speaking, most procedures are safe.  However, with any procedure there are risks, side effects, and the possibility of complications.  The risks and complications are dependent upon the sites that are lesioned, or the type of nerve block to be performed.  The closer the procedure is to the spine, the more serious the risks are.  Great care is taken when placing the radio frequency needles, block needles or lesioning probes, but sometimes complications can occur. Infection: Any time there is an injection through the skin, there is a risk of infection.  This is why sterile conditions are used for these blocks.  There are four possible types of infection. Localized skin infection. Central Nervous System Infection-This can be in the form of Meningitis, which can be deadly. Epidural Infections-This can be in the form of an epidural abscess, which can cause pressure inside of the spine, causing compression of the spinal cord with subsequent paralysis. This would require an emergency surgery to decompress, and there are no guarantees that the patient would recover from the paralysis. Discitis-This is an infection of the intervertebral discs.  It occurs in about 1% of discography procedures.  It is difficult to treat and it may lead to surgery.        2. Pain: the  needles have to go through skin and soft tissues, will cause soreness.       3. Damage to internal structures:  The nerves to be lesioned may be near blood vessels or    other nerves which can be potentially damaged.       4. Bleeding: Bleeding is more common if the patient is taking blood thinners such as  aspirin, Coumadin, Ticiid, Plavix, etc., or if he/she have some genetic predisposition  such as hemophilia. Bleeding into the spinal canal can cause compression of the spinal  cord with subsequent paralysis.  This would require an emergency surgery to  decompress and there are no guarantees that the patient would recover from the  paralysis.       5. Pneumothorax:  Puncturing of a lung is a possibility, every time a needle is introduced in  the area of the chest or upper back.  Pneumothorax refers to free air around the  collapsed lung(s), inside of the thoracic cavity (chest cavity).  Another two possible  complications related to a similar event would include: Hemothorax and Chylothorax.   These are variations of the Pneumothorax, where instead of air around the collapsed  lung(s), you may have blood or chyle, respectively.       6. Spinal headaches: They may occur with any procedures in  the area of the spine.       7. Persistent CSF (Cerebro-Spinal Fluid) leakage: This is a rare problem, but may occur  with prolonged intrathecal or epidural catheters either due to the formation of a fistulous  track or a dural tear.       8. Nerve damage: By working so close to the spinal cord, there is always a possibility of  nerve damage, which could be as serious as a permanent spinal cord injury with  paralysis.       9. Death:  Although rare, severe deadly allergic reactions known as "Anaphylactic  reaction" can occur to any of the medications used.      10. Worsening of the symptoms:  We can always make thing worse.  What are the chances of something like this happening? Chances of any of this occuring are  extremely low.  By statistics, you have more of a chance of getting killed in a motor vehicle accident: while driving to the hospital than any of the above occurring .  Nevertheless, you should be aware that they are possibilities.  In general, it is similar to taking a shower.  Everybody knows that you can slip, hit your head and get killed.  Does that mean that you should not shower again?  Nevertheless always keep in mind that statistics do not mean anything if you happen to be on the wrong side of them.  Even if a procedure has a 1 (one) in a 1,000,000 (million) chance of going wrong, it you happen to be that one..Also, keep in mind that by statistics, you have more of a chance of having something go wrong when taking medications.  Who should not have this procedure? If you are on a blood thinning medication (e.g. Coumadin, Plavix, see list of "Blood Thinners"), or if you have an active infection going on, you should not have the procedure.  If you are taking any blood thinners, please inform your physician.  How should I prepare for this procedure? Do not eat or drink anything at least six hours prior to the procedure. Bring a driver with you .  It cannot be a taxi. Come accompanied by an adult that can drive you back, and that is strong enough to help you if your legs get weak or numb from the local anesthetic. Take all of your medicines the morning of the procedure with just enough water to swallow them. If you have diabetes, make sure that you are scheduled to have your procedure done first thing in the morning, whenever possible. If you have diabetes, take only half of your insulin dose and notify our nurse that you have done so as soon as you arrive at the clinic. If you are diabetic, but only take blood sugar pills (oral hypoglycemic), then do not take them on the morning of your procedure.  You may take them after you have had the procedure. Do not take aspirin or any aspirin-containing  medications, at least eleven (11) days prior to the procedure.  They may prolong bleeding. Wear loose fitting clothing that may be easy to take off and that you would not mind if it got stained with Betadine or blood. Do not wear any jewelry or perfume Remove any nail coloring.  It will interfere with some of our monitoring equipment.  NOTE: Remember that this is not meant to be interpreted as a complete list of all possible complications.  Unforeseen problems may occur.  BLOOD THINNERS  The following drugs contain aspirin or other products, which can cause increased bleeding during surgery and should not be taken for 2 weeks prior to and 1 week after surgery.  If you should need take something for relief of minor pain, you may take acetaminophen which is found in Tylenol,m Datril, Anacin-3 and Panadol. It is not blood thinner. The products listed below are.  Do not take any of the products listed below in addition to any listed on your instruction sheet.  A.P.C or A.P.C with Codeine Codeine Phosphate Capsules #3 Ibuprofen Ridaura  ABC compound Congesprin Imuran rimadil  Advil Cope Indocin Robaxisal  Alka-Seltzer Effervescent Pain Reliever and Antacid Coricidin or Coricidin-D  Indomethacin Rufen  Alka-Seltzer plus Cold Medicine Cosprin Ketoprofen S-A-C Tablets  Anacin Analgesic Tablets or Capsules Coumadin Korlgesic Salflex  Anacin Extra Strength Analgesic tablets or capsules CP-2 Tablets Lanoril Salicylate  Anaprox Cuprimine Capsules Levenox Salocol  Anexsia-D Dalteparin Magan Salsalate  Anodynos Darvon compound Magnesium Salicylate Sine-off  Ansaid Dasin Capsules Magsal Sodium Salicylate  Anturane Depen Capsules Marnal Soma  APF Arthritis pain formula Dewitt's Pills Measurin Stanback  Argesic Dia-Gesic Meclofenamic Sulfinpyrazone  Arthritis Bayer Timed Release Aspirin Diclofenac Meclomen Sulindac  Arthritis pain formula Anacin Dicumarol Medipren Supac  Analgesic (Safety coated) Arthralgen  Diffunasal Mefanamic Suprofen  Arthritis Strength Bufferin Dihydrocodeine Mepro Compound Suprol  Arthropan liquid Dopirydamole Methcarbomol with Aspirin Synalgos  ASA tablets/Enseals Disalcid Micrainin Tagament  Ascriptin Doan's Midol Talwin  Ascriptin A/D Dolene Mobidin Tanderil  Ascriptin Extra Strength Dolobid Moblgesic Ticlid  Ascriptin with Codeine Doloprin or Doloprin with Codeine Momentum Tolectin  Asperbuf Duoprin Mono-gesic Trendar  Aspergum Duradyne Motrin or Motrin IB Triminicin  Aspirin plain, buffered or enteric coated Durasal Myochrisine Trigesic  Aspirin Suppositories Easprin Nalfon Trillsate  Aspirin with Codeine Ecotrin Regular or Extra Strength Naprosyn Uracel  Atromid-S Efficin Naproxen Ursinus  Auranofin Capsules Elmiron Neocylate Vanquish  Axotal Emagrin Norgesic Verin  Azathioprine Empirin or Empirin with Codeine Normiflo Vitamin E  Azolid Emprazil Nuprin Voltaren  Bayer Aspirin plain, buffered or children's or timed BC Tablets or powders Encaprin Orgaran Warfarin Sodium  Buff-a-Comp Enoxaparin Orudis Zorpin  Buff-a-Comp with Codeine Equegesic Os-Cal-Gesic   Buffaprin Excedrin plain, buffered or Extra Strength Oxalid   Bufferin Arthritis Strength Feldene Oxphenbutazone   Bufferin plain or Extra Strength Feldene Capsules Oxycodone with Aspirin   Bufferin with Codeine Fenoprofen Fenoprofen Pabalate or Pabalate-SF   Buffets II Flogesic Panagesic   Buffinol plain or Extra Strength Florinal or Florinal with Codeine Panwarfarin   Buf-Tabs Flurbiprofen Penicillamine   Butalbital Compound Four-way cold tablets Penicillin   Butazolidin Fragmin Pepto-Bismol   Carbenicillin Geminisyn Percodan   Carna Arthritis Reliever Geopen Persantine   Carprofen Gold's salt Persistin   Chloramphenicol Goody's Phenylbutazone   Chloromycetin Haltrain Piroxlcam   Clmetidine heparin Plaquenil   Cllnoril Hyco-pap Ponstel   Clofibrate Hydroxy chloroquine Propoxyphen         Before  stopping any of these medications, be sure to consult the physician who ordered them.  Some, such as Coumadin (Warfarin) are ordered to prevent or treat serious conditions such as "deep thrombosis", "pumonary embolisms", and other heart problems.  The amount of time that you may need off of the medication may also vary with the medication and the reason for which you were taking it.  If you are taking any of these medications, please make sure you notify your pain physician before you undergo any procedures.         Spinal Cord Stimulation  Trial Information A spinal cord stimulation trial is a test to see whether a spinal cord stimulator reduces your pain. A spinal cord stimulator is a small device that is inserted (implanted) in your back. The stimulator has small wires (leads) that connect it to your spinal cord. The stimulator sends electrical pulses through the leads to the spinal cord. This can relieve pain. Settings for the stimulator can be adjusted with a remote device to find the best pain control. Your health care provider may suggest a spinal cord stimulation trial if other treatments for long-lasting (chronic) pain have not worked for you. Spinal cord stimulation may be used to manage pain that is caused by: Failed back surgery. Heart pain (angina) or a blood vessel problem (peripheral vascular disease). Pain after amputation (phantom limb sensation). Nerve-related pain. Complex regional pain syndrome. Painful inflammation of a thin membrane that covers the brain and spinal cord (arachnoiditis). Other syndromes that involve chronic pain or injuries related to the spinal cord. For the trial, the stimulator is attached to your back instead of inserted under the skin. A trial period is usually 3-5 days, but this can vary among health care providers. After your trial period, you and your health care provider will discuss whether a permanent spinal cord stimulator is an option for you. The  permanent stimulator may be an option depending on: Whether the stimulator reduces your pain during the trial. Whether the stimulator fits into your lifestyle. Whether the cost of the stimulator is covered by your insurance. What are the risks? Generally, a spinal cord stimulation trial is safe. However, problems can occur, including: Bleeding or pain at the insertion site of the leads. Infection at the insertion site or around the leads. Allergic reactions to medicines, devices, or dyes. Damage to the skin, nerves, back muscles, or spinal cord where the leads are placed. Inability to move the legs (paralysis). Numbness in the legs. Inability to control when you urinate or have a bowel movement (incontinence). Spinal fluid leakage. How is a spinal cord stimulator placed for a trial?  For a trial period, the stimulator generator and battery will be outside the body, typically on a belt that you will wear around your waist. Only the leads that connect the stimulator to the spinal cord are implanted under your skin. The exact location of the stimulator depends on where you have pain. There are two types of surgery for implanting the leads: Noninvasive surgery. In this type of surgery, a small incision is made and needles are used to place the leads under your skin. Open surgery. In this type of surgery, a larger incision is made, and the leads are implanted directly into your back. How to care for yourself during the trial period Incision care Check your incisions every day for signs of infection. Check for: Redness, swelling, or pain. Fluid or blood. Warmth. Pus or a bad smell. Activity Return to your normal activities as told by your health care provider. Ask your health care provider what activities are safe for you. You may have to avoid lifting. Ask your health care provider how much you can safely lift. General instructions Follow your health care provider's instructions about how  to take care of your spinal cord stimulator and your incision. Make sure you write down the following information so that you can share this information with your health care provider: Your responses to the stimulator. Describe these as told by your health care provider. Your pain level throughout the day.  The amount and kind of pain medicine that you take. Do not take baths, swim, or use a hot tub until your health care provider approves. Ask your health care provider if you may take showers. You may only be allowed to take sponge baths. Take over-the-counter and prescription medicines only as told by your health care provider. Tell all health care providers who provide care for you that you have a spinal cord stimulator. This is important information that could affect the medical treatment that you receive. Keep all follow-up visits. This is important. Contact a health care provider if: You have any of these signs of infection: Redness, swelling, or pain around your incision. Fluid or blood coming from your incision. Warmth coming from your incision. Pus or a bad smell coming from your incision. A fever. Get help right away if: Your pain gets worse. The stimulator leads come out. You develop numbness or weakness in your legs, or you have difficulty walking. You have problems urinating or having a bowel movement. You have symptoms that last for more than 2-3 days or your symptoms suddenly get worse. These symptoms may be an emergency. Get help right away. Call 911. Do not wait to see if the symptoms will go away. Do not drive yourself to the hospital. Summary A spinal cord stimulator is a small device that sends electrical pulses to your spinal cord. This can relieve pain caused by many different health conditions. Before a permanent stimulator is placed, you will have a trial using a temporary stimulator that is not implanted under your skin. This helps determine if a stimulator will  reduce your pain. For the trial, only the leads that connect the stimulator to the spinal cord are implanted under your skin. During the trial period, make sure you write down information about your pain and your responses to the stimulator so that you can share this information with your health care provider. Keep all follow-up visits. This is important. Contact your health care provider if you have problems during the trial. This information is not intended to replace advice given to you by your health care provider. Make sure you discuss any questions you have with your health care provider. Document Revised: 08/30/2021 Document Reviewed: 08/30/2021 Elsevier Patient Education  2024 [employer].

## 2023-05-14 NOTE — Progress Notes (Signed)
Safety precautions to be maintained throughout the outpatient stay will include: orient to surroundings, keep bed in low position, maintain call bell within reach at all times, provide assistance with transfer out of bed and ambulation.  

## 2023-05-18 ENCOUNTER — Ambulatory Visit
Admission: RE | Admit: 2023-05-18 | Discharge: 2023-05-18 | Disposition: A | Discharge status: Home / Self Care | Payer: Medicare PPO | Source: Ambulatory Visit | Attending: Neurosurgery | Admitting: Neurosurgery

## 2023-05-18 DIAGNOSIS — G894 Chronic pain syndrome: Secondary | ICD-10-CM

## 2023-06-18 ENCOUNTER — Ambulatory Visit
Admission: RE | Admit: 2023-06-18 | Discharge: 2023-06-18 | Disposition: A | Discharge status: Home / Self Care | Payer: Medicare PPO | Source: Ambulatory Visit | Attending: Student in an Organized Health Care Education/Training Program | Admitting: Student in an Organized Health Care Education/Training Program

## 2023-06-18 ENCOUNTER — Encounter: Payer: Self-pay | Admitting: Student in an Organized Health Care Education/Training Program

## 2023-06-18 ENCOUNTER — Ambulatory Visit
Payer: Medicare PPO | Attending: Student in an Organized Health Care Education/Training Program | Admitting: Student in an Organized Health Care Education/Training Program

## 2023-06-18 VITALS — BP 135/92 | HR 74 | Temp 97.2°F | Resp 14 | Ht 66.0 in | Wt 185.0 lb

## 2023-06-18 DIAGNOSIS — M961 Postlaminectomy syndrome, not elsewhere classified: Secondary | ICD-10-CM | POA: Insufficient documentation

## 2023-06-18 MED ORDER — CEFAZOLIN SODIUM 1 G IJ SOLR
INTRAMUSCULAR | Status: AC
  Filled 2023-06-18: qty 10

## 2023-06-18 MED ORDER — FENTANYL CITRATE (PF) 100 MCG/2ML IJ SOLN
INTRAMUSCULAR | Status: AC
  Filled 2023-06-18: qty 2

## 2023-06-18 MED ORDER — MIDAZOLAM HCL 5 MG/5ML IJ SOLN
INTRAMUSCULAR | Status: AC
  Filled 2023-06-18: qty 5

## 2023-06-18 MED ORDER — LIDOCAINE HCL 2 % IJ SOLN
INTRAMUSCULAR | Status: AC
  Filled 2023-06-18: qty 20

## 2023-06-18 MED ORDER — ROPIVACAINE HCL 2 MG/ML IJ SOLN
INTRAMUSCULAR | Status: AC
  Filled 2023-06-18: qty 20

## 2023-06-18 MED ORDER — LACTATED RINGERS IV SOLN: Freq: Once | INTRAVENOUS | Status: DC

## 2023-06-18 MED ORDER — CEPHALEXIN 500 MG PO CAPS
500.0000 mg | ORAL_CAPSULE | Freq: Four times a day (QID) | ORAL | 0 refills | Status: DC
Start: 2023-06-19 — End: 2023-06-26

## 2023-06-18 MED ORDER — CEFAZOLIN SODIUM-DEXTROSE 2-4 GM/100ML-% IV SOLN
2.0000 g | Freq: Once | INTRAVENOUS | Status: DC
  Administered 2023-06-18: 2 g via INTRAVENOUS
  Filled 2023-06-18: qty 100

## 2023-06-18 MED ORDER — MIDAZOLAM HCL 5 MG/5ML IJ SOLN
0.5000 mg | Freq: Once | INTRAMUSCULAR | Status: DC
  Administered 2023-06-18: 1.5 mg via INTRAVENOUS

## 2023-06-18 MED ORDER — FENTANYL CITRATE (PF) 100 MCG/2ML IJ SOLN
25.0000 ug | INTRAMUSCULAR | Status: DC | PRN
  Administered 2023-06-18: 50 ug via INTRAVENOUS

## 2023-06-18 MED ORDER — LIDOCAINE HCL 2 % IJ SOLN
20.0000 mL | Freq: Once | INTRAMUSCULAR | Status: DC
  Administered 2023-06-18: 400 mg

## 2023-06-18 NOTE — Patient Instructions (Signed)
Today we did the following -We have done a Spinal Cord Stimulator Trial with Medtronic  -As long as the leads are in place, do not bathe or shower. You may sponge bathe.  -While the lead is in place, please limit the bending, lifting, or twisting because the lead can move.  -The things we want to see is if your pain improves (and by what percentage), if you can do more activity (don't overdo it), and if you can use less of your "as needed" medicine. Do not stop long acting medicines like methadone, oxycontin, MS Contin, etc without checking with us.  -It is VERY important that you pick up the antibiotics we prescribed, Keflex, on your way home from the trial and take them as prescribed(4 times a day), starting today, for as long as the lead is in place.  -The Spina Cord Stimulator Representative will be in contact with you while the lead is in place to make sure the trial goes as well as possible.  -Please contact us with any questions or concerns at any time during the trial.   -If you start running a fever over 100 degrees, have severe back pain, or new pain running down the legs, or drainage coming from the lead site, contact us immediately and/or go to the emergency room.  -Please do not restart any sort of medication that can thin your blood such as Aspirin, ibuprofen, motrin, aleve, plavix, coumadin, etc. If you aren't sure, call and ask.  -We will have you return on Tues July 16th to have the lead removed. If this is successful, at that point we can go over the details about the permanent implant.   

## 2023-06-18 NOTE — Progress Notes (Signed)
Safety precautions to be maintained throughout the outpatient stay will include: orient to surroundings, keep bed in low position, maintain call bell within reach at all times, provide assistance with transfer out of bed and ambulation.  

## 2023-06-18 NOTE — Progress Notes (Signed)
PROVIDER NOTE: Interpretation of information contained herein should be left to medically-trained personnel. Specific [name] instructions are provided elsewhere under "[name] Instructions" section of medical record. This document was created in part using STT-dictation technology, any transcriptional errors that may result from this process are unintentional.  [name]: Rachel [name] Type: Established DOB: Jun 21, 1956 MRN: [id number] PCP: Dortha Kern, MD  Service: Procedure DOS: 06/18/2023 Setting: Ambulatory Location: Ambulatory outpatient facility Delivery: Face-to-face Provider: Edward Jolly, MD Specialty: Interventional Pain Management Specialty designation: 09 Location: Outpatient facility Ref. Prov.: Edward Jolly, MD       Interventional Therapy   Primary Reason for Admission: Surgical management of chronic pain condition.   Procedure:              Type: MEDTR[employer] Trial Spinal Cord Neurostimulator Implant (Percutaneous, interlaminar, posterior epidural placement) Laterality: Bilateral (-50)  Level: Lumbar  Imaging: Fluoroscopic guidance Anesthesia: Local anesthesia (1-2% Lidocaine) Sedation: Moderate Sedation  DOS: 06/18/2023  Performed by: Edward Jolly, MD  Purpose: Diagnostic. To determine if a permanent implant may be effective in controlling some or all of Rachel [name] chronic pain symptoms.   Rationale (medical necessity): procedure needed and proper for Rachel diagnosis and/or treatment of Rachel [name] medical symptoms and needs. 1. Lumbar post-laminectomy syndrome   2. Failed back surgical syndrome    NAS-11 Pain score:   Pre-procedure: 5 /10   Post-procedure: 5 /10     Target: Posterior epidural space over Rachel dorsal columns of Rachel spinal cord. Location: Posterior intraspinal canal Region: Thoracolumbar  Approach: Translaminar percutaneous  Type of procedure: Surgical   Position / Prep / Materials:  Position: Prone  Prep solution: DuraPrep (Iodine Povacrylex  [0.7% available iodine] and Isopropyl Alcohol, 74% w/w) Prep Area: Entire  Posterior  Thoracolumbar  Region  Materials:  Tray: Implant tray Needle(s):  Type: Epidural  Gauge (G):  14   Length: Regular (10cm)  Qty: 2  H&P (Pre-op Assessment):  Rachel [name] is a 67 y.o. (year old), female [name], seen today for interventional treatment. She  has a past surgical history that includes Partial hysterectomy; ERCP; Colonoscopy with propofol (N/A, 08/15/2016); Portacath placement (N/A, 08/18/2016); Colonoscopy with propofol (N/A, 11/08/2016); laparoscopy (N/A, 11/23/2016); Colostomy (N/A, 11/23/2016); Biopsy thyroid (Right, 11/16/2016); Abdominal perineal bowel resection (N/A, 03/16/2017); Appendectomy (03/16/2017); Thyroid lobectomy (Right, 11/05/2017); Incision and drainage perirectal abscess (N/A, 04/15/2018); Back surgery (2000); Abdominal hysterectomy (1999); Cholecystectomy (1983); Colonoscopy with propofol (N/A, 08/20/2020); Colon surgery; and Colonoscopy with propofol (N/A, 03/21/2023).  Initial Vital Signs:  Pulse/EKG Rate: 85ECG Heart Rate: 65 Temp: (!) 97.2 F (36.2 C) Resp: 18 BP: 131/85 SpO2: 95 %  BMI: Estimated body mass index is 29.86 kg/m as calculated from Rachel following:   Height as of this encounter: 5\' 6"  (1.676 m).   Weight as of this encounter: 185 lb (83.9 kg).  Risk Assessment: Allergies: Reviewed. She is allergic to prednisone.  Allergy Precautions: None required Coagulopathies: Reviewed. None identified.  Blood-thinner therapy: None at this time Active Infection(s): Reviewed. None identified. Rachel [name] is afebrile  Site Confirmation: Rachel [name] was asked to confirm Rachel procedure and laterality before marking Rachel site, which she did. Procedure checklist: Completed Consent: Before Rachel procedure and under Rachel influence of no sedative(s), amnesic(s), or anxiolytics, Rachel [name] was informed of Rachel treatment options, risks and possible complications. To fulfill our ethical  and legal obligations, as recommended by Rachel [employer]'s Code of Ethics, I have informed Rachel [name] of my clinical impression; Rachel nature and purpose of  Rachel treatment or procedure; Rachel risks, benefits, and possible complications of Rachel intervention; Rachel alternatives, including doing nothing; Rachel risk(s) and benefit(s) of Rachel alternative treatment(s) or procedure(s); and Rachel risk(s) and benefit(s) of doing nothing.  Rachel [name] was provided with information about Rachel general risks and possible complications associated with most interventional procedures. These include, but are not limited to: failure to achieve desired goals, infection, bleeding, organ or nerve damage, allergic reactions, paralysis, and/or death.  In addition, she was informed of those risks and possible complications associated to this particular procedure, which include, but are not limited to: damage to Rachel implant; failure to decrease pain; local, systemic, or serious CNS infections, intraspinal abscess with possible cord compression and paralysis, or life-threatening such as meningitis; intrathecal and/or epidural bleeding with formation of hematoma with possible spinal cord compression and permanent paralysis; organ damage; nerve injury or damage with subsequent sensory, motor, and/or autonomic system dysfunction, resulting in transient or permanent pain, numbness, and/or weakness of one or several areas of Rachel body; allergic reactions, either minor or major life-threatening, such as anaphylactic or anaphylactoid reactions.  Furthermore, Rachel [name] was informed of those risks and complications associated with Rachel medications. These include, but are not limited to: allergic reactions (i.e.: anaphylactic or anaphylactoid reactions); arrhythmia;  Hypotension/hypertension; cardiovascular collapse; respiratory depression and/or shortness of breath; swelling or edema; medication-induced neural toxicity; particulate matter  embolism and blood vessel occlusion with resultant organ, and/or nervous system infarction and permanent paralysis.  Finally, she was informed that Medicine is not an exact science; therefore, there is also Rachel possibility of unforeseen or unpredictable risks and/or possible complications that may result in a catastrophic outcome. Rachel [name] indicated having understood very clearly. We have given Rachel [name] no guarantees and we have made no promises. Enough time was given to Rachel [name] to ask questions, all of which were answered to Rachel [name]'s satisfaction. Rachel [name] has indicated that she wanted to continue with Rachel procedure. Attestation: I, Rachel ordering provider, attest that I have discussed with Rachel [name] Rachel benefits, risks, side-effects, alternatives, likelihood of achieving goals, and potential problems during recovery for Rachel procedure that I have provided informed consent. Date  Time: 06/18/2023  9:38 AM  Pre-Procedure Preparation:  Monitoring: As per clinic protocol. Respiration, ETCO2, SpO2, BP, heart rate and rhythm monitor placed and checked for adequate function Safety Precautions: [name] was assessed for positional comfort and pressure points before starting Rachel procedure. Time-out: I initiated and conducted Rachel "Time-out" before starting Rachel procedure, as per protocol. Rachel [name] was asked to participate by confirming Rachel accuracy of Rachel "Time Out" information. Verification of Rachel correct person, site, and procedure were performed and confirmed by me, Rachel nursing staff, and Rachel [name]. "Time-out" conducted as per Joint Commission's Universal Protocol ([id number].01.01.01). Time: 1046 Start Time: 1046 hrs.  Description/Narrative of Procedure:          Rationale (medical necessity): procedure needed and proper for Rachel diagnosis and/or treatment of Rachel [name]'s medical symptoms and needs. Procedural Technique Safety Precautions: Aspiration looking for blood return was conducted prior  to all injections. At no point did we inject any substances, as a needle was being advanced. No attempts were made at seeking any paresthesias. Safe injection practices and needle disposal techniques used. Medications properly checked for expiration dates. SDV (single dose vial) medications used. Description of Rachel Procedure: Protocol guidelines were followed. Rachel [name] was assisted into a comfortable position. Rachel target area was identified and Rachel area prepped in Rachel  usual manner. Skin & deeper tissues infiltrated with local anesthetic. Appropriate amount of time allowed to pass for local anesthetics to take effect. Rachel procedure needles were then advanced to Rachel target area. Proper needle placement secured. Negative aspiration confirmed. Solution injected in intermittent fashion, asking for systemic symptoms every 0.5cc of injectate. Rachel needles were then removed and Rachel area cleansed, making sure to leave some of Rachel prepping solution back to take advantage of its long term bactericidal properties.  Technical description of procedure: Availability of a responsible, adult driver, and NPO status confirmed. Informed consent was obtained after having discussed risks and possible complications. An IV was started. Rachel [name] was then taken to Rachel fluoroscopy suite, where Rachel [name] was placed in position for Rachel procedure, over Rachel fluoroscopy table. Rachel [name] was then monitored in Rachel usual manner. Fluoroscopy was manipulated to obtain Rachel best possible view of Rachel target. Parallex error was corrected before commencing Rachel procedure. Once a clear view of Rachel target had been obtained, Rachel skin and deeper tissues over Rachel procedure site were infiltrated using lidocaine, loaded in a 10 cc luer-loc syringe with a 0.5 inch, 25-G needle. Rachel introducer needle(s) was/were then inserted through Rachel skin and deeper tissues. A paramidline approach was used to enter Rachel posterior epidural space at a 30 angle, using  "Loss-of-resistance Technique" with 3 ml of PF-NaCl (0.9% NSS). Correct needle placement was confirmed in Rachel antero-posterior and lateral fluoroscopic views. Rachel lead was gently introduced and manipulated under real-time fluoroscopy, constantly assessing for pain, discomfort, or paresthesias, until Rachel tip rested at Rachel desired level. Both sides were done in identical fashion. Electrode placement was tested until appropriate coverage was attained. Once Rachel [name] confirmed that Rachel stimulation was over Rachel desired area, Rachel lead(s) was/were secured in place and Rachel introducer needles removed. This was done under real-time fluoroscopy while observing Rachel electrode tip to avoid unintended migration. Rachel area was covered with a non-occlusive dressing and Rachel [name] transported to recovery for further programming.  Vitals:   06/18/23 1115 06/18/23 1125 06/18/23 1135 06/18/23 1145  BP: (!) 137/95 (!) 141/79 138/77 (!) 135/92  Pulse: 74     Resp: 16 18 18 14   Temp:      TempSrc:      SpO2: 100% 95% 94% 95%  Weight:      Height:        Start Time: 1046 hrs. End Time: 1104 (procedure finished1104, 1104-1113 taping) hrs.  Neurostimulator Details:   Lead(s):  Brand: Medtronic         Epidural Access Level:  T12-L1 T12-L1  Lead implant:  Bilateral   No. of Electrodes/Lead:  8 8  Laterality:  Left Right  Top electrode location:  T7 (mid) T8  Bottom electrode location:  T9 (mid) T10  Model No.: [id number] Same  Length: 60cm Same  Lot No.: [id number]           MRI compatibility:  Yes Yes   Imaging Guidance (Spinal):          Type of Imaging Technique: Fluoroscopy Guidance (Spinal) Indication(s): Assistance in needle guidance and placement for procedures requiring needle placement in or near specific anatomical locations not easily accessible without such assistance. Exposure Time: Please see nurses notes. Contrast: None used. Fluoroscopic Guidance: I was personally present during Rachel use of  fluoroscopy. "Tunnel Vision Technique" used to obtain Rachel best possible view of Rachel target area. Parallax error corrected before commencing Rachel procedure. "Direction-depth-direction" technique used to introduce  Rachel needle under continuous pulsed fluoroscopy. Once target was reached, antero-posterior, oblique, and lateral fluoroscopic projection used confirm needle placement in all planes. Images permanently stored in EMR. Interpretation: No contrast injected. I personally interpreted Rachel imaging intraoperatively. Adequate needle placement confirmed in multiple planes. Permanent images saved into Rachel [name]'s record.     Antibiotic Prophylaxis:   Anti-infectives (From admission, onward)    Start     Dose/Rate Route Frequency Ordered Stop   06/19/23 0000  cephALEXin (KEFLEX) 500 MG capsule        500 mg Oral 4 times daily 06/18/23 0943 06/26/23 2359   06/18/23 1000  ceFAZolin (ANCEF) IVPB 2g/100 mL premix        2 g 200 mL/hr over 30 Minutes Intravenous  Once 06/18/23 0946        Indication(s): Implant Prophylaxis.  Post-operative Assessment:  Post-procedure Vital Signs:  Pulse/HCG Rate: 7465 Temp: (!) 97.2 F (36.2 C) Resp: 14 BP: (!) 135/92 SpO2: 95 %  Complications: No immediate post-treatment complications observed by team, or reported by [name].  Note: Rachel [name] tolerated Rachel entire procedure well. A repeat set of vitals were taken after Rachel procedure and Rachel [name] was kept under observation following institutional policy, for this type of procedure. Post-procedural neurological assessment was performed, showing return to baseline, prior to discharge. Rachel [name] was provided with post-procedure discharge instructions, including a section on how to identify potential problems. Should any problems arise concerning this procedure, Rachel [name] was given instructions to immediately contact us, at any time, without hesitation. In any case, we plan to contact Rachel [name] by  telephone for a follow-up status report regarding this interventional procedure.  Comments:  No additional relevant information.  Plan of Care  Orders:  Orders Placed This Encounter  Procedures   DG PAIN CLINIC C-ARM 1-60 MIN NO REPORT    Intraoperative interpretation by procedural physician at [facility].    Standing Status:   Standing    Number of Occurrences:   1    Order Specific Question:   Reason for exam:    Answer:   Assistance in needle guidance and placement for procedures requiring needle placement in or near specific anatomical locations not easily accessible without such assistance.     Medications administered: Nadiah Ismail. Monteiro had no medications administered during this visit.  See Rachel medical record for exact dosing, route, and time of administration.  Follow-up plan:   Return in about 8 days (around 06/26/2023) for SCS lead pull.       Recent Visits Date Type Provider Dept  05/14/23 Office Visit Edward Jolly, MD [facility]  Showing recent visits within past 90 days and meeting all other requirements Today's Visits Date Type Provider Dept  06/18/23 Procedure visit Edward Jolly, MD [facility]  Showing today's visits and meeting all other requirements Future Appointments Date Type Provider Dept  06/26/23 Appointment Edward Jolly, MD [facility]  Showing future appointments within next 90 days and meeting all other requirements  Disposition: Discharge home  Discharge (Date  Time): 06/18/2023; 1150 hrs.   Primary Care Physician: Dortha Kern, MD Location: [facility] Note by: Edward Jolly, MD (TTS technology used. I apologize for any typographical errors that were not detected and corrected.) Date: 06/18/2023; Time: 11:58 AM

## 2023-06-26 ENCOUNTER — Ambulatory Visit
Admission: RE | Admit: 2023-06-26 | Discharge: 2023-06-26 | Disposition: A | Discharge status: Home / Self Care | Payer: Medicare PPO | Source: Ambulatory Visit | Attending: Student in an Organized Health Care Education/Training Program | Admitting: Student in an Organized Health Care Education/Training Program

## 2023-06-26 ENCOUNTER — Encounter: Payer: Self-pay | Admitting: Student in an Organized Health Care Education/Training Program

## 2023-06-26 ENCOUNTER — Ambulatory Visit
Payer: Medicare PPO | Attending: Student in an Organized Health Care Education/Training Program | Admitting: Student in an Organized Health Care Education/Training Program

## 2023-06-26 VITALS — BP 128/88 | HR 76 | Temp 98.4°F | Resp 16 | Ht 66.0 in | Wt 188.0 lb

## 2023-06-26 DIAGNOSIS — M961 Postlaminectomy syndrome, not elsewhere classified: Secondary | ICD-10-CM

## 2023-06-26 DIAGNOSIS — G894 Chronic pain syndrome: Secondary | ICD-10-CM

## 2023-06-26 MED ORDER — HYDROCODONE-ACETAMINOPHEN 5-325 MG PO TABS: 1.0000 | ORAL_TABLET | Freq: Two times a day (BID) | ORAL | 0 refills | Status: AC | PRN

## 2023-06-26 MED ORDER — PREGABALIN 25 MG PO CAPS
ORAL_CAPSULE | ORAL | 0 refills | Status: DC
Start: 2023-06-26 — End: 2024-01-22

## 2023-06-26 NOTE — Progress Notes (Signed)
Patient: Rachel [name]  Service Category: E/M  Provider: Edward Jolly, MD  DOB: Apr 08, 1956  DOS: 06/26/2023  Referring Provider: Dortha Kern, MD  MRN: [id number]  Setting: Ambulatory outpatient  PCP: Dortha Kern, MD  Type: Established Patient  Specialty: Interventional Pain Management    Location: External  Delivery: Face-to-face  SCS TRIAL POST-OP EVALUATION and MM   Primary Reason(s) for Visit: Encounter for removal of temporary spinal cord stimulator lead(s) and evaluation of trial implant. CC: Back Pain (Lumbar right is worse )  HPI  Rachel [name] is a 67 y.o. year old, female patient, who comes today for a post-procedure evaluation. She has Former smoker; Essential hypertension; Chest pain with moderate risk for cardiac etiology; Rapid heart beat; Midsternal chest pain; Vertigo; Hypokalemia; Rectal mass; Coronary artery disease, non-occlusive; Aortic atherosclerosis (HCC); 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); Fever and chills; Failed back surgical syndrome; Pelvic abscess in female; Body mass index between 19-24, adult; Cardiovascular disease; Cerebrovascular accident [facility]); Lumbar post-laminectomy syndrome; and Lumbar radicular pain on their problem list. Her primarily concern today is the Back Pain (Lumbar right is worse )  Pain Assessment: Location: Lower, Right, Left Back Radiating: right leg to the toes and left leg to behind the knee.  legs are very weak this morning Onset: Other (comment) (SCS has quieted the pain.) Duration: Chronic pain Quality: Other (Comment) (no pain sensations at present.) Severity: 4 /10 (subjective, self-reported pain score)  Effect on ADL: patient reports that she was able to do more activity during the SCS trial Timing: Other (Comment) Modifying factors: SCS BP: 128/88  HR: 76  Rachel [name] comes in today, after a SCS (Spinal Cord Stimulator) Trial Implant on 06/18/2023, to have her percutaneous, temporary neurostimulator lead(s)  removed and to evaluate the trial experience to determine if a permanent implant may be effective in controlling some or all of her chronic pain symptoms.  Patient endorses 75% pain relief during her spinal cord stim trial.  She states that she was able to cook dinner and also walk around the grocery store without having as much pain and without having to sit and rest.  She would like to move forward with permanent implant.  Further details on both, my assessment(s), as well as the proposed treatment plan, please see below.  Post-operative Assessment  Intra-procedural problems/complications: None observed.         Reported side-effects: None.        Post-surgical adverse reactions or complications: None reported         Laboratory Chemistry Profile   Renal Lab Results  Component Value Date   BUN 12 08/29/2019   CREATININE 0.74 08/29/2019   BCR 16 03/08/2017   GFRAA >60 08/29/2019   GFRNONAA >60 08/29/2019   SPECGRAV 1.020 08/21/2018   PHUR 5.5 08/21/2018   PROTEINUR Negative 08/21/2018     Electrolytes Lab Results  Component Value Date   NA 138 08/29/2019   K 3.2 (L) 08/29/2019   CL 100 08/29/2019   CALCIUM 9.0 08/29/2019   MG 2.1 06/11/2019     Hepatic Lab Results  Component Value Date   AST 20 08/29/2019   ALT 14 08/29/2019   ALBUMIN 3.7 08/29/2019   ALKPHOS 131 (H) 08/29/2019   LIPASE 174 07/22/2014     ID Lab Results  Component Value Date   SARSCOV2NAA NEGATIVE 08/18/2020   STAPHAUREUS NEGATIVE 11/20/2016   MRSAPCR NEGATIVE 11/20/2016     Bone No results found for: "VD25OH", "  [id number]", "WN4627OJ5", "[id number]", "25OHVITD1", "25OHVITD2", "25OHVITD3", "TESTOFREE", "TESTOSTERONE"   Endocrine Lab Results  Component Value Date   GLUCOSE 97 08/29/2019   GLUCOSEU Negative 08/21/2018   HGBA1C 5.5 07/17/2016   TSH 0.22 (L) 07/23/2014     Neuropathy Lab Results  Component Value Date   HGBA1C 5.5 07/17/2016     CNS No results found for: "COLORCSF",  "APPEARCSF", "RBCCOUNTCSF", "WBCCSF", "POLYSCSF", "LYMPHSCSF", "EOSCSF", "PROTEINCSF", "GLUCCSF", "JCVIRUS", "CSFOLI", "IGGCSF", "LABACHR", "ACETBL"   Inflammation (CRP: Acute  ESR: Chronic) No results found for: "CRP", "ESRSEDRATE", "LATICACIDVEN"   Rheumatology No results found for: "RF", "ANA", "LABURIC", "URICUR", "LYMEIGGIGMAB", "LYMEABIGMQN", "HLAB27"   Coagulation Lab Results  Component Value Date   INR 0.94 07/17/2016   LABPROT 12.6 07/17/2016   APTT 27 07/17/2016   PLT 206 08/29/2019     Cardiovascular Lab Results  Component Value Date   BNP 1,509 (H) 07/22/2014   CKTOTAL 85 07/22/2014   CKMB 0.7 07/22/2014   TROPONINI 0.05 07/22/2014   HGB 12.4 08/29/2019   HCT 37.9 08/29/2019     Screening Lab Results  Component Value Date   SARSCOV2NAA NEGATIVE 08/18/2020   STAPHAUREUS NEGATIVE 11/20/2016   MRSAPCR NEGATIVE 11/20/2016     Cancer Lab Results  Component Value Date   CEA 2.8 04/06/2017     Allergens No results found for: "ALMOND", "APPLE", "ASPARAGUS", "AVOCADO", "BANANA", "BARLEY", "BASIL", "BAYLEAF", "GREENBEAN", "LIMABEAN", "WHITEBEAN", "BEEFIGE", "REDBEET", "BLUEBERRY", "BROCCOLI", "CABBAGE", "MELON", "CARROT", "CASEIN", "CASHEWNUT", "CAULIFLOWER", "CELERY"     Note: Lab results reviewed.  Recent Imaging Results  Results for orders placed during the hospital encounter of 08/18/16  DG C-Arm 1-60 Min-No Report  Narrative CLINICAL DATA: port-a cath insertion  C-ARM 1-60 MINUTES Fluoroscopy was utilized by the requesting physician.  No radiographic interpretation.  Interpretation Report: Fluoroscopy was used during the procedure to assist with needle guidance. The images were interpreted intraoperatively by the requesting physician.        Meds   Current Outpatient Medications:    acetaminophen (TYLENOL) 500 MG tablet, Take 500 mg by mouth every 6 (six) hours as needed., Disp: , Rfl:    amLODipine (NORVASC) 10 MG tablet, Take 10 mg by mouth  daily., Disp: , Rfl:    atorvastatin (LIPITOR) 20 MG tablet, Take 20 mg by mouth daily., Disp: , Rfl:    HYDROcodone-acetaminophen (NORCO/VICODIN) 5-325 MG tablet, Take 1 tablet by mouth every 12 (twelve) hours as needed for severe pain. Must last 30 days., Disp: 60 tablet, Rfl: 0   nitroGLYCERIN (NITROSTAT) 0.4 MG SL tablet, Place 1 tablet (0.4 mg total) under the tongue every 5 (five) minutes as needed for chest pain., Disp: 25 tablet, Rfl: 6   omeprazole (PRILOSEC) 20 MG capsule, Take 20 mg by mouth daily., Disp: , Rfl:    pregabalin (LYRICA) 25 MG capsule, Take 1 capsule (25 mg total) by mouth at bedtime for 15 days, THEN 2 capsules (50 mg total) at bedtime., Disp: 105 capsule, Rfl: 0   solifenacin (VESICARE) 5 MG tablet, Take 5 mg by mouth daily., Disp: , Rfl:    venlafaxine XR (EFFEXOR-XR) 150 MG 24 hr capsule, Take 150 mg by mouth daily with breakfast., Disp: , Rfl:   ROS  Constitutional: Denies any fever or chills Gastrointestinal: No reported hemesis, hematochezia, vomiting, or acute GI distress Musculoskeletal: Denies any acute onset joint swelling, redness, loss of ROM, or weakness Neurological: No reported episodes of acute onset apraxia, aphasia, dysarthria, agnosia, amnesia, paralysis, loss of coordination, or loss of consciousness  Allergies  Ms. Petrov is allergic to prednisone.  PFSH  Drug: Ms. Orengo  reports no history of drug use. Alcohol:  reports no history of alcohol use. Tobacco:  reports that she has been smoking cigarettes. She has never used smokeless tobacco. Medical:  has a past medical history of Anginal pain (HCC), Arthritis, Body mass index (BMI) of 24.0-24.9 in adult, Cardiovascular disease, Colostomy present (HCC), Current every day smoker, Headache, History of rectal cancer, History of UTI, Hypertension, Midsternal chest pain, Personal history of chemotherapy (2017), Personal history of radiation therapy (2017), Port-A-Cath in place, Rectal cancer (HCC) (2017),  Stroke [facility]), Syncope and collapse, and Thyroid nodule (2018). Surgical: Ms. Burruel  has a past surgical history that includes Partial hysterectomy; ERCP; Colonoscopy with propofol (N/A, 08/15/2016); Portacath placement (N/A, 08/18/2016); Colonoscopy with propofol (N/A, 11/08/2016); laparoscopy (N/A, 11/23/2016); Colostomy (N/A, 11/23/2016); Biopsy thyroid (Right, 11/16/2016); Abdominal perineal bowel resection (N/A, 03/16/2017); Appendectomy (03/16/2017); Thyroid lobectomy (Right, 11/05/2017); Incision and drainage perirectal abscess (N/A, 04/15/2018); Back surgery (2000); Abdominal hysterectomy (1999); Cholecystectomy (1983); Colonoscopy with propofol (N/A, 08/20/2020); Colon surgery; and Colonoscopy with propofol (N/A, 03/21/2023). Family: family history includes Breast cancer (age of onset: 91) in her mother; Heart disease in her paternal grandmother; Hyperlipidemia in her father; Prostate cancer in her brother; Stroke in her maternal grandfather.  Postop Exam  General appearance: Afebrile. Well nourished, well developed, and well hydrated. In no apparent acute distress. Vitals:   06/26/23 0852  BP: 128/88  Pulse: 76  Resp: 16  Temp: 98.4 F (36.9 C)  TempSrc: Temporal  SpO2: 96%  Weight: 188 lb (85.3 kg)  Height: 5\' 6"  (1.676 m)   BMI Assessment: Estimated body mass index is 30.34 kg/m as calculated from the following:   Height as of this encounter: 5\' 6"  (1.676 m).   Weight as of this encounter: 188 lb (85.3 kg).  Surgical site: Wound is healing well. No redness, tenderness, discharge, abnormal odors, or any other evidence of infection or complications. SCS trial leads removed under live fluoroscopy, tips intact   Assessment  Primary Diagnosis & Pertinent Problem List: The primary encounter diagnosis was Lumbar post-laminectomy syndrome. Diagnoses of Failed back surgical syndrome and Chronic pain syndrome were also pertinent to this visit.  Diagnosis Status  1. Lumbar  post-laminectomy syndrome   2. Failed back surgical syndrome   3. Chronic pain syndrome    Responding Responding Controlled   Plan of Care  Discussed implant options which could include a percutaneous implant versus a paddle implant which would require neurosurgery.  Patient states that she prefers percutaneous implant.  I informed her that percutaneous implant will be done with myself and Dr. Laban Emperor.  She is also interested in medication management at least until she is able to have her implant.  I will obtain baseline urine toxicology screen and have signed controlled substance agreement with patient.  This will be a Medtronic trial.   We had a lengthy and very detailed discussion of all the risks, benefits, alternatives, and rationale of surgery as well as the option of continuing nonsurgical therapies. We specifically discussed the risks of temporary or permanent worsened neurologic injury, no symptomatic relief or pain made worse after procedure, and also the need for future surgery (due to infection, CSF leak, bleeding, adjacent segment issues, bone-healing difficulties, and other related issues). No guarantees of outcome were made or implied and he is eager to proceed and presents for definitive treatment.  Patient told me that all of her questions were answered thoroughly and  to her satisfaction. Confidence and understanding of the discussed risks and consequences of  treatment was expressed and he accepted these risks and was eager to proceed with procedure.   Issues concerning treatment and diagnosis were discussed with the patient. There are no barriers to understanding the plan of treatment. Explanation was well received by patient and/or family who then verbalized understanding.     Orders:  Orders Placed This Encounter  Procedures   Spinal Cord Stimulator Placement    Standing Status:   Future    Standing Expiration Date:   09/26/2023    Scheduling Instructions:      Medtronic SCS implant with Dr. Conception Oms [facility] C-ARM 1-60 MIN NO REPORT    Intraoperative interpretation by procedural physician at [facility].    Standing Status:   Standing    Number of Occurrences:   1    Order Specific Question:   Reason for exam:    Answer:   Assistance in needle guidance and placement for procedures requiring needle placement in or near specific anatomical locations not easily accessible without such assistance.   Compliance Drug Analysis, Ur    Volume: 30 ml(s). Minimum 3 ml of urine is needed. Document temperature of fresh sample. Indications: Long term (current) use of opiate analgesic (N62.952) Test#: [phone] (Comprehensive Profile)    Order Specific Question:   Release to patient    Answer:   Immediate     Medications administered: Conswella K. Sarsfield had no medications administered during this visit.  See the medical record for exact dosing, route, and time of administration.  Follow-up plan:   Return in about 4 weeks (around 07/24/2023) for Medtronic SCS implant (with Dr Shireen Quan) , ECT.       Recent Visits Date Type Provider Dept  06/18/23 Procedure visit Edward Jolly, MD [facility]  05/14/23 Office Visit Edward Jolly, MD [facility]  Showing recent visits within past 90 days and meeting all other requirements Today's Visits Date Type Provider Dept  06/26/23 Procedure visit Edward Jolly, MD [facility]  Showing today's visits and meeting all other requirements Future Appointments No visits were found meeting these conditions. Showing future appointments within next 90 days and meeting all other requirements  Disposition: Discharge home  Discharge (Date  Time): 06/26/2023;   hrs.   Primary Care Physician: Dortha Kern, MD Location: [facility] Note by: Edward Jolly, MD (TTS technology used. I apologize for any typographical errors that were not detected and  corrected.) Date: 06/26/2023; Time: 1:16 PM

## 2023-06-26 NOTE — Progress Notes (Signed)
Safety precautions to be maintained throughout the outpatient stay will include: orient to surroundings, keep bed in low position, maintain call bell within reach at all times, provide assistance with transfer out of bed and ambulation.  

## 2023-06-29 LAB — COMPLIANCE DRUG ANALYSIS, UR

## 2023-08-14 ENCOUNTER — Encounter: Payer: Self-pay | Admitting: Student in an Organized Health Care Education/Training Program

## 2023-08-14 ENCOUNTER — Ambulatory Visit
Payer: Medicare PPO | Attending: Student in an Organized Health Care Education/Training Program | Admitting: Student in an Organized Health Care Education/Training Program

## 2023-08-14 VITALS — BP 119/78 | HR 75 | Temp 97.3°F | Resp 18 | Ht 66.0 in | Wt 185.0 lb

## 2023-08-14 DIAGNOSIS — G894 Chronic pain syndrome: Secondary | ICD-10-CM | POA: Insufficient documentation

## 2023-08-14 DIAGNOSIS — M961 Postlaminectomy syndrome, not elsewhere classified: Secondary | ICD-10-CM | POA: Insufficient documentation

## 2023-08-14 NOTE — Patient Instructions (Signed)
 ______________________________________________________________________    Preparing for your procedure  Appointments: If you think you may not be able to keep your appointment, call 24-48 hours in advance to cancel. We need time to make it available to others.  During your procedure appointment there will be: No Prescription Refills. No disability issues to discussed. No medication changes or discussions.  Instructions: Food intake: Avoid eating anything solid for at least 8 hours prior to your procedure. Clear liquid intake: You may take clear liquids such as water up to 2 hours prior to your procedure. (No carbonated drinks. No soda.) Transportation: Unless otherwise stated by your physician, bring a driver. (Driver cannot be a Market researcher, Pharmacist, community, or any other form of public transportation.) Morning Medicines: Except for blood thinners, take all of your other morning medications with a sip of water. Make sure to take your heart and blood pressure medicines. If your blood pressure's lower number is above 100, the case will be rescheduled. Blood thinners: Make sure to stop your blood thinners as instructed.  If you take a blood thinner, but were not instructed to stop it, call our office [phone] and ask to talk to a nurse. Not stopping a blood thinner prior to certain procedures could lead to serious complications. Diabetics on insulin: Notify the staff so that you can be scheduled 1st case in the morning. If your diabetes requires high dose insulin, take only  of your normal insulin dose the morning of the procedure and notify the staff that you have done so. Preventing infections: Shower with an antibacterial soap the morning of your procedure.  Build-up your immune system: Take 1000 mg of Vitamin C with every meal (3 times a day) the day prior to your procedure. Antibiotics: Inform the nursing staff if you are taking any antibiotics or if you have any conditions that may require antibiotics  prior to procedures. (Example: recent joint implants)   Pregnancy: If you are pregnant make sure to notify the nursing staff. Not doing so may result in injury to the fetus, including death.  Sickness: If you have a cold, fever, or any active infections, call and cancel or reschedule your procedure. Receiving steroids while having an infection may result in complications. Arrival: You must be in the facility at least 30 minutes prior to your scheduled procedure. Tardiness: Your scheduled time is also the cutoff time. If you do not arrive at least 15 minutes prior to your procedure, you will be rescheduled.  Children: Do not bring any children with you. Make arrangements to keep them home. Dress appropriately: There is always a possibility that your clothing may get soiled. Avoid long dresses. Valuables: Do not bring any jewelry or valuables.  Reasons to call and reschedule or cancel your procedure: (Following these recommendations will minimize the risk of a serious complication.) Surgeries: Avoid having procedures within 2 weeks of any surgery. (Avoid for 2 weeks before or after any surgery). Flu Shots: Avoid having procedures within 2 weeks of a flu shots or . (Avoid for 2 weeks before or after immunizations). Barium: Avoid having a procedure within 7-10 days after having had a radiological study involving the use of radiological contrast. (Myelograms, Barium swallow or enema study). Heart attacks: Avoid any elective procedures or surgeries for the initial 6 months after a "Myocardial Infarction" (Heart Attack). Blood thinners: It is imperative that you stop these medications before procedures. Let us know if you if you take any blood thinner.  Infection: Avoid procedures during or within  two weeks of an infection (including chest colds or gastrointestinal problems). Symptoms associated with infections include: Localized redness, fever, chills, night sweats or profuse sweating, burning sensation  when voiding, cough, congestion, stuffiness, runny nose, sore throat, diarrhea, nausea, vomiting, cold or Flu symptoms, recent or current infections. It is specially important if the infection is over the area that we intend to treat. Heart and lung problems: Symptoms that may suggest an active cardiopulmonary problem include: cough, chest pain, breathing difficulties or shortness of breath, dizziness, ankle swelling, uncontrolled high or unusually low blood pressure, and/or palpitations. If you are experiencing any of these symptoms, cancel your procedure and contact your primary care physician for an evaluation.  Remember:  Regular Business hours are:  Monday to Thursday 8:00 AM to 4:00 PM  Provider's Schedule: Delano Metz, MD:  Procedure days: Tuesday and Thursday 7:30 AM to 4:00 PM  Edward Jolly, MD:  Procedure days: Monday and Wednesday 7:30 AM to 4:00 PM Last  Updated: 07/31/2023 ______________________________________________________________________

## 2023-08-14 NOTE — Progress Notes (Signed)
PROVIDER NOTE: Information contained herein reflects review and annotations entered in association with encounter. Interpretation of such information and data should be left to medically-trained personnel. Information provided to patient can be located elsewhere in the medical record under "Patient Instructions". Document created using STT-dictation technology, any transcriptional errors that may result from process are unintentional.    Patient: Rachel [name]  Service Category: E/M  Provider: Edward Jolly, MD  DOB: [birth year]  DOS: 08/14/2023  Referring Provider: Dortha Kern, MD  MRN: [id number]  Specialty: Interventional Pain Management  PCP: Dortha Kern, MD  Type: Established Patient  Setting: Ambulatory outpatient    Location: Office  Delivery: Face-to-face     HPI  Rachel [name], a 67 y.o. year old female, is here today because of her Lumbar post-laminectomy syndrome [M96.1]. Rachel [name] primary complain today is Back Pain and Leg Pain   Pain Assessment: Severity of Chronic pain is reported as a 5 /10. Location: Back Lower, Right, Left/right leg to the toes and left leg to behind the knee. Onset: More than a month ago. Quality: Constant, Crushing, Heaviness. Timing: Constant. Modifying factor(s): Sitting down and SCS trial had relief. Vitals:  height is 5\' 6"  (1.676 m) and weight is 185 lb (83.9 kg). Her temporal temperature is 97.3 F (36.3 C) (abnormal). Her blood pressure is 119/78 and her pulse is 75. Her respiration is 18 and oxygen saturation is 96%.  BMI: Estimated body mass index is 29.86 kg/m as calculated from the following:   Height as of this encounter: 5\' 6"  (1.676 m).   Weight as of this encounter: 185 lb (83.9 kg). Last encounter: 05/14/2023. Last procedure: 06/26/2023.  Reason for encounter:   Patient presents today for her preop assessment prior to her spinal cord stimulator implant scheduled for August 28, 2023.  She has no questions or concerns regarding this  procedure.  She is pleased with her results from her spinal cord stim trial and is looking forward to the implant.   Medication Review  HYDROcodone-acetaminophen, acetaminophen, amLODipine, atorvastatin, nitroGLYCERIN, omeprazole, pregabalin, solifenacin, and venlafaxine XR  History Review  Allergy: Rachel [name] is allergic to prednisone. Drug: Rachel [name]  reports no history of drug use. Alcohol:  reports no history of alcohol use. Tobacco:  reports that she has been smoking cigarettes. She has never used smokeless tobacco. Social: Rachel [name]  reports that she has been smoking cigarettes. She has never used smokeless tobacco. She reports that she does not drink alcohol and does not use drugs. Medical:  has a past medical history of Anginal pain (HCC), Arthritis, Body mass index (BMI) of 24.0-24.9 in adult, Cardiovascular disease, Colostomy present (HCC), Current every day smoker, Headache, History of rectal cancer, History of UTI, Hypertension, Midsternal chest pain, Personal history of chemotherapy (2017), Personal history of radiation therapy (2017), Port-A-Cath in place, Rectal cancer (HCC) (2017), Stroke [facility]), Syncope and collapse, and Thyroid nodule (2018). Surgical: Rachel [name]  has a past surgical history that includes Partial hysterectomy; ERCP; Colonoscopy with propofol (N/A, 08/15/2016); Portacath placement (N/A, 08/18/2016); Colonoscopy with propofol (N/A, 11/08/2016); laparoscopy (N/A, 11/23/2016); Colostomy (N/A, 11/23/2016); Biopsy thyroid (Right, 11/16/2016); Abdominal perineal bowel resection (N/A, 03/16/2017); Appendectomy (03/16/2017); Thyroid lobectomy (Right, 11/05/2017); Incision and drainage perirectal abscess (N/A, 04/15/2018); Back surgery (2000); Abdominal hysterectomy (1999); Cholecystectomy (1983); Colonoscopy with propofol (N/A, 08/20/2020); Colon surgery; and Colonoscopy with propofol (N/A, 03/21/2023). Family: family history includes Breast cancer (age of onset: 57) in her  mother; Heart disease in her paternal grandmother;  Hyperlipidemia in her father; Prostate cancer in her brother; Stroke in her maternal grandfather.  Laboratory Chemistry Profile   Renal Lab Results  Component Value Date   BUN 12 08/29/2019   CREATININE 0.74 08/29/2019   BCR 16 03/08/2017   GFRAA >60 08/29/2019   GFRNONAA >60 08/29/2019    Hepatic Lab Results  Component Value Date   AST 20 08/29/2019   ALT 14 08/29/2019   ALBUMIN 3.7 08/29/2019   ALKPHOS 131 (H) 08/29/2019   LIPASE 174 07/22/2014    Electrolytes Lab Results  Component Value Date   NA 138 08/29/2019   K 3.2 (L) 08/29/2019   CL 100 08/29/2019   CALCIUM 9.0 08/29/2019   MG 2.1 06/11/2019    Bone No results found for: "VD25OH", "VD125OH2TOT", "VP7106YI9", "SW5462VO3", "25OHVITD1", "25OHVITD2", "25OHVITD3", "TESTOFREE", "TESTOSTERONE"  Inflammation (CRP: Acute Phase) (ESR: Chronic Phase) No results found for: "CRP", "ESRSEDRATE", "LATICACIDVEN"       Note: Above Lab results reviewed.  Physical Exam  General appearance: Well nourished, well developed, and well hydrated. In no apparent acute distress Mental status: Alert, oriented x 3 (person, place, & time)       Cardiac: RRR, no chest pain Respiratory: No evidence of acute respiratory distress Eyes: PERLA MP: II Vitals: BP 119/78   Pulse 75   Temp (!) 97.3 F (36.3 C) (Temporal)   Resp 18   Ht 5\' 6"  (1.676 m)   Wt 185 lb (83.9 kg)   SpO2 96%   BMI 29.86 kg/m  BMI: Estimated body mass index is 29.86 kg/m as calculated from the following:   Height as of this encounter: 5\' 6"  (1.676 m).   Weight as of this encounter: 185 lb (83.9 kg). Ideal: Ideal body weight: 59.3 kg (130 lb 11.7 oz) Adjusted ideal body weight: 69.1 kg (152 lb 7 oz)  Assessment   Diagnosis  1. Lumbar post-laminectomy syndrome   2. Failed back surgical syndrome   3. Chronic pain syndrome      Updated Problems: No problems updated.  Plan of Care  Follow-up as scheduled  for spinal cord stimulator implant with myself and my colleague, Dr. Laban Emperor.  Patient does have antimicrobial soap solution that she knows to cleanse her back off with a knife before her procedure.  She does not have any questions or concerns.  She is looking forward to her implant  Follow-up plan:   No follow-ups on file.      Recent Visits Date Type Provider Dept  06/26/23 Procedure visit Edward Jolly, MD [facility]  06/18/23 Procedure visit Edward Jolly, MD [facility]  Showing recent visits within past 90 days and meeting all other requirements Today's Visits Date Type Provider Dept  08/14/23 Office Visit Edward Jolly, MD [facility]  Showing today's visits and meeting all other requirements Future Appointments Date Type Provider Dept  09/06/23 Appointment Edward Jolly, MD [facility]  Showing future appointments within next 90 days and meeting all other requirements  I discussed the assessment and treatment plan with the patient. The patient was provided an opportunity to ask questions and all were answered. The patient agreed with the plan and demonstrated an understanding of the instructions.  Patient advised to call back or seek an in-person evaluation if the symptoms or condition worsens.  Duration of encounter: .  Total time on encounter, as per AMA guidelines included both the face-to-face and non-face-to-face time personally spent by the physician and/or other qualified health care professional(s)  on the day of the encounter (includes time in activities that require the physician or other qualified health care professional and does not include time in activities normally performed by clinical staff). Physician's time may include the following activities when performed: Preparing to see the patient (e.g., pre-charting review of records, searching for previously ordered imaging, lab work, and nerve conduction tests) Review  of prior analgesic pharmacotherapies. Reviewing PMP Interpreting ordered tests (e.g., lab work, imaging, nerve conduction tests) Performing post-procedure evaluations, including interpretation of diagnostic procedures Obtaining and/or reviewing separately obtained history Performing a medically appropriate examination and/or evaluation Counseling and educating the patient/family/caregiver Ordering medications, tests, or procedures Referring and communicating with other health care professionals (when not separately reported) Documenting clinical information in the electronic or other health record Independently interpreting results (not separately reported) and communicating results to the patient/ family/caregiver Care coordination (not separately reported)  Note by: Edward Jolly, MD Date: 08/14/2023; Time: 3:16 PM

## 2023-08-14 NOTE — Progress Notes (Signed)
Safety precautions to be maintained throughout the outpatient stay will include: orient to surroundings, keep bed in low position, maintain call bell within reach at all times, provide assistance with transfer out of bed and ambulation.  

## 2023-08-20 ENCOUNTER — Encounter
Admission: RE | Admit: 2023-08-20 | Discharge: 2023-08-20 | Disposition: A | Discharge status: Home / Self Care | Payer: Medicare PPO | Source: Ambulatory Visit | Attending: Pain Medicine | Admitting: Pain Medicine

## 2023-08-20 ENCOUNTER — Other Ambulatory Visit: Payer: Self-pay

## 2023-08-20 VITALS — BP 116/73 | HR 73 | Resp 16 | Ht 66.0 in | Wt 185.0 lb

## 2023-08-20 DIAGNOSIS — Z01818 Encounter for other preprocedural examination: Secondary | ICD-10-CM | POA: Insufficient documentation

## 2023-08-20 DIAGNOSIS — I2510 Atherosclerotic heart disease of native coronary artery without angina pectoris: Secondary | ICD-10-CM | POA: Diagnosis not present

## 2023-08-20 DIAGNOSIS — R001 Bradycardia, unspecified: Secondary | ICD-10-CM | POA: Diagnosis not present

## 2023-08-20 DIAGNOSIS — I10 Essential (primary) hypertension: Secondary | ICD-10-CM | POA: Diagnosis not present

## 2023-08-20 DIAGNOSIS — E876 Hypokalemia: Secondary | ICD-10-CM | POA: Insufficient documentation

## 2023-08-20 DIAGNOSIS — Z01810 Encounter for preprocedural cardiovascular examination: Secondary | ICD-10-CM

## 2023-08-20 HISTORY — DX: Depression, unspecified: F32.A

## 2023-08-20 HISTORY — DX: Gastro-esophageal reflux disease without esophagitis: K21.9

## 2023-08-20 LAB — CBC
HCT: 41.5 % (ref 36.0–46.0)
Hemoglobin: 13.5 g/dL (ref 12.0–15.0)
MCH: 28.8 pg (ref 26.0–34.0)
MCHC: 32.5 g/dL (ref 30.0–36.0)
MCV: 88.7 fL (ref 80.0–100.0)
Platelets: 224 10*3/uL (ref 150–400)
RBC: 4.68 MIL/uL (ref 3.87–5.11)
RDW: 14.7 % (ref 11.5–15.5)
WBC: 9.2 10*3/uL (ref 4.0–10.5)
nRBC: 0 % (ref 0.0–0.2)

## 2023-08-20 LAB — BASIC METABOLIC PANEL
Anion gap: 12 (ref 5–15)
BUN: 12 mg/dL (ref 8–23)
CO2: 30 mmol/L (ref 22–32)
Calcium: 9.2 mg/dL (ref 8.9–10.3)
Chloride: 99 mmol/L (ref 98–111)
Creatinine, Ser: 0.68 mg/dL (ref 0.44–1.00)
GFR, Estimated: >60 mL/min (ref >60)
Glucose, Bld: 77 mg/dL (ref 70–99)
Potassium: 3.7 mmol/L (ref 3.5–5.1)
Sodium: 141 mmol/L (ref 135–145)

## 2023-08-20 LAB — SURGICAL PCR SCREEN
MRSA, PCR: NEGATIVE
Staphylococcus aureus: NEGATIVE

## 2023-08-20 NOTE — Patient Instructions (Signed)
Your procedure is scheduled on: Tuesday 08/28/23 To find out your arrival time, please call [phone] between 1PM - 3PM on:   Monday 08/27/23 Report to the Registration Desk on the 1st floor of the [facility]. Free Valet parking is available.  If your arrival time is 6:00 am, do not arrive before that time as the [facility] entrance doors do not open until 6:00 am.  REMEMBER: Instructions that are not followed completely may result in serious medical risk, up to and including death; or upon the discretion of your surgeon and anesthesiologist your surgery may need to be rescheduled.  Do not eat food after midnight the night before surgery.  No gum chewing or hard candies.  You may however, drink CLEAR liquids up to 2 hours before you are scheduled to arrive for your surgery. Do not drink anything within 2 hours of your scheduled arrival time.  Clear liquids include: - water  - apple juice without pulp - gatorade (not RED colors) - black coffee or tea (Do NOT add milk or creamers to the coffee or tea) Do NOT drink anything that is not on this list.  Type 1 and Type 2 diabetics should only drink water.  One week prior to surgery: Stop Anti-inflammatories (NSAIDS) such as Advil, Aleve, Ibuprofen, Motrin, Naproxen, Naprosyn and Aspirin based products such as Excedrin, Goody's Powder, BC Powder. You may however, continue to take Tylenol if needed for pain up until the day of surgery.  Stop ANY OVER THE COUNTER supplements until after surgery.  Continue taking all prescribed medications.   TAKE ONLY THESE MEDICATIONS THE MORNING OF SURGERY WITH A SIP OF WATER:  amLODipine (NORVASC) 10 MG tablet  atorvastatin (LIPITOR) 20 MG tablet  omeprazole (PRILOSEC) 20 MG capsule Antacid (take one the night before and one on the morning of surgery - helps to prevent nausea after surgery.) solifenacin (VESICARE) 5 MG tablet  venlafaxine (EFFEXOR) 75 MG tablet  May take Hydrocodone if  needed  No Alcohol for 24 hours before or after surgery.  No Smoking including e-cigarettes for 24 hours before surgery.  No chewable tobacco products for at least 6 hours before surgery.  No nicotine patches on the day of surgery.  Do not use any "recreational" drugs for at least a week (preferably 2 weeks) before your surgery.  Please be advised that the combination of cocaine and anesthesia may have negative outcomes, up to and including death. If you test positive for cocaine, your surgery will be cancelled.  On the morning of surgery brush your teeth with toothpaste and water, you may rinse your mouth with mouthwash if you wish. Do not swallow any toothpaste or mouthwash.  Use CHG Soap or wipes as directed on instruction sheet.  Do not wear lotions, powders, or perfumes.   Do not shave body hair from the neck down 48 hours before surgery.  Wear comfortable clothing (specific to your surgery type) to the hospital.  Do not wear jewelry, make-up, hairpins, clips or nail polish.  Contact lenses, hearing aids and dentures may not be worn into surgery.  Do not bring valuables to the hospital. [facility] is not responsible for any missing/lost belongings or valuables.   Notify your doctor if there is any change in your medical condition (cold, fever, infection).  If you are being discharged the day of surgery, you will not be allowed to drive home. You will need a responsible individual to drive you home and stay with you for  24 hours after surgery.   If you are taking public transportation, you will need to have a responsible individual with you.  If you are being admitted to the hospital overnight, leave your suitcase in the car. After surgery it may be brought to your room.  In case of increased patient census, it may be necessary for you, the patient, to continue your postoperative care in the Same Day Surgery department.  After surgery, you can help prevent lung  complications by doing breathing exercises.  Take deep breaths and cough every 1-2 hours. Your doctor may order a device called an Incentive Spirometer to help you take deep breaths. When coughing or sneezing, hold a pillow firmly against your incision with both hands. This is called "splinting." Doing this helps protect your incision. It also decreases belly discomfort.  Surgery Visitation Policy:  Patients undergoing a surgery or procedure may have two family members or support persons with them as long as the person is not COVID-19 positive or experiencing its symptoms.   Inpatient Visitation:    Visiting hours are 7 a.m. to 8 p.m. Up to four visitors are allowed at one time in a patient room. The visitors may rotate out with other people during the day. One designated support person (adult) may remain overnight.  Due to an increase in RSV and influenza rates and associated hospitalizations, children ages 65 and under will not be able to visit patients in [facility]. Masks continue to be strongly recommended.  Please call the Pre-admissions Testing Dept. at [phone] if you have any questions about these instructions.     Preparing for Surgery with CHLORHEXIDINE GLUCONATE (CHG) Soap  Chlorhexidine Gluconate (CHG) Soap  o An antiseptic cleaner that kills germs and bonds with the skin to continue killing germs even after washing  o Used for showering the night before surgery and morning of surgery  Before surgery, you can play an important role by reducing the number of germs on your skin.  CHG (Chlorhexidine gluconate) soap is an antiseptic cleanser which kills germs and bonds with the skin to continue killing germs even after washing.  Please do not use if you have an allergy to CHG or antibacterial soaps. If your skin becomes reddened/irritated stop using the CHG.  1. Shower the NIGHT BEFORE SURGERY and the MORNING OF SURGERY with CHG soap.  2. If you choose to  wash your hair, wash your hair first as usual with your normal shampoo.  3. After shampooing, rinse your hair and body thoroughly to remove the shampoo.  4. Use CHG as you would any other liquid soap. You can apply CHG directly to the skin and wash gently with a scrungie or a clean washcloth.  5. Apply the CHG soap to your body only from the neck down. Do not use on open wounds or open sores. Avoid contact with your eyes, ears, mouth, and genitals (private parts). Wash face and genitals (private parts) with your normal soap.  6. Wash thoroughly, paying special attention to the area where your surgery will be performed.  7. Thoroughly rinse your body with warm water.  8. Do not shower/wash with your normal soap after using and rinsing off the CHG soap.  9. Pat yourself dry with a clean towel.  10. Wear clean pajamas to bed the night before surgery.  12. Place clean sheets on your bed the night of your first shower and do not sleep with pets.  13. Shower again with  the CHG soap on the day of surgery prior to arriving at the hospital.  14. Do not apply any deodorants/lotions/powders.  15. Please wear clean clothes to the hospital.

## 2023-08-27 ENCOUNTER — Other Ambulatory Visit: Payer: Self-pay | Admitting: Pain Medicine

## 2023-08-27 DIAGNOSIS — R937 Abnormal findings on diagnostic imaging of other parts of musculoskeletal system: Secondary | ICD-10-CM | POA: Diagnosis present

## 2023-08-28 ENCOUNTER — Other Ambulatory Visit: Payer: Self-pay

## 2023-08-28 ENCOUNTER — Ambulatory Visit: Payer: Medicare PPO | Admitting: Anesthesiology

## 2023-08-28 ENCOUNTER — Ambulatory Visit
Admission: RE | Admit: 2023-08-28 | Discharge: 2023-08-28 | Disposition: A | Discharge status: Home / Self Care | Payer: Medicare PPO | Source: Ambulatory Visit | Attending: Pain Medicine | Admitting: Pain Medicine

## 2023-08-28 ENCOUNTER — Ambulatory Visit: Payer: Medicare PPO

## 2023-08-28 ENCOUNTER — Encounter
Admission: RE | Disposition: A | Discharge status: Home / Self Care | Payer: Self-pay | Source: Ambulatory Visit | Attending: Pain Medicine

## 2023-08-28 ENCOUNTER — Encounter: Payer: Self-pay | Admitting: Pain Medicine

## 2023-08-28 DIAGNOSIS — G8929 Other chronic pain: Secondary | ICD-10-CM | POA: Diagnosis present

## 2023-08-28 DIAGNOSIS — K8020 Calculus of gallbladder without cholecystitis without obstruction: Secondary | ICD-10-CM | POA: Diagnosis not present

## 2023-08-28 DIAGNOSIS — M48061 Spinal stenosis, lumbar region without neurogenic claudication: Secondary | ICD-10-CM | POA: Insufficient documentation

## 2023-08-28 DIAGNOSIS — M961 Postlaminectomy syndrome, not elsewhere classified: Secondary | ICD-10-CM | POA: Insufficient documentation

## 2023-08-28 DIAGNOSIS — I110 Hypertensive heart disease with heart failure: Secondary | ICD-10-CM | POA: Insufficient documentation

## 2023-08-28 DIAGNOSIS — Z888 Allergy status to other drugs, medicaments and biological substances status: Secondary | ICD-10-CM | POA: Diagnosis not present

## 2023-08-28 DIAGNOSIS — M5416 Radiculopathy, lumbar region: Secondary | ICD-10-CM | POA: Diagnosis present

## 2023-08-28 DIAGNOSIS — G894 Chronic pain syndrome: Secondary | ICD-10-CM | POA: Diagnosis present

## 2023-08-28 DIAGNOSIS — M5137 Other intervertebral disc degeneration, lumbosacral region: Secondary | ICD-10-CM | POA: Diagnosis present

## 2023-08-28 DIAGNOSIS — M5127 Other intervertebral disc displacement, lumbosacral region: Secondary | ICD-10-CM | POA: Diagnosis present

## 2023-08-28 DIAGNOSIS — M5117 Intervertebral disc disorders with radiculopathy, lumbosacral region: Secondary | ICD-10-CM | POA: Diagnosis not present

## 2023-08-28 DIAGNOSIS — F17210 Nicotine dependence, cigarettes, uncomplicated: Secondary | ICD-10-CM | POA: Insufficient documentation

## 2023-08-28 DIAGNOSIS — I700 Atherosclerosis of aorta: Secondary | ICD-10-CM | POA: Insufficient documentation

## 2023-08-28 DIAGNOSIS — M5116 Intervertebral disc disorders with radiculopathy, lumbar region: Secondary | ICD-10-CM | POA: Insufficient documentation

## 2023-08-28 DIAGNOSIS — Z8673 Personal history of transient ischemic attack (TIA), and cerebral infarction without residual deficits: Secondary | ICD-10-CM | POA: Diagnosis not present

## 2023-08-28 DIAGNOSIS — M4186 Other forms of scoliosis, lumbar region: Secondary | ICD-10-CM | POA: Diagnosis present

## 2023-08-28 DIAGNOSIS — C20 Malignant neoplasm of rectum: Secondary | ICD-10-CM | POA: Diagnosis not present

## 2023-08-28 DIAGNOSIS — K219 Gastro-esophageal reflux disease without esophagitis: Secondary | ICD-10-CM | POA: Diagnosis not present

## 2023-08-28 DIAGNOSIS — C775 Secondary and unspecified malignant neoplasm of intrapelvic lymph nodes: Secondary | ICD-10-CM | POA: Insufficient documentation

## 2023-08-28 DIAGNOSIS — M5126 Other intervertebral disc displacement, lumbar region: Secondary | ICD-10-CM | POA: Diagnosis present

## 2023-08-28 DIAGNOSIS — M51379 Other intervertebral disc degeneration, lumbosacral region without mention of lumbar back pain or lower extremity pain: Secondary | ICD-10-CM | POA: Diagnosis present

## 2023-08-28 DIAGNOSIS — E876 Hypokalemia: Secondary | ICD-10-CM | POA: Diagnosis not present

## 2023-08-28 DIAGNOSIS — I509 Heart failure, unspecified: Secondary | ICD-10-CM | POA: Insufficient documentation

## 2023-08-28 DIAGNOSIS — R937 Abnormal findings on diagnostic imaging of other parts of musculoskeletal system: Secondary | ICD-10-CM | POA: Diagnosis present

## 2023-08-28 DIAGNOSIS — I2510 Atherosclerotic heart disease of native coronary artery without angina pectoris: Secondary | ICD-10-CM | POA: Insufficient documentation

## 2023-08-28 HISTORY — PX: PULSE GENERATOR IMPLANT: SHX5370

## 2023-08-28 SURGERY — BILATERAL PULSE GENERATOR IMPLANT
Anesthesia: General | Site: Spine Lumbar

## 2023-08-28 MED ORDER — PROPOFOL 500 MG/50ML IV EMUL
INTRAVENOUS | Status: DC | PRN
  Administered 2023-08-28: 30 mg via INTRAVENOUS
  Administered 2023-08-28: 15 ug/kg/min via INTRAVENOUS

## 2023-08-28 MED ORDER — LACTATED RINGERS IV SOLN: INTRAVENOUS | Status: DC

## 2023-08-28 MED ORDER — BACITRACIN ZINC 500 UNIT/GM EX OINT
TOPICAL_OINTMENT | CUTANEOUS | Status: AC
  Filled 2023-08-28: qty 28.35

## 2023-08-28 MED ORDER — FENTANYL CITRATE (PF) 100 MCG/2ML IJ SOLN
INTRAMUSCULAR | Status: DC | PRN
  Administered 2023-08-28: 50 ug via INTRAVENOUS
  Administered 2023-08-28 (×2): 25 ug via INTRAVENOUS

## 2023-08-28 MED ORDER — MIDAZOLAM HCL 2 MG/2ML IJ SOLN
INTRAMUSCULAR | Status: DC | PRN
  Administered 2023-08-28 (×2): 1 mg via INTRAVENOUS

## 2023-08-28 MED ORDER — HYDROCODONE-ACETAMINOPHEN 5-325 MG PO TABS: 1.0000 | ORAL_TABLET | Freq: Four times a day (QID) | ORAL | Status: DC | PRN

## 2023-08-28 MED ORDER — LIDOCAINE HCL (PF) 1 % IJ SOLN
INTRAMUSCULAR | Status: AC
  Filled 2023-08-28: qty 30

## 2023-08-28 MED ORDER — ACETAMINOPHEN 10 MG/ML IV SOLN: 1000.0000 mg | Freq: Once | INTRAVENOUS | Status: DC | PRN

## 2023-08-28 MED ORDER — CHLORHEXIDINE GLUCONATE 0.12 % MT SOLN
OROMUCOSAL | Status: AC
  Filled 2023-08-28: qty 15

## 2023-08-28 MED ORDER — BUPIVACAINE HCL (PF) 0.5 % IJ SOLN
INTRAMUSCULAR | Status: AC
  Filled 2023-08-28: qty 30

## 2023-08-28 MED ORDER — FENTANYL CITRATE (PF) 100 MCG/2ML IJ SOLN: 25.0000 ug | INTRAMUSCULAR | Status: DC | PRN

## 2023-08-28 MED ORDER — LACTATED RINGERS IV SOLN: INTRAVENOUS | Status: DC | PRN

## 2023-08-28 MED ORDER — CEFAZOLIN SODIUM-DEXTROSE 2-4 GM/100ML-% IV SOLN
2.0000 g | INTRAVENOUS | Status: AC
  Administered 2023-08-28: 1 g via INTRAVENOUS

## 2023-08-28 MED ORDER — CEFAZOLIN SODIUM-DEXTROSE 1-4 GM/50ML-% IV SOLN: 1.0000 g | INTRAVENOUS | Status: DC

## 2023-08-28 MED ORDER — HYDROCODONE-ACETAMINOPHEN 5-325 MG PO TABS
1.0000 | ORAL_TABLET | Freq: Four times a day (QID) | ORAL | 0 refills | Status: AC | PRN
Start: 2023-08-28 — End: 2023-09-04

## 2023-08-28 MED ORDER — OXYCODONE HCL 5 MG PO TABS: 5.0000 mg | ORAL_TABLET | Freq: Once | ORAL | Status: DC | PRN

## 2023-08-28 MED ORDER — ONDANSETRON HCL 4 MG/2ML IJ SOLN: 4.0000 mg | Freq: Once | INTRAMUSCULAR | Status: DC | PRN

## 2023-08-28 MED ORDER — CEPHALEXIN 500 MG PO CAPS: 500.0000 mg | ORAL_CAPSULE | Freq: Four times a day (QID) | ORAL | 0 refills | Status: AC

## 2023-08-28 MED ORDER — OXYCODONE HCL 5 MG/5ML PO SOLN: 5.0000 mg | Freq: Once | ORAL | Status: DC | PRN

## 2023-08-28 MED ORDER — DEXMEDETOMIDINE HCL IN NACL 200 MCG/50ML IV SOLN
INTRAVENOUS | Status: DC | PRN
Start: 2023-08-28 — End: 2023-08-28
  Administered 2023-08-28 (×2): 8 ug via INTRAVENOUS

## 2023-08-28 MED ORDER — VANCOMYCIN HCL 1000 MG IV SOLR
INTRAVENOUS | Status: AC
  Filled 2023-08-28: qty 20

## 2023-08-28 MED ORDER — FENTANYL CITRATE (PF) 100 MCG/2ML IJ SOLN
INTRAMUSCULAR | Status: AC
  Filled 2023-08-28: qty 2

## 2023-08-28 MED ORDER — CEFAZOLIN SODIUM-DEXTROSE 1-4 GM/50ML-% IV SOLN
INTRAVENOUS | Status: AC
  Filled 2023-08-28: qty 50

## 2023-08-28 MED ORDER — CEPHALEXIN 500 MG PO CAPS: 500.0000 mg | ORAL_CAPSULE | Freq: Three times a day (TID) | ORAL | Status: DC

## 2023-08-28 MED ORDER — ORAL CARE MOUTH RINSE: 15.0000 mL | Freq: Once | OROMUCOSAL | Status: AC

## 2023-08-28 MED ORDER — MIDAZOLAM HCL 2 MG/2ML IJ SOLN
INTRAMUSCULAR | Status: AC
  Filled 2023-08-28: qty 2

## 2023-08-28 MED ORDER — CHLORHEXIDINE GLUCONATE 0.12 % MT SOLN
15.0000 mL | Freq: Once | OROMUCOSAL | Status: AC
  Administered 2023-08-28: 15 mL via OROMUCOSAL

## 2023-08-28 MED ORDER — ACETAMINOPHEN 500 MG PO TABS: 1000.0000 mg | ORAL_TABLET | Freq: Four times a day (QID) | ORAL | Status: DC

## 2023-08-28 MED ORDER — HYDROGEN PEROXIDE 3 % EX SOLN
CUTANEOUS | Status: DC | PRN
  Administered 2023-08-28: 1

## 2023-08-28 MED ORDER — ONDANSETRON HCL 4 MG/2ML IJ SOLN
INTRAMUSCULAR | Status: DC | PRN
  Administered 2023-08-28: 4 mg via INTRAVENOUS

## 2023-08-28 MED ORDER — 0.9 % SODIUM CHLORIDE (POUR BTL) OPTIME
TOPICAL | Status: DC | PRN
  Administered 2023-08-28: 500 mL

## 2023-08-28 MED ORDER — LIDOCAINE HCL (PF) 1 % IJ SOLN
INTRAMUSCULAR | Status: DC | PRN
  Administered 2023-08-28: 60 mL via INTRAMUSCULAR

## 2023-08-28 MED ORDER — CEFAZOLIN SODIUM-DEXTROSE 1-4 GM/50ML-% IV SOLN: 1.0000 g | Freq: Three times a day (TID) | INTRAVENOUS | Status: DC

## 2023-08-28 SURGICAL SUPPLY — 59 items
BINDER ABDOMINAL 12 ML 46-62 (SOFTGOODS) IMPLANT
BLADE SURG 15 STRL LF DISP TIS (BLADE) ×2 IMPLANT
BLADE SURG 15 STRL SS (BLADE) ×1
CHARGING BELT IMPLANT
CNTNR URN SCR LID CUP LEK RST (MISCELLANEOUS) ×2 IMPLANT
COMMUNICATOR IMPLANT
COMMUNICATOR CASE IMPLANT
CONT SPEC 4OZ STRL OR WHT (MISCELLANEOUS) ×1
CONTROLLER HANDSET COMM KIT (NEUROSURGERY SUPPLIES) IMPLANT
DRAPE C-ARM XRAY 36X54 (DRAPES) ×2 IMPLANT
DRAPE CAMERA CLOSED 9X96 (DRAPES) ×2 IMPLANT
DRAPE INCISE IOBAN 66X45 STRL (DRAPES) ×2 IMPLANT
DRSG TEGADERM 4X4.75 (GAUZE/BANDAGES/DRESSINGS) ×4 IMPLANT
DRSG TELFA 3X8 NADH STRL (GAUZE/BANDAGES/DRESSINGS) ×4 IMPLANT
DURAPREP 26ML APPLICATOR (WOUND CARE) ×2 IMPLANT
ELECT REM PT RETURN 9FT ADLT (ELECTROSURGICAL) ×1
ELECTRODE REM PT RTRN 9FT ADLT (ELECTROSURGICAL) ×2 IMPLANT
ENVELOPE ABSORB ANTIBACTERIAL (Mesh General) ×1 IMPLANT
GAUZE 4X4 16PLY ~~LOC~~+RFID DBL (SPONGE) ×2 IMPLANT
GLOVE BIO SURGEON STRL SZ8 (GLOVE) ×2 IMPLANT
GLOVE SURG XRAY 8.5 LX (GLOVE) ×2 IMPLANT
GOWN STRL REUS W/ TWL LRG LVL3 (GOWN DISPOSABLE) ×2 IMPLANT
GOWN STRL REUS W/ TWL XL LVL3 (GOWN DISPOSABLE) ×2 IMPLANT
GOWN STRL REUS W/TWL LRG LVL3 (GOWN DISPOSABLE) ×1
GOWN STRL REUS W/TWL XL LVL3 (GOWN DISPOSABLE) ×1
KIT LEAD (Lead) IMPLANT
KIT TURNOVER KIT A (KITS) ×2 IMPLANT
LABEL OR SOLS (LABEL) ×2 IMPLANT
MANIFOLD NEPTUNE II (INSTRUMENTS) ×2 IMPLANT
NDL HYPO 25X1 1.5 SAFETY (NEEDLE) ×2 IMPLANT
NDL SPNL 22GX3.5 QUINCKE BK (NEEDLE) ×2 IMPLANT
NEEDLE HYPO 25X1 1.5 SAFETY (NEEDLE) ×1
NEEDLE SPNL 22GX3.5 QUINCKE BK (NEEDLE) ×1
NEUROSTIM BELT FIXATION MED (NEUROSURGERY SUPPLIES) IMPLANT
NEUROSTIM INCEPTIV (Neuro Prosthesis/Implant) IMPLANT
NEUROSTIMULATOR INCEPTIV (Neuro Prosthesis/Implant) ×1 IMPLANT
PACK BASIN MINOR ARMC (MISCELLANEOUS) ×2 IMPLANT
PACK UNIVERSAL (MISCELLANEOUS) ×2 IMPLANT
POUCH TYRX ANTIBAC NEURO MED (Mesh General) IMPLANT
PROGRAMMER AND COMM CASE (NEUROSURGERY SUPPLIES) IMPLANT
RECHARGER KIT IMPLANT
RECHARGER SYSTEM (NEUROSURGERY SUPPLIES) IMPLANT
SLEEVE PROTECTION STRL DISP (MISCELLANEOUS) IMPLANT
SOL PREP PVP 2OZ (MISCELLANEOUS) ×1
SOLUTION PREP PVP 2OZ (MISCELLANEOUS) ×2 IMPLANT
SPIKE FLUID TRANSFER (MISCELLANEOUS) ×6 IMPLANT
STAPLER SKIN PROX 35W (STAPLE) ×2 IMPLANT
STIMULATOR WIRELESS EXTERNAL (NEUROSURGERY SUPPLIES) IMPLANT
SUT SILK 0 SH 30 (SUTURE) ×2 IMPLANT
SUT SILK 2 0 SH (SUTURE) ×2 IMPLANT
SUT VIC AB 2-0 SH 27 (SUTURE) ×3
SUT VIC AB 2-0 SH 27XBRD (SUTURE) ×4 IMPLANT
SWABSTK COMLB BENZOIN TINCTURE (MISCELLANEOUS) ×2 IMPLANT
SYR 10ML LL (SYRINGE) ×4 IMPLANT
SYR EPIDURAL 5ML GLASS (SYRINGE) ×2 IMPLANT
TRAP FLUID SMOKE EVACUATOR (MISCELLANEOUS) ×2 IMPLANT
WATER STERILE IRR 1000ML POUR (IV SOLUTION) ×2 IMPLANT
WATER STERILE IRR 500ML POUR (IV SOLUTION) ×2 IMPLANT
Wireless External Neurostimulator IMPLANT

## 2023-08-28 NOTE — Anesthesia Preprocedure Evaluation (Addendum)
Anesthesia Evaluation  Patient identified by MRN, date of birth, ID band Patient awake    Reviewed: Allergy & Precautions, NPO status , Patient's Chart, lab work & pertinent test results  History of Anesthesia Complications Negative for: history of anesthetic complications  Airway Mallampati: II   Neck ROM: Full    Dental  (+) Upper Dentures, Lower Dentures   Pulmonary Current Smoker (1/2 ppd) and Patient abstained from smoking.   Pulmonary exam normal breath sounds clear to auscultation       Cardiovascular hypertension, +CHF  Normal cardiovascular exam Rhythm:Regular Rate:Normal  ECG 08/20/23:  Sinus bradycardia Low voltage QRS ST & T wave abnormality, consider lateral ischemia   Neuro/Psych  Headaches PSYCHIATRIC DISORDERS  Depression    Chronic pain CVA (2017; no residual deficits)    GI/Hepatic ,GERD  Medicated and Controlled,,Rectal CA   Endo/Other  negative endocrine ROS    Renal/GU negative Renal ROS     Musculoskeletal  (+) Arthritis ,    Abdominal   Peds  Hematology negative hematology ROS (+)   Anesthesia Other Findings   Reproductive/Obstetrics                             Anesthesia Physical Anesthesia Plan  ASA: 3  Anesthesia Plan: General   Post-op Pain Management:    Induction: Intravenous  PONV Risk Score and Plan: 2 and Propofol infusion, TIVA and Treatment may vary due to age or medical condition  Airway Management Planned: Natural Airway  Additional Equipment:   Intra-op Plan:   Post-operative Plan:   Informed Consent: I have reviewed the patients History and Physical, chart, labs and discussed the procedure including the risks, benefits and alternatives for the proposed anesthesia with the patient or authorized representative who has indicated his/her understanding and acceptance.       Plan Discussed with: CRNA  Anesthesia Plan Comments:  (LMA/GETA backup discussed.  Patient consented for risks of anesthesia including but not limited to:  - adverse reactions to medications - damage to eyes, teeth, lips or other oral mucosa - nerve damage due to positioning  - sore throat or hoarseness - damage to heart, brain, nerves, lungs, other parts of body or loss of life  Informed patient about role of CRNA in peri- and intra-operative care.  Patient voiced understanding.)        Anesthesia Quick Evaluation

## 2023-08-28 NOTE — Brief Op Note (Signed)
Patient: Rachel [name]  (67 y.o. female)  Date: 08/28/2023  Time: 12:39 PM  Diagnosis Pre-op: BACKPAIN Post-op: s/p BACKPAIN Procedure: SPINAL CORD STIMULATOR PERMANANT IMPLANT: [phone] (CPT) Operating Room: [facility] OR ROOM 03  Surgeon: Delano Metz, MD   Assistant(s): None  Anesthesia: General  Anesthesia Staff:  Anesthesiologist: Reed Breech, MD CRNA: Merlene Pulling, CRNA EBL:  Minimal  Blood Administered: None Drains: None Meds ordered this encounter  Medications   lactated ringers infusion   OR Linked Order Group    chlorhexidine (PERIDEX) 0.12 % solution 15 mL    Oral care mouth rinse   DISCONTD: ceFAZolin (ANCEF) IVPB 1 g/50 mL premix    Order Specific Question:   Antibiotic Indication:    Answer:   Surgical Prophylaxis   DISCONTD: ceFAZolin (ANCEF) IVPB 1 g/50 mL premix    Order Specific Question:   Indication:    Answer:   Surgical Prophylaxis   ceFAZolin (ANCEF) IVPB 2g/100 mL premix    Order Specific Question:   Indication:    Answer:   Surgical Prophylaxis   bupivacaine (MARCAINE) 30 mL, lidocaine (PF) (XYLOCAINE) 1 % 30 mL   0.9 % irrigation (POUR BTL)   hydrogen peroxide 3 % external solution   Specimen & disposition: * No specimens in log * Complete Count: YES Tourniquet: * No tourniquets in log *  Dictation: Note written in EPIC Plan of Care: Discharge to 01-Home or Self Care after PACU  Patient Disposition: F/U at Pain Clinic as outpatient. Implants     Lead   Kit Lead - [id number] - Implanted   (Right) Spine Lumbar    Inventory item: KIT LEAD Model/Cat number: [id number]   Manufacturer: [employer] NEUR[employer]OD PAIN MGMT Lot number: [id number]   Area Of Implantation: Spine Lumbar      As of 08/28/2023     Status: Implanted               Kit Lead - Sna - Implanted   (Left) Spine Lumbar    Inventory item: KIT LEAD Model/Cat number: [id number]   Serial number: NA Manufacturer: [employer] NEUR[employer] PAIN MGMT   Lot number: [id number] Area Of  Implantation: Spine Lumbar    As of 08/28/2023     Status: Implanted               Catheter   Power [place] - [id number] - Implanted   (Right) Subclavian    Inventory item: KIT PORT POWER 8FR ISP CVUE Model/Cat number: [id number]   Manufacturer: [employer] Area Of Implantation: Subclavian    As of 08/18/2016     Status: Implanted               Mesh General   Mesh Bio-A 8x8 Syn Matr - [id number] - Implanted   (Left) Abdomen    Inventory item: MESH [employer] MATR Model/Cat number: [id number]   Serial number: [id number] Manufacturer: [employer]    As of 11/23/2016     Status: Implanted               Envelope Absorb Antibacterial - Sna - Implanted   (Right) Spine Lumbar    Inventory item: ENVELOPE ABSORB ANTIBACTERIAL Model/Cat number: [id number]   Serial number: NA Manufacturer: [employer] NEUR[employer]OD PAIN MGMT   Lot number: [id number]      As of 08/28/2023     Status: Implanted               Type Not Specified  Inceptiv - Implanted   (Right) Spine Lumbar    Model/Cat number: [id number] Serial number: [id number]   Manufacturer: [employer] [place] [employer] Lot number: [id number]    As of 08/28/2023     Status: Implanted

## 2023-08-28 NOTE — Transfer of Care (Signed)
Immediate Anesthesia Transfer of Care Note  Patient: RUQAYYAH CORBIT  Procedure(s) Performed: SPINAL CORD STIMULATOR PERMANANT IMPLANT (Spine Lumbar)  Patient Location: PACU  Anesthesia Type:MAC  Level of Consciousness: drowsy  Airway & Oxygen Therapy: Patient Spontanous Breathing  Post-op Assessment: Report given to RN and Post -op Vital signs reviewed and stable  Post vital signs: Reviewed  Last Vitals:  Vitals Value Taken Time  BP 109/67 08/28/23 1245  Temp 78F   Pulse 60 08/28/23 1248  Resp 20 08/28/23 1247  SpO2 93 % 08/28/23 1248  Vitals shown include unfiled device data.  Last Pain:  Vitals:   08/28/23 0913  TempSrc: Temporal  PainSc: 5          Complications: No notable events documented.

## 2023-08-28 NOTE — Discharge Instructions (Signed)
___________________________________________________________________________________________  PATIENT DISCHARGE INSTRUCTIONS FOLLOWING IMPLANTATION OF SPINAL CORD STIMULATOR GENERATOR  You will be discharged from the hospital on the same day as your surgery, after you are fully recovered. Make arrangements to be driven home. DO NOT DRIVE YOURSELF!!   The bandage over the side incision may drain a small amount of blood. This is normal; don?t be alarmed.  Be certain to review all warnings/precautions in the patient brochure accompanying your stimulator.  Please call your referring physician to make an appointment for follow?up care.  Instructions:  Food intake: Start with clear liquids (like water) and advance to regular food, as tolerated.   Physical activities:   Minimize bending, twisting, or awkward positions of the mid? and lower?back.   This is critical to prevent migration of leads, which will change stimulation patterns completely and require further surgery to correct.  Avoid lifting more than: 10 Lbs. for 6 weeks.  Wear the abdominal binder at all times for the next 6 weeks, except when bathing.  After 6 weeks you can gradually resume activities, but continue to avoid strenuous and awkward positions of the back.  Driving: You can start to drive again when you are fully recovered from the anesthesia side?effects (1?2 days).  Blood thinner: Restart your blood thinner 6 hours after your procedure. (Only for those taking blood thinners)  Insulin: As soon as you can eat, you may resume your normal dosing schedule. (Only for those taking insulin)  Wound dressing care: Keep procedure site clean and dry. Clean wound with alcohol, twice a day for the first 7 days, starting 48 hours after the surgery.   Keep incisions clean and dry. Cover with plastic wrap and tape when showering. Do not submerge incisions in a bathtub, pool or spa.  Follow-up appointment: Keep your follow-up  appointment after the procedure. Usually 6-7 days to remove the staples.  Expect:  From numbing medicine (AKA: Local Anesthetics): Numbness or decrease in pain.  Duration: It will depend on the type of local anesthetic used. On the average, 1 to 8 hours.   From procedure: Some discomfort is to be expected once the numbing medicine wears off. This should be minimal. Use your regular pain medicines.  Call if:  You experience numbness and weakness that gets worse with time, as opposed to wearing off.  New onset bowel or bladder incontinence. (Spinal procedures only)  have a temperature over 101.5?  are unable to urinate  have increasing difficulty walking or using your legs  have incisional pain that continues to increase rather than stabilize or improve  have any drainage of pus from your incisions  have drainage which completely saturates the bandage  Emergency Numbers:  Durning business hours (Monday - Thursday, 8:00 AM - 4:00 PM) (Friday, 9:00 AM - 12:00 Noon): [phone]  After hours: [phone] ____________________________________________________________________________________________

## 2023-08-28 NOTE — H&P (Addendum)
PROVIDER NOTE: Interpretation of information contained herein should be left to medically-trained personnel. Specific patient instructions are provided elsewhere under "Patient Instructions" section of medical record. This document was created in part using STT-dictation technology, any transcriptional errors that may result from this process are unintentional.  Patient: Rachel [name] Type: Established DOB: August 07, 1956 MRN: [id number] PCP: Dortha Kern, MD  Service: Surgical intervention DOS: 08/28/2023 Setting: Ambulatory outpatient Location: Ambulatory surgical facility Delivery: Face-to-face Provider: Oswaldo Done, MD Specialty: Interventional Pain Management Specialty designation: 09 Ref. Prov.: Delano Metz, MD    Reason for Admission: Pre-operative evaluation before Ambulatory Surgery for management of Chronic Pain Syndrome (Level of risk: Moderate) CC: No chief complaint on file.   HPI  Rachel [name] is a 67 y.o. year old, female patient, admited today for same day, chronic pain management surgical intervention. She has Former smoker; Essential hypertension; Chest pain with moderate risk for cardiac etiology; Rapid heart beat; Midsternal chest pain; Vertigo; Hypokalemia; Rectal mass; Coronary artery disease, non-occlusive; Aortic atherosclerosis (HCC); 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); Fever and chills; Failed back surgical syndrome; Pelvic abscess in female; Body mass index between 19-24, adult; Cardiovascular disease; Cerebrovascular accident [facility]); Lumbar post-laminectomy syndrome; Lumbar radicular pain; Calculus of gallbladder; Headache; Abnormal MRI, lumbar spine (03/06/2023); Dextroscoliosis of lumbar spine; DDD (degenerative disc disease), lumbosacral; Lumbar intervertebral foraminal disc herniation (Left: L4-5); Lumbosacral intervertebral foraminal disc herniation/extrusion (Right: L5-S1); Lumbar nerve root impingement (Left: L4) (Right: L5); and  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  Rachel [name] was last seen on Visit date not found. He is being admitted to [facility] for pain management same day surgery procedure.  The purpose of the admission has already been discussed with the patient, including the risks and possible complications.  Laboratory Chemistry Profile   Renal Lab Results  Component Value Date   BUN 12 08/20/2023   CREATININE 0.68 08/20/2023   BCR 16 03/08/2017   GFRAA >60 08/29/2019   GFRNONAA >60 08/20/2023   SPECGRAV 1.020 08/21/2018   PHUR 5.5 08/21/2018   PROTEINUR Negative 08/21/2018     Electrolytes Lab Results  Component Value Date   NA 141 08/20/2023   K 3.7 08/20/2023   CL 99 08/20/2023   CALCIUM 9.2 08/20/2023   MG 2.1 06/11/2019     Hepatic Lab Results  Component Value Date   AST 20 08/29/2019   ALT 14 08/29/2019   ALBUMIN 3.7 08/29/2019   ALKPHOS 131 (H) 08/29/2019   LIPASE 174 07/22/2014     ID Lab Results  Component Value Date   SARSCOV2NAA NEGATIVE 08/18/2020   STAPHAUREUS NEGATIVE 08/20/2023   MRSAPCR NEGATIVE 08/20/2023     Bone No results found for: "VD25OH", "VD125OH2TOT", "NA3557DU2", "GU5427CW2", "25OHVITD1", "25OHVITD2", "25OHVITD3", "TESTOFREE", "TESTOSTERONE"   Endocrine Lab Results  Component Value Date   GLUCOSE 77 08/20/2023   GLUCOSEU Negative 08/21/2018   HGBA1C 5.5 07/17/2016   TSH 0.22 (L) 07/23/2014     Neuropathy Lab Results  Component Value Date   HGBA1C 5.5 07/17/2016     CNS No results found for: "COLORCSF", "APPEARCSF", "RBCCOUNTCSF", "WBCCSF", "POLYSCSF", "LYMPHSCSF", "EOSCSF", "PROTEINCSF", "GLUCCSF", "JCVIRUS", "CSFOLI", "IGGCSF", "LABACHR", "ACETBL"   Inflammation (CRP: Acute  ESR: Chronic) No results found for: "CRP", "ESRSEDRATE", "LATICACIDVEN"   Rheumatology No results found for: "RF", "ANA", "LABURIC", "URICUR", "LYMEIGGIGMAB", "LYMEABIGMQN", "HLAB27"   Coagulation Lab  Results  Component Value Date   INR 0.94 07/17/2016   LABPROT 12.6 07/17/2016  APTT 27 07/17/2016   PLT 224 08/20/2023     Cardiovascular Lab Results  Component Value Date   BNP 1,509 (H) 07/22/2014   CKTOTAL 85 07/22/2014   CKMB 0.7 07/22/2014   TROPONINI 0.05 07/22/2014   HGB 13.5 08/20/2023   HCT 41.5 08/20/2023     Screening Lab Results  Component Value Date   SARSCOV2NAA NEGATIVE 08/18/2020   STAPHAUREUS NEGATIVE 08/20/2023   MRSAPCR NEGATIVE 08/20/2023     Cancer Lab Results  Component Value Date   CEA 2.8 04/06/2017     Allergens No results found for: "ALMOND", "APPLE", "ASPARAGUS", "AVOCADO", "BANANA", "BARLEY", "BASIL", "BAYLEAF", "GREENBEAN", "LIMABEAN", "WHITEBEAN", "BEEFIGE", "REDBEET", "BLUEBERRY", "BROCCOLI", "CABBAGE", "MELON", "CARROT", "CASEIN", "CASHEWNUT", "CAULIFLOWER", "CELERY"     Note: Lab results reviewed.  Meds   Current Facility-Administered Medications:    acetaminophen (OFIRMEV) IV 1,000 mg, 1,000 mg, Intravenous, Once PRN, Reed Breech, MD   acetaminophen (TYLENOL) tablet 1,000 mg, 1,000 mg, Oral, Q6H, Laban Emperor, Sherol Sabas, MD   cephALEXin (KEFLEX) capsule 500 mg, 500 mg, Oral, Q8H, Laban Emperor, Shakeda Pearse, MD   fentaNYL (SUBLIMAZE) injection 25-50 mcg, 25-50 mcg, Intravenous, Q5 min PRN, Reed Breech, MD   fentaNYL (SUBLIMAZE) injection 25-50 mcg, 25-50 mcg, Intravenous, Q5 min PRN, Stephanie Coup, MD   HYDROcodone-acetaminophen (NORCO/VICODIN) 5-325 MG per tablet 1 tablet, 1 tablet, Oral, Q6H PRN, Delano Metz, MD   lactated ringers infusion, , Intravenous, Continuous, Reed Breech, MD   lactated ringers infusion, , Intravenous, Continuous, Piscitello, Cleda Mccreedy, MD, Last Rate: 10 mL/hr at 08/28/23 0922, New Bag at 08/28/23 0922   ondansetron (ZOFRAN) injection 4 mg, 4 mg, Intravenous, Once PRN, Reed Breech, MD   oxyCODONE (Oxy IR/ROXICODONE) immediate release tablet 5 mg, 5 mg, Oral, Once PRN **OR** oxyCODONE (ROXICODONE) 5  MG/5ML solution 5 mg, 5 mg, Oral, Once PRN, Reed Breech, MD   oxyCODONE (Oxy IR/ROXICODONE) immediate release tablet 5 mg, 5 mg, Oral, Once PRN **OR** oxyCODONE (ROXICODONE) 5 MG/5ML solution 5 mg, 5 mg, Oral, Once PRN, Stephanie Coup, MD  Current Outpatient Medications:    acetaminophen (TYLENOL) 500 MG tablet, Take 500 mg by mouth every 6 (six) hours as needed., Disp: , Rfl:    amLODipine (NORVASC) 10 MG tablet, Take 10 mg by mouth daily., Disp: , Rfl:    atorvastatin (LIPITOR) 20 MG tablet, Take 20 mg by mouth daily., Disp: , Rfl:    cephALEXin (KEFLEX) 500 MG capsule, Take 1 capsule (500 mg total) by mouth 4 (four) times daily for 7 days., Disp: 28 capsule, Rfl: 0   HYDROcodone-acetaminophen (NORCO/VICODIN) 5-325 MG tablet, Take 1 tablet by mouth every 6 (six) hours as needed for up to 7 days for severe pain. Must last 7 days., Disp: 28 tablet, Rfl: 0   HYDROcodone-acetaminophen (NORCO/VICODIN) 5-325 MG tablet, Take 1 tablet by mouth every 6 (six) hours as needed for up to 7 days for severe pain. Must last 7 days., Disp: 28 tablet, Rfl: 0   omeprazole (PRILOSEC) 20 MG capsule, Take 20 mg by mouth daily., Disp: , Rfl:    pregabalin (LYRICA) 25 MG capsule, Take 1 capsule (25 mg total) by mouth at bedtime for 15 days, THEN 2 capsules (50 mg total) at bedtime., Disp: 105 capsule, Rfl: 0   solifenacin (VESICARE) 5 MG tablet, Take 5 mg by mouth daily., Disp: , Rfl:    venlafaxine (EFFEXOR) 75 MG tablet, Take 75 mg by mouth daily., Disp: , Rfl:    nitroGLYCERIN (NITROSTAT) 0.4 MG SL tablet, Place 1 tablet (0.4 mg  total) under the tongue every 5 (five) minutes as needed for chest pain., Disp: 25 tablet, Rfl: 6  ROS  Constitutional: Denies any fever or chills Gastrointestinal: No reported hemesis, hematochezia, vomiting, or acute GI distress Musculoskeletal: Denies any acute onset joint swelling, redness, loss of ROM, or weakness Neurological: No reported episodes of acute onset apraxia, aphasia,  dysarthria, agnosia, amnesia, paralysis, loss of coordination, or loss of consciousness  Allergies  Rachel [name] is allergic to prednisone.  PFSH  Drug: Rachel [name]  reports no history of drug use. Alcohol:  reports no history of alcohol use. Tobacco:  reports that she has been smoking cigarettes. She has never used smokeless tobacco. Medical:  has a past medical history of Anginal pain (HCC), Arthritis, Body mass index (BMI) of 24.0-24.9 in adult, Cardiovascular disease, Colostomy present (HCC), Current every day smoker, Depression, GERD (gastroesophageal reflux disease), Headache, History of rectal cancer, History of UTI, Hypertension, Midsternal chest pain, Personal history of chemotherapy (2017), Personal history of radiation therapy (2017), Port-A-Cath in place, Rectal cancer (HCC) (2017), Stroke [facility]), Syncope and collapse, and Thyroid nodule (2018). Surgical: Rachel [name]  has a past surgical history that includes Partial hysterectomy; ERCP; Colonoscopy with propofol (N/A, 08/15/2016); Portacath placement (N/A, 08/18/2016); Colonoscopy with propofol (N/A, 11/08/2016); laparoscopy (N/A, 11/23/2016); Colostomy (N/A, 11/23/2016); Biopsy thyroid (Right, 11/16/2016); Abdominal perineal bowel resection (N/A, 03/16/2017); Appendectomy (03/16/2017); Thyroid lobectomy (Right, 11/05/2017); Incision and drainage perirectal abscess (N/A, 04/15/2018); Back surgery (2000); Abdominal hysterectomy (1999); Cholecystectomy (1983); Colonoscopy with propofol (N/A, 08/20/2020); Colon surgery; and Colonoscopy with propofol (N/A, 03/21/2023). Family: family history includes Breast cancer (age of onset: 36) in her mother; Heart disease in her paternal grandmother; Hyperlipidemia in her father; Prostate cancer in her brother; Stroke in her maternal grandfather.  Constitutional Exam  General appearance: Well nourished, well developed, and well hydrated. In no apparent acute distress Vitals:   08/28/23 1245 08/28/23 1300  08/28/23 1315 08/28/23 1345  BP: 109/67 (!) 98/54 101/65 112/67  Pulse: 60 (!) 55 (!) 59 63  Resp: 13 16 15 16   Temp: (!) 97.2 F (36.2 C)   98 F (36.7 C)  TempSrc:    Tympanic  SpO2: 93% 99% 99% 99%  Weight:      Height:       BMI Assessment: Estimated body mass index is 29.86 kg/m as calculated from the following:   Height as of this encounter: 5\' 6"  (1.676 m).   Weight as of this encounter: 83.9 kg.   Patient's current BMI Ideal Body weight  Body mass index is 29.86 kg/m. 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)  DDD (degenerative disc disease), lumbosacral  Dextroscoliosis of lumbar spine  Failed back surgical syndrome  Lumbar intervertebral foraminal disc herniation (Left: L4-5)  Lumbar nerve root impingement (Left: L4) (Right: L5)  Lumbar post-laminectomy syndrome  Lumbar radicular pain  Lumbosacral intervertebral foraminal disc herniation/extrusion (Right: L5-S1)   Persistent Persistent Persistent   Updated Problems: Problem  Dextroscoliosis of Lumbar Spine  Ddd (Degenerative Disc Disease), Lumbosacral  Lumbar intervertebral foraminal disc herniation (Left: L4-5)   (03/06/2023) LUMBAR MRI FINDINGS: LEVELS: L4-5: Disc narrowing and bulging with endplate and facet spurring. Left foraminal  herniation with left L4 impingement.   IMPRESSION: 1. L4-5 left foraminal herniation and impingement.   Lumbosacral intervertebral foraminal disc herniation/extrusion (Right: L5-S1)   (03/06/2023) LUMBAR MRI FINDINGS: LEVELS: L5-S1: Disc narrowing and bulging with right foraminal extrusion.   IMPRESSION: 1. L5-S1 right foraminal extrusion with advanced  impingement.   Lumbar nerve root impingement (Left: L4) (Right: L5)   (03/06/2023) LUMBAR MRI FINDINGS: LEVELS: L4-5: Disc narrowing and bulging with Left foraminal herniation and left L4 impingement.  L5-S1: Disc narrowing and bulging with right foraminal extrusion and advanced impingement.  IMPRESSION: 1. L5-S1 right foraminal extrusion with advanced impingement. 2. L4-5 left foraminal herniation and impingement.   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  DISC LEVELS: T12- L1: Mild disc bulging L1-2: Mild facet spurring L2-3: Disc height loss and bulging with ventral spondylitic spurring L3-4: Disc narrowing with endplate spurring. L4-5: Disc narrowing and bulging with endplate and facet spurring. Left foraminal herniation with left L4 impingement. Patent spinal canal L5-S1: Disc narrowing and bulging with right foraminal extrusion. Degenerative facet spurring on both sides. Moderate left foraminal narrowing.  IMPRESSION: 1. L5-S1 right foraminal extrusion with advanced impingement. 2. L4-5 left foraminal herniation and impingement. 3. Generalized lumbar spine degeneration with mild scoliosis. Diffusely patent spinal canal.   Lumbar Post-Laminectomy Syndrome  Lumbar Radicular Pain  Failed Back Surgical Syndrome    Plan of Care  Pharmacotherapy (Medications Ordered): Meds ordered this encounter  Medications   lactated ringers infusion   OR Linked Order Group    oxyCODONE (Oxy IR/ROXICODONE) immediate release tablet 5 mg    oxyCODONE (ROXICODONE) 5 MG/5ML solution 5 mg   fentaNYL (SUBLIMAZE) injection 25-50 mcg   acetaminophen (OFIRMEV) IV 1,000 mg    Order Specific Question:   IV acetaminophen for PACU patients (adjunct analgesic)    Answer:   Yes   ondansetron (ZOFRAN) injection 4 mg   lactated ringers infusion   OR Linked Order Group     chlorhexidine (PERIDEX) 0.12 % solution 15 mL    Oral care mouth rinse   DISCONTD: ceFAZolin (ANCEF) IVPB 1 g/50 mL premix    Order Specific Question:   Antibiotic Indication:    Answer:   Surgical Prophylaxis   DISCONTD: ceFAZolin (ANCEF) IVPB 1 g/50 mL premix    Order Specific Question:   Indication:    Answer:   Surgical Prophylaxis   ceFAZolin (ANCEF) IVPB 2g/100 mL premix    Order Specific Question:   Indication:    Answer:   Surgical Prophylaxis   acetaminophen (TYLENOL) tablet 1,000 mg   DISCONTD: bupivacaine (MARCAINE) 30 mL, lidocaine (PF) (XYLOCAINE) 1 % 30 mL   DISCONTD: 0.9 % irrigation (POUR BTL)   DISCONTD: hydrogen peroxide 3 % external solution   OR Linked Order Group    oxyCODONE (Oxy IR/ROXICODONE) immediate release tablet 5 mg    oxyCODONE (ROXICODONE) 5 MG/5ML solution 5 mg   fentaNYL (SUBLIMAZE) injection 25-50 mcg   cephALEXin (KEFLEX) capsule 500 mg   HYDROcodone-acetaminophen (NORCO/VICODIN) 5-325 MG per tablet 1 tablet   cephALEXin (KEFLEX) 500 MG capsule    Sig: Take 1 capsule (500 mg total) by mouth 4 (four) times daily for 7 days.    Dispense:  28 capsule    Refill:  0    Do not place medication on "Automatic Refill". Fill one day early if pharmacy is closed on scheduled refill date.   HYDROcodone-acetaminophen (  NORCO/VICODIN) 5-325 MG tablet    Sig: Take 1 tablet by mouth every 6 (six) hours as needed for up to 7 days for severe pain. Must last 7 days.    Dispense:  28 tablet    Refill:  0    For acute post-operative pain. Not to be refilled. Must last 7 days.   HYDROcodone-acetaminophen (NORCO/VICODIN) 5-325 MG tablet    Sig: Take 1 tablet by mouth every 6 (six) hours as needed for up to 7 days for severe pain. Must last 7 days.    Dispense:  28 tablet    Refill:  0    For acute post-operative pain. Not to be refilled. Must last 7 days.   Medications administered today: @FAMEDPROSE @  Orders:  Orders Placed This Encounter  Procedures   DG C-Arm  1-60 Min-No Report    Standing Status:   Standing    Number of Occurrences:   1    Order Specific Question:   Reason for exam:    Answer:   surgery   DG Lumbar Spine 2-3 Views    Standing Status:   Standing    Number of Occurrences:   1    Order Specific Question:   Symptom/Reason for Exam    Answer:   Surgery, elective [660630]   Diet NPO time specified    Standing Status:   Standing    Number of Occurrences:   1   Discharge  per PACU criteria    per PACU criteria    Standing Status:   Standing    Number of Occurrences:   1   Vital signs    Standing Status:   Standing    Number of Occurrences:   1   Cardiac monitoring    Standing Status:   Standing    Number of Occurrences:   1   Notify physician (specify)    Standing Status:   Standing    Number of Occurrences:   [id number]    Order Specific Question:   Notify for:    Answer:   if maximum analgesia dose is reached and pain score remains 3 or greater    Order Specific Question:   Notify for:    Answer:   blood glucose <90 or >250 (after administering insulin).    Order Specific Question:   Notify for:    Answer:   pulse less than 60 or greater than 150    Order Specific Question:   Notify for:    Answer:   respiratory rate less than 12 or greater than 35    Order Specific Question:   Notify for:    Answer:   temperature greater than 38.5    Order Specific Question:   Notify for:    Answer:   urinary output less than .5 mL/kg/hr    Order Specific Question:   Notify for:    Answer:   SBP<80 or >160. DBP>100   May continue current IVF if bag empty and / or patient is nauseated and no further IV orders are written    Standing Status:   Standing    Number of Occurrences:   1   Vital signs Per Protocol    Standing Status:   Standing    Number of Occurrences:   1   Notify physician (specify)    Standing Status:   Standing    Number of Occurrences:   99999   Diet Per Protocol    Standing Status:  Standing    Number of  Occurrences:   1   Pre-admission testing diagnosis    Standing Status:   Standing    Number of Occurrences:   1    Order Specific Question:   Diagnosis    Answer:   Encounter for other preprocedural examination [Z01.818]   Medication Instructions Per Protocol    Standing Status:   Standing    Number of Occurrences:   1   Discharge instructions    Give patient/families discharge instruction brochure or individual surgeons' instructions to read PREOP.    Standing Status:   Standing    Number of Occurrences:   1   Void    On call to O.R.    Standing Status:   Standing    Number of Occurrences:   1   IV Fluids Per Protocol    Standing Status:   Standing    Number of Occurrences:   1   May use Lidocaine 1% - 2% subcutaneously prior to IV start if patient not allergic to Lidocaine    1% - 2% subcutaneously prior to IV start if patient not allergic to Lidocaine    Standing Status:   Standing    Number of Occurrences:   20   Follow Diabetes Medication Adjustment Guidelines Prior to Procedure and Surgery    Standing Status:   Standing    Number of Occurrences:   1   Anesthesia Preoperative Order    Standing Status:   Standing    Number of Occurrences:   1   Pre-admission testing diagnosis    Standing Status:   Standing    Number of Occurrences:   1    Order Specific Question:   Diagnosis    Answer:   Encounter for other preprocedural examination [Z01.818]   Verify informed consent    Standing Status:   Standing    Number of Occurrences:   1   If patient takes anticoagulants and/or antiplatelets, verify patient has discontinued medications per MD instructions    Standing Status:   Standing    Number of Occurrences:   1   Patient is a candidate for same day surgery    Standing Status:   Standing    Number of Occurrences:   1   Labs per anesthesia    Standing Status:   Standing    Number of Occurrences:   1   Outpatient / Same Day Surgery    Standing Status:   Standing    Number  of Occurrences:   1   Vital signs per PACU    Standing Status:   Standing    Number of Occurrences:   1   Notify physician (specify) Notify physician for pulse less than 50 or greater than 120, temperature greater than 101.5 F, urinary output less than 240 ml/8 hours, systolic BP less than 90 or greater than 170, diastolic BP greater than 110, Oxygen ...    Notify physician for pulse less than 50 or greater than 120, temperature greater than 101.5 F, urinary output less than 240 ml/8 hours, systolic BP less than 90 or greater than 170, diastolic BP greater than 110, Oxygen saturation less than 90%, JP drainage greater than 300 ml/2 hours, uncontrolled pain.    Standing Status:   Standing    Number of Occurrences:   1   Out of Bed as tolerated    Standing Status:   Standing    Number of Occurrences:  1   Abdominal Binder    Fit patient with appropriate size abdominal binder and send discharge home with it. Use at all times for initial 3 days, and then only during activities requiring bending at the waist.    Standing Status:   Standing    Number of Occurrences:   1    Order Specific Question:   Patient to wear    Answer:   At all times   Advance diet as tolerated    Standing Status:   Standing    Number of Occurrences:   1    Order Specific Question:   Advance from    Answer:   DIET NPO    Order Specific Question:   Advance to    Answer:   Diet regular    Order Specific Question:   Fluid consistency:    Answer:   Thin   Initiate Oral Care Protocol    Standing Status:   Standing    Number of Occurrences:   1   Initiate Carrier Fluid Protocol    Standing Status:   Standing    Number of Occurrences:   1   Discharge  per PACU criteria    per PACU criteria    Standing Status:   Standing    Number of Occurrences:   1   Vital signs    Standing Status:   Standing    Number of Occurrences:   1   Notify physician (specify)    Standing Status:   Standing    Number of Occurrences:    [phone]    Order Specific Question:   Notify for:    Answer:   if maximum analgesia dose is reached and pain score remains 3 or greater    Order Specific Question:   Notify for:    Answer:   blood glucose <90 or >250 (after administering insulin).    Order Specific Question:   Notify for:    Answer:   pulse less than 60 or greater than 150    Order Specific Question:   Notify for:    Answer:   respiratory rate less than 12 or greater than 35    Order Specific Question:   Notify for:    Answer:   temperature greater than 38.5    Order Specific Question:   Notify for:    Answer:   urinary output less than .5 mL/kg/hr    Order Specific Question:   Notify for:    Answer:   SBP<80 or >160. DBP>100   Increase activity slowly   Full code    Standing Status:   Standing    Number of Occurrences:   1    Order Specific Question:   By:    Answer:   Consent: discussion documented in EHR   Pulse oximetry, continuous    Standing Status:   Standing    Number of Occurrences:   1   Oxygen therapy Mode or (Route): Nasal cannula; FiO2: OTHER SEE COMMENTS; Liters Per Minute: 2; Keep 02 saturation: greater than or equal to 92 %    Standing Status:   Standing    Number of Occurrences:   1    Order Specific Question:   Mode or (Route)    Answer:   Nasal cannula    Order Specific Question:   FiO2    Answer:   OTHER SEE COMMENTS    Order Specific Question:   Liters Per Minute    Answer:  2    Order Specific Question:   Keep 02 saturation    Answer:   greater than or equal to 92 %   Continuous pulse oximetry    For all sedated patients    Standing Status:   Standing    Number of Occurrences:   1   Oxygen therapy Mode or (Route): Nasal cannula; Keep 02 saturation: > 92%    2-4 Liters/min. Titrate as needed.  For all sedated patients    Standing Status:   Standing    Number of Occurrences:   20    Order Specific Question:   Mode or (Route)    Answer:   Nasal cannula    Order Specific Question:   Keep  02 saturation    Answer:   > 92%   Pulse oximetry, continuous    Standing Status:   Standing    Number of Occurrences:   1   Oxygen therapy Mode or (Route): Nasal cannula; FiO2: OTHER SEE COMMENTS; Liters Per Minute: 2; Keep 02 saturation: greater than or equal to 92 %    Standing Status:   Standing    Number of Occurrences:   1    Order Specific Question:   Mode or (Route)    Answer:   Nasal cannula    Order Specific Question:   FiO2    Answer:   OTHER SEE COMMENTS    Order Specific Question:   Liters Per Minute    Answer:   2    Order Specific Question:   Keep 02 saturation    Answer:   greater than or equal to 92 %   Insert and maintain IV line    Nurse to order flush per facility specific protocol.    Standing Status:   Standing    Number of Occurrences:   1   Discharge patient Discharge disposition: 01-Home or Self Care; Discharge patient date: 08/28/2023    Standing Status:   Standing    Number of Occurrences:   1    Order Specific Question:   Discharge disposition    Answer:   01-Home or Self Care [1]    Order Specific Question:   Discharge patient date    Answer:   08/28/2023   Lab Orders  No laboratory test(s) ordered today   Imaging Orders         DG C-Arm 1-60 Min-No Report         DG Lumbar Spine 2-3 Views     Referral Orders  No referral(s) requested today   Planned follow-up:   Post-op follow-up (see chart for scheduled appointment).    Recent Visits No visits were found meeting these conditions. Showing recent visits within past 90 days and meeting all other requirements Future Appointments No visits were found meeting these conditions. Showing future appointments within next 90 days and meeting all other requirements  Primary Care Physician: Dortha Kern, MD Location: [facility] Facility Note by: Oswaldo Done, MD (TTS technology used. I apologize for any typographical errors that were not detected and corrected.) Date:  08/28/2023; Time: 10:38 AM  Note: This dictation was prepared with Dragon dictation. Any transcriptional errors that may result from this process are unintentional.

## 2023-08-28 NOTE — Op Note (Signed)
Patient's Name: Rachel [name]  MRN: [id number]  Referring Provider: Delano Metz, MD  DOB: 06-15-1956  PCP: Dortha Kern, MD  DOS: 08/28/2023  Surgeon: Oswaldo Done, MD  Service setting: Outpatient Same Day Surgery  Specialty: Interventional Pain Management  Patient type: Established  Location: [facility] OR ROOM 03  Visit type: Surgical Intervention   Primary Reason for Admission: Surgical management of chronic pain condition.  Surgical Procedure #1  Anesthesia, Analgesia, Anxiolysis:  Type: Spinal Cord Neurostimulator IPG (Implantable Pulse Generator. AKA: battery) Implant Region: Lumbosacral area IPG Level: Buttocks, below and lateral to PSIS, below patient's belt-line. Area selected due to estimated location between seat and backrest on a chair, durning normal sitting position. IPG Laterality: Left-sided Surgeon: Delano Metz, MD  Type: General (General) Staff:  Anesthesiologist: Reed Breech, MD CRNA: Merlene Pulling, CRNA  Sedation: Meaningful verbal contact was maintained during the critical portions of the procedure  Local/Regional Analgesia: Local anesthetic infiltration by Surgeon Oswaldo Done, MD) Local Anesthetic: Bupivacaine 0.5% + Lidocaine 1.0% in a 50:50 Mix Indication(s): Analgesia   Surgical Procedure #2    Surgery: Percutaneous, Posterior Epidural Neurostimulator Lead(s)  Implant w/o laminectomy/laminotomy Type: Dual Permanent Lead Implant Region: Lumbar Insertion Level:  T11-12 Top Electrode Final Resting Area: T8 Laterality: Bilateral Surgeon: Edward Jolly, MD     Position: Prone Target Area: Posterior Epidural Space, as close to midline as anatomically possible. 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 (year old), female patient, seen today for interventional treatment. She  has a past surgical history that includes Partial hysterectomy; ERCP; Colonoscopy with propofol (N/A, 08/15/2016); Portacath placement (N/A, 08/18/2016); Colonoscopy with propofol (N/A, 11/08/2016); laparoscopy (N/A, 11/23/2016); Colostomy (N/A, 11/23/2016); Biopsy thyroid (Right, 11/16/2016); Abdominal perineal bowel resection (N/A, 03/16/2017); Appendectomy (03/16/2017); Thyroid lobectomy (Right, 11/05/2017); Incision and drainage perirectal abscess (N/A, 04/15/2018); Back surgery (2000); Abdominal hysterectomy (1999); Cholecystectomy (1983); Colonoscopy with propofol (N/A, 08/20/2020); Colon surgery; and Colonoscopy with propofol (N/A, 03/21/2023).  Initial Vital Signs:  Pulse/EKG Rate: 86ECG Heart Rate: 61 Temp: 98.5 F (36.9 C) Resp: 18 BP: 129/81 SpO2: 93 %  BMI: Estimated body mass index is 29.86 kg/m as calculated from the following:   Height as of this encounter: 5\' 6"  (1.676 m).   Weight as of this encounter: 83.9 kg.  Risk Assessment: Allergies: Reviewed. She is allergic to prednisone.  Allergy Precautions: None required Coagulopathies: Reviewed. None identified.  Blood-thinner therapy: None at this time Active Infection(s): Reviewed. None identified. [name] is afebrile  Site Confirmation: [name] was asked to confirm the procedure and laterality before marking the site, which she did. Procedure  checklist: Completed Consent: Before the procedure and under the influence of no sedative(s), amnesic(s), or anxiolytics, the patient was informed of the treatment options, risks and possible complications. To fulfill our ethical and legal obligations, as recommended by the American Medical Association's Code of Ethics, I have informed  the patient of my clinical impression; the nature and purpose of the treatment or procedure; the risks, benefits, and possible complications of the intervention; the alternatives, including doing nothing; the risk(s) and benefit(s) of the alternative treatment(s) or procedure(s); and the risk(s) and benefit(s) of doing nothing.  [name] was provided with information about the general risks and possible complications associated with most interventional procedures. These include, but are not limited to: failure to achieve desired goals, infection, bleeding, organ or nerve damage, allergic reactions, paralysis, and/or death.  In addition, she was informed of those risks and possible complications associated to this particular procedure, which include, but are not limited to: damage to the implant; failure to decrease pain; local, systemic, or serious CNS infections, intraspinal abscess with possible cord compression and paralysis, or life-threatening such as meningitis; intrathecal and/or epidural bleeding with formation of hematoma with possible spinal cord compression and permanent paralysis; organ damage; nerve injury or damage with subsequent sensory, motor, and/or autonomic system dysfunction, resulting in transient or permanent pain, numbness, and/or weakness of one or several areas of the body; allergic reactions, either minor or major life-threatening, such as anaphylactic or anaphylactoid reactions.  Furthermore, [name] was informed of those risks and complications associated with the medications. These include, but are not limited to: allergic reactions (i.e.:  anaphylactic or anaphylactoid reactions); arrhythmia;  Hypotension/hypertension; cardiovascular collapse; respiratory depression and/or shortness of breath; swelling or edema; medication-induced neural toxicity; particulate matter embolism and blood vessel occlusion with resultant organ, and/or nervous system infarction and permanent paralysis.  Finally, she was informed that Medicine is not an exact science; therefore, there is also the possibility of unforeseen or unpredictable risks and/or possible complications that may result in a catastrophic outcome. The patient indicated having understood very clearly. We have given the patient no guarantees and we have made no promises. Enough time was given to the patient to ask questions, all of which were answered to the patient's satisfaction. Ms. Starace has indicated that she wanted to continue with the procedure. Attestation: I, the ordering provider, attest that I have discussed with the patient the benefits, risks, side-effects, alternatives, likelihood of achieving goals, and potential problems during recovery for the procedure that I have provided informed consent. Date  Time: 08/28/2023  8:50 AM   Pre-Procedure Preparation:  Monitoring: As per Anesthesia protocol. Respiration, ETCO2, SpO2, BP, heart rate and rhythm monitor placed and checked for adequate function Safety Precautions: Patient was assessed for positional comfort and pressure points before starting the procedure. Time-out: I initiated and conducted the "Time-out" before starting the procedure, as per protocol. The patient was asked to participate by confirming the accuracy of the "Time Out" information. Verification of the correct person, site, and procedure were performed and confirmed by me, the nursing staff, and the patient. "Time-out" conducted as per Joint Commission's Universal Protocol (UP.01.01.01). Admit time: 08/28/2023  8:50 AM  Description of Procedure:   Safety Precautions:  Aspiration looking for blood return was conducted prior to all injections. At no point did we inject any substances, as a needle was being advanced. No attempts were made at seeking any paresthesias. Safe injection practices and needle disposal techniques used. Medications properly checked for expiration dates. SDV (single dose vial) medications used. Description of the procedure: The procedure site was prepped using a broad-spectrum topical antiseptic. The area was then draped in the usual and standard manner. "Time-out" was performed as per JC Universal Protocol (UP.01.01.01).  Neurostimulator implant: Availability of a responsible, adult driver,  and NPO status confirmed. Informed consent was obtained after having discussed risks and possible complications with the patient, including, but not limited to, infection, bleeding, nerve damage, paralysis, and death. Prior to the procedure date, written information had been given to the patient, on the procedure, as well as its risks and possible complications. Ample time was given to the patient to ask questions, all of which were answered, to the patient's satisfaction. Baseline vital signs were taken and the Nursing and Anesthesia assessments were completed. An IV was started. The patient was then taken to the designated operating room [facility] OR ROOM 03), where the patient was placed in position for the surgery, over the fluoroscopy-compatible surgical table. Ms. Nachtman was monitored as per Anesthesia standard protocols, using NIBPM, ECG, and pulse oxymetry. The procedure area on the patient was prepped using a broad-spectrum topical antiseptic. The area was then draped in the usual and standard manner. Meaningful verbal contact was maintained with the patient at all times.  Fluoroscopy was manipulated, using "Tunnel Vision Technique", to obtain the best possible view of the target area. Parallex error was corrected before commencing the procedure. Timeout was  completed. Once a clear view of the target had been obtained, the skin and deeper tissues over the procedure site were infiltrated using a 50:50 mix of lidocaine:bupivacaine, loaded in 10 cc luer-loc syringes with a 1.5-inch, 25-G needle. A midline incision was made over the area where needle placement was planned. Hemostasis w/ cautery attained. The epidural needle was inserted under live fluoroscopic guidance. No attempt was made at seeking any paresthesias. The paramidline approach was used to enter the posterior epidural space at a 30 angle, using "Loss-of-resistance Technique" with 3 ml of PF-NaCl (0.9% NSS) + 0.5 cc of air, in a 5cc glass syringe, using a "loss-of-resistance technique". Correct needle placement was confirmed in the antero-posterior and lateral fluoroscopic views. With Ms. Sanker able to provide feedback, the neurostimulator lead was gently introduced under real-time fluoroscopy, constantly asking the patient for pain or paresthesias. The upper electrode was gently guided to the target level. Once archived, antero-posterior and lateral fluoroscopic views were taken to confirm needle placement in two planes. Electrode placement was then tested until a comfortable stimulation pattern was achieved over the desired pain distribution. Once stimulation was in the correct area, the epidural needle(s) were removed under live fluoroscopic guidance to avoid lead migration. Supraspinal ligament lead anchoring was done using silk suture and the anchors provided with the implant kit. At this point, having completed the lead placement , testing, and anchoring, Ms. Picardi was provided with additional sedation, and the skin over the IPG implant site was infiltrated using the same lidocaine:bupivacaine mix. Once the area was properly anesthetized and Ms. Chio sedated, the IPG pocket site was created using the Bovie cautery for hemostasis. The lead(s) were tunneled and connected to the IPG. Proper connection was  assessed using impedance checks. Once proper hemostasis was confirmed, both wounds were closed with vicryl 2-0 after cleaning them with a solution containing 50:50 hydrogen peroxide and betadine. Surgical staples were used to close the skin. The wounds were covered with sterile transparent bio-occlusive dressings, to easily assess any evidence of infection in the future. Ms. Luers was then transported to the PACU area for recovery and implant programming. An abdominal binder was then ordered to assist in preventing seromas, and to remind Ms. Morre to follow the written recommendations on post-op movement limitations.  EBL: 10 mL  Vitals:   08/28/23 1245 08/28/23  1300 08/28/23 1315 08/28/23 1345  BP: 109/67 (!) 98/54 101/65 112/67  Pulse: 60 (!) 55 (!) 59 63  Resp: 13 16 15 16   Temp: (!) 97.2 F (36.2 C)   98 F (36.7 C)  TempSrc:    Tympanic  SpO2: 93% 99% 99% 99%  Weight:      Height:         Start Time: 10:31 AM  Time to End: 2 Hr 2 Min 0 Sec  Neurostimulator Implant Details:   Implants     Lead   Kit Lead - [id number] - Implanted   (Right) Spine Lumbar    Inventory item: KIT LEAD Model/Cat number: [id number]   Manufacturer: MEDTRONIC NEUROMOD PAIN MGMT Lot number: [id number]   Area Of Implantation: Spine Lumbar      As of 08/28/2023     Status: Implanted               Kit Lead - Sna - Implanted   (Left) Spine Lumbar    Inventory item: KIT LEAD Model/Cat number: [id number]   Serial number: NA Manufacturer: MEDTRONIC NEUROMOD PAIN MGMT   Lot number: [id number] Area Of Implantation: Spine Lumbar    As of 08/28/2023     Status: Implanted               Catheter   [place] - [id number] - Implanted   (Right) Subclavian    Inventory item: KIT PORT POWER 8FR ISP CVUE Model/Cat number: [id number]   Manufacturer: [employer] Area Of Implantation: Subclavian    As of 08/18/2016     Status: Implanted               Mesh General   Mesh Bio-A 8x8 Syn Matr -  [id number] - Implanted   (Left) Abdomen    Inventory item: MESH BIO-A 8X8 SYN MATR Model/Cat number: [id number]   Serial number: [id number] Manufacturer: [employer]    As of 11/23/2016     Status: Implanted               Envelope Absorb Antibacterial - Sna - Implanted   (Right) Spine Lumbar    Inventory item: ENVELOPE ABSORB ANTIBACTERIAL Model/Cat number: [id number]   Serial number: NA Manufacturer: MEDTRONIC NEUROMOD PAIN MGMT   Lot number: [id number]      As of 08/28/2023     Status: Implanted               Type Not Specified   Inceptiv - Implanted   (Right) Spine Lumbar    Model/Cat number: [id number] Serial number: [id number]   Manufacturer: [employer] [place] [employer] Lot number: [id number]    As of 08/28/2023     Status: Implanted                  Generator(Battery):  Brand: Medtronic Location: Upper buttocks area, lateral to PSIS Laterality:  Left MRI compatibility: Yes Recharging capability: Yes  Lead(s):  Brand: Medtronic Epidural Access Level:  T12-L1 T12-L1  Lead implant:  Bilateral   No. of Electrodes/Lead:  8 8  Laterality:  Right Left  Top electrode location:  T7 T7  Bottom electrode location:  T9 T9  MRI compatibility:  Yes Yes       Visiting Personnel:  Visitor: Visitor - Dr. Edward Jolly - Comments: assisting Dr. Laban Emperor  Imaging Guidance (Spinal):          Type of Imaging Technique: Fluoroscopy Guidance (Spinal) Indication(s): Assistance in needle guidance and placement  for procedures requiring needle placement in or near specific anatomical locations not easily accessible without such assistance. Exposure Time: Please see nurses notes. Contrast: None used. Fluoroscopic Guidance: I was personally present during the use of fluoroscopy. "Tunnel Vision Technique" used to obtain the best possible view of the target area. Parallax error corrected before commencing the procedure. "Direction-depth-direction" technique used to introduce the needle under  continuous pulsed fluoroscopy. Once target was reached, antero-posterior, oblique, and lateral fluoroscopic projection used confirm needle placement in all planes. Images permanently stored in EMR.      Interpretation: No contrast injected. I personally interpreted the imaging intraoperatively. Adequate needle placement confirmed in multiple planes. Permanent images saved into the patient's record.  Antibiotic Prophylaxis:   Antibiotics Given (last 72 hours)     Date/Time Action Medication Dose   08/28/23 1046 Given  [1 g admin per [place]. Asked if he wanted 2g, and requested 1g.]   ceFAZolin (ANCEF) IVPB 2g/100 mL premix 1 g      Indication(s): None identified  Post-operative Assessment:  Post-procedure Vital Signs:  Pulse/HCG Rate: 63(!) 59 Temp: 98 F (36.7 C) Resp: 16 BP: 112/67 SpO2: 99 %  Complications: No immediate post-treatment complications observed by team, or reported by patient.  Note: The patient tolerated the entire procedure well. A repeat set of vitals were taken after the procedure and the patient was kept under observation following institutional policy, for this type of procedure. Post-procedural neurological assessment was performed, showing return to baseline, prior to discharge. The patient was provided with post-procedure discharge instructions, including a section on how to identify potential problems. Should any problems arise concerning this procedure, the patient was given instructions to immediately contact us, at any time, without hesitation. In any case, we plan to contact the patient by telephone for a follow-up status report regarding this interventional procedure.  Comments:  No additional relevant information.  Plan of Care  Disposition: Discharge home under the care of a responsible, capable, adult driver. Return to clinics in 6-10 days for post-procedure evaluation and removal of staples.  Discharge (Date  Time): 08/28/2023  2:01  PM  Primary Care Physician: Dortha Kern, MD Location: [facility] Outpatient Pain Management Facility Note by: Oswaldo Done, MD (TTS technology used. I apologize for any typographical errors that were not detected and corrected.)  Disclaimer:  Medicine is not an Visual merchandiser. The only guarantee in medicine is that nothing is guaranteed. It is important to note that the decision to proceed with this intervention was based on the information collected from the patient. The Data and conclusions were drawn from the patient's questionnaire, the interview, and the physical examination. Because the information was provided in large part by the patient, it cannot be guaranteed that it has not been purposely or unconsciously manipulated. Every effort has been made to obtain as much relevant data as possible for this evaluation. It is important to note that the conclusions that lead to this procedure are derived in large part from the available data. Always take into account that the treatment will also be dependent on availability of resources and existing treatment guidelines, considered by other Pain Management Practitioners as being common knowledge and practice, at the time of the intervention. For Medico-Legal purposes, it is also important to point out that variation in procedural techniques and pharmacological choices are the acceptable norm. The indications, contraindications, technique, and results of the above procedure should only be interpreted and judged by a Board-Certified Interventional Pain Specialist with  extensive familiarity and expertise in the same exact procedure and technique.

## 2023-08-29 NOTE — Anesthesia Postprocedure Evaluation (Signed)
Anesthesia Post Note  Patient: Rachel [name]  Procedure(s) Performed: SPINAL CORD STIMULATOR PERMANANT IMPLANT (Spine Lumbar)  Patient location during evaluation: PACU Anesthesia Type: General Level of consciousness: awake and alert, oriented and patient cooperative Pain management: pain level controlled Vital Signs Assessment: post-procedure vital signs reviewed and stable Respiratory status: spontaneous breathing, nonlabored ventilation and respiratory function stable Cardiovascular status: blood pressure returned to baseline and stable Postop Assessment: adequate PO intake Anesthetic complications: no   No notable events documented.   Last Vitals:  Vitals:   08/28/23 1315 08/28/23 1345  BP: 101/65 112/67  Pulse: (!) 59 63  Resp: 15 16  Temp:  36.7 C  SpO2: 99% 99%    Last Pain:  Vitals:   08/29/23 0931  TempSrc:   PainSc: 3                  Reed Breech

## 2023-08-30 ENCOUNTER — Telehealth: Payer: Self-pay

## 2023-08-30 ENCOUNTER — Encounter: Payer: Self-pay | Admitting: Pain Medicine

## 2023-08-30 ENCOUNTER — Telehealth: Payer: Self-pay | Admitting: Student in an Organized Health Care Education/Training Program

## 2023-08-30 NOTE — Telephone Encounter (Signed)
Called patient to check on her after SCS implant.  LM for her to call back and let  us know how she was doing.

## 2023-08-30 NOTE — Telephone Encounter (Signed)
PT return call and stated that she is doing fine just sore. FYI

## 2023-09-06 ENCOUNTER — Encounter: Payer: Self-pay | Admitting: Student in an Organized Health Care Education/Training Program

## 2023-09-06 ENCOUNTER — Ambulatory Visit
Payer: Medicare PPO | Attending: Student in an Organized Health Care Education/Training Program | Admitting: Student in an Organized Health Care Education/Training Program

## 2023-09-06 VITALS — BP 141/89 | HR 95 | Temp 97.2°F | Resp 14 | Ht 66.0 in | Wt 185.0 lb

## 2023-09-06 DIAGNOSIS — G894 Chronic pain syndrome: Secondary | ICD-10-CM

## 2023-09-06 DIAGNOSIS — M961 Postlaminectomy syndrome, not elsewhere classified: Secondary | ICD-10-CM

## 2023-09-06 NOTE — Progress Notes (Signed)
PROVIDER NOTE: Information contained herein reflects review and annotations entered in association with encounter. Interpretation of such information and data should be left to medically-trained personnel. Information provided to patient can be located elsewhere in the medical record under "Patient Instructions". Document created using STT-dictation technology, any transcriptional errors that may result from process are unintentional.    Patient: Rachel [name]  Service Category: E/M  Provider: Edward Jolly, MD  DOB: 09/13/1956  DOS: 09/06/2023  Referring Provider: Dortha Kern, MD  MRN: [id number]  Specialty: Interventional Pain Management  PCP: Rachel Kern, MD  Type: Established Patient  Setting: Ambulatory outpatient    Location: Office  Delivery: Face-to-face     HPI  Rachel [name], a 67 y.o. year old female, is here today because of her Lumbar post-laminectomy syndrome [M96.1]. Rachel [name] primary complain today is Back Pain (lower)  Pain Assessment: Severity of Chronic pain is reported as a 3 /10. Location: Back Lower/right leg to the foot, left leg to the knee. Onset: More than a month ago. Quality: Crushing. Timing: Intermittent. Modifying factor(s): sitting. Vitals:  height is 5\' 6"  (1.676 m) and weight is 185 lb (83.9 kg). Her temporal temperature is 97.2 F (36.2 C) (abnormal). Her blood pressure is 141/89 (abnormal) and her pulse is 95. Her respiration is 14 and oxygen saturation is 95%.  BMI: Estimated body mass index is 29.86 kg/m as calculated from the following:   Height as of this encounter: 5\' 6"  (1.676 m).   Weight as of this encounter: 185 lb (83.9 kg). Last encounter: 08/14/2023. Last procedure: 06/26/2023.  Reason for encounter: post-procedure evaluation and assessment.   Patient presents today for staple removal after spinal cord stimulator implant.  [employer] team is here to start and program her therapy.  She denies any complications after her procedure.  Incision  site appears clean, not erythematous, nontender.  Staples were removed.  Instructed patient to follow-up in 4 weeks to see how she is doing with spinal cord stimulation  ROS  Constitutional: Denies any fever or chills Gastrointestinal: No reported hemesis, hematochezia, vomiting, or acute GI distress Musculoskeletal: Denies any acute onset joint swelling, redness, loss of ROM, or weakness Neurological: No reported episodes of acute onset apraxia, aphasia, dysarthria, agnosia, amnesia, paralysis, loss of coordination, or loss of consciousness  Medication Review  HYDROcodone-acetaminophen, acetaminophen, amLODipine, atorvastatin, nitroGLYCERIN, omeprazole, pregabalin, solifenacin, and venlafaxine  History Review  Allergy: Rachel [name] is allergic to prednisone. Drug: Rachel [name]  reports no history of drug use. Alcohol:  reports no history of alcohol use. Tobacco:  reports that she has been smoking cigarettes. She has never used smokeless tobacco. Social: Rachel [name]  reports that she has been smoking cigarettes. She has never used smokeless tobacco. She reports that she does not drink alcohol and does not use drugs. Medical:  has a past medical history of Anginal pain (HCC), Arthritis, Body mass index (BMI) of 24.0-24.9 in adult, Cardiovascular disease, Colostomy present (HCC), Current every day smoker, Depression, GERD (gastroesophageal reflux disease), Headache, History of rectal cancer, History of UTI, Hypertension, Midsternal chest pain, Personal history of chemotherapy (2017), Personal history of radiation therapy (2017), Port-A-Cath in place, Rectal cancer (HCC) (2017), Stroke [facility]), Syncope and collapse, and Thyroid nodule (2018). Surgical: Rachel [name]  has a past surgical history that includes Partial hysterectomy; ERCP; Colonoscopy with propofol (N/A, 08/15/2016); Portacath placement (N/A, 08/18/2016); Colonoscopy with propofol (N/A, 11/08/2016); laparoscopy (N/A, 11/23/2016); Colostomy (N/A,  11/23/2016); Biopsy thyroid (Right, 11/16/2016); Abdominal perineal bowel  resection (N/A, 03/16/2017); Appendectomy (03/16/2017); Thyroid lobectomy (Right, 11/05/2017); Incision and drainage perirectal abscess (N/A, 04/15/2018); Back surgery (2000); Abdominal hysterectomy (1999); Cholecystectomy (1983); Colonoscopy with propofol (N/A, 08/20/2020); Colon surgery; Colonoscopy with propofol (N/A, 03/21/2023); and Pulse generator implant (N/A, 08/28/2023). Family: family history includes Breast cancer (age of onset: 14) in her mother; Heart disease in her paternal grandmother; Hyperlipidemia in her father; Prostate cancer in her brother; Stroke in her maternal grandfather.  Laboratory Chemistry Profile   Renal Lab Results  Component Value Date   BUN 12 08/20/2023   CREATININE 0.68 08/20/2023   BCR 16 03/08/2017   GFRAA >60 08/29/2019   GFRNONAA >60 08/20/2023    Hepatic Lab Results  Component Value Date   AST 20 08/29/2019   ALT 14 08/29/2019   ALBUMIN 3.7 08/29/2019   ALKPHOS 131 (H) 08/29/2019   LIPASE 174 07/22/2014    Electrolytes Lab Results  Component Value Date   NA 141 08/20/2023   K 3.7 08/20/2023   CL 99 08/20/2023   CALCIUM 9.2 08/20/2023   MG 2.1 06/11/2019    Bone No results found for: "VD25OH", "VD125OH2TOT", "ZO1096EA5", "WU9811BJ4", "25OHVITD1", "25OHVITD2", "25OHVITD3", "TESTOFREE", "TESTOSTERONE"  Inflammation (CRP: Acute Phase) (ESR: Chronic Phase) No results found for: "CRP", "ESRSEDRATE", "LATICACIDVEN"       Note: Above Lab results reviewed.  Recent Imaging Review  DG Lumbar Spine 2-3 Views CLINICAL DATA:  Elective surgery.  EXAM: LUMBAR SPINE - 2-3 VIEW  COMPARISON:  Thoracic MRI 05/20/2023  FINDINGS: Two fluoroscopic spot views of the thoracolumbar spine obtained in the operating room. Spinal stimulator with lead tips at the T7 level. Fluoroscopy time 6 minutes 15 seconds. Dose is 65 mGy.  IMPRESSION: Intraoperative fluoroscopy.  Spinal  stimulator in place.  Electronically Signed   By: Rachel [name] M.D.   On: 08/28/2023 14:00 DG C-Arm 1-60 Min-No Report Fluoroscopy was utilized by the requesting physician.  No radiographic  interpretation.  Note: Reviewed        Physical Exam  General appearance: Well nourished, well developed, and well hydrated. In no apparent acute distress Mental status: Alert, oriented x 3 (person, place, & time)       Respiratory: No evidence of acute respiratory distress Eyes: PERLA Vitals: BP (!) 141/89   Pulse 95   Temp (!) 97.2 F (36.2 C) (Temporal)   Resp 14   Ht 5\' 6"  (1.676 m)   Wt 185 lb (83.9 kg)   SpO2 95%   BMI 29.86 kg/m  BMI: Estimated body mass index is 29.86 kg/m as calculated from the following:   Height as of this encounter: 5\' 6"  (1.676 m).   Weight as of this encounter: 185 lb (83.9 kg). Ideal: Ideal body weight: 59.3 kg (130 lb 11.7 oz) Adjusted ideal body weight: 69.1 kg (152 lb 7 oz)  Assessment   Diagnosis Status  1. Lumbar post-laminectomy syndrome   2. Failed back surgical syndrome   3. Chronic pain syndrome    Controlled Controlled Controlled    Plan of Care  Follow-up in 4 weeks to see how patient is doing with spinal cord stimulation.  Follow-up plan:   Return in about 4 weeks (around 10/04/2023) for follow up.       Recent Visits Date Type Provider Dept  08/14/23 Office Visit Rachel Jolly, MD [facility]  06/26/23 Procedure visit Rachel Jolly, MD [facility]  06/18/23 Procedure visit Rachel Jolly, MD [facility]  Showing recent visits within past 90 days and  meeting all other requirements Today's Visits Date Type Provider Dept  09/06/23 Office Visit Rachel Jolly, MD [facility]  Showing today's visits and meeting all other requirements Future Appointments No visits were found meeting these conditions. Showing future appointments within next 90 days and meeting all other requirements  I  discussed the assessment and treatment plan with the patient. The patient was provided an opportunity to ask questions and all were answered. The patient agreed with the plan and demonstrated an understanding of the instructions.  Patient advised to call back or seek an in-person evaluation if the symptoms or condition worsens.  Duration of encounter: .  Total time on encounter, as per AMA guidelines included both the face-to-face and non-face-to-face time personally spent by the physician and/or other qualified health care professional(s) on the day of the encounter (includes time in activities that require the physician or other qualified health care professional and does not include time in activities normally performed by clinical staff). Physician's time may include the following activities when performed: Preparing to see the patient (e.g., pre-charting review of records, searching for previously ordered imaging, lab work, and nerve conduction tests) Review of prior analgesic pharmacotherapies. Reviewing PMP Interpreting ordered tests (e.g., lab work, imaging, nerve conduction tests) Performing post-procedure evaluations, including interpretation of diagnostic procedures Obtaining and/or reviewing separately obtained history Performing a medically appropriate examination and/or evaluation Counseling and educating the patient/family/caregiver Ordering medications, tests, or procedures Referring and communicating with other health care professionals (when not separately reported) Documenting clinical information in the electronic or other health record Independently interpreting results (not separately reported) and communicating results to the patient/ family/caregiver Care coordination (not separately reported)  Note by: Rachel Jolly, MD Date: 09/06/2023; Time: 10:14 AM

## 2023-10-16 ENCOUNTER — Ambulatory Visit: Payer: Medicare PPO | Admitting: Student in an Organized Health Care Education/Training Program

## 2023-10-25 ENCOUNTER — Ambulatory Visit
Payer: Medicare PPO | Attending: Student in an Organized Health Care Education/Training Program | Admitting: Student in an Organized Health Care Education/Training Program

## 2023-10-25 ENCOUNTER — Encounter: Payer: Self-pay | Admitting: Student in an Organized Health Care Education/Training Program

## 2023-10-25 VITALS — BP 115/87 | HR 85 | Temp 97.0°F | Resp 16 | Ht 66.0 in | Wt 185.0 lb

## 2023-10-25 DIAGNOSIS — G894 Chronic pain syndrome: Secondary | ICD-10-CM | POA: Diagnosis present

## 2023-10-25 DIAGNOSIS — M961 Postlaminectomy syndrome, not elsewhere classified: Secondary | ICD-10-CM | POA: Insufficient documentation

## 2023-10-25 DIAGNOSIS — M5416 Radiculopathy, lumbar region: Secondary | ICD-10-CM | POA: Insufficient documentation

## 2023-10-25 MED ORDER — TRAMADOL HCL 50 MG PO TABS: 50.0000 mg | ORAL_TABLET | Freq: Three times a day (TID) | ORAL | 2 refills | Status: DC | PRN

## 2023-10-25 NOTE — Progress Notes (Signed)
PROVIDER NOTE: Information contained herein reflects review and annotations entered in association with encounter. Interpretation of such information and data should be left to medically-trained personnel. Information provided to patient can be located elsewhere in the medical record under "Patient Instructions". Document created using STT-dictation technology, any transcriptional errors that may result from process are unintentional.    Patient: Rachel [name]  Service Category: E/M  Provider: Edward Jolly, MD  DOB: June 17, 1956  DOS: 10/25/2023  Referring Provider: Dortha Kern, MD  MRN: [id number]  Specialty: Interventional Pain Management  PCP: Dortha Kern, MD  Type: Established Patient  Setting: Ambulatory outpatient    Location: Office  Delivery: Face-to-face     HPI  Ms. Rachel COLUMBO, a 67 y.o. year old female, is here today because of her Lumbar post-laminectomy syndrome [M96.1]. Ms. Volmer primary complain today is Back Pain  Pain Assessment: Severity of Chronic pain is reported as a 4 /10. Location: Back Lower/alternating pain to right or left upper thigh. Onset: More than a month ago. Quality: Crushing. Timing: Constant. Modifying factor(s): sitting, Tamadol has helped in the past; pt states SCS not effective, does not relieve pain. pt states she has been in communication with Adam of [employer], but still no relief. Pt states she will reach out to him again.. Vitals:  height is 5\' 6"  (1.676 m) and weight is 185 lb (83.9 kg). Her temperature is 97 F (36.1 C) (abnormal). Her blood pressure is 115/87 and her pulse is 85. Her respiration is 16 and oxygen saturation is 98%.  BMI: Estimated body mass index is 29.86 kg/m as calculated from the following:   Height as of this encounter: 5\' 6"  (1.676 m).   Weight as of this encounter: 185 lb (83.9 kg). Last encounter: 09/06/2023. Last procedure: 06/26/2023.  Reason for encounter: follow-up evaluation. Also for medication  management.  Patient states that her IPG site is sore Is not receiving as much benefit with SCS as she had hoped, she states that she got better benefit during her trial States that IPG site gets warm when she is charging it She has been taking care of her father who was recently placed on hospice and has moved to her home I encouraged her to reach out to Adam to optimize  programming and discuss the IPG getting warm during charging Did not find benefit with Hydrocodone, she states that she got better relief with Tramadol in the past. I will also get a thoracic xray to confirm lead location   Pharmacotherapy Assessment  Analgesic: start Tramadol 50 mg TID prn  Monitoring: [place] PMP: PDMP reviewed during this encounter.       Pharmacotherapy: No side-effects or adverse reactions reported. Compliance: No problems identified. Effectiveness: Clinically acceptable.  Nonah Mattes, RN  10/25/2023  2:25 PM  Sign when Signing Visit Safety precautions to be maintained throughout the outpatient stay will include: orient to surroundings, keep bed in low position, maintain call bell within reach at all times, provide assistance with transfer out of bed and ambulation.     No results found for: "CBDTHCR" No results found for: "D8THCCBX" No results found for: "D9THCCBX"  UDS:  Summary  Date Value Ref Range Status  06/26/2023 Note  Final    Comment:    ==================================================================== Compliance Drug Analysis, Ur ==================================================================== Specimen Alert Not Detected result may be consistent with the time of last use noted for this medication. AS NEEDED (Hydrocodone) ==================================================================== Test  Result       Flag       Units  Drug Present and Declared for Prescription Verification   Venlafaxine                    PRESENT      EXPECTED    Desmethylvenlafaxine           PRESENT      EXPECTED    Desmethylvenlafaxine is an expected metabolite of venlafaxine.    Acetaminophen                  PRESENT      EXPECTED  Drug Absent but Declared for Prescription Verification   Hydrocodone                    Not Detected UNEXPECTED ng/mg creat   Pregabalin                     Not Detected UNEXPECTED ==================================================================== Test                      Result    Flag   Units      Ref Range   Creatinine              78               mg/dL      >=63 ==================================================================== Declared Medications:  The flagging and interpretation on this report are based on the  following declared medications.  Unexpected results may arise from  inaccuracies in the declared medications.   **Note: The testing scope of this panel includes these medications:   Hydrocodone (Norco)  Pregabalin (Lyrica)  Venlafaxine (Effexor)   **Note: The testing scope of this panel does not include small to  moderate amounts of these reported medications:   Acetaminophen (Tylenol)  Acetaminophen (Norco)   **Note: The testing scope of this panel does not include the  following reported medications:   Amlodipine (Norvasc)  Atorvastatin (Lipitor)  Nitroglycerin (Nitrostat)  Omeprazole (Prilosec)  Solifenacin (Vesicare) ==================================================================== For clinical consultation, please call [phone]. ====================================================================       ROS  Constitutional: Denies any fever or chills Gastrointestinal: No reported hemesis, hematochezia, vomiting, or acute GI distress Musculoskeletal:  low back and right leg pain Neurological: No reported episodes of acute onset apraxia, aphasia, dysarthria, agnosia, amnesia, paralysis, loss of coordination, or loss of consciousness  Medication Review   HYDROcodone-acetaminophen, acetaminophen, amLODipine, atorvastatin, nitroGLYCERIN, omeprazole, pregabalin, solifenacin, traMADol, and venlafaxine  History Review  Allergy: Ms. Spittler is allergic to prednisone. Drug: Ms. Buller  reports no history of drug use. Alcohol:  reports no history of alcohol use. Tobacco:  reports that she has been smoking cigarettes. She has never used smokeless tobacco. Social: Ms. Balta  reports that she has been smoking cigarettes. She has never used smokeless tobacco. She reports that she does not drink alcohol and does not use drugs. Medical:  has a past medical history of Anginal pain (HCC), Arthritis, Body mass index (BMI) of 24.0-24.9 in adult, Cardiovascular disease, Colostomy present (HCC), Current every day smoker, Depression, GERD (gastroesophageal reflux disease), Headache, History of rectal cancer, History of UTI, Hypertension, Midsternal chest pain, Personal history of chemotherapy (2017), Personal history of radiation therapy (2017), Port-A-Cath in place, Rectal cancer (HCC) (2017), Stroke [facility]), Syncope and collapse, and Thyroid nodule (2018). Surgical: Ms. Vanhorn  has a past surgical history that includes Partial hysterectomy; ERCP;  Colonoscopy with propofol (N/A, 08/15/2016); Portacath placement (N/A, 08/18/2016); Colonoscopy with propofol (N/A, 11/08/2016); laparoscopy (N/A, 11/23/2016); Colostomy (N/A, 11/23/2016); Biopsy thyroid (Right, 11/16/2016); Abdominal perineal bowel resection (N/A, 03/16/2017); Appendectomy (03/16/2017); Thyroid lobectomy (Right, 11/05/2017); Incision and drainage perirectal abscess (N/A, 04/15/2018); Back surgery (2000); Abdominal hysterectomy (1999); Cholecystectomy (1983); Colonoscopy with propofol (N/A, 08/20/2020); Colon surgery; Colonoscopy with propofol (N/A, 03/21/2023); and Pulse generator implant (N/A, 08/28/2023). Family: family history includes Breast cancer (age of onset: 49) in her mother; Heart disease in her paternal  grandmother; Hyperlipidemia in her father; Prostate cancer in her brother; Stroke in her maternal grandfather.  Laboratory Chemistry Profile   Renal Lab Results  Component Value Date   BUN 12 08/20/2023   CREATININE 0.68 08/20/2023   BCR 16 03/08/2017   GFRAA >60 08/29/2019   GFRNONAA >60 08/20/2023    Hepatic Lab Results  Component Value Date   AST 20 08/29/2019   ALT 14 08/29/2019   ALBUMIN 3.7 08/29/2019   ALKPHOS 131 (H) 08/29/2019   LIPASE 174 07/22/2014    Electrolytes Lab Results  Component Value Date   NA 141 08/20/2023   K 3.7 08/20/2023   CL 99 08/20/2023   CALCIUM 9.2 08/20/2023   MG 2.1 06/11/2019    Bone No results found for: "VD25OH", "VD125OH2TOT", "ZO1096EA5", "WU9811BJ4", "25OHVITD1", "25OHVITD2", "25OHVITD3", "TESTOFREE", "TESTOSTERONE"  Inflammation (CRP: Acute Phase) (ESR: Chronic Phase) No results found for: "CRP", "ESRSEDRATE", "LATICACIDVEN"       Note: Above Lab results reviewed.  Recent Imaging Review  DG Lumbar Spine 2-3 Views CLINICAL DATA:  Elective surgery.  EXAM: LUMBAR SPINE - 2-3 VIEW  COMPARISON:  Thoracic MRI 05/20/2023  FINDINGS: Two fluoroscopic spot views of the thoracolumbar spine obtained in the operating room. Spinal stimulator with lead tips at the T7 level. Fluoroscopy time 6 minutes 15 seconds. Dose is 65 mGy.  IMPRESSION: Intraoperative fluoroscopy.  Spinal stimulator in place.  Electronically Signed   By: Narda Rutherford M.D.   On: 08/28/2023 14:00 DG C-Arm 1-60 Min-No Report Fluoroscopy was utilized by the requesting physician.  No radiographic  interpretation.  Note: Reviewed        Physical Exam  General appearance: Well nourished, well developed, and well hydrated. In no apparent acute distress Mental status: Alert, oriented x 3 (person, place, & time)       Respiratory: No evidence of acute respiratory distress Eyes: PERLA Vitals: BP 115/87   Pulse 85   Temp (!) 97 F (36.1 C)   Resp 16   Ht 5'  6" (1.676 m)   Wt 185 lb (83.9 kg)   SpO2 98%   BMI 29.86 kg/m  BMI: Estimated body mass index is 29.86 kg/m as calculated from the following:   Height as of this encounter: 5\' 6"  (1.676 m).   Weight as of this encounter: 185 lb (83.9 kg). Ideal: Ideal body weight: 59.3 kg (130 lb 11.7 oz) Adjusted ideal body weight: 69.1 kg (152 lb 7 oz)  Incision appears clean, dry, slightly red but the midline incision is non tender IPG site is CDI with slight tenderness on palpation but incision well approximated and intact  Assessment   Diagnosis  1. Lumbar post-laminectomy syndrome   2. Failed back surgical syndrome   3. Lumbar radicular pain   4. Chronic pain syndrome      Updated Problems: No problems updated.  Plan of Care  Problem-specific:  No problem-specific Assessment & Plan notes found for this encounter.  Ms. TANESA BROUS has  a current medication list which includes the following long-term medication(s): nitroglycerin, venlafaxine, and pregabalin.  Pharmacotherapy (Medications Ordered): Meds ordered this encounter  Medications   traMADol (ULTRAM) 50 MG tablet    Sig: Take 1 tablet (50 mg total) by mouth every 8 (eight) hours as needed for severe pain (pain score 7-10).    Dispense:  90 tablet    Refill:  2    Fill one day early if pharmacy is closed on scheduled refill date.   Orders:  Orders Placed This Encounter  Procedures   DG Thoracic Spine 2 View    Patient presents with axial pain with possible radicular component. Please assist [place] in identifying specific level(s) and laterality of any additional findings such as: 1. Facet (Zygapophyseal) joint DJD (Hypertrophy, space narrowing, subchondral sclerosis, and/or osteophyte formation) 2. DDD and/or IVDD (Loss of disc height, desiccation, gas patterns, osteophytes, endplate sclerosis, or "Black disc disease") 3. Pars defects 4. Spondylolisthesis, spondylosis, and/or spondyloarthropathies (include Degree/Grade of  displacement in mm) (stability) 5. Vertebral body Fractures (acute/chronic) (state percentage of collapse) 6. Demineralization (osteopenia/osteoporotic) 7. Bone pathology 8. Foraminal narrowing  9. Surgical changes    Standing Status:   Future    Standing Expiration Date:   01/25/2024    Scheduling Instructions:     Please make sure that the patient understands that this needs to be done as soon as possible. Never have the patient do the imaging "just before the next appointment". Inform patient that having the imaging done within the [facility] will expedite the availability of the results and will provide      imaging availability to the requesting physician. In addition inform the patient that the imaging order has an expiration date and will not be renewed if not done within the active period.    Order Specific Question:   Reason for Exam (SYMPTOM  OR DIAGNOSIS REQUIRED)    Answer:   Upper back pain and/or thoracic spine pain.    Order Specific Question:   Preferred imaging location?    Answer:    [facility]    Order Specific Question:   Call Results- Best Contact Number?    Answer:   [phone] [facility])   Follow-up plan:   Return in about 3 months (around 01/25/2024) for MM, F2F.     Recent Visits Date Type Provider Dept  09/06/23 Office Visit Edward Jolly, MD [facility]  08/14/23 Office Visit Edward Jolly, MD [facility]  Showing recent visits within past 90 days and meeting all other requirements Today's Visits Date Type Provider Dept  10/25/23 Office Visit Edward Jolly, MD [facility]  Showing today's visits and meeting all other requirements Future Appointments No visits were found meeting these conditions. Showing future appointments within next 90 days and meeting all other requirements  I discussed the assessment and treatment plan with the patient. The patient was provided an opportunity to ask questions and all  were answered. The patient agreed with the plan and demonstrated an understanding of the instructions.  Patient advised to call back or seek an in-person evaluation if the symptoms or condition worsens.  Duration of encounter: 30 minutes.  Total time on encounter, as per AMA guidelines included both the face-to-face and non-face-to-face time personally spent by the physician and/or other qualified health care professional(s) on the day of the encounter (includes time in activities that require the physician or other qualified health care professional and does not include time in activities normally  performed by clinical staff). Physician's time may include the following activities when performed: Preparing to see the patient (e.g., pre-charting review of records, searching for previously ordered imaging, lab work, and nerve conduction tests) Review of prior analgesic pharmacotherapies. Reviewing PMP Interpreting ordered tests (e.g., lab work, imaging, nerve conduction tests) Performing post-procedure evaluations, including interpretation of diagnostic procedures Obtaining and/or reviewing separately obtained history Performing a medically appropriate examination and/or evaluation Counseling and educating the patient/family/caregiver Ordering medications, tests, or procedures Referring and communicating with other health care professionals (when not separately reported) Documenting clinical information in the electronic or other health record Independently interpreting results (not separately reported) and communicating results to the patient/ family/caregiver Care coordination (not separately reported)  Note by: Edward Jolly, MD Date: 10/25/2023; Time: 3:01 PM

## 2023-10-25 NOTE — Progress Notes (Signed)
Safety precautions to be maintained throughout the outpatient stay will include: orient to surroundings, keep bed in low position, maintain call bell within reach at all times, provide assistance with transfer out of bed and ambulation.  

## 2024-01-22 ENCOUNTER — Encounter: Payer: Self-pay | Admitting: Student in an Organized Health Care Education/Training Program

## 2024-01-22 ENCOUNTER — Ambulatory Visit
Payer: Medicare Other | Attending: Student in an Organized Health Care Education/Training Program | Admitting: Student in an Organized Health Care Education/Training Program

## 2024-01-22 VITALS — BP 135/84 | HR 80 | Temp 97.0°F | Resp 16 | Ht 66.0 in | Wt 183.0 lb

## 2024-01-22 DIAGNOSIS — G894 Chronic pain syndrome: Secondary | ICD-10-CM | POA: Diagnosis present

## 2024-01-22 DIAGNOSIS — M5416 Radiculopathy, lumbar region: Secondary | ICD-10-CM | POA: Insufficient documentation

## 2024-01-22 DIAGNOSIS — M961 Postlaminectomy syndrome, not elsewhere classified: Secondary | ICD-10-CM | POA: Insufficient documentation

## 2024-01-22 MED ORDER — TRAMADOL HCL 50 MG PO TABS: 50.0000 mg | ORAL_TABLET | Freq: Two times a day (BID) | ORAL | 2 refills | Status: DC | PRN

## 2024-01-22 NOTE — Progress Notes (Signed)
Nursing Pain Medication Assessment:  Safety precautions to be maintained throughout the outpatient stay will include: orient to surroundings, keep bed in low position, maintain call bell within reach at all times, provide assistance with transfer out of bed and ambulation.  Medication Inspection Compliance: Pill count conducted under aseptic conditions, in front of the patient. Neither the pills nor the bottle was removed from the patient's sight at any time. Once count was completed pills were immediately returned to the patient in their original bottle.  Medication: Tramadol (Ultram) Pill/Patch Count:  17 of 90 pills remain Pill/Patch Appearance: Markings consistent with prescribed medication Bottle Appearance: Standard pharmacy container. Clearly labeled. Filled Date: 62 / 21 / 2024 Last Medication intake:  Today

## 2024-01-22 NOTE — Patient Instructions (Signed)

## 2024-01-22 NOTE — Progress Notes (Signed)
PROVIDER NOTE: Information contained herein reflects review and annotations entered in association with encounter. Interpretation of such information and data should be left to medically-trained personnel. Information provided to patient can be located elsewhere in the medical record under "Patient Instructions". Document created using STT-dictation technology, any transcriptional errors that may result from process are unintentional.    Patient: Rachel [name]  Service Category: E/M  Provider: Edward Jolly, MD  DOB: 12-Nov-1956  DOS: 01/22/2024  Referring Provider: Dortha Kern, MD  MRN: [id number]  Specialty: Interventional Pain Management  PCP: Dortha Kern, MD  Type: Established Patient  Setting: Ambulatory outpatient    Location: Office  Delivery: Face-to-face     HPI  [name], a 68 y.o. year old female, is here today because of her Lumbar post-laminectomy syndrome [M96.1]. Ms. Dunlevy primary complain today is Back Pain (Lumbar bilateral )  Pain Assessment: Severity of Chronic pain is reported as a 2 /10. Location: Back Lower, Left, Right/down both legs all the way down to the feet, feet feel as if they are asleep. Onset: More than a month ago. Quality: Discomfort, Aching, Throbbing (weakness in legs, when walking something catches and nearly causes a fall). Timing: Constant. Modifying factor(s): SCS, tramadol. Vitals:  height is 5\' 6"  (1.676 m) and weight is 183 lb (83 kg). Her temporal temperature is 97 F (36.1 C) (abnormal). Her blood pressure is 135/84 and her pulse is 80. Her respiration is 16 and oxygen saturation is 95%.  BMI: Estimated body mass index is 29.54 kg/m as calculated from the following:   Height as of this encounter: 5\' 6"  (1.676 m).   Weight as of this encounter: 183 lb (83 kg). Last encounter: 10/25/2023. Last procedure: 06/26/2023.  Reason for encounter: medication management.    History of Present Illness   Rachel [name] is a 68 year old female  who presents for follow-up regarding pain management with a stimulator and medication.  She experiences some relief from her pain with the combination of her medications and the spinal cord stimulator. She feels better overall compared to a year ago. However, her pain intensifies around noon if she is engaged in activities such as cleaning, necessitating a break to sit down before resuming her tasks.  She is currently taking tramadol, 50 mg, twice daily.       Pharmacotherapy Assessment  Analgesic: Tramadol 50 mg BID   Monitoring: [place] PMP: PDMP reviewed during this encounter.       Pharmacotherapy: No side-effects or adverse reactions reported. Compliance: No problems identified. Effectiveness: Clinically acceptable.  Rachel Ammons, RN  01/22/2024 10:46 AM  Sign when Signing Visit Nursing Pain Medication Assessment:  Safety precautions to be maintained throughout the outpatient stay will include: orient to surroundings, keep bed in low position, maintain call bell within reach at all times, provide assistance with transfer out of bed and ambulation.  Medication Inspection Compliance: Pill count conducted under aseptic conditions, in front of the patient. Neither the pills nor the bottle was removed from the patient's sight at any time. Once count was completed pills were immediately returned to the patient in their original bottle.  Medication: Tramadol (Ultram) Pill/Patch Count:  17 of 90 pills remain Pill/Patch Appearance: Markings consistent with prescribed medication Bottle Appearance: Standard pharmacy container. Clearly labeled. Filled Date: 92 / 21 / 2024 Last Medication intake:  Today  No results found for: "CBDTHCR" No results found for: "D8THCCBX" No results found for: "D9THCCBX"  UDS:  Summary  Date Value Ref Range Status  06/26/2023 Note  Final    Comment:    ==================================================================== Compliance Drug Analysis,  [place] ==================================================================== Specimen Alert Not Detected result may be consistent with the time of last use noted for this medication. AS NEEDED (Hydrocodone) ==================================================================== Test                             Result       Flag       Units  Drug Present and Declared for Prescription Verification   Venlafaxine                    PRESENT      EXPECTED   Desmethylvenlafaxine           PRESENT      EXPECTED    Desmethylvenlafaxine is an expected metabolite of venlafaxine.    Acetaminophen                  PRESENT      EXPECTED  Drug Absent but Declared for Prescription Verification   Hydrocodone                    Not Detected UNEXPECTED ng/mg creat   Pregabalin                     Not Detected UNEXPECTED ==================================================================== Test                      Result    Flag   Units      Ref Range   Creatinine              78               mg/dL      >=62 ==================================================================== Declared Medications:  The flagging and interpretation on this report are based on the  following declared medications.  Unexpected results may arise from  inaccuracies in the declared medications.   **Note: The testing scope of this panel includes these medications:   Hydrocodone (Norco)  Pregabalin (Lyrica)  Venlafaxine (Effexor)   **Note: The testing scope of this panel does not include small to  moderate amounts of these reported medications:   Acetaminophen (Tylenol)  Acetaminophen (Norco)   **Note: The testing scope of this panel does not include the  following reported medications:   Amlodipine (Norvasc)  Atorvastatin (Lipitor)  Nitroglycerin (Nitrostat)  Omeprazole (Prilosec)  Solifenacin (Vesicare) ==================================================================== For clinical consultation, please call [phone]. ====================================================================       ROS  Constitutional: Denies any fever or chills Gastrointestinal: No reported hemesis, hematochezia, vomiting, or acute GI distress Musculoskeletal:  LBP and leg pain Neurological: No reported episodes of acute onset apraxia, aphasia, dysarthria, agnosia, amnesia, paralysis, loss of coordination, or loss of consciousness  Medication Review  acetaminophen, amLODipine, atorvastatin, nitroGLYCERIN, omeprazole, solifenacin, traMADol, and venlafaxine  History Review  Allergy: Rachel [name] is allergic to prednisone. Drug: Rachel [name]  reports no history of drug use. Alcohol:  reports no history of alcohol use. Tobacco:  reports that she has been smoking cigarettes. She has never used smokeless tobacco. Social: Rachel [name]  reports that she has been smoking cigarettes. She has never used smokeless tobacco. She reports that she does not drink alcohol and does not use drugs. Medical:  has a past medical history of Anginal pain (HCC), Arthritis, Body mass index (  BMI) of 24.0-24.9 in adult, Cardiovascular disease, Colostomy present (HCC), Current every day smoker, Depression, GERD (gastroesophageal reflux disease), Headache, History of rectal cancer, History of UTI, Hypertension, Midsternal chest pain, Personal history of chemotherapy (2017), Personal history of radiation therapy (2017), Port-A-Cath in place, Rectal cancer (HCC) (2017), Stroke [facility]), Syncope and collapse, and Thyroid nodule (2018). Surgical: Rachel [name]  has a past surgical history that includes Partial hysterectomy; ERCP; Colonoscopy with propofol (N/A, 08/15/2016); Portacath placement (N/A, 08/18/2016); Colonoscopy with propofol (N/A, 11/08/2016); laparoscopy (N/A, 11/23/2016); Colostomy (N/A, 11/23/2016); Biopsy thyroid (Right, 11/16/2016); Abdominal perineal bowel resection (N/A, 03/16/2017); Appendectomy (03/16/2017); Thyroid lobectomy (Right, 11/05/2017);  Incision and drainage perirectal abscess (N/A, 04/15/2018); Back surgery (2000); Abdominal hysterectomy (1999); Cholecystectomy (1983); Colonoscopy with propofol (N/A, 08/20/2020); Colon surgery; Colonoscopy with propofol (N/A, 03/21/2023); and Pulse generator implant (N/A, 08/28/2023). Family: family history includes Breast cancer (age of onset: 36) in her mother; Heart disease in her paternal grandmother; Hyperlipidemia in her father; Prostate cancer in her brother; Stroke in her maternal grandfather.  Laboratory Chemistry Profile   Renal Lab Results  Component Value Date   BUN 12 08/20/2023   CREATININE 0.68 08/20/2023   BCR 16 03/08/2017   GFRAA >60 08/29/2019   GFRNONAA >60 08/20/2023    Hepatic Lab Results  Component Value Date   AST 20 08/29/2019   ALT 14 08/29/2019   ALBUMIN 3.7 08/29/2019   ALKPHOS 131 (H) 08/29/2019   LIPASE 174 07/22/2014    Electrolytes Lab Results  Component Value Date   NA 141 08/20/2023   K 3.7 08/20/2023   CL 99 08/20/2023   CALCIUM 9.2 08/20/2023   MG 2.1 06/11/2019    Bone No results found for: "VD25OH", "VD125OH2TOT", "NW2956OZ3", "YQ6578IO9", "25OHVITD1", "25OHVITD2", "25OHVITD3", "TESTOFREE", "TESTOSTERONE"  Inflammation (CRP: Acute Phase) (ESR: Chronic Phase) No results found for: "CRP", "ESRSEDRATE", "LATICACIDVEN"       Note: Above Lab results reviewed.  Recent Imaging Review  DG Lumbar Spine 2-3 Views CLINICAL DATA:  Elective surgery.  EXAM: LUMBAR SPINE - 2-3 VIEW  COMPARISON:  Thoracic MRI 05/20/2023  FINDINGS: Two fluoroscopic spot views of the thoracolumbar spine obtained in the operating room. Spinal stimulator with lead tips at the T7 level. Fluoroscopy time 6 minutes 15 seconds. Dose is 65 mGy.  IMPRESSION: Intraoperative fluoroscopy.  Spinal stimulator in place.  Electronically Signed   By: Narda Rutherford M.D.   On: 08/28/2023 14:00 DG C-Arm 1-60 Min-No Report Fluoroscopy was utilized by the requesting  physician.  No radiographic  interpretation.  Note: Reviewed        Physical Exam  General appearance: Well nourished, well developed, and well hydrated. In no apparent acute distress Mental status: Alert, oriented x 3 (person, place, & time)       Respiratory: No evidence of acute respiratory distress Eyes: PERLA Vitals: BP 135/84 (BP Location: Right Arm, Patient Position: Sitting, Cuff Size: Normal)   Pulse 80   Temp (!) 97 F (36.1 C) (Temporal)   Resp 16   Ht 5\' 6"  (1.676 m)   Wt 183 lb (83 kg)   SpO2 95%   BMI 29.54 kg/m  BMI: Estimated body mass index is 29.54 kg/m as calculated from the following:   Height as of this encounter: 5\' 6"  (1.676 m).   Weight as of this encounter: 183 lb (83 kg). Ideal: Ideal body weight: 59.3 kg (130 lb 11.7 oz) Adjusted ideal body weight: 68.8 kg (151 lb 10.2 oz)  Assessment   Diagnosis Status  1.  Lumbar post-laminectomy syndrome   2. Failed back surgical syndrome   3. Lumbar radicular pain   4. Chronic pain syndrome    Controlled Controlled Controlled   Updated Problems: No problems updated.  Plan of Care  Problem-specific:  Assessment and Plan    Chronic Pain Combination of medications and spinal cord stimulation provides relief. Pain intensifies around noon with activity, requiring rest. Currently, two tramadol daily are effective. Adjust tramadol to 60 tablets per month due to current usage and need for continued pain management. No more refills after the current prescription. Prescribe tramadol 50 mg every 12 hours with two refills. Schedule follow-up appointment in June or July.       Rachel [name] has a current medication list which includes the following long-term medication(s): nitroglycerin and venlafaxine.  Pharmacotherapy (Medications Ordered): Meds ordered this encounter  Medications   traMADol (ULTRAM) 50 MG tablet    Sig: Take 1 tablet (50 mg total) by mouth every 12 (twelve) hours as needed for severe  pain (pain score 7-10).    Dispense:  60 tablet    Refill:  2    Fill one day early if pharmacy is closed on scheduled refill date.   Orders:  No orders of the defined types were placed in this encounter.  Follow-up plan:   Return in about 4 months (around 05/28/2024) for MM, F2F.     Recent Visits Date Type Provider Dept  10/25/23 Office Visit Edward Jolly, MD [facility]  Showing recent visits within past 90 days and meeting all other requirements Today's Visits Date Type Provider Dept  01/22/24 Office Visit Edward Jolly, MD [facility]  Showing today's visits and meeting all other requirements Future Appointments No visits were found meeting these conditions. Showing future appointments within next 90 days and meeting all other requirements  I discussed the assessment and treatment plan with the patient. The patient was provided an opportunity to ask questions and all were answered. The patient agreed with the plan and demonstrated an understanding of the instructions.  Patient advised to call back or seek an in-person evaluation if the symptoms or condition worsens.  Duration of encounter: .  Total time on encounter, as per AMA guidelines included both the face-to-face and non-face-to-face time personally spent by the physician and/or other qualified health care professional(s) on the day of the encounter (includes time in activities that require the physician or other qualified health care professional and does not include time in activities normally performed by clinical staff). Physician's time may include the following activities when performed: Preparing to see the patient (e.g., pre-charting review of records, searching for previously ordered imaging, lab work, and nerve conduction tests) Review of prior analgesic pharmacotherapies. Reviewing PMP Interpreting ordered tests (e.g., lab work, imaging, nerve conduction tests) Performing  post-procedure evaluations, including interpretation of diagnostic procedures Obtaining and/or reviewing separately obtained history Performing a medically appropriate examination and/or evaluation Counseling and educating the patient/family/caregiver Ordering medications, tests, or procedures Referring and communicating with other health care professionals (when not separately reported) Documenting clinical information in the electronic or other health record Independently interpreting results (not separately reported) and communicating results to the patient/ family/caregiver Care coordination (not separately reported)  Note by: Edward Jolly, MD Date: 01/22/2024; Time: 11:11 AM

## 2024-05-20 ENCOUNTER — Ambulatory Visit: Attending: Nurse Practitioner | Admitting: Nurse Practitioner

## 2024-05-20 ENCOUNTER — Encounter: Payer: Medicare Other | Admitting: Student in an Organized Health Care Education/Training Program

## 2024-05-20 ENCOUNTER — Encounter: Payer: Self-pay | Admitting: Nurse Practitioner

## 2024-05-20 VITALS — BP 112/77 | HR 80 | Temp 97.3°F | Ht 66.0 in | Wt 178.0 lb

## 2024-05-20 DIAGNOSIS — M961 Postlaminectomy syndrome, not elsewhere classified: Secondary | ICD-10-CM | POA: Diagnosis not present

## 2024-05-20 DIAGNOSIS — M5416 Radiculopathy, lumbar region: Secondary | ICD-10-CM | POA: Insufficient documentation

## 2024-05-20 DIAGNOSIS — G894 Chronic pain syndrome: Secondary | ICD-10-CM | POA: Diagnosis not present

## 2024-05-20 DIAGNOSIS — Z79899 Other long term (current) drug therapy: Secondary | ICD-10-CM | POA: Diagnosis not present

## 2024-05-20 MED ORDER — TRAMADOL HCL 50 MG PO TABS: 50.0000 mg | ORAL_TABLET | Freq: Three times a day (TID) | ORAL | 2 refills | Status: AC | PRN

## 2024-05-20 NOTE — Progress Notes (Signed)
 Safety precautions to be maintained throughout the outpatient stay will include: orient to surroundings, keep bed in low position, maintain call bell within reach at all times, provide assistance with transfer out of bed and ambulation.    Nursing Pain Medication Assessment:  Safety precautions to be maintained throughout the outpatient stay will include: orient to surroundings, keep bed in low position, maintain call bell within reach at all times, provide assistance with transfer out of bed and ambulation.  Medication Inspection Compliance: Pill count conducted under aseptic conditions, in front of the patient. Neither the pills nor the bottle was removed from the patient's sight at any time. Once count was completed pills were immediately returned to the patient in their original bottle.  Medication: Tramadol  (Ultram ) Pill/Patch Count: 0 of 60 pills/patches remain Pill/Patch Appearance: Markings consistent with prescribed medication Bottle Appearance: Standard pharmacy container. Clearly labeled. Filled Date: 4 / 62 / 2025 Last Medication intake:  Ran out of medicine more than 48 hours ago

## 2024-05-20 NOTE — Progress Notes (Signed)
 PROVIDER NOTE: Interpretation of information contained herein should be left to medically-trained personnel. Specific patient instructions are provided elsewhere under "Patient Instructions" section of medical record. This document was created in part using AI and STT-dictation technology, any transcriptional errors that may result from this process are unintentional.  Patient: Rachel [name]  Service: E/M   PCP: Claudine Cullens, MD  DOB: [birth year]  DOS: 05/20/2024  Provider: Cherylin Corrigan, NP  MRN: [id number]  Delivery: Face-to-face  Specialty: Interventional Pain Management  Type: Established Patient  Setting: Ambulatory outpatient facility  Specialty designation: 09  Referring Prov.: Claudine Cullens, MD  Location: Outpatient office facility       History of present illness (HPI) [name], a 68 y.o. year old female, is here today because of her Medication management [Z79.899]. [name] primary complain today is Back Pain (lower)   Pain Assessment: Severity of Chronic pain is reported as a 5 /10. Location: Back Lower/radiates down both legs to calves. Onset: More than a month ago. Quality: Throbbing. Timing: Constant. Modifying factor(s): sitting down, meds. Vitals:  height is 5\' 6"  (1.676 m) and weight is 178 lb (80.7 kg). Her temperature is 97.3 F (36.3 C) (abnormal). Her blood pressure is 112/77 and her pulse is 80. Her oxygen saturation is 97%.  BMI: Estimated body mass index is 28.73 kg/m as calculated from the following:   Height as of this encounter: 5\' 6"  (1.676 m).   Weight as of this encounter: 178 lb (80.7 kg).  Last encounter: 01/22/2024 Last procedure: 06/18/2023  Reason for encounter: medication management. No change in medical history since last visit.  Patient's pain is at baseline.  Patient continues multimodal pain regimen as prescribed.  States that it provides pain relief and improvement in functional status.   The patient reports taking tramadol  50 mg up to 3  times due to pain and requests an increase in the dose.  I discussed with the patient that I will increase the frequency of the tramadol  administration but emphasized that this should be taken on an as needed basis.  Pharmacotherapy Assessment   Analgesic: Tramadol  (Ultram ) 50 mg tablet every 8 hours as needed for severe pain. MME=20 Monitoring: [place] PMP: PDMP reviewed during this encounter.       Pharmacotherapy: No side-effects or adverse reactions reported. Compliance: No problems identified. Effectiveness: Clinically acceptable.  Merilyn Staple, RN  05/20/2024  9:59 AM  Sign when Signing Visit Safety precautions to be maintained throughout the outpatient stay will include: orient to surroundings, keep bed in low position, maintain call bell within reach at all times, provide assistance with transfer out of bed and ambulation.    Nursing Pain Medication Assessment:  Safety precautions to be maintained throughout the outpatient stay will include: orient to surroundings, keep bed in low position, maintain call bell within reach at all times, provide assistance with transfer out of bed and ambulation.  Medication Inspection Compliance: Pill count conducted under aseptic conditions, in front of the patient. Neither the pills nor the bottle was removed from the patient's sight at any time. Once count was completed pills were immediately returned to the patient in their original bottle.  Medication: Tramadol  (Ultram ) Pill/Patch Count: 0 of 60 pills/patches remain Pill/Patch Appearance: Markings consistent with prescribed medication Bottle Appearance: Standard pharmacy container. Clearly labeled. Filled Date: 4 / 11 / 2025 Last Medication intake:  Ran out of medicine more than 48 hours ago      UDS:  Summary  Date Value Ref Range Status  06/26/2023 Note  Final    Comment:    ==================================================================== Compliance Drug Analysis,  [place] ==================================================================== Specimen Alert Not Detected result may be consistent with the time of last use noted for this medication. AS NEEDED (Hydrocodone ) ==================================================================== Test                             Result       Flag       Units  Drug Present and Declared for Prescription Verification   Venlafaxine                     PRESENT      EXPECTED   Desmethylvenlafaxine           PRESENT      EXPECTED    Desmethylvenlafaxine is an expected metabolite of venlafaxine .    Acetaminophen                   PRESENT      EXPECTED  Drug Absent but Declared for Prescription Verification   Hydrocodone                     Not Detected UNEXPECTED ng/mg creat   Pregabalin                      Not Detected UNEXPECTED ==================================================================== Test                      Result    Flag   Units      Ref Range   Creatinine              78               mg/dL      >=96 ==================================================================== Declared Medications:  The flagging and interpretation on this report are based on the  following declared medications.  Unexpected results may arise from  inaccuracies in the declared medications.   **Note: The testing scope of this panel includes these medications:   Hydrocodone  (Norco)  Pregabalin  (Lyrica )  Venlafaxine  (Effexor )   **Note: The testing scope of this panel does not include small to  moderate amounts of these reported medications:   Acetaminophen  (Tylenol )  Acetaminophen  (Norco)   **Note: The testing scope of this panel does not include the  following reported medications:   Amlodipine  (Norvasc )  Atorvastatin  (Lipitor)  Nitroglycerin  (Nitrostat )  Omeprazole (Prilosec)  Solifenacin (Vesicare) ==================================================================== For clinical consultation, please call [phone]. ====================================================================     No results found for: "CBDTHCR" No results found for: "D8THCCBX" No results found for: "D9THCCBX"  ROS  Constitutional: Denies any fever or chills Gastrointestinal: No reported hemesis, hematochezia, vomiting, or acute GI distress Musculoskeletal: Bilateral lower back pain Neurological: No reported episodes of acute onset apraxia, aphasia, dysarthria, agnosia, amnesia, paralysis, loss of coordination, or loss of consciousness  Medication Review  acetaminophen , amLODipine , atorvastatin , nitroGLYCERIN , omeprazole, solifenacin, traMADol , and venlafaxine   History Review  Allergy: [name] is allergic to prednisone. Drug: [name]  reports no history of drug use. Alcohol:  reports no history of alcohol use. Tobacco:  reports that she has been smoking cigarettes. She has never used smokeless tobacco. Social: [name]  reports that she has been smoking cigarettes. She has never used smokeless tobacco. She reports that she does not drink alcohol and does not use drugs. Medical:  has a past medical history of Anginal pain (HCC), Arthritis, Body mass index (BMI) of 24.0-24.9 in adult, Cardiovascular disease, Colostomy present (HCC), Current every day smoker, Depression, GERD (gastroesophageal reflux disease), Headache, History of rectal cancer, History of UTI, Hypertension, Midsternal chest pain, Personal history of chemotherapy (2017), Personal history of radiation therapy (2017), Port-A-Cath in place, Rectal cancer (HCC) (2017), Stroke [facility]), Syncope and collapse, and Thyroid  nodule (2018). Surgical: Rachel [name]  has a past surgical history that includes Partial hysterectomy; ERCP; Colonoscopy with propofol  (N/A, 08/15/2016); Portacath placement (N/A, 08/18/2016); Colonoscopy with propofol  (N/A, 11/08/2016); laparoscopy (N/A, 11/23/2016); Colostomy (N/A, 11/23/2016); Biopsy thyroid  (Right, 11/16/2016); Abdominal  perineal bowel resection (N/A, 03/16/2017); Appendectomy (03/16/2017); Thyroid  lobectomy (Right, 11/05/2017); Incision and drainage perirectal abscess (N/A, 04/15/2018); Back surgery (2000); Abdominal hysterectomy (1999); Cholecystectomy (1983); Colonoscopy with propofol  (N/A, 08/20/2020); Colon surgery; Colonoscopy with propofol  (N/A, 03/21/2023); and Pulse generator implant (N/A, 08/28/2023). Family: family history includes Breast cancer (age of onset: 48) in her mother; Heart disease in her paternal grandmother; Hyperlipidemia in her father; Prostate cancer in her brother; Stroke in her maternal grandfather.  Laboratory Chemistry Profile   Renal Lab Results  Component Value Date   BUN 12 08/20/2023   CREATININE 0.68 08/20/2023   BCR 16 03/08/2017   GFRAA >60 08/29/2019   GFRNONAA >60 08/20/2023    Hepatic Lab Results  Component Value Date   AST 20 08/29/2019   ALT 14 08/29/2019   ALBUMIN 3.7 08/29/2019   ALKPHOS 131 (H) 08/29/2019   LIPASE 174 07/22/2014    Electrolytes Lab Results  Component Value Date   NA 141 08/20/2023   K 3.7 08/20/2023   CL 99 08/20/2023   CALCIUM  9.2 08/20/2023   MG 2.1 06/11/2019    Bone No results found for: "VD25OH", "VD125OH2TOT", "ZO1096EA5", "WU9811BJ4", "25OHVITD1", "25OHVITD2", "25OHVITD3", "TESTOFREE", "TESTOSTERONE"  Inflammation (CRP: Acute Phase) (ESR: Chronic Phase) No results found for: "CRP", "ESRSEDRATE", "LATICACIDVEN"       Note: Above Lab results reviewed.  Recent Imaging Review  DG Lumbar Spine 2-3 Views CLINICAL DATA:  Elective surgery.  EXAM: LUMBAR SPINE - 2-3 VIEW  COMPARISON:  Thoracic MRI 05/20/2023  FINDINGS: Two fluoroscopic spot views of the thoracolumbar spine obtained in the operating room. Spinal stimulator with lead tips at the T7 level. Fluoroscopy time 6 minutes 15 seconds. Dose is 65 mGy.  IMPRESSION: Intraoperative fluoroscopy.  Spinal stimulator in place.  Electronically Signed   By: Chadwick Colonel M.D.   On: 08/28/2023 14:00 DG C-Arm 1-60 Min-No Report Fluoroscopy was utilized by the requesting physician.  No radiographic  interpretation.  Note: Reviewed         Physical Exam  General appearance: Well nourished, well developed, and well hydrated. In no apparent acute distress Mental status: Alert, oriented x 3 (person, place, & time)       Respiratory: No evidence of acute respiratory distress Eyes: PERLA Vitals: BP 112/77   Pulse 80   Temp (!) 97.3 F (36.3 C)   Ht 5\' 6"  (1.676 m)   Wt 178 lb (80.7 kg)   SpO2 97%   BMI 28.73 kg/m  BMI: Estimated body mass index is 28.73 kg/m as calculated from the following:   Height as of this encounter: 5\' 6"  (1.676 m).   Weight as of this encounter: 178 lb (80.7 kg). Ideal: Ideal body weight: 59.3 kg (130 lb 11.7 oz) Adjusted ideal body weight: 67.9 kg (149 lb 10.2 oz)  Assessment   Diagnosis Status  1. Medication  management   2. Lumbar post-laminectomy syndrome   3. Failed back surgical syndrome   4. Lumbar radicular pain   5. Chronic pain syndrome    Controlled Controlled Controlled   Updated Problems: No problems updated.  Plan of Care  Problem-specific:  Assessment and Plan Updated Medication tramadol  50 Mg tablet every 8 hours as needed for severe pain.    Prescribing drug monitoring (PDMP) reviewed findings consistent with the use of prescribed medication and no evidence of narcotic misuse or abuse.  Urine drug screening (UDS) up-to-date.  No other new issues or problems reported to this visit.  Schedule follow-up in 90 days for medication management.  Rachel [name] has a current medication list which includes the following long-term medication(s): nitroglycerin  and venlafaxine .  Pharmacotherapy (Medications Ordered): Meds ordered this encounter  Medications   traMADol  (ULTRAM ) 50 MG tablet    Sig: Take 1 tablet (50 mg total) by mouth every 8 (eight) hours as needed for severe pain (pain score  7-10).    Dispense:  60 tablet    Refill:  2    Fill one day early if pharmacy is closed on scheduled refill date.   Orders:  No orders of the defined types were placed in this encounter.       Return in about 3 months (around 08/20/2024) for (F2F), (MM), Marthe Slain NP.    Recent Visits No visits were found meeting these conditions. Showing recent visits within past 90 days and meeting all other requirements Today's Visits Date Type Provider Dept  05/20/24 Office Visit Adilynne Fitzwater K, NP [facility]  Showing today's visits and meeting all other requirements Future Appointments No visits were found meeting these conditions. Showing future appointments within next 90 days and meeting all other requirements  I discussed the assessment and treatment plan with the patient. The patient was provided an opportunity to ask questions and all were answered. The patient agreed with the plan and demonstrated an understanding of the instructions.  Patient advised to call back or seek an in-person evaluation if the symptoms or condition worsens.  Duration of encounter: 30 minutes.  Total time on encounter, as per AMA guidelines included both the face-to-face and non-face-to-face time personally spent by the physician and/or other qualified health care professional(s) on the day of the encounter (includes time in activities that require the physician or other qualified health care professional and does not include time in activities normally performed by clinical staff). Physician's time may include the following activities when performed: Preparing to see the patient (e.g., pre-charting review of records, searching for previously ordered imaging, lab work, and nerve conduction tests) Review of prior analgesic pharmacotherapies. Reviewing PMP Interpreting ordered tests (e.g., lab work, imaging, nerve conduction tests) Performing post-procedure evaluations, including interpretation of  diagnostic procedures Obtaining and/or reviewing separately obtained history Performing a medically appropriate examination and/or evaluation Counseling and educating the patient/family/caregiver Ordering medications, tests, or procedures Referring and communicating with other health care professionals (when not separately reported) Documenting clinical information in the electronic or other health record Independently interpreting results (not separately reported) and communicating results to the patient/ family/caregiver Care coordination (not separately reported)  Note by: Armandina Iman K Dupree Givler, NP (TTS and AI technology used. I apologize for any typographical errors that were not detected and corrected.) Date: 05/20/2024; Time: 10:42 AM

## 2024-08-20 ENCOUNTER — Encounter: Payer: Self-pay | Admitting: Nurse Practitioner

## 2024-08-20 ENCOUNTER — Ambulatory Visit: Attending: Nurse Practitioner | Admitting: Nurse Practitioner

## 2024-08-20 VITALS — BP 123/85 | HR 86 | Temp 97.0°F | Resp 18 | Ht 66.0 in | Wt 180.0 lb

## 2024-08-20 DIAGNOSIS — Z79899 Other long term (current) drug therapy: Secondary | ICD-10-CM | POA: Insufficient documentation

## 2024-08-20 DIAGNOSIS — M961 Postlaminectomy syndrome, not elsewhere classified: Secondary | ICD-10-CM | POA: Insufficient documentation

## 2024-08-20 DIAGNOSIS — G894 Chronic pain syndrome: Secondary | ICD-10-CM | POA: Insufficient documentation

## 2024-08-20 DIAGNOSIS — M5416 Radiculopathy, lumbar region: Secondary | ICD-10-CM | POA: Insufficient documentation

## 2024-08-20 MED ORDER — TRAMADOL HCL 50 MG PO TABS: 50.0000 mg | ORAL_TABLET | Freq: Three times a day (TID) | ORAL | 2 refills | Status: DC | PRN

## 2024-08-20 NOTE — Progress Notes (Signed)
 Nursing Pain Medication Assessment:  Safety precautions to be maintained throughout the outpatient stay will include: orient to surroundings, keep bed in low position, maintain call bell within reach at all times, provide assistance with transfer out of bed and ambulation.  Medication Inspection Compliance: Pill count conducted under aseptic conditions, in front of the patient. Neither the pills nor the bottle was removed from the patient's sight at any time. Once count was completed pills were immediately returned to the patient in their original bottle.  Medication: Tramadol  (Ultram ) Pill/Patch Count: 16 of 60 pills/patches remain Pill/Patch Appearance: Markings consistent with prescribed medication Bottle Appearance: Standard pharmacy container. Clearly labeled. Filled Date: 08 / 25 / 2025 Last Medication intake:  Today

## 2024-08-20 NOTE — Progress Notes (Signed)
 PROVIDER NOTE: Interpretation of information contained herein should be left to medically-trained personnel. Specific patient instructions are provided elsewhere under Patient Instructions section of medical record. This document was created in part using AI and STT-dictation technology, any transcriptional errors that may result from this process are unintentional.  Patient: Rachel [name]  Service: E/M   PCP: Derick Leita MARLA, MD  DOB: December 30, 1955  DOS: 08/20/2024  Provider: Emmy MARLA Blanch, NP  MRN: [id number]  Delivery: Face-to-face  Specialty: Interventional Pain Management  Type: Established Patient  Setting: Ambulatory outpatient facility  Specialty designation: 09  Referring Prov.: Derick Leita MARLA, MD  Location: Outpatient office facility       History of present illness (HPI) [name], a 68 y.o. year old female, is here today because of her Lumbar post-laminectomy syndrome [M96.1]. Rachel [name] primary complain today is Back Pain (Lower Back, pain radiating in to bilateral legs and feet and toes)  Pertinent problems: Rachel [name] has Essential hypertension; Chest pain with moderate risk for cardiac etiology; Rapid heart beat; Midsternal chest pain; Vertigo; Hypokalemia; Rectal mass; Coronary artery disease, non-occlusive; Cerebrovascular accident (HCC); Lumbar post-laminectomy syndrome; Lumbar radicular pain; and Chronic pain syndrome on their pertinent problem list.   Pain Assessment: Severity of Chronic pain is reported as a 4 /10. Location: Back Lower/Bilateral legs, feet and toes. Onset: More than a month ago. Quality: Aching. Timing: Intermittent. Modifying factor(s): Rest. Vitals:  height is 5' 6 (1.676 m) and weight is 180 lb (81.6 kg). Her temporal temperature is 97 F (36.1 C) (abnormal). Her blood pressure is 123/85 and her pulse is 86. Her respiration is 18 and oxygen saturation is 95%.  BMI: Estimated body mass index is 29.05 kg/m as calculated from the following:   Height as  of this encounter: 5' 6 (1.676 m).   Weight as of this encounter: 180 lb (81.6 kg).  Last encounter: 05/20/2024. Last procedure: Visit date not found.  Reason for encounter: medication management. No change in medical history since last visit.  Patient's pain is at baseline.  Patient continues multimodal pain regimen as prescribed.  States that it provides pain relief and improvement in functional status. The patient reports taking tramadol  50 mg up to 3 times due to pain and requests an increase in the dose.  I discussed with the patient that I will increase the frequency of the tramadol  administration but emphasized that this should be taken on an as needed basis.  Pharmacotherapy Assessment   Analgesic: Tramadol  (Ultram ) 50 mg tablet every 8 hours as needed for severe pain. MME=30 Monitoring: [place] PMP: PDMP reviewed during this encounter.       Pharmacotherapy: No side-effects or adverse reactions reported. Compliance: No problems identified. Effectiveness: Clinically acceptable.  Rachel [name], [place]  08/20/2024  8:44 AM  Sign when Signing Visit Nursing Pain Medication Assessment:  Safety precautions to be maintained throughout the outpatient stay will include: orient to surroundings, keep bed in low position, maintain call bell within reach at all times, provide assistance with transfer out of bed and ambulation.  Medication Inspection Compliance: Pill count conducted under aseptic conditions, in front of the patient. Neither the pills nor the bottle was removed from the patient's sight at any time. Once count was completed pills were immediately returned to the patient in their original bottle.  Medication: Tramadol  (Ultram ) Pill/Patch Count: 16 of 60 pills/patches remain Pill/Patch Appearance: Markings consistent with prescribed medication Bottle Appearance: Standard pharmacy container. Clearly labeled. Filled Date: 08 /  25 / 2025 Last Medication intake:  Today    UDS:  Summary   Date Value Ref Range Status  06/26/2023 Note  Final    Comment:    ==================================================================== Compliance Drug Analysis, Ur ==================================================================== Specimen Alert Not Detected result may be consistent with the time of last use noted for this medication. AS NEEDED (Hydrocodone ) ==================================================================== Test                             Result       Flag       Units  Drug Present and Declared for Prescription Verification   Venlafaxine                     PRESENT      EXPECTED   Desmethylvenlafaxine           PRESENT      EXPECTED    Desmethylvenlafaxine is an expected metabolite of venlafaxine .    Acetaminophen                   PRESENT      EXPECTED  Drug Absent but Declared for Prescription Verification   Hydrocodone                     Not Detected UNEXPECTED ng/mg creat   Pregabalin                      Not Detected UNEXPECTED ==================================================================== Test                      Result    Flag   Units      Ref Range   Creatinine              78               mg/dL      >=79 ==================================================================== Declared Medications:  The flagging and interpretation on this report are based on the  following declared medications.  Unexpected results may arise from  inaccuracies in the declared medications.   **Note: The testing scope of this panel includes these medications:   Hydrocodone  (Norco)  Pregabalin  (Lyrica )  Venlafaxine  (Effexor )   **Note: The testing scope of this panel does not include small to  moderate amounts of these reported medications:   Acetaminophen  (Tylenol )  Acetaminophen  (Norco)   **Note: The testing scope of this panel does not include the  following reported medications:   Amlodipine  (Norvasc )  Atorvastatin  (Lipitor)  Nitroglycerin  (Nitrostat )   Omeprazole (Prilosec)  Solifenacin (Vesicare) ==================================================================== For clinical consultation, please call [phone]. ====================================================================     No results found for: CBDTHCR No results found for: D8THCCBX No results found for: D9THCCBX  ROS  Constitutional: Denies any fever or chills Gastrointestinal: No reported hemesis, hematochezia, vomiting, or acute GI distress Musculoskeletal: Denies any acute onset joint swelling, redness, loss of ROM, or weakness Neurological: No reported episodes of acute onset apraxia, aphasia, dysarthria, agnosia, amnesia, paralysis, loss of coordination, or loss of consciousness  Medication Review  acetaminophen , amLODipine , atorvastatin , nitroGLYCERIN , omeprazole, solifenacin, traMADol , and venlafaxine   History Review  Allergy: Rachel [name] is allergic to prednisone. Drug: Rachel [name]  reports no history of drug use. Alcohol:  reports no history of alcohol use. Tobacco:  reports that she has been smoking cigarettes. She has never used smokeless tobacco. Social: Rachel [name]  reports that she has been smoking cigarettes.  She has never used smokeless tobacco. She reports that she does not drink alcohol and does not use drugs. Medical:  has a past medical history of Anginal pain (HCC), Arthritis, Body mass index (BMI) of 24.0-24.9 in adult, Cardiovascular disease, Colostomy present (HCC), Current every day smoker, Depression, GERD (gastroesophageal reflux disease), Headache, History of rectal cancer, History of UTI, Hypertension, Midsternal chest pain, Personal history of chemotherapy (2017), Personal history of radiation therapy (2017), Port-A-Cath in place, Rectal cancer (HCC) (2017), Stroke [facility]), Syncope and collapse, and Thyroid  nodule (2018). Surgical: Ms. Pitter  has a past surgical history that includes Partial hysterectomy; ERCP; Colonoscopy with propofol   (N/A, 08/15/2016); Portacath placement (N/A, 08/18/2016); Colonoscopy with propofol  (N/A, 11/08/2016); laparoscopy (N/A, 11/23/2016); Colostomy (N/A, 11/23/2016); Biopsy thyroid  (Right, 11/16/2016); Abdominal perineal bowel resection (N/A, 03/16/2017); Appendectomy (03/16/2017); Thyroid  lobectomy (Right, 11/05/2017); Incision and drainage perirectal abscess (N/A, 04/15/2018); Back surgery (2000); Abdominal hysterectomy (1999); Cholecystectomy (1983); Colonoscopy with propofol  (N/A, 08/20/2020); Colon surgery; Colonoscopy with propofol  (N/A, 03/21/2023); and Pulse generator implant (N/A, 08/28/2023). Family: family history includes Breast cancer (age of onset: 22) in her mother; Heart disease in her paternal grandmother; Hyperlipidemia in her father; Prostate cancer in her brother; Stroke in her maternal grandfather.  Laboratory Chemistry Profile   Renal Lab Results  Component Value Date   BUN 12 08/20/2023   CREATININE 0.68 08/20/2023   BCR 16 03/08/2017   GFRAA >60 08/29/2019   GFRNONAA >60 08/20/2023    Hepatic Lab Results  Component Value Date   AST 20 08/29/2019   ALT 14 08/29/2019   ALBUMIN 3.7 08/29/2019   ALKPHOS 131 (H) 08/29/2019   LIPASE 174 07/22/2014    Electrolytes Lab Results  Component Value Date   NA 141 08/20/2023   K 3.7 08/20/2023   CL 99 08/20/2023   CALCIUM  9.2 08/20/2023   MG 2.1 06/11/2019    Bone No results found for: VD25OH, VD125OH2TOT, CI6874NY7, CI7874NY7, 25OHVITD1, 25OHVITD2, 25OHVITD3, TESTOFREE, TESTOSTERONE  Inflammation (CRP: Acute Phase) (ESR: Chronic Phase) No results found for: CRP, ESRSEDRATE, LATICACIDVEN       Note: Above Lab results reviewed.  Recent Imaging Review  DG Lumbar Spine 2-3 Views CLINICAL DATA:  Elective surgery.  EXAM: LUMBAR SPINE - 2-3 VIEW  COMPARISON:  Thoracic MRI 05/20/2023  FINDINGS: Two fluoroscopic spot views of the thoracolumbar spine obtained in the operating room. Spinal stimulator  with lead tips at the T7 level. Fluoroscopy time 6 minutes 15 seconds. Dose is 65 mGy.  IMPRESSION: Intraoperative fluoroscopy.  Spinal stimulator in place.  Electronically Signed   By: Andrea Gasman M.D.   On: 08/28/2023 14:00 DG C-Arm 1-60 Min-No Report Fluoroscopy was utilized by the requesting physician.  No radiographic  interpretation.  Note: Reviewed        Physical Exam  Vitals: BP 123/85 (BP Location: Right Arm, Patient Position: Sitting)   Pulse 86   Temp (!) 97 F (36.1 C) (Temporal)   Resp 18   Ht 5' 6 (1.676 m)   Wt 180 lb (81.6 kg)   SpO2 95%   BMI 29.05 kg/m  BMI: Estimated body mass index is 29.05 kg/m as calculated from the following:   Height as of this encounter: 5' 6 (1.676 m).   Weight as of this encounter: 180 lb (81.6 kg). Ideal: Ideal body weight: 59.3 kg (130 lb 11.7 oz) Adjusted ideal body weight: 68.2 kg (150 lb 7 oz) General appearance: Well nourished, well developed, and well hydrated. In no apparent acute distress Mental status:  Alert, oriented x 3 (person, place, & time)       Respiratory: No evidence of acute respiratory distress Eyes: PERLA   Assessment   Diagnosis Status  1. Lumbar post-laminectomy syndrome   2. Failed back surgical syndrome   3. Lumbar radicular pain   4. Chronic pain syndrome   5. Medication management    Controlled Controlled Controlled   Updated Problems: No problems updated.  Plan of Care  Problem-specific:  Assessment and Plan We will continue on current medication regimen. Prescribing drug monitoring (PDMP) reviewed; findings consistent with the use of prescribed medication and no evidence of narcotic misuse or abuse. Urine drug screening (UDS) up-to-date. No other new issues or problems reported to this visit. Schedule follow-up in 90 days for medication management.   Rachel [name] has a current medication list which includes the following long-term medication(s): nitroglycerin  and  venlafaxine .  Pharmacotherapy (Medications Ordered): Meds ordered this encounter  Medications   traMADol  (ULTRAM ) 50 MG tablet    Sig: Take 1 tablet (50 mg total) by mouth 3 (three) times daily as needed.    Dispense:  90 tablet    Refill:  2    Fill one day early if pharmacy is closed on scheduled refill date. Do not fill until: To last until:   Orders:  No orders of the defined types were placed in this encounter.       Return in about 3 months (around 11/19/2024) for (F2F), (MM), Emmy Blanch NP.    Recent Visits No visits were found meeting these conditions. Showing recent visits within past 90 days and meeting all other requirements Today's Visits Date Type Provider Dept  08/20/24 Office Visit Zuriel Roskos K, NP [facility]  Showing today's visits and meeting all other requirements Future Appointments Date Type Provider Dept  11/12/24 Appointment Natallia Stellmach K, NP [facility]  Showing future appointments within next 90 days and meeting all other requirements  I discussed the assessment and treatment plan with the patient. The patient was provided an opportunity to ask questions and all were answered. The patient agreed with the plan and demonstrated an understanding of the instructions.  Patient advised to call back or seek an in-person evaluation if the symptoms or condition worsens.  Duration of encounter: 30 minutes.  Total time on encounter, as per AMA guidelines included both the face-to-face and non-face-to-face time personally spent by the physician and/or other qualified health care professional(s) on the day of the encounter (includes time in activities that require the physician or other qualified health care professional and does not include time in activities normally performed by clinical staff). Physician's time may include the following activities when performed: Preparing to see the patient (e.g., pre-charting review of records, searching  for previously ordered imaging, lab work, and nerve conduction tests) Review of prior analgesic pharmacotherapies. Reviewing PMP Interpreting ordered tests (e.g., lab work, imaging, nerve conduction tests) Performing post-procedure evaluations, including interpretation of diagnostic procedures Obtaining and/or reviewing separately obtained history Performing a medically appropriate examination and/or evaluation Counseling and educating the patient/family/caregiver Ordering medications, tests, or procedures Referring and communicating with other health care professionals (when not separately reported) Documenting clinical information in the electronic or other health record Independently interpreting results (not separately reported) and communicating results to the patient/ family/caregiver Care coordination (not separately reported)  Note by: Makye Radle K Carys Malina, NP (TTS and AI technology used. I apologize for any typographical errors that were not detected and corrected.) Date:  08/20/2024; Time: 9:33 AM

## 2024-10-22 ENCOUNTER — Other Ambulatory Visit: Payer: Self-pay | Admitting: Family Medicine

## 2024-10-22 DIAGNOSIS — N63 Unspecified lump in unspecified breast: Secondary | ICD-10-CM

## 2024-10-24 ENCOUNTER — Ambulatory Visit
Admission: RE | Admit: 2024-10-24 | Discharge: 2024-10-24 | Disposition: A | Discharge status: Home / Self Care | Source: Ambulatory Visit | Attending: Family Medicine | Admitting: Family Medicine

## 2024-10-24 ENCOUNTER — Encounter: Payer: Self-pay | Admitting: *Deleted

## 2024-10-24 DIAGNOSIS — R92323 Mammographic fibroglandular density, bilateral breasts: Secondary | ICD-10-CM | POA: Diagnosis not present

## 2024-10-24 DIAGNOSIS — N63 Unspecified lump in unspecified breast: Secondary | ICD-10-CM | POA: Insufficient documentation

## 2024-10-24 DIAGNOSIS — N6310 Unspecified lump in the right breast, unspecified quadrant: Secondary | ICD-10-CM | POA: Diagnosis present

## 2024-11-12 ENCOUNTER — Ambulatory Visit: Attending: Nurse Practitioner | Admitting: Nurse Practitioner

## 2024-11-12 ENCOUNTER — Encounter: Payer: Self-pay | Admitting: Nurse Practitioner

## 2024-11-12 DIAGNOSIS — M961 Postlaminectomy syndrome, not elsewhere classified: Secondary | ICD-10-CM | POA: Insufficient documentation

## 2024-11-12 DIAGNOSIS — Z79899 Other long term (current) drug therapy: Secondary | ICD-10-CM | POA: Insufficient documentation

## 2024-11-12 DIAGNOSIS — M5416 Radiculopathy, lumbar region: Secondary | ICD-10-CM | POA: Insufficient documentation

## 2024-11-12 DIAGNOSIS — G894 Chronic pain syndrome: Secondary | ICD-10-CM | POA: Insufficient documentation

## 2024-11-12 MED ORDER — TRAMADOL HCL 50 MG PO TABS: 50.0000 mg | ORAL_TABLET | Freq: Three times a day (TID) | ORAL | 2 refills | Status: AC | PRN

## 2024-11-12 NOTE — Progress Notes (Signed)
 PROVIDER NOTE: Interpretation of information contained herein should be left to medically-trained personnel. Specific patient instructions are provided elsewhere under Patient Instructions section of medical record. This document was created in part using AI and STT-dictation technology, any transcriptional errors that may result from this process are unintentional.  Patient: Rachel [name]  Service: E/M   PCP: Derick Leita MARLA, MD  DOB: 06/08/1956  DOS: 11/12/2024  Provider: Emmy MARLA Blanch, NP  MRN: [id number]  Delivery: Face-to-face  Specialty: Interventional Pain Management  Type: Established Patient  Setting: Ambulatory outpatient facility  Specialty designation: 09  Referring Prov.: Derick Leita MARLA, MD  Location: Outpatient office facility       History of present illness (HPI) Rachel [name], a 68 y.o. year old female, is here today because of her back pain. 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  Pain Assessment: Severity of Chronic pain is reported as a 4 /10. Location: Back Lower/To bilateral legs. Onset: More than a month ago. Quality: Pressure, Sharp, Crushing. Timing: Intermittent. Modifying factor(s): Rest,  Tramadol . Vitals:  height is 5' 6 (1.676 m) and weight is 185 lb (83.9 kg). Her temporal temperature is 98.6 F (37 C). Her blood pressure is 134/80 and her pulse is 81. Her oxygen saturation is 97%.  BMI: Estimated body mass index is 29.86 kg/m as calculated from the following:   Height as of this encounter: 5' 6 (1.676 m).   Weight as of this encounter: 185 lb (83.9 kg).  Last encounter: 08/20/2024. Last procedure: Visit date not found.  Reason for encounter: medication management. No change in medical history since last visit.  Patient's pain is at baseline.  Patient continues multimodal pain regimen as prescribed.  States that it provides pain relief and improvement in functional status.   Discussed the use of AI scribe software for clinical note transcription with the patient, who gave verbal consent to proceed.  History of Present Illness   Rachel [name] is a 68 year old female with back surgical syndrome who presents with persistent lower back and leg pain.  She experiences persistent pain in the lower back that radiates down both legs, primarily on the posterior side. This pain has continued despite previous interventions.  She has a spinal cord stimulator (SCS) implanted, specifically a [employer] device, which was placed approximately one year ago. Despite the presence of the SCS, she continues to experience pain. The device requires charging, and she has been in contact with a representative for potential adjustments.  She is currently taking tramadol  three times daily as needed, which helps to take the edge off the pain but does not completely alleviate it. She has a prescription for tramadol  with two refills.     Pharmacotherapy Assessment   Analgesic: Tramadol  (Ultram ) 50 mg tablet every 8 hours as needed for severe pain. MME=30 Monitoring: [place] PMP: PDMP reviewed during this encounter.  Pharmacotherapy: No side-effects or adverse reactions reported. Compliance: No  problems identified. Effectiveness: Clinically acceptable.  Erlene Doyal SAUNDERS, [place]  11/12/2024  8:51 AM  Sign when Signing Visit Nursing Pain Medication Assessment:  Safety precautions to be maintained throughout the outpatient stay will include: orient to surroundings, keep bed in low position, maintain call bell within reach at all times, provide assistance with transfer out of bed and ambulation.  Medication Inspection Compliance: Pill count conducted under aseptic conditions, in front of the patient. Neither the pills nor the bottle was removed from the patient's sight at any time. Once count was completed pills were immediately returned to the patient in their original bottle.  Medication: Tramadol  (Ultram ) Pill/Patch Count: 0 of 90 pills/patches remain Pill/Patch Appearance: Markings consistent with prescribed medication Bottle Appearance: Standard pharmacy container. Clearly labeled. Filled Date: 35 / 26 / 2025 Last Medication intake:  Today    UDS:  Summary  Date Value Ref Range Status  06/26/2023 Note  Final    Comment:    ==================================================================== Compliance Drug Analysis, Ur ==================================================================== Specimen Alert Not Detected result may be consistent with the time of last use noted for this medication. AS NEEDED (Hydrocodone ) ==================================================================== Test                             Result       Flag       Units  Drug Present and Declared for Prescription Verification   Venlafaxine                     PRESENT      EXPECTED   Desmethylvenlafaxine           PRESENT      EXPECTED    Desmethylvenlafaxine is an expected metabolite of venlafaxine .    Acetaminophen                   PRESENT      EXPECTED  Drug Absent but Declared for Prescription Verification   Hydrocodone                     Not Detected UNEXPECTED ng/mg creat   Pregabalin                       Not Detected UNEXPECTED ==================================================================== Test                      Result    Flag   Units      Ref Range   Creatinine              78               mg/dL      >=79 ==================================================================== Declared Medications:  The flagging and interpretation on this report are based on the  following declared medications.  Unexpected results may arise from  inaccuracies in the declared medications.   **Note: The testing scope of this panel includes these medications:   Hydrocodone  (Norco)  Pregabalin  (Lyrica )  Venlafaxine  (Effexor )   **Note: The testing scope of this panel does not include small to  moderate amounts of these reported medications:   Acetaminophen  (Tylenol )  Acetaminophen  (Norco)   **Note: The testing scope of this panel does not include the  following reported medications:   Amlodipine  (Norvasc )  Atorvastatin  (Lipitor)  Nitroglycerin  (Nitrostat )  Omeprazole (Prilosec)  Solifenacin (Vesicare) ==================================================================== For clinical consultation,  please call [phone]. ====================================================================     No results found for: CBDTHCR No results found for: D8THCCBX No results found for: D9THCCBX  ROS  Constitutional: Denies any fever or chills Gastrointestinal: No reported hemesis, hematochezia, vomiting, or acute GI distress Musculoskeletal: Low back pain Neurological: No reported episodes of acute onset apraxia, aphasia, dysarthria, agnosia, amnesia, paralysis, loss of coordination, or loss of consciousness  Medication Review  amLODipine , atorvastatin , nitroGLYCERIN , omeprazole, solifenacin, traMADol , and venlafaxine   History Review  Allergy: Rachel [name] is allergic to prednisone. Drug: Rachel [name]  reports no history of drug use. Alcohol:  reports no history of alcohol  use. Tobacco:  reports that she has been smoking cigarettes. She has never used smokeless tobacco. Social: Rachel [name]  reports that she has been smoking cigarettes. She has never used smokeless tobacco. She reports that she does not drink alcohol and does not use drugs. Medical:  has a past medical history of Anginal pain, Arthritis, Body mass index (BMI) of 24.0-24.9 in adult, Cardiovascular disease, Colostomy present (HCC), Current every day smoker, Depression, GERD (gastroesophageal reflux disease), Headache, History of rectal cancer, History of UTI, Hypertension, Midsternal chest pain, Personal history of chemotherapy (2017), Personal history of radiation therapy (2017), Port-A-Cath in place, Rectal cancer (HCC) (2017), Stroke [facility]), Syncope and collapse, and Thyroid  nodule (2018). Surgical: Rachel [name]  has a past surgical history that includes Partial hysterectomy; ERCP; Colonoscopy with propofol  (N/A, 08/15/2016); Portacath placement (N/A, 08/18/2016); Colonoscopy with propofol  (N/A, 11/08/2016); laparoscopy (N/A, 11/23/2016); Colostomy (N/A, 11/23/2016); Biopsy thyroid  (Right, 11/16/2016); Abdominal perineal bowel resection (N/A, 03/16/2017); Appendectomy (03/16/2017); Thyroid  lobectomy (Right, 11/05/2017); Incision and drainage perirectal abscess (N/A, 04/15/2018); Back surgery (2000); Abdominal hysterectomy (1999); Cholecystectomy (1983); Colonoscopy with propofol  (N/A, 08/20/2020); Colon surgery; Colonoscopy with propofol  (N/A, 03/21/2023); and Pulse generator implant (N/A, 08/28/2023). Family: family history includes Breast cancer (age of onset: 47) in her mother; Heart disease in her paternal grandmother; Hyperlipidemia in her father; Prostate cancer in her brother; Stroke in her maternal grandfather.  Laboratory Chemistry Profile   Renal Lab Results  Component Value Date   BUN 12 08/20/2023   CREATININE 0.68 08/20/2023   BCR 16 03/08/2017   GFRAA >60 08/29/2019   GFRNONAA >60 08/20/2023     Hepatic Lab Results  Component Value Date   AST 20 08/29/2019   ALT 14 08/29/2019   ALBUMIN 3.7 08/29/2019   ALKPHOS 131 (H) 08/29/2019   LIPASE 174 07/22/2014    Electrolytes Lab Results  Component Value Date   NA 141 08/20/2023   K 3.7 08/20/2023   CL 99 08/20/2023   CALCIUM  9.2 08/20/2023   MG 2.1 06/11/2019    Bone No results found for: VD25OH, VD125OH2TOT, CI6874NY7, CI7874NY7, 25OHVITD1, 25OHVITD2, 25OHVITD3, TESTOFREE, TESTOSTERONE  Inflammation (CRP: Acute Phase) (ESR: Chronic Phase) No results found for: CRP, ESRSEDRATE, LATICACIDVEN       Note: Above Lab results reviewed.  Recent Imaging Review  US  LIMITED ULTRASOUND INCLUDING AXILLA RIGHT BREAST CLINICAL DATA:  Two year follow-up of probably benign RIGHT breast mass first described in July 2023. The patient presented for a delayed six-month follow-up in April of 2024 (ultrasound only), and did not present for any further follow-up until today. Due for annual. No issues.  EXAM: DIGITAL DIAGNOSTIC BILATERAL MAMMOGRAM WITH TOMOSYNTHESIS AND CAD; ULTRASOUND RIGHT BREAST LIMITED  TECHNIQUE: Bilateral digital diagnostic mammography and breast tomosynthesis was performed. The images were evaluated with computer-aided detection. ; Targeted ultrasound examination of the right breast was performed  COMPARISON:  Previous exam(s).  ACR Breast Density Category b: There are scattered areas of fibroglandular density.  FINDINGS: There is no mammographic evidence of malignancy in EITHER breast. BILATERAL round and oval circumscribed masses, a characteristically benign finding. These likely reflect either cysts or fibroadenomas. This finding is similar to the prior exams.  The mass originally described in July 2023 is no longer visualized and has likely resolved. Ultrasound was performed for confirmation.  Targeted ultrasound of the LATERAL RIGHT breast was performed at the site of the  previously imaged probably benign finding. No residual abnormality is noted. Representative pictures were taken at the 9:30 position 6 cm from nipple at the site of the resolved abnormality.  IMPRESSION: Interval resolution of the RIGHT breast mass described as probably benign. There are no mammographic or sonographic findings suspicious for malignancy.  RECOMMENDATION: Patient may return to routine screening mammogram in 1 year.(Code:SM-B-01Y)  I have discussed the findings and recommendations with the patient. If applicable, a reminder letter will be sent to the patient regarding the next appointment.  BI-RADS CATEGORY  2: Benign.  Electronically Signed   By: Norleen Croak M.D.   On: 10/24/2024 13:00 MM 3D DIAGNOSTIC MAMMOGRAM BILATERAL BREAST CLINICAL DATA:  Two year follow-up of probably benign RIGHT breast mass first described in July 2023. The patient presented for a delayed six-month follow-up in April of 2024 (ultrasound only), and did not present for any further follow-up until today. Due for annual. No issues.  EXAM: DIGITAL DIAGNOSTIC BILATERAL MAMMOGRAM WITH TOMOSYNTHESIS AND CAD; ULTRASOUND RIGHT BREAST LIMITED  TECHNIQUE: Bilateral digital diagnostic mammography and breast tomosynthesis was performed. The images were evaluated with computer-aided detection. ; Targeted ultrasound examination of the right breast was performed  COMPARISON:  Previous exam(s).  ACR Breast Density Category b: There are scattered areas of fibroglandular density.  FINDINGS: There is no mammographic evidence of malignancy in EITHER breast. BILATERAL round and oval circumscribed masses, a characteristically benign finding. These likely reflect either cysts or fibroadenomas. This finding is similar to the prior exams.  The mass originally described in July 2023 is no longer visualized and has likely resolved. Ultrasound was performed for confirmation.  Targeted ultrasound of the  LATERAL RIGHT breast was performed at the site of the previously imaged probably benign finding. No residual abnormality is noted. Representative pictures were taken at the 9:30 position 6 cm from nipple at the site of the resolved abnormality.  IMPRESSION: Interval resolution of the RIGHT breast mass described as probably benign. There are no mammographic or sonographic findings suspicious for malignancy.  RECOMMENDATION: Patient may return to routine screening mammogram in 1 year.(Code:SM-B-01Y)  I have discussed the findings and recommendations with the patient. If applicable, a reminder letter will be sent to the patient regarding the next appointment.  BI-RADS CATEGORY  2: Benign.  Electronically Signed   By: Norleen Croak M.D.   On: 10/24/2024 13:00 Note: Reviewed        Physical Exam  Vitals: BP 134/80 (BP Location: Right Arm, Patient Position: Sitting)   Pulse 81   Temp 98.6 F (37 C) (Temporal)   Ht 5' 6 (1.676 m)   Wt 185 lb (83.9 kg)   SpO2 97%   BMI 29.86 kg/m  BMI: Estimated body mass index is 29.86 kg/m as calculated from the following:   Height as of this encounter: 5' 6 (1.676 m).   Weight as of this encounter: 185 lb (83.9 kg). Ideal: Ideal body weight: 59.3 kg (130 lb 11.7 oz) Adjusted ideal body  weight: 69.1 kg (152 lb 7 oz) General appearance: Well nourished, well developed, and well hydrated. In no apparent acute distress Mental status: Alert, oriented x 3 (person, place, & time)       Respiratory: No evidence of acute respiratory distress Eyes: PERLA  Musculoskeletal: +LBP Assessment   Diagnosis Status  1. Lumbar post-laminectomy syndrome   2. Medication management   3. Chronic pain syndrome   4. Failed back surgical syndrome   5. Lumbar radicular pain    Controlled Controlled Controlled   Updated Problems: Problem  Medication Management  Chronic Pain Syndrome    Plan of Care  Problem-specific:  Assessment and Plan    Lumbar  post-laminectomy syndrome with radiculopathy Chronic lower back pain with bilateral leg radiculopathy persists despite SCS placement. SCS functioning but inadequate relief suggests possible battery malfunction or need for readjustment. - Contact [employer] representative for SCS battery check and potential readjustment. - Continue tramadol  three times daily as needed for pain management.  Medication management Tramadol  provides partial relief, reducing pain to a manageable level. - Sent tramadol  prescription with two refills to pharmacy. - Ensured pharmacy is aware of the new prescription to avoid confusion with existing refills.  Patient's pain is controlled with tramadol , will continue on current medication regimen.  Prescribing drug monitoring (PDMP) reviewed, findings consistent with the use of prescribed medication and no evidence of narcotic misuse or abuse. Routine UDS ordered today.  No side effects or adverse reaction reported to medication.  Schedule follow-up in 90 days for medication management.      Rachel [name] has a current medication list which includes the following long-term medication(s): nitroglycerin  and venlafaxine .  Pharmacotherapy (Medications Ordered): Meds ordered this encounter  Medications   traMADol  (ULTRAM ) 50 MG tablet    Sig: Take 1 tablet (50 mg total) by mouth 3 (three) times daily as needed.    Dispense:  90 tablet    Refill:  2    Fill one day early if pharmacy is closed on scheduled refill date. Do not fill until: To last until:   Orders:  Orders Placed This Encounter  Procedures   ToxASSURE Select 13 (MW), Urine    Volume: 30 ml(s). Minimum 3 ml of urine is needed. Document temperature of fresh sample. Indications: Long term (current) use of opiate analgesic (S20.108)    Release to patient:   Immediate    Return in about 3 months (around 02/10/2025) for (F2F), (MM), Emmy Blanch NP.    Recent Visits Date Type Provider Dept  08/20/24  Office Visit Moya Duan K, NP [facility]  Showing recent visits within past 90 days and meeting all other requirements Today's Visits Date Type Provider Dept  11/12/24 Office Visit Rockwell Zentz K, NP [facility]  Showing today's visits and meeting all other requirements Future Appointments Date Type Provider Dept  02/05/25 Appointment Prudy Candy K, NP [facility]  Showing future appointments within next 90 days and meeting all other requirements  I discussed the assessment and treatment plan with the patient. The patient was provided an opportunity to ask questions and all were answered. The patient agreed with the plan and demonstrated an understanding of the instructions.  Patient advised to call back or seek an in-person evaluation if the symptoms or condition worsens.  I personally spent a total of 30 minutes in the care of the patient today including preparing to see the patient, getting/reviewing separately obtained history, performing a medically appropriate exam/evaluation,  counseling and educating, placing orders, referring and communicating with other health care professionals, documenting clinical information in the EHR, independently interpreting results, communicating results, and coordinating care.  Note by: Monteen Toops K Ellianne Gowen, NP (TTS and AI technology used. I apologize for any typographical errors that were not detected and corrected.) Date: 11/12/2024; Time: 10:43 AM

## 2024-11-12 NOTE — Progress Notes (Signed)
 Nursing Pain Medication Assessment:  Safety precautions to be maintained throughout the outpatient stay will include: orient to surroundings, keep bed in low position, maintain call bell within reach at all times, provide assistance with transfer out of bed and ambulation.  Medication Inspection Compliance: Pill count conducted under aseptic conditions, in front of the patient. Neither the pills nor the bottle was removed from the patient's sight at any time. Once count was completed pills were immediately returned to the patient in their original bottle.  Medication: Tramadol  (Ultram ) Pill/Patch Count: 0 of 90 pills/patches remain Pill/Patch Appearance: Markings consistent with prescribed medication Bottle Appearance: Standard pharmacy container. Clearly labeled. Filled Date: 26 / 26 / 2025 Last Medication intake:  Today

## 2024-11-15 LAB — TOXASSURE SELECT 13 (MW), URINE

## 2024-11-26 ENCOUNTER — Encounter: Payer: Self-pay | Admitting: Emergency Medicine

## 2025-02-05 ENCOUNTER — Encounter: Admitting: Nurse Practitioner
